# Patient Record
Sex: Male | Born: 1935 | Race: White | Hispanic: No | Marital: Married | Smoking: Never smoker
Health system: Southern US, Community
[De-identification: ages and names within clinical notes are randomized; demographics above are authoritative.]

## PROBLEM LIST (undated history)

## (undated) DIAGNOSIS — N529 Male erectile dysfunction, unspecified: Secondary | ICD-10-CM

## (undated) DIAGNOSIS — M519 Unspecified thoracic, thoracolumbar and lumbosacral intervertebral disc disorder: Secondary | ICD-10-CM

## (undated) DIAGNOSIS — I10 Essential (primary) hypertension: Secondary | ICD-10-CM

## (undated) DIAGNOSIS — I779 Disorder of arteries and arterioles, unspecified: Secondary | ICD-10-CM

## (undated) DIAGNOSIS — E119 Type 2 diabetes mellitus without complications: Secondary | ICD-10-CM

## (undated) DIAGNOSIS — I341 Nonrheumatic mitral (valve) prolapse: Secondary | ICD-10-CM

## (undated) DIAGNOSIS — I739 Peripheral vascular disease, unspecified: Secondary | ICD-10-CM

## (undated) DIAGNOSIS — E785 Hyperlipidemia, unspecified: Secondary | ICD-10-CM

## (undated) DIAGNOSIS — I340 Nonrheumatic mitral (valve) insufficiency: Principal | ICD-10-CM

## (undated) HISTORY — PX: CARDIAC CATHETERIZATION: SHX172

## (undated) HISTORY — DX: Unspecified thoracic, thoracolumbar and lumbosacral intervertebral disc disorder: M51.9

## (undated) HISTORY — DX: Hyperlipidemia, unspecified: E78.5

## (undated) HISTORY — DX: Nonrheumatic mitral (valve) insufficiency: I34.0

## (undated) HISTORY — DX: Peripheral vascular disease, unspecified: I73.9

## (undated) HISTORY — DX: Disorder of arteries and arterioles, unspecified: I77.9

## (undated) HISTORY — DX: Male erectile dysfunction, unspecified: N52.9

## (undated) HISTORY — DX: Essential (primary) hypertension: I10

## (undated) HISTORY — DX: Nonrheumatic mitral (valve) prolapse: I34.1

---

## 2004-01-20 ENCOUNTER — Ambulatory Visit (HOSPITAL_COMMUNITY): Admission: RE | Admit: 2004-01-20 | Discharge: 2004-01-20 | Payer: Self-pay | Admitting: General Surgery

## 2005-05-17 ENCOUNTER — Ambulatory Visit (HOSPITAL_COMMUNITY): Admission: RE | Admit: 2005-05-17 | Discharge: 2005-05-17 | Payer: Self-pay | Admitting: *Deleted

## 2008-10-08 ENCOUNTER — Encounter: Admission: RE | Admit: 2008-10-08 | Discharge: 2008-10-08 | Payer: Self-pay | Admitting: Internal Medicine

## 2010-09-30 NOTE — Op Note (Signed)
NAME:  Adrian Olson, AUTHEMENT NO.:  [id number]   MEDICAL RECORD NO.:  [id number]                   PATIENT TYPE:  OIB   LOCATION:  2889                                 FACILITY:  [facility]   PHYSICIAN:  Leonie Man, M.D.                DATE OF BIRTH:  18-May-1930   DATE OF PROCEDURE:  01/20/2004  DATE OF DISCHARGE:                                 OPERATIVE REPORT   PREOPERATIVE DIAGNOSIS:  Left inguinal hernia.   POSTOPERATIVE DIAGNOSIS:  Left inguinal hernia.   PROCEDURE:  Left inguinal herniorrhaphy with mesh.   SURGEON:  Leonie Man, M.D.   ASSISTANT:  Nurse.   ANESTHESIA:  General.   NOTE:  Mr. Eisenbeis is a 77 year old man presenting with a left-sided groin  bulge which easily reduces on standing.  He desires to have his hernia  fixed.  The risks and potential benefits of surgery have been discussed, all  questions answered, and consent obtained.   DESCRIPTION OF PROCEDURE:  Following induction of satisfactory general  anesthesia with the patient position supinely, the left groin is prepped and  draped including to be included in sterile operative field.  I infiltrated  the region of the lower abdominal crease with 0.5% Marcaine with  epinephrine.  A transverse incision is made in the lower abdominal crease,  leading through the skin and subcutaneous tissue, carrying dissection down  through the external oblique aponeurosis.  External oblique aponeurosis  opened up through the external inguinal ring.  The spermatic cord was then  elevated and held with a Penrose drain.  Dissection along the anteromedial  aspect of the spermatic cord did not show any evidence of an indirect  hernia.  A large direct hernia was noted and was dissected free from the  retroperitoneal structures.  There was no indirect sac noted on the  anteromedial aspect of the cord.  There was a large direct hernia noted.  This was repaired with patch of polypropylene mesh sewn in  the pubic  tubercle and carried up along the conjoin tendon with a 2-0 novofil suture  and again from the pubic tubercle up along the shelving edge of Poupart's  ligament with a 2-0 Novofil suture. The tails of the mesh were split so as  to allow protrusion of the cord.  The tails of the mesh then sutured into  the posterior behind the cord in the internal oblique muscles.  The hernia  repair was checked and noted to be strong and firm. Sponge, instruments, and  sharp counts were verified.  The external oblique aponeurosis closed over  the cord with a running 2-0 Vicryl suture.  Subcutaneous tissue closed with running 3-0 Vicryl suture, and skin closed  with running 4-0 Monocryl suture and then reinforced with Steri-Strips.  Sterile dressings applied.  Anesthetic reversed, patient removed from the  operating room to the recovery room in stable condition.  He  tolerated the  procedure well.                                               Leonie Man, M.D.    PB/MEDQ  D:  01/20/2004  T:  01/20/2004  Job:  [id number]   cc:   Janae Bridgeman. Eloise Harman., M.D.  [address]  [place]  [place] [address]  Fax: [phone]

## 2010-09-30 NOTE — Op Note (Signed)
NAMEMARCIAL, PLESS NO.:  [id number]   MEDICAL RECORD NO.:  [id number]          PATIENT TYPE:  AMB   LOCATION:  ENDO                         FACILITY:  [facility]   PHYSICIAN:  Georgiana Spinner, M.D.    DATE OF BIRTH:  06-May-1931   DATE OF PROCEDURE:  05/17/2005  DATE OF DISCHARGE:                                 OPERATIVE REPORT   PROCEDURE:  Colonoscopy.   INDICATIONS FOR PROCEDURE:  Colon cancer screening.   ANESTHESIA:  Demerol 70, Versed 7 mg.   DESCRIPTION OF PROCEDURE:  With the patient mildly sedated in the left  lateral decubitus position, a rectal exam was performed which revealed an  enlarged prostate without nodules. Subsequently, the Olympus videoscopic  colonoscope was inserted into the rectum and passed under direct vision to  the cecum identified by the ileocecal valve and base of the cecum both of  which were photographed. From this point, the colonoscope was slowly  withdrawn taking circumferential views of the colonic mucosa stopping only  in the rectum which appeared normal on direct and showed hemorrhoids on  retroflexed view. The endoscope was straightened and withdrawn. The  patient's vital signs and pulse oximeter remained stable. The patient  tolerated the procedure well without apparent complications.   FINDINGS:  Internal hemorrhoids, diverticulosis of sigmoid colon otherwise  an unremarkable colonoscopic examination to the cecum.   PLAN:  Consider repeat examination possibly in 5 years           ______________________________  Georgiana Spinner, M.D.     GMO/MEDQ  D:  05/17/2005  T:  05/17/2005  Job:  [id number]

## 2013-03-12 ENCOUNTER — Encounter (HOSPITAL_COMMUNITY): Payer: Self-pay | Admitting: Interventional Cardiology

## 2013-03-12 ENCOUNTER — Telehealth: Payer: Self-pay

## 2013-03-12 DIAGNOSIS — I340 Nonrheumatic mitral (valve) insufficiency: Secondary | ICD-10-CM

## 2013-03-12 NOTE — Telephone Encounter (Signed)
Message copied by Jarvis Newcomer on Wed Mar 12, 2013  4:19 PM ------      Message from: Verdis Prime      Created: Wed Mar 12, 2013  2:45 PM       No evidence of cardiac enlargement. Leaky valves is unchanged from prior study. He needs clinical followup in one year. He needs to have an echocardiogram done prior to the visit for mitral regurgitation ------

## 2013-03-12 NOTE — Telephone Encounter (Signed)
pt aware of echo No evidence of cardiac enlargement. Leaky valves is unchanged from prior study. repeat echo in 1 yr prior to 48yr f/u with Dr.Smith.pt verbalized understanding.

## 2013-03-12 NOTE — Telephone Encounter (Signed)
Message copied by Jarvis Newcomer on Wed Mar 12, 2013  4:15 PM ------      Message from: Verdis Prime      Created: Wed Mar 12, 2013  2:45 PM       No evidence of cardiac enlargement. Leaky valves is unchanged from prior study. He needs clinical followup in one year. He needs to have an echocardiogram done prior to the visit for mitral regurgitation ------

## 2013-05-28 ENCOUNTER — Other Ambulatory Visit: Payer: Self-pay | Admitting: Interventional Cardiology

## 2013-05-28 DIAGNOSIS — I6529 Occlusion and stenosis of unspecified carotid artery: Secondary | ICD-10-CM

## 2013-05-29 ENCOUNTER — Encounter: Payer: Self-pay | Admitting: Cardiology

## 2013-05-29 ENCOUNTER — Ambulatory Visit (HOSPITAL_COMMUNITY): Payer: Medicare Other | Attending: Cardiology

## 2013-05-29 DIAGNOSIS — I6529 Occlusion and stenosis of unspecified carotid artery: Secondary | ICD-10-CM | POA: Insufficient documentation

## 2013-05-29 DIAGNOSIS — I658 Occlusion and stenosis of other precerebral arteries: Secondary | ICD-10-CM | POA: Insufficient documentation

## 2013-05-29 DIAGNOSIS — E785 Hyperlipidemia, unspecified: Secondary | ICD-10-CM | POA: Insufficient documentation

## 2013-05-29 DIAGNOSIS — I10 Essential (primary) hypertension: Secondary | ICD-10-CM | POA: Insufficient documentation

## 2013-06-03 ENCOUNTER — Telehealth: Payer: Self-pay

## 2013-06-03 DIAGNOSIS — I6529 Occlusion and stenosis of unspecified carotid artery: Secondary | ICD-10-CM

## 2013-06-03 NOTE — Telephone Encounter (Signed)
pt given results of carotid dopplers.Moderate 60-75% stenoses bilateral. Slight increase since prior. Repeat 6 months.pt verbalzied understanding.

## 2013-06-03 NOTE — Telephone Encounter (Signed)
Message copied by Lamar Laundry on Tue Jun 03, 2013  2:51 PM ------      Message from: Daneen Schick      Created: Tue Jun 03, 2013  7:41 AM       Moderate 60-75% stenoses bilateral. Slight increase since prior. Repeat 6 months. ------

## 2013-06-11 ENCOUNTER — Encounter: Payer: Self-pay | Admitting: Interventional Cardiology

## 2013-12-04 ENCOUNTER — Ambulatory Visit (HOSPITAL_COMMUNITY): Payer: Medicare Other | Attending: Interventional Cardiology | Admitting: Cardiology

## 2013-12-04 DIAGNOSIS — E785 Hyperlipidemia, unspecified: Secondary | ICD-10-CM | POA: Insufficient documentation

## 2013-12-04 DIAGNOSIS — R7309 Other abnormal glucose: Secondary | ICD-10-CM | POA: Diagnosis not present

## 2013-12-04 DIAGNOSIS — I6529 Occlusion and stenosis of unspecified carotid artery: Secondary | ICD-10-CM | POA: Diagnosis not present

## 2013-12-04 DIAGNOSIS — I10 Essential (primary) hypertension: Secondary | ICD-10-CM | POA: Diagnosis not present

## 2013-12-04 NOTE — Progress Notes (Signed)
Carotid duplex performed 

## 2013-12-05 ENCOUNTER — Telehealth: Payer: Self-pay

## 2013-12-05 DIAGNOSIS — I6523 Occlusion and stenosis of bilateral carotid arteries: Secondary | ICD-10-CM

## 2013-12-05 NOTE — Telephone Encounter (Signed)
pt aware of carotid results.Stable bilateral carotid obstruction between 60 and 79%. It is recommended that carotid Doppler be repeated in 6 months. pt verbalized understanding.

## 2013-12-05 NOTE — Telephone Encounter (Signed)
Message copied by Lamar Laundry on Fri Dec 05, 2013  1:55 PM ------      Message from: Daneen Schick      Created: Fri Dec 05, 2013  1:12 PM       Stable bilateral carotid obstruction between 60 and 79%. It is recommended that carotid Doppler be repeated in 6 months ------

## 2013-12-09 ENCOUNTER — Encounter: Payer: Self-pay | Admitting: Interventional Cardiology

## 2013-12-27 ENCOUNTER — Encounter: Payer: Self-pay | Admitting: *Deleted

## 2014-03-10 ENCOUNTER — Ambulatory Visit (HOSPITAL_COMMUNITY): Payer: Medicare Other | Attending: Internal Medicine | Admitting: Radiology

## 2014-03-10 DIAGNOSIS — I340 Nonrheumatic mitral (valve) insufficiency: Secondary | ICD-10-CM | POA: Diagnosis not present

## 2014-03-10 NOTE — Progress Notes (Signed)
Echocardiogram performed.  

## 2014-03-16 ENCOUNTER — Encounter: Payer: Self-pay | Admitting: Interventional Cardiology

## 2014-03-16 ENCOUNTER — Ambulatory Visit (INDEPENDENT_AMBULATORY_CARE_PROVIDER_SITE_OTHER): Payer: Medicare Other | Admitting: Interventional Cardiology

## 2014-03-16 VITALS — BP 150/66 | HR 56 | Ht 68.0 in | Wt 149.0 lb

## 2014-03-16 DIAGNOSIS — I739 Peripheral vascular disease, unspecified: Secondary | ICD-10-CM

## 2014-03-16 DIAGNOSIS — I340 Nonrheumatic mitral (valve) insufficiency: Secondary | ICD-10-CM

## 2014-03-16 DIAGNOSIS — E785 Hyperlipidemia, unspecified: Secondary | ICD-10-CM

## 2014-03-16 DIAGNOSIS — N529 Male erectile dysfunction, unspecified: Secondary | ICD-10-CM | POA: Insufficient documentation

## 2014-03-16 DIAGNOSIS — I341 Nonrheumatic mitral (valve) prolapse: Secondary | ICD-10-CM

## 2014-03-16 DIAGNOSIS — I10 Essential (primary) hypertension: Secondary | ICD-10-CM

## 2014-03-16 DIAGNOSIS — I779 Disorder of arteries and arterioles, unspecified: Secondary | ICD-10-CM

## 2014-03-16 DIAGNOSIS — N5201 Erectile dysfunction due to arterial insufficiency: Secondary | ICD-10-CM

## 2014-03-16 HISTORY — DX: Nonrheumatic mitral (valve) insufficiency: I34.0

## 2014-03-16 NOTE — Progress Notes (Signed)
Patient ID: Adrian Olson, male   DOB: [birth year], 80 y.o.   MRN: [id number]    [address], [place]  [address] Phone: [phone] Fax:  [phone]  Date:  03/16/2014   ID:  Adrian Olson, DOB Jan 03, 1931, MRN [id number]  PCP:  [name], MD   ASSESSMENT:  1. Mitral valve prolapse with severe mitral regurgitation 2. Hypertension, essential, with borderline control 3. Asymptomatic bilateral carotid disease  PLAN:  1. Cautioned to call if fatigue, dyspnea on exertion, orthopnea, palpitations 2. 2-D Doppler echocardiogram 1 year   SUBJECTIVE: Adrian Olson is a 80 y.o. male was doing well. His only complaint is erectile dysfunction. He denies orthopnea, PND, exertional dyspnea, exertional fatigue, palpitations, syncope, chest pain, and peripheral edema. He has not had chills or fever.   Wt Readings from Last 3 Encounters:  03/16/14 149 lb (67.586 kg)     Past Medical History  Diagnosis Date  . Mitral valve prolapse     moderate to severe MR, LVEF 55% Echo 2010  . Bilateral carotid artery disease     Severe Left and moderate Right  . Hypertension   . Lumbar disc disease   . Hyperlipidemia   . ED (erectile dysfunction)     Current Outpatient Prescriptions  Medication Sig Dispense Refill  . aspirin 81 MG tablet Take 81 mg by mouth daily.    Jolyne Loa Grape-Goldenseal (BERBERINE COMPLEX PO) Take 400 mg by mouth daily.    . Cholecalciferol (VITAMIN D) 2000 UNITS tablet Take 2,000 Units by mouth daily.    . finasteride (PROSCAR) 5 MG tablet Take 5 mg by mouth daily.    . hydrochlorothiazide (MICROZIDE) 12.5 MG capsule Take 12.5 mg by mouth daily.    Marland Kitchen levothyroxine (SYNTHROID, LEVOTHROID) 75 MCG tablet Take 75 mcg by mouth daily before breakfast.    . pravastatin (PRAVACHOL) 20 MG tablet Take 20 mg by mouth daily.    . ramipril (ALTACE) 5 MG capsule Take 5 mg by mouth daily.    . tamsulosin (FLOMAX) 0.4 MG CAPS capsule Take 0.4 mg by  mouth.     No current facility-administered medications for this visit.    Allergies:   No Known Allergies  Social History:  The patient  reports that he has never smoked. He does not have any smokeless tobacco history on file. He reports that he does not drink alcohol or use illicit drugs.   ROS:  Please see the history of present illness.   Appetite is been stable. Medication compliance is advocated.   All other systems reviewed and negative.   OBJECTIVE: VS:  BP 150/66 mmHg  Pulse 56  Ht 5\' 8"  (1.727 m)  Wt 149 lb (67.586 kg)  BMI 22.66 kg/m2 Well nourished, well developed, in no acute distress, slender and young and his stated age in appearance HEENT: normal Neck: JVD flat with the patient lying at 30. Carotid bruit absent although there is a transmitted sound related to superiorly directed mitral regurgitation  Cardiac:  normal S1, S2; RRR; 3 to 4/6 holosystolic murmur of mitral regurgitation Lungs:  clear to auscultation bilaterally, no wheezing, rhonchi or rales Abd: soft, nontender, no hepatomegaly Ext: Edema absent. Pulses 2+ Skin: warm and dry Neuro:  CNs 2-12 intact, no focal abnormalities noted  EKG:  Normal sinus rhythm with sinus bradycardia and premature atrial contraction     ECHOCARDIOGRAM, October, 2015: the echocardiogram when compared with the last report from [facility] cardiology  reveals a mild increase in LV end-diastolic dimension going from 4.8 cm to 5.3 cm. Left atrial size is unchanged. Systolic function is unchanged with an estimated ejection fraction of 60%.  Study Conclusions  - Left ventricle: The cavity size was normal. Wall thickness was normal. Systolic function was normal. The estimated ejection fraction was in the range of 55% to 60%. Wall motion was normal; there were no regional wall motion abnormalities. Features are consistent with a pseudonormal left ventricular filling pattern, with concomitant abnormal relaxation and increased  filling pressure (grade 2 diastolic dysfunction). - Aortic valve: There was mild regurgitation. - Mitral valve: Severe prolapse, involving the posterior leaflet. There was severe regurgitation directed eccentrically and anteriorly. Severe regurgitation is suggested by a peak E velocity >= 150 cm/s. - Left atrium: The atrium was moderately to severely dilated. - Pulmonary arteries: Systolic pressure was mildly increased. Signed, Illene Labrador III, MD 03/16/2014 3:54 PM

## 2014-03-16 NOTE — Patient Instructions (Signed)
Your physician recommends that you continue on your current medications as directed. Please refer to the Current Medication list given to you today.  Your physician has requested that you have an echocardiogram. Echocardiography is a painless test that uses sound waves to create images of your heart. It provides your doctor with information about the size and shape of your heart and how well your heart's chambers and valves are working. This procedure takes approximately one hour. There are no restrictions for this procedure.( To be performed before follow up appointment in Nov 2016)   Your physician wants you to follow-up in: 1 year with Dr.Smith You will receive a reminder letter in the mail two months in advance. If you don't receive a letter, please call our office to schedule the follow-up appointment.

## 2014-06-12 ENCOUNTER — Encounter: Payer: Self-pay | Admitting: Interventional Cardiology

## 2014-07-03 ENCOUNTER — Ambulatory Visit (HOSPITAL_COMMUNITY): Payer: PPO | Attending: Cardiovascular Disease | Admitting: Cardiology

## 2014-07-03 DIAGNOSIS — R7309 Other abnormal glucose: Secondary | ICD-10-CM | POA: Diagnosis not present

## 2014-07-03 DIAGNOSIS — I6523 Occlusion and stenosis of bilateral carotid arteries: Secondary | ICD-10-CM

## 2014-07-03 DIAGNOSIS — E785 Hyperlipidemia, unspecified: Secondary | ICD-10-CM | POA: Diagnosis not present

## 2014-07-03 DIAGNOSIS — I10 Essential (primary) hypertension: Secondary | ICD-10-CM | POA: Diagnosis not present

## 2014-07-03 NOTE — Progress Notes (Signed)
Carotid duplex performed 

## 2014-07-07 ENCOUNTER — Telehealth: Payer: Self-pay

## 2014-07-07 NOTE — Telephone Encounter (Signed)
-----   Message from [name], MD sent at 07/06/2014  7:19 PM EST ----- Stable <79% bilateral obstruction. Repeat 1 year.

## 2014-07-07 NOTE — Telephone Encounter (Signed)
Pt aware of carotid results.Stable <79% bilateral obstruction. Repeat 1 year.pt verbalized understanding.

## 2015-04-27 ENCOUNTER — Ambulatory Visit (HOSPITAL_COMMUNITY): Payer: PPO | Attending: Cardiology

## 2015-04-27 ENCOUNTER — Other Ambulatory Visit: Payer: Self-pay

## 2015-04-27 ENCOUNTER — Other Ambulatory Visit: Payer: Self-pay | Admitting: Interventional Cardiology

## 2015-04-27 DIAGNOSIS — I351 Nonrheumatic aortic (valve) insufficiency: Secondary | ICD-10-CM | POA: Diagnosis not present

## 2015-04-27 DIAGNOSIS — I341 Nonrheumatic mitral (valve) prolapse: Secondary | ICD-10-CM

## 2015-04-27 DIAGNOSIS — I5189 Other ill-defined heart diseases: Secondary | ICD-10-CM | POA: Insufficient documentation

## 2015-04-27 DIAGNOSIS — I517 Cardiomegaly: Secondary | ICD-10-CM | POA: Diagnosis not present

## 2015-04-27 DIAGNOSIS — I340 Nonrheumatic mitral (valve) insufficiency: Secondary | ICD-10-CM | POA: Insufficient documentation

## 2015-04-27 DIAGNOSIS — I509 Heart failure, unspecified: Secondary | ICD-10-CM | POA: Diagnosis present

## 2015-05-03 NOTE — Progress Notes (Signed)
Cardiology Office Note   Date:  05/04/2015   ID:  Adrian Olson, DOB 01-Jun-1930, MRN [id number]  PCP:  [name], MD  Cardiologist:  [name], MD   Chief Complaint  Patient presents with  . Cardiac Valve Problem      History of Present Illness: Adrian Olson is a 81 y.o. male who presents for  Follow-up of mitral valve prolapse and regurgitation.   Mr. [name] is doing well. He walks 3 miles daily. There is perhaps slightly more fatigued towards the end of the walk now than a year ago. He denies orthopnea, PND, palpitations, and syncope. There is no peripheral edema. He denies orthopnea and palpitations. He has not been bothered by chest pain. His echo is unchanged compared to a year ago with the suggestion of moderate to severe mitral regurgitation, normal left ventricular size and function, mild left atrial enlargement, and no evidence of pulmonary hypertension.    Past Medical History  Diagnosis Date  . Mitral valve prolapse     moderate to severe MR, LVEF 55% Echo 2010  . Bilateral carotid artery disease (HCC)     Severe Left and moderate Right  . Hypertension   . Lumbar disc disease   . Hyperlipidemia   . ED (erectile dysfunction)     No past surgical history on file.   Current Outpatient Prescriptions  Medication Sig Dispense Refill  . aspirin 81 MG tablet Take 81 mg by mouth daily.    . Cholecalciferol (VITAMIN D) 2000 UNITS tablet Take 2,000 Units by mouth daily.    . finasteride (PROSCAR) 5 MG tablet Take 5 mg by mouth daily.    . hydrochlorothiazide (MICROZIDE) 12.5 MG capsule Take 12.5 mg by mouth daily.    Marland Kitchen levothyroxine (SYNTHROID, LEVOTHROID) 75 MCG tablet Take 75 mcg by mouth daily before breakfast.    . metFORMIN (GLUCOPHAGE) 500 MG tablet Take 250 mg by mouth daily with breakfast.    . pravastatin (PRAVACHOL) 20 MG tablet Take 20 mg by mouth daily.    . ramipril (ALTACE) 5 MG capsule Take 5 mg by mouth daily.    . tamsulosin (FLOMAX)  0.4 MG CAPS capsule Take 0.4 mg by mouth.     No current facility-administered medications for this visit.    Allergies:   Review of patient's allergies indicates no known allergies.    Social History:  The patient  reports that he has never smoked. He has never used smokeless tobacco. He reports that he does not drink alcohol or use illicit drugs.   Family History:  The patient's family history includes Cancer in his mother; Dementia in his sister; Diabetes in his father and sister; Heart disease in his father and sister; Obesity in his sister; Other in his father and sister; Stroke in his father.    ROS:  Please see the history of present illness.   Otherwise, review of systems are positive for  Anxiety , easy bruising, hearing loss , and joint discomfort..   All other systems are reviewed and negative.    PHYSICAL EXAM: VS:  BP 140/60 mmHg  Pulse 57  Ht 5\' 9"  (1.753 m)  Wt 144 lb 1.9 oz (65.372 kg)  BMI 21.27 kg/m2 , BMI Body mass index is 21.27 kg/(m^2). GEN: Well nourished, well developed, in no acute distress HEENT: normal Neck: no JVD,   There Is a faint left carotid bruit.  No masses Cardiac: RRR.  There is  Mid to  late late peaking systolic murmur of mitral regurgitation.  No gallop or rub is heard. There is no edema. Respiratory:  clear to auscultation bilaterally, normal work of breathing. GI: soft, nontender, nondistended, + BS MS: no deformity or atrophy Skin: warm and dry, no rash Neuro:  Strength and sensation are intact Psych: euthymic mood, full affect   EKG:  EKG is ordered today. The ekg reveals  Normal sinus rhythm with interventricular conduction delay.   Recent Labs: No results found for requested labs within last 365 days.    Lipid Panel No results found for: CHOL, TRIG, HDL, CHOLHDL, VLDL, LDLCALC, LDLDIRECT    Wt Readings from Last 3 Encounters:  05/04/15 144 lb 1.9 oz (65.372 kg)  03/16/14 149 lb (67.586 kg)      Other studies  Reviewed: Additional studies/ records that were reviewed today include:  Review of due to prior echo studies.. The findings include  There is no evidence of LV enlargement or change in severity of MR when the studies are compared..    ASSESSMENT AND PLAN:  1.  Severe mitral regurgitation  There is no change in the echocardiographic appearance. There is no change in exertional tolerance.  2. Essential hypertension  controlled.  3. Hyperlipidemia  currently on therapy including pravastatin.  4. Carotid stenosis, bilateral  left carotid bruit is heard    Current medicines are reviewed at length with the patient today.  The patient has the following concerns regarding medicines:  none.  The following changes/actions have been instituted:     we discussed mitral valve repair. We have made the decision that in the absence of progression based upon heart size /chamber size and absence of any significant symptoms , we will continue to follow clinically.   He is notified that if he develops tachycardia , dyspnea, orthopnea, chest pain, lower extremity edema, or any other evidence of cardiac decompensation , he should call immediately.   Will repeat an echo in 1-1.5 years. We'll see him in the office for neck she has visited and determine when to perform the echo.  Labs/ tests ordered today include:  No orders of the defined types were placed in this encounter.     Disposition:   FU with HS in 1 year  Signed, [name], MD  05/04/2015 11:12 AM    [address], [place], [place]  [address] Phone: [phone]; Fax: [phone]

## 2015-05-04 ENCOUNTER — Ambulatory Visit (INDEPENDENT_AMBULATORY_CARE_PROVIDER_SITE_OTHER): Payer: PPO | Admitting: Interventional Cardiology

## 2015-05-04 ENCOUNTER — Encounter: Payer: Self-pay | Admitting: Interventional Cardiology

## 2015-05-04 VITALS — BP 140/60 | HR 57 | Ht 69.0 in | Wt 144.1 lb

## 2015-05-04 DIAGNOSIS — I340 Nonrheumatic mitral (valve) insufficiency: Secondary | ICD-10-CM | POA: Diagnosis not present

## 2015-05-04 DIAGNOSIS — E785 Hyperlipidemia, unspecified: Secondary | ICD-10-CM | POA: Diagnosis not present

## 2015-05-04 DIAGNOSIS — I6523 Occlusion and stenosis of bilateral carotid arteries: Secondary | ICD-10-CM

## 2015-05-04 DIAGNOSIS — I10 Essential (primary) hypertension: Secondary | ICD-10-CM

## 2015-05-04 NOTE — Patient Instructions (Signed)

## 2015-05-28 DIAGNOSIS — R972 Elevated prostate specific antigen [PSA]: Secondary | ICD-10-CM | POA: Diagnosis not present

## 2015-05-28 DIAGNOSIS — N401 Enlarged prostate with lower urinary tract symptoms: Secondary | ICD-10-CM | POA: Diagnosis not present

## 2015-05-28 DIAGNOSIS — Z0000 Encounter for general adult medical examination without abnormal findings: Secondary | ICD-10-CM | POA: Diagnosis not present

## 2015-05-28 DIAGNOSIS — N5201 Erectile dysfunction due to arterial insufficiency: Secondary | ICD-10-CM | POA: Diagnosis not present

## 2015-05-28 DIAGNOSIS — R351 Nocturia: Secondary | ICD-10-CM | POA: Diagnosis not present

## 2015-06-01 ENCOUNTER — Telehealth: Payer: Self-pay | Admitting: Interventional Cardiology

## 2015-06-01 DIAGNOSIS — I6523 Occlusion and stenosis of bilateral carotid arteries: Secondary | ICD-10-CM

## 2015-06-01 NOTE — Telephone Encounter (Signed)
New Message  Pt calling to speak w/ RN- wanted to know if he needs yearly carotid- no order in syst or in previous OV note. Please call back and discuss.

## 2015-06-01 NOTE — Telephone Encounter (Signed)
Returned pt call. Adv pt that he id sue to have his carotid duplex repeated in Feb 2017. Adv pt tha I will fwd a message to pcc to call pt to schedule. Pt voiced appreciation and verbalized understanding.

## 2015-07-05 ENCOUNTER — Ambulatory Visit (HOSPITAL_COMMUNITY)
Admission: RE | Admit: 2015-07-05 | Discharge: 2015-07-05 | Disposition: A | Discharge status: Home / Self Care | Payer: PPO | Source: Ambulatory Visit | Attending: Cardiology | Admitting: Cardiology

## 2015-07-05 ENCOUNTER — Encounter (HOSPITAL_COMMUNITY): Payer: PPO

## 2015-07-05 DIAGNOSIS — I10 Essential (primary) hypertension: Secondary | ICD-10-CM | POA: Diagnosis not present

## 2015-07-05 DIAGNOSIS — E785 Hyperlipidemia, unspecified: Secondary | ICD-10-CM | POA: Insufficient documentation

## 2015-07-05 DIAGNOSIS — I6523 Occlusion and stenosis of bilateral carotid arteries: Secondary | ICD-10-CM | POA: Diagnosis not present

## 2015-07-08 ENCOUNTER — Other Ambulatory Visit: Payer: Self-pay

## 2015-07-08 DIAGNOSIS — I6523 Occlusion and stenosis of bilateral carotid arteries: Secondary | ICD-10-CM

## 2015-07-20 DIAGNOSIS — L821 Other seborrheic keratosis: Secondary | ICD-10-CM | POA: Diagnosis not present

## 2015-07-20 DIAGNOSIS — L84 Corns and callosities: Secondary | ICD-10-CM | POA: Diagnosis not present

## 2015-08-09 DIAGNOSIS — Z0100 Encounter for examination of eyes and vision without abnormal findings: Secondary | ICD-10-CM | POA: Diagnosis not present

## 2015-08-09 DIAGNOSIS — Z961 Presence of intraocular lens: Secondary | ICD-10-CM | POA: Diagnosis not present

## 2015-08-09 DIAGNOSIS — H2512 Age-related nuclear cataract, left eye: Secondary | ICD-10-CM | POA: Diagnosis not present

## 2015-09-20 DIAGNOSIS — R739 Hyperglycemia, unspecified: Secondary | ICD-10-CM | POA: Diagnosis not present

## 2015-09-20 DIAGNOSIS — E7800 Pure hypercholesterolemia, unspecified: Secondary | ICD-10-CM | POA: Diagnosis not present

## 2015-09-20 DIAGNOSIS — Z0000 Encounter for general adult medical examination without abnormal findings: Secondary | ICD-10-CM | POA: Diagnosis not present

## 2015-09-20 DIAGNOSIS — E039 Hypothyroidism, unspecified: Secondary | ICD-10-CM | POA: Diagnosis not present

## 2015-09-28 DIAGNOSIS — D649 Anemia, unspecified: Secondary | ICD-10-CM | POA: Diagnosis not present

## 2015-09-28 DIAGNOSIS — E039 Hypothyroidism, unspecified: Secondary | ICD-10-CM | POA: Diagnosis not present

## 2015-09-28 DIAGNOSIS — E7800 Pure hypercholesterolemia, unspecified: Secondary | ICD-10-CM | POA: Diagnosis not present

## 2015-09-28 DIAGNOSIS — R739 Hyperglycemia, unspecified: Secondary | ICD-10-CM | POA: Diagnosis not present

## 2015-09-29 ENCOUNTER — Encounter: Payer: Self-pay | Admitting: Interventional Cardiology

## 2015-11-09 DIAGNOSIS — D225 Melanocytic nevi of trunk: Secondary | ICD-10-CM | POA: Diagnosis not present

## 2015-11-09 DIAGNOSIS — L814 Other melanin hyperpigmentation: Secondary | ICD-10-CM | POA: Diagnosis not present

## 2015-11-09 DIAGNOSIS — L821 Other seborrheic keratosis: Secondary | ICD-10-CM | POA: Diagnosis not present

## 2016-02-25 DIAGNOSIS — Z23 Encounter for immunization: Secondary | ICD-10-CM | POA: Diagnosis not present

## 2016-03-27 DIAGNOSIS — R739 Hyperglycemia, unspecified: Secondary | ICD-10-CM | POA: Diagnosis not present

## 2016-03-27 DIAGNOSIS — E7800 Pure hypercholesterolemia, unspecified: Secondary | ICD-10-CM | POA: Diagnosis not present

## 2016-03-27 DIAGNOSIS — D649 Anemia, unspecified: Secondary | ICD-10-CM | POA: Diagnosis not present

## 2016-03-27 DIAGNOSIS — E039 Hypothyroidism, unspecified: Secondary | ICD-10-CM | POA: Diagnosis not present

## 2016-04-12 DIAGNOSIS — E7800 Pure hypercholesterolemia, unspecified: Secondary | ICD-10-CM | POA: Diagnosis not present

## 2016-04-12 DIAGNOSIS — R739 Hyperglycemia, unspecified: Secondary | ICD-10-CM | POA: Diagnosis not present

## 2016-04-12 DIAGNOSIS — D649 Anemia, unspecified: Secondary | ICD-10-CM | POA: Diagnosis not present

## 2016-04-12 DIAGNOSIS — E039 Hypothyroidism, unspecified: Secondary | ICD-10-CM | POA: Diagnosis not present

## 2016-05-22 ENCOUNTER — Encounter: Payer: Self-pay | Admitting: Interventional Cardiology

## 2016-05-22 ENCOUNTER — Ambulatory Visit (INDEPENDENT_AMBULATORY_CARE_PROVIDER_SITE_OTHER): Payer: PPO | Admitting: Interventional Cardiology

## 2016-05-22 VITALS — BP 118/70 | HR 61 | Ht 69.0 in | Wt 144.4 lb

## 2016-05-22 DIAGNOSIS — I739 Peripheral vascular disease, unspecified: Secondary | ICD-10-CM

## 2016-05-22 DIAGNOSIS — I341 Nonrheumatic mitral (valve) prolapse: Secondary | ICD-10-CM

## 2016-05-22 DIAGNOSIS — I779 Disorder of arteries and arterioles, unspecified: Secondary | ICD-10-CM

## 2016-05-22 DIAGNOSIS — I340 Nonrheumatic mitral (valve) insufficiency: Secondary | ICD-10-CM | POA: Diagnosis not present

## 2016-05-22 DIAGNOSIS — E784 Other hyperlipidemia: Secondary | ICD-10-CM

## 2016-05-22 DIAGNOSIS — I10 Essential (primary) hypertension: Secondary | ICD-10-CM

## 2016-05-22 DIAGNOSIS — E7849 Other hyperlipidemia: Secondary | ICD-10-CM

## 2016-05-22 NOTE — Patient Instructions (Addendum)
Medication Instructions:  None  Labwork: None  Testing/Procedures: Your physician has requested that you have a carotid duplex after 06/20/16. This test is an ultrasound of the carotid arteries in your neck. It looks at blood flow through these arteries that supply the brain with blood. Allow one hour for this exam. There are no restrictions or special instructions.  Your physician has requested that you have an echocardiogram in Dec. '18 or Jan. '19 prior to seeing Dr. Tamala Julian.  Echocardiography is a painless test that uses sound waves to create images of your heart. It provides your doctor with information about the size and shape of your heart and how well your heart's chambers and valves are working. This procedure takes approximately one hour. There are no restrictions for this procedure.    Follow-Up: Your physician wants you to follow-up in: 1 year with Dr. Tamala Julian with echo just prior to visit. You will receive a reminder letter in the mail two months in advance. If you don't receive a letter, please call our office to schedule the follow-up appointment.   Any Other Special Instructions Will Be Listed Below (If Applicable).     If you need a refill on your cardiac medications before your next appointment, please call your pharmacy.

## 2016-05-22 NOTE — Progress Notes (Signed)
Cardiology Office Note    Date:  05/22/2016   ID:  Adrian Olson, DOB [birth year], MRN [id number]  PCP:  [name], MD  Cardiologist: [name], MD   Chief Complaint  Patient presents with  . Cardiac Valve Problem    MVP    History of Present Illness:  Adrian Olson is a 83 y.o. male degenerative mitral valve disease, mitral regurgitation, hypertension, and bilateral carotid artery disease.  Asymptomatic. Walking 3 miles 5-6 times per week. He is able to complete the 3 miles and 45 minutes. No dyspnea, orthopnea, PND, lower extremity swelling, or chest discomfort. No episodes of prolonged palpitation. No history of atrial fibrillation.  Past Medical History:  Diagnosis Date  . Bilateral carotid artery disease (HCC)    Severe Left and moderate Right  . ED (erectile dysfunction)   . Hyperlipidemia   . Hypertension   . Lumbar disc disease   . Mitral valve prolapse    moderate to severe MR, LVEF 55% Echo 2010    No past surgical history on file.  Current Medications: Outpatient Medications Prior to Visit  Medication Sig Dispense Refill  . aspirin 81 MG tablet Take 81 mg by mouth daily.    . Cholecalciferol (VITAMIN D) 2000 UNITS tablet Take 2,000 Units by mouth daily.    . finasteride (PROSCAR) 5 MG tablet Take 5 mg by mouth daily.    . hydrochlorothiazide (MICROZIDE) 12.5 MG capsule Take 12.5 mg by mouth daily.    Marland Kitchen levothyroxine (SYNTHROID, LEVOTHROID) 75 MCG tablet Take 75 mcg by mouth daily before breakfast.    . pravastatin (PRAVACHOL) 20 MG tablet Take 20 mg by mouth daily.    . ramipril (ALTACE) 5 MG capsule Take 5 mg by mouth daily.    . metFORMIN (GLUCOPHAGE) 500 MG tablet Take 250 mg by mouth daily with breakfast.    . tamsulosin (FLOMAX) 0.4 MG CAPS capsule Take 0.4 mg by mouth.     No facility-administered medications prior to visit.      Allergies:   Patient has no known allergies.   Social History   Social History  . Marital status: Single      Spouse name: N/A  . Number of children: N/A  . Years of education: N/A   Social History Main Topics  . Smoking status: Never Smoker  . Smokeless tobacco: Never Used  . Alcohol use No  . Drug use: No  . Sexual activity: Not Asked   Other Topics Concern  . None   Social History Narrative  . None     Family History:  The patient's family history includes Cancer in his mother; Dementia in his sister; Diabetes in his father and sister; Heart disease in his father and sister; Obesity in his sister; Other in his father and sister; Stroke in his father.   ROS:   Please see the history of present illness.    Decreased hearing, easy bruising, otherwise no complaints.  All other systems reviewed and are negative.   PHYSICAL EXAM:   VS:  BP 118/70 (BP Location: Left Arm)   Pulse 61   Ht 5\' 9"  (1.753 m)   Wt 144 lb 6.4 oz (65.5 kg)   BMI 21.32 kg/m    GEN: Well nourished, well developed, in no acute distress  HEENT: normal  Neck: no JVD, carotid bruits, or masses Cardiac: RRR, rubs, or gallops,no edema . There is a late peaking systolic murmur heard at the apex  radiating to the left axilla. The murmur also radiates to the base and to the back out laterally. Respiratory:  clear to auscultation bilaterally, normal work of breathing GI: soft, nontender, nondistended, + BS MS: no deformity or atrophy  Skin: warm and dry, no rash Neuro:  Alert and Oriented x 3, Strength and sensation are intact Psych: euthymic mood, full affect  Wt Readings from Last 3 Encounters:  05/22/16 144 lb 6.4 oz (65.5 kg)  05/04/15 144 lb 1.9 oz (65.4 kg)  03/16/14 149 lb (67.6 kg)      Studies/Labs Reviewed:   EKG:  EKG  Normal sinus rhythm with prominent voltage and an occasional PAC. No change when compared to the prior tracing.  Recent Labs: No results found for requested labs within last 8760 hours.   Lipid Panel No results found for: CHOL, TRIG, HDL, CHOLHDL, VLDL, LDLCALC,  LDLDIRECT  Additional studies/ records that were reviewed today include:  Reviewed the bilateral carotid Doppler study performed in February 2017 which revealed bilateral less than 70% obstruction.  Last echocardiogram: December 2016: Study Conclusions  - Left ventricle: The cavity size was normal. Wall thickness was   increased in a pattern of mild LVH. Systolic function was normal.   The estimated ejection fraction was in the range of 55% to 60%.   Wall motion was normal; there were no regional wall motion   abnormalities. Features are consistent with a pseudonormal left   ventricular filling pattern, with concomitant abnormal relaxation   and increased filling pressure (grade 2 diastolic dysfunction). - Aortic valve: There was mild regurgitation. - Mitral valve: Posterior leaflet prolapse. There is very eccentric   mitral regurgitation, anteriorly directed. The MR is not fully   visualized but appears to wrap around the left atrium and may be   severe. - Left atrium: The atrium was mildly dilated. - Right ventricle: The cavity size was normal. Systolic function   was normal. - Tricuspid valve: Peak RV-RA gradient (S): 23 mm Hg. - Pulmonary arteries: PA peak pressure: 26 mm Hg (S). - Inferior vena cava: The vessel was normal in size. The   respirophasic diameter changes were in the normal range (= 50%),   consistent with normal central venous pressure.  Impressions:  - Normal LV size with mild LV hypertrophy. EF 55-60%. Moderate   diastolic dysfunction. Normal RV size and systolic function. Mild   aortic insufficiency. There was posterior leaflet mitral valve   prolapse with very eccentric anterior mitral regurgitation. The   MR wraps around the left atrium to the posterior wall and may be   severe, though it is not fully visualized on this study. Would   consider TEE.    ASSESSMENT:    1. Moderate mitral regurgitation   2. Essential hypertension   3. Bilateral  carotid artery disease ([place])   4. Other hyperlipidemia   5. Mitral prolapse      PLAN:  In order of problems listed above:  1. No clinical evidence of congestive heart failure or clinical worsening in the mitral regurgitation murmur. There is no evidence of right heart failure. Neck veins are flat. With decided to skip echocardiography this year. He will be performed approximately one month prior to our return visit in one year. 2. Her well controlled. Target 130/90 mmHg. 3. Less than 70% bilaterally. Repeat bilateral carotid Doppler in February 2018. 4. LDL target less than 70.    Medication Adjustments/Labs and Tests Ordered: Current medicines are reviewed at length with  the patient today.  Concerns regarding medicines are outlined above.  Medication changes, Labs and Tests ordered today are listed in the Patient Instructions below. There are no Patient Instructions on file for this visit.   Signed, [name], MD  05/22/2016 10:46 AM    [facility] [address], [place], [place]  [address] Phone: [phone]; Fax: [phone]

## 2016-06-03 ENCOUNTER — Encounter (HOSPITAL_COMMUNITY): Payer: Self-pay | Admitting: Emergency Medicine

## 2016-06-03 ENCOUNTER — Emergency Department (HOSPITAL_COMMUNITY)
Admission: EM | Admit: 2016-06-03 | Discharge: 2016-06-03 | Disposition: A | Discharge status: Home / Self Care | Payer: PPO | Attending: Emergency Medicine | Admitting: Emergency Medicine

## 2016-06-03 ENCOUNTER — Emergency Department (HOSPITAL_COMMUNITY): Payer: PPO

## 2016-06-03 DIAGNOSIS — W000XXA Fall on same level due to ice and snow, initial encounter: Secondary | ICD-10-CM | POA: Insufficient documentation

## 2016-06-03 DIAGNOSIS — Y929 Unspecified place or not applicable: Secondary | ICD-10-CM | POA: Diagnosis not present

## 2016-06-03 DIAGNOSIS — S42034A Nondisplaced fracture of lateral end of right clavicle, initial encounter for closed fracture: Secondary | ICD-10-CM | POA: Diagnosis not present

## 2016-06-03 DIAGNOSIS — T148XXA Other injury of unspecified body region, initial encounter: Secondary | ICD-10-CM | POA: Diagnosis not present

## 2016-06-03 DIAGNOSIS — Z7982 Long term (current) use of aspirin: Secondary | ICD-10-CM | POA: Insufficient documentation

## 2016-06-03 DIAGNOSIS — Y939 Activity, unspecified: Secondary | ICD-10-CM | POA: Insufficient documentation

## 2016-06-03 DIAGNOSIS — I10 Essential (primary) hypertension: Secondary | ICD-10-CM | POA: Insufficient documentation

## 2016-06-03 DIAGNOSIS — Y999 Unspecified external cause status: Secondary | ICD-10-CM | POA: Diagnosis not present

## 2016-06-03 DIAGNOSIS — Z7984 Long term (current) use of oral hypoglycemic drugs: Secondary | ICD-10-CM | POA: Insufficient documentation

## 2016-06-03 DIAGNOSIS — S4991XA Unspecified injury of right shoulder and upper arm, initial encounter: Secondary | ICD-10-CM | POA: Diagnosis not present

## 2016-06-03 DIAGNOSIS — S42001A Fracture of unspecified part of right clavicle, initial encounter for closed fracture: Secondary | ICD-10-CM

## 2016-06-03 DIAGNOSIS — Z79899 Other long term (current) drug therapy: Secondary | ICD-10-CM | POA: Insufficient documentation

## 2016-06-03 DIAGNOSIS — S42031A Displaced fracture of lateral end of right clavicle, initial encounter for closed fracture: Secondary | ICD-10-CM | POA: Diagnosis not present

## 2016-06-03 DIAGNOSIS — M25511 Pain in right shoulder: Secondary | ICD-10-CM | POA: Diagnosis not present

## 2016-06-03 MED ORDER — HYDROCODONE-ACETAMINOPHEN 5-325 MG PO TABS
2.0000 | ORAL_TABLET | Freq: Once | ORAL | Status: AC
  Administered 2016-06-03: 2 via ORAL
  Filled 2016-06-03: qty 2

## 2016-06-03 MED ORDER — HYDROCODONE-ACETAMINOPHEN 5-325 MG PO TABS: 2.0000 | ORAL_TABLET | ORAL | 0 refills | Status: DC | PRN

## 2016-06-03 NOTE — ED Provider Notes (Signed)
[facility] DEPT Provider Note   CSN: [id number] Arrival date & time: 06/03/16  1136     History   Chief Complaint Chief Complaint  Patient presents with  . Fall  . Shoulder Pain    HPI Adrian Olson is a 83 y.o. male.  The history is provided by the patient. No language interpreter was used.  Fall  This is a new problem. The problem occurs constantly. The problem has been gradually worsening. Nothing aggravates the symptoms. He has tried nothing for the symptoms. The treatment provided moderate relief.  Shoulder Pain     Pt reports he slipped and fell on the ice and hit his right shoulder.  Pt complains of deformity and pain.  No loss of consciousness.  Pt does not have any pain in his head.   Past Medical History:  Diagnosis Date  . Bilateral carotid artery disease (HCC)    Severe Left and moderate Right  . ED (erectile dysfunction)   . Hyperlipidemia   . Hypertension   . Lumbar disc disease   . Mitral valve prolapse    moderate to severe MR, LVEF 55% Echo 2010    Patient Active Problem List   Diagnosis Date Noted  . Mitral prolapse 03/16/2014  . Moderate mitral regurgitation 03/16/2014  . Bilateral carotid artery disease ([place]) 03/16/2014  . Essential hypertension 03/16/2014  . Hyperlipidemia 03/16/2014  . Erectile dysfunction 03/16/2014    History reviewed. No pertinent surgical history.     Home Medications    Prior to Admission medications   Medication Sig Start Date End Date Taking? Authorizing Provider  alfuzosin (UROXATRAL) 10 MG 24 hr tablet Take 10 mg by mouth daily. 03/20/16   Historical Provider, MD  aspirin 81 MG tablet Take 81 mg by mouth daily.    Historical Provider, MD  Cholecalciferol (VITAMIN D) 2000 UNITS tablet Take 2,000 Units by mouth daily.    Historical Provider, MD  finasteride (PROSCAR) 5 MG tablet Take 5 mg by mouth daily.    Historical Provider, MD  hydrochlorothiazide (MICROZIDE) 12.5 MG capsule Take 12.5 mg by mouth daily.     Historical Provider, MD  levothyroxine (SYNTHROID, LEVOTHROID) 75 MCG tablet Take 75 mcg by mouth daily before breakfast.    Historical Provider, MD  metFORMIN (GLUCOPHAGE) 500 MG tablet Take 500 mg by mouth daily. 04/23/16   Historical Provider, MD  pravastatin (PRAVACHOL) 20 MG tablet Take 20 mg by mouth daily.    Historical Provider, MD  ramipril (ALTACE) 5 MG capsule Take 5 mg by mouth daily.    Historical Provider, MD    Family History Family History  Problem Relation Age of Onset  . Cancer Mother     ovarian  . Diabetes Father   . Other Father     enlarged prostate  . Stroke Father   . Heart disease Father   . Heart disease Sister   . Obesity Sister   . Dementia Sister   . Diabetes Sister   . Other Sister     nornmal old age problems    Social History Social History  Substance Use Topics  . Smoking status: Never Smoker  . Smokeless tobacco: Never Used  . Alcohol use No     Allergies   Patient has no known allergies.   Review of Systems Review of Systems  All other systems reviewed and are negative.    Physical Exam Updated Vital Signs BP 158/61 (BP Location: Left Arm)   Pulse 62  Temp 97.6 F (36.4 C) (Oral)   Resp 16   SpO2 100%   Physical Exam  Constitutional: He appears well-developed and well-nourished.  HENT:  Head: Normocephalic.  Eyes: Pupils are equal, round, and reactive to light.  Cardiovascular: Normal rate.   Pulmonary/Chest: Effort normal.  Abdominal: Soft.  Musculoskeletal: He exhibits tenderness and deformity.  Neurological: He is alert.  Skin: Skin is warm.  Psychiatric: He has a normal mood and affect.  Nursing note and vitals reviewed.    ED Treatments / Results  Labs (all labs ordered are listed, but only abnormal results are displayed) Labs Reviewed - No data to display  EKG  EKG Interpretation None      I spoke to Dr. Erlinda Hong who will see in the office next week for further evaluation Radiology Dg Shoulder  Right  Result Date: 06/03/2016 CLINICAL DATA:  Pain after fall. EXAM: RIGHT SHOULDER - 2+ VIEW COMPARISON:  None. FINDINGS: There is a distal clavicular fracture with cranial apex angulation. The [facility] joint is not widened. Scapula and proximal humerus are normal in appearance. The transscapular Y view is limited and evaluation for dislocation is limited. However, there is no convincing evidence of dislocation. IMPRESSION: 1. Distal clavicular fracture. 2. No fracture seen in the humerus or scapula. 3. Evaluation for dislocation is limited but there is no convincing evidence of dislocation. If there is concern, an axillary view could be obtained. Electronically Signed   By: Dorise Bullion III M.D   On: 06/03/2016 12:19    Procedures Procedures (including critical care time)  Medications Ordered in ED Medications  HYDROcodone-acetaminophen (NORCO/VICODIN) 5-325 MG per tablet 2 tablet (not administered)     Initial Impression / Assessment and Plan / ED Course  I have reviewed the triage vital signs and the nursing notes.  Pertinent labs & imaging results that were available during my care of the patient were reviewed by me and considered in my medical decision making (see chart for details).      Final Clinical Impressions(s) / ED Diagnoses   Final diagnoses:  Closed nondisplaced fracture of right clavicle, unspecified part of clavicle, initial encounter    New Prescriptions New Prescriptions   No medications on file  An After Visit Summary was printed and given to the patient. Meds ordered this encounter  Medications  . HYDROcodone-acetaminophen (NORCO/VICODIN) 5-325 MG per tablet 2 tablet  . HYDROcodone-acetaminophen (NORCO/VICODIN) 5-325 MG tablet    Sig: Take 2 tablets by mouth every 4 (four) hours as needed.    Dispense:  20 tablet    Refill:  0    Order Specific Question:   Supervising Provider    Answer:   Noemi Chapel [[id number]]     Hollace Kinnier [place], PA-C 06/03/16 [place], MD 06/12/16 [phone]

## 2016-06-03 NOTE — ED Triage Notes (Signed)
Per EMS pt fall on ice resulting in right shoulder pain.

## 2016-06-05 DIAGNOSIS — S42024A Nondisplaced fracture of shaft of right clavicle, initial encounter for closed fracture: Secondary | ICD-10-CM | POA: Diagnosis not present

## 2016-07-04 ENCOUNTER — Encounter (HOSPITAL_COMMUNITY): Payer: PPO

## 2016-07-04 DIAGNOSIS — S42024D Nondisplaced fracture of shaft of right clavicle, subsequent encounter for fracture with routine healing: Secondary | ICD-10-CM | POA: Diagnosis not present

## 2016-07-05 ENCOUNTER — Encounter (HOSPITAL_COMMUNITY): Payer: PPO

## 2016-07-28 ENCOUNTER — Ambulatory Visit (HOSPITAL_COMMUNITY)
Admission: RE | Admit: 2016-07-28 | Discharge: 2016-07-28 | Disposition: A | Discharge status: Home / Self Care | Payer: PPO | Source: Ambulatory Visit | Attending: Cardiovascular Disease | Admitting: Cardiovascular Disease

## 2016-07-28 DIAGNOSIS — I739 Peripheral vascular disease, unspecified: Secondary | ICD-10-CM

## 2016-07-28 DIAGNOSIS — I6523 Occlusion and stenosis of bilateral carotid arteries: Secondary | ICD-10-CM | POA: Insufficient documentation

## 2016-07-28 DIAGNOSIS — E785 Hyperlipidemia, unspecified: Secondary | ICD-10-CM | POA: Insufficient documentation

## 2016-07-28 DIAGNOSIS — I10 Essential (primary) hypertension: Secondary | ICD-10-CM | POA: Diagnosis not present

## 2016-07-28 DIAGNOSIS — H538 Other visual disturbances: Secondary | ICD-10-CM | POA: Insufficient documentation

## 2016-07-28 DIAGNOSIS — E119 Type 2 diabetes mellitus without complications: Secondary | ICD-10-CM | POA: Diagnosis not present

## 2016-07-28 DIAGNOSIS — I779 Disorder of arteries and arterioles, unspecified: Secondary | ICD-10-CM | POA: Diagnosis not present

## 2016-07-31 ENCOUNTER — Telehealth: Payer: Self-pay | Admitting: Interventional Cardiology

## 2016-07-31 DIAGNOSIS — I779 Disorder of arteries and arterioles, unspecified: Secondary | ICD-10-CM

## 2016-07-31 DIAGNOSIS — I739 Peripheral vascular disease, unspecified: Principal | ICD-10-CM

## 2016-07-31 NOTE — Telephone Encounter (Signed)
New Message     Pt is returning you call about carotid test

## 2016-07-31 NOTE — Telephone Encounter (Signed)
Spoke with pt and informed him of carotid results. Pt verbalized understanding and was in agreement with this plan.

## 2016-08-08 DIAGNOSIS — S42024D Nondisplaced fracture of shaft of right clavicle, subsequent encounter for fracture with routine healing: Secondary | ICD-10-CM | POA: Diagnosis not present

## 2016-08-22 DIAGNOSIS — H2512 Age-related nuclear cataract, left eye: Secondary | ICD-10-CM | POA: Diagnosis not present

## 2016-08-22 DIAGNOSIS — H524 Presbyopia: Secondary | ICD-10-CM | POA: Diagnosis not present

## 2016-09-08 DIAGNOSIS — N401 Enlarged prostate with lower urinary tract symptoms: Secondary | ICD-10-CM | POA: Diagnosis not present

## 2016-09-08 DIAGNOSIS — R972 Elevated prostate specific antigen [PSA]: Secondary | ICD-10-CM | POA: Diagnosis not present

## 2016-09-08 DIAGNOSIS — R351 Nocturia: Secondary | ICD-10-CM | POA: Diagnosis not present

## 2016-09-08 DIAGNOSIS — N5201 Erectile dysfunction due to arterial insufficiency: Secondary | ICD-10-CM | POA: Diagnosis not present

## 2016-10-04 DIAGNOSIS — D649 Anemia, unspecified: Secondary | ICD-10-CM | POA: Diagnosis not present

## 2016-10-04 DIAGNOSIS — R739 Hyperglycemia, unspecified: Secondary | ICD-10-CM | POA: Diagnosis not present

## 2016-10-04 DIAGNOSIS — E7800 Pure hypercholesterolemia, unspecified: Secondary | ICD-10-CM | POA: Diagnosis not present

## 2016-10-04 DIAGNOSIS — Z125 Encounter for screening for malignant neoplasm of prostate: Secondary | ICD-10-CM | POA: Diagnosis not present

## 2016-10-04 DIAGNOSIS — E039 Hypothyroidism, unspecified: Secondary | ICD-10-CM | POA: Diagnosis not present

## 2016-10-11 DIAGNOSIS — Z0000 Encounter for general adult medical examination without abnormal findings: Secondary | ICD-10-CM | POA: Diagnosis not present

## 2016-10-11 DIAGNOSIS — R739 Hyperglycemia, unspecified: Secondary | ICD-10-CM | POA: Diagnosis not present

## 2016-10-11 DIAGNOSIS — L570 Actinic keratosis: Secondary | ICD-10-CM | POA: Diagnosis not present

## 2016-10-11 DIAGNOSIS — Z23 Encounter for immunization: Secondary | ICD-10-CM | POA: Diagnosis not present

## 2016-10-11 DIAGNOSIS — E039 Hypothyroidism, unspecified: Secondary | ICD-10-CM | POA: Diagnosis not present

## 2016-10-11 DIAGNOSIS — E7800 Pure hypercholesterolemia, unspecified: Secondary | ICD-10-CM | POA: Diagnosis not present

## 2017-02-23 DIAGNOSIS — Z23 Encounter for immunization: Secondary | ICD-10-CM | POA: Diagnosis not present

## 2017-04-04 DIAGNOSIS — R739 Hyperglycemia, unspecified: Secondary | ICD-10-CM | POA: Diagnosis not present

## 2017-04-04 DIAGNOSIS — E039 Hypothyroidism, unspecified: Secondary | ICD-10-CM | POA: Diagnosis not present

## 2017-04-04 DIAGNOSIS — E7800 Pure hypercholesterolemia, unspecified: Secondary | ICD-10-CM | POA: Diagnosis not present

## 2017-04-04 DIAGNOSIS — D649 Anemia, unspecified: Secondary | ICD-10-CM | POA: Diagnosis not present

## 2017-04-12 DIAGNOSIS — L570 Actinic keratosis: Secondary | ICD-10-CM | POA: Diagnosis not present

## 2017-04-12 DIAGNOSIS — R739 Hyperglycemia, unspecified: Secondary | ICD-10-CM | POA: Diagnosis not present

## 2017-04-12 DIAGNOSIS — D649 Anemia, unspecified: Secondary | ICD-10-CM | POA: Diagnosis not present

## 2017-04-12 DIAGNOSIS — Z125 Encounter for screening for malignant neoplasm of prostate: Secondary | ICD-10-CM | POA: Diagnosis not present

## 2017-04-12 DIAGNOSIS — E039 Hypothyroidism, unspecified: Secondary | ICD-10-CM | POA: Diagnosis not present

## 2017-04-12 DIAGNOSIS — E7800 Pure hypercholesterolemia, unspecified: Secondary | ICD-10-CM | POA: Diagnosis not present

## 2017-04-23 ENCOUNTER — Other Ambulatory Visit (HOSPITAL_COMMUNITY): Payer: PPO

## 2017-05-01 ENCOUNTER — Other Ambulatory Visit: Payer: Self-pay

## 2017-05-01 ENCOUNTER — Ambulatory Visit (HOSPITAL_COMMUNITY): Payer: PPO | Attending: Cardiology

## 2017-05-01 DIAGNOSIS — I080 Rheumatic disorders of both mitral and aortic valves: Secondary | ICD-10-CM | POA: Insufficient documentation

## 2017-05-01 DIAGNOSIS — I371 Nonrheumatic pulmonary valve insufficiency: Secondary | ICD-10-CM | POA: Diagnosis not present

## 2017-05-01 DIAGNOSIS — I340 Nonrheumatic mitral (valve) insufficiency: Secondary | ICD-10-CM | POA: Diagnosis not present

## 2017-05-01 DIAGNOSIS — I10 Essential (primary) hypertension: Secondary | ICD-10-CM | POA: Diagnosis not present

## 2017-05-01 DIAGNOSIS — E785 Hyperlipidemia, unspecified: Secondary | ICD-10-CM | POA: Insufficient documentation

## 2017-05-01 DIAGNOSIS — I341 Nonrheumatic mitral (valve) prolapse: Secondary | ICD-10-CM | POA: Insufficient documentation

## 2017-05-02 ENCOUNTER — Inpatient Hospital Stay (HOSPITAL_COMMUNITY): Admission: RE | Admit: 2017-05-02 | Payer: PPO | Source: Ambulatory Visit

## 2017-05-25 DIAGNOSIS — H2512 Age-related nuclear cataract, left eye: Secondary | ICD-10-CM | POA: Diagnosis not present

## 2017-06-13 NOTE — Progress Notes (Signed)
Cardiology Office Note    Date:  06/14/2017   ID:  Adrian Olson, DOB 1931-02-25, MRN [id number]  PCP:  Adrian Gravel, MD  Cardiologist: Adrian Grooms, MD   Chief Complaint  Patient presents with  . Cardiac Valve Problem    MVP/MR    History of Present Illness:  Adrian Olson is a 84 y.o. male  degenerative mitral valve disease, mitral regurgitation, hypertension, and bilateral carotid artery disease.  Also has hyperlipidemia, hypertension, and ED.  He feels well.  No dyspnea.  Activity levels have been stable.  He denies chest pain.  No episodes of syncope.  No peripheral edema.   Past Medical History:  Diagnosis Date  . Bilateral carotid artery disease (HCC)    Severe Left and moderate Right  . ED (erectile dysfunction)   . Hyperlipidemia   . Hypertension   . Lumbar disc disease   . Mitral valve prolapse    moderate to severe MR, LVEF 55% Echo 2010    History reviewed. No pertinent surgical history.  Current Medications: Outpatient Medications Prior to Visit  Medication Sig Dispense Refill  . alfuzosin (UROXATRAL) 10 MG 24 hr tablet Take 10 mg by mouth daily.    Marland Kitchen aspirin 81 MG tablet Take 81 mg by mouth daily.    . Cholecalciferol (VITAMIN D) 2000 UNITS tablet Take 2,000 Units by mouth daily.    . finasteride (PROSCAR) 5 MG tablet Take 5 mg by mouth daily.    . hydrochlorothiazide (MICROZIDE) 12.5 MG capsule Take 12.5 mg by mouth daily.    Marland Kitchen levothyroxine (SYNTHROID, LEVOTHROID) 75 MCG tablet Take 75 mcg by mouth daily before breakfast.    . metFORMIN (GLUCOPHAGE) 500 MG tablet Take 500 mg by mouth daily.    . pravastatin (PRAVACHOL) 20 MG tablet Take 20 mg by mouth daily.    . ramipril (ALTACE) 5 MG capsule Take 5 mg by mouth daily.    Marland Kitchen HYDROcodone-acetaminophen (NORCO/VICODIN) 5-325 MG tablet Take 2 tablets by mouth every 4 (four) hours as needed. 20 tablet 0   No facility-administered medications prior to visit.      Allergies:   Patient has no known  allergies.   Social History   Socioeconomic History  . Marital status: Single    Spouse name: None  . Number of children: None  . Years of education: None  . Highest education level: None  Social Needs  . Financial resource strain: None  . Food insecurity - worry: None  . Food insecurity - inability: None  . Transportation needs - medical: None  . Transportation needs - non-medical: None  Occupational History  . None  Tobacco Use  . Smoking status: Never Smoker  . Smokeless tobacco: Never Used  Substance and Sexual Activity  . Alcohol use: No    Alcohol/week: 0.0 oz  . Drug use: No  . Sexual activity: None  Other Topics Concern  . None  Social History Narrative  . None     Family History:  The patient's family history includes Cancer in his mother; Dementia in his sister; Diabetes in his father and sister; Heart disease in his father and sister; Obesity in his sister; Other in his father and sister; Stroke in his father.   ROS:   Please see the history of present illness.    None  All other systems reviewed and are negative.   PHYSICAL EXAM:   VS:  BP 136/64   Pulse (!) 56  Ht 5\' 9"  (1.753 m)   Wt 145 lb 3.2 oz (65.9 kg)   BMI 21.44 kg/m    GEN: Well nourished, well developed, in no acute distress  HEENT: normal  Neck: no JVD, carotid bruits, or masses Cardiac: RRR; 3/6 late peaking hello systolic murmur of mitral regurgitation.  No rubs, or gallops,no edema  Respiratory:  clear to auscultation bilaterally, normal work of breathing GI: soft, nontender, nondistended, + BS MS: no deformity or atrophy  Skin: warm and dry, no rash Neuro:  Alert and Oriented x 3, Strength and sensation are intact Psych: euthymic mood, full affect  Wt Readings from Last 3 Encounters:  06/14/17 145 lb 3.2 oz (65.9 kg)  05/22/16 144 lb 6.4 oz (65.5 kg)  05/04/15 144 lb 1.9 oz (65.4 kg)      Studies/Labs Reviewed:   EKG:  EKG sinus bradycardia, 50 bpm, left axis deviation,  QS pattern V1 and V2.  When compared to prior tracing from January 2018, PACs are no longer present.  Recent Labs: No results found for requested labs within last 8760 hours.   Lipid Panel No results found for: CHOL, TRIG, HDL, CHOLHDL, VLDL, LDLCALC, LDLDIRECT  Additional studies/ records that were reviewed today include:   2D Doppler echocardiogram 05/01/17: Study Conclusions   - Left ventricle: The cavity size was mildly dilated. Systolic   function was normal. The estimated ejection fraction was in the   range of 60% to 65%. Wall motion was normal; there were no   regional wall motion abnormalities. There was no evidence of   elevated ventricular filling pressure by Doppler parameters. - Aortic valve: Trileaflet; mildly thickened, mildly calcified   leaflets. There was mild regurgitation. - Mitral valve: Moderate prolapse, involving the middle scallop of   the posterior leaflet. There was severe regurgitation directed   anteriorly. - Left atrium: The atrium was moderately dilated. Volume/bsa, ES,   (1-plane Simpson&'s, A2C): 51.4 ml/m^2. - Pulmonary arteries: Systolic pressure was mildly increased. PA   peak pressure: 34 mm Hg (S).  Left ventricular dimensions over the past 4 years: 2014 LVIDD 4.9/ LV DES 3.2; 2015 5.2/3.3; 2016 5.2/3.5; 2018 5.6/3.5.  ASSESSMENT:    1. Mitral prolapse   2. Moderate mitral regurgitation   3. Mixed hyperlipidemia   4. Bilateral carotid artery stenosis   5. Essential hypertension      PLAN:  In order of problems listed above:  1. Significant mitral regurgitation.  Now starting to see some mild increase in LV volumes.  Totally asymptomatic.  Plan to return for follow-up in 1 year or if symptoms of exertional fatigue/dyspnea/orthopnea.  Discussed mMitra- clip.  Echocardiogram in 1 year. 2. See above 3. Not addressed but target LDL should be less than 70 given carotid disease. 4. Asymptomatic.  Carotid Doppler in April. 5. Excellent  blood pressure control.  Clinical follow-up in 1 year.    Medication Adjustments/Labs and Tests Ordered: Current medicines are reviewed at length with the patient today.  Concerns regarding medicines are outlined above.  Medication changes, Labs and Tests ordered today are listed in the Patient Instructions below. There are no Patient Instructions on file for this visit.   Signed, Adrian Grooms, MD  06/14/2017 11:26 AM    [facility] [address], [place], [place]  [address] Phone: [phone]; Fax: [phone]

## 2017-06-14 ENCOUNTER — Encounter: Payer: Self-pay | Admitting: Interventional Cardiology

## 2017-06-14 ENCOUNTER — Encounter (INDEPENDENT_AMBULATORY_CARE_PROVIDER_SITE_OTHER): Payer: Self-pay

## 2017-06-14 ENCOUNTER — Ambulatory Visit: Payer: Medicare HMO | Admitting: Interventional Cardiology

## 2017-06-14 VITALS — BP 136/64 | HR 56 | Ht 69.0 in | Wt 145.2 lb

## 2017-06-14 DIAGNOSIS — I6523 Occlusion and stenosis of bilateral carotid arteries: Secondary | ICD-10-CM | POA: Diagnosis not present

## 2017-06-14 DIAGNOSIS — I340 Nonrheumatic mitral (valve) insufficiency: Secondary | ICD-10-CM

## 2017-06-14 DIAGNOSIS — I341 Nonrheumatic mitral (valve) prolapse: Secondary | ICD-10-CM

## 2017-06-14 DIAGNOSIS — E782 Mixed hyperlipidemia: Secondary | ICD-10-CM | POA: Diagnosis not present

## 2017-06-14 DIAGNOSIS — I10 Essential (primary) hypertension: Secondary | ICD-10-CM

## 2017-06-14 NOTE — Patient Instructions (Addendum)
Medication Instructions:  Your physician recommends that you continue on your current medications as directed. Please refer to the Current Medication list given to you today.  Labwork: None  Testing/Procedures: Your physician has requested that you have an echocardiogram in 1 year. Echocardiography is a painless test that uses sound waves to create images of your heart. It provides your doctor with information about the size and shape of your heart and how well your heart's chambers and valves are working. This procedure takes approximately one hour. There are no restrictions for this procedure.   Follow-Up: Your physician wants you to follow-up in: 1 year with Dr. Tamala Julian.  You will receive a reminder letter in the mail two months in advance. If you don't receive a letter, please call our office to schedule the follow-up appointment.   Any Other Special Instructions Will Be Listed Below (If Applicable).     If you need a refill on your cardiac medications before your next appointment, please call your pharmacy.

## 2017-06-18 DIAGNOSIS — R69 Illness, unspecified: Secondary | ICD-10-CM | POA: Diagnosis not present

## 2017-06-20 DIAGNOSIS — H2181 Floppy iris syndrome: Secondary | ICD-10-CM | POA: Diagnosis not present

## 2017-06-20 DIAGNOSIS — H2512 Age-related nuclear cataract, left eye: Secondary | ICD-10-CM | POA: Diagnosis not present

## 2017-06-20 DIAGNOSIS — H25812 Combined forms of age-related cataract, left eye: Secondary | ICD-10-CM | POA: Diagnosis not present

## 2017-07-05 DIAGNOSIS — R69 Illness, unspecified: Secondary | ICD-10-CM | POA: Diagnosis not present

## 2017-07-18 DIAGNOSIS — I10 Essential (primary) hypertension: Secondary | ICD-10-CM | POA: Diagnosis not present

## 2017-07-18 DIAGNOSIS — E039 Hypothyroidism, unspecified: Secondary | ICD-10-CM | POA: Diagnosis not present

## 2017-07-18 DIAGNOSIS — Z7984 Long term (current) use of oral hypoglycemic drugs: Secondary | ICD-10-CM | POA: Diagnosis not present

## 2017-07-18 DIAGNOSIS — N529 Male erectile dysfunction, unspecified: Secondary | ICD-10-CM | POA: Diagnosis not present

## 2017-07-18 DIAGNOSIS — R32 Unspecified urinary incontinence: Secondary | ICD-10-CM | POA: Diagnosis not present

## 2017-07-18 DIAGNOSIS — E785 Hyperlipidemia, unspecified: Secondary | ICD-10-CM | POA: Diagnosis not present

## 2017-07-18 DIAGNOSIS — K08409 Partial loss of teeth, unspecified cause, unspecified class: Secondary | ICD-10-CM | POA: Diagnosis not present

## 2017-07-18 DIAGNOSIS — Z7982 Long term (current) use of aspirin: Secondary | ICD-10-CM | POA: Diagnosis not present

## 2017-07-18 DIAGNOSIS — N400 Enlarged prostate without lower urinary tract symptoms: Secondary | ICD-10-CM | POA: Diagnosis not present

## 2017-07-18 DIAGNOSIS — E119 Type 2 diabetes mellitus without complications: Secondary | ICD-10-CM | POA: Diagnosis not present

## 2017-07-27 DIAGNOSIS — Z961 Presence of intraocular lens: Secondary | ICD-10-CM | POA: Diagnosis not present

## 2017-07-31 ENCOUNTER — Ambulatory Visit (HOSPITAL_COMMUNITY)
Admission: RE | Admit: 2017-07-31 | Discharge: 2017-07-31 | Disposition: A | Discharge status: Home / Self Care | Payer: Medicare HMO | Source: Ambulatory Visit | Attending: Cardiovascular Disease | Admitting: Cardiovascular Disease

## 2017-07-31 DIAGNOSIS — I6523 Occlusion and stenosis of bilateral carotid arteries: Secondary | ICD-10-CM | POA: Insufficient documentation

## 2017-07-31 DIAGNOSIS — I739 Peripheral vascular disease, unspecified: Secondary | ICD-10-CM

## 2017-07-31 DIAGNOSIS — I779 Disorder of arteries and arterioles, unspecified: Secondary | ICD-10-CM | POA: Diagnosis not present

## 2017-08-15 DIAGNOSIS — L819 Disorder of pigmentation, unspecified: Secondary | ICD-10-CM | POA: Diagnosis not present

## 2017-08-15 DIAGNOSIS — L570 Actinic keratosis: Secondary | ICD-10-CM | POA: Diagnosis not present

## 2017-08-15 DIAGNOSIS — D0439 Carcinoma in situ of skin of other parts of face: Secondary | ICD-10-CM | POA: Diagnosis not present

## 2017-08-15 DIAGNOSIS — D485 Neoplasm of uncertain behavior of skin: Secondary | ICD-10-CM | POA: Diagnosis not present

## 2017-08-22 DIAGNOSIS — L570 Actinic keratosis: Secondary | ICD-10-CM | POA: Diagnosis not present

## 2017-09-10 DIAGNOSIS — R351 Nocturia: Secondary | ICD-10-CM | POA: Diagnosis not present

## 2017-09-10 DIAGNOSIS — R972 Elevated prostate specific antigen [PSA]: Secondary | ICD-10-CM | POA: Diagnosis not present

## 2017-09-10 DIAGNOSIS — N401 Enlarged prostate with lower urinary tract symptoms: Secondary | ICD-10-CM | POA: Diagnosis not present

## 2017-09-10 DIAGNOSIS — N5201 Erectile dysfunction due to arterial insufficiency: Secondary | ICD-10-CM | POA: Diagnosis not present

## 2017-10-10 DIAGNOSIS — Z125 Encounter for screening for malignant neoplasm of prostate: Secondary | ICD-10-CM | POA: Diagnosis not present

## 2017-10-10 DIAGNOSIS — E039 Hypothyroidism, unspecified: Secondary | ICD-10-CM | POA: Diagnosis not present

## 2017-10-10 DIAGNOSIS — R739 Hyperglycemia, unspecified: Secondary | ICD-10-CM | POA: Diagnosis not present

## 2017-10-10 DIAGNOSIS — D649 Anemia, unspecified: Secondary | ICD-10-CM | POA: Diagnosis not present

## 2017-10-10 DIAGNOSIS — E7800 Pure hypercholesterolemia, unspecified: Secondary | ICD-10-CM | POA: Diagnosis not present

## 2017-10-17 DIAGNOSIS — E119 Type 2 diabetes mellitus without complications: Secondary | ICD-10-CM | POA: Diagnosis not present

## 2017-10-17 DIAGNOSIS — I10 Essential (primary) hypertension: Secondary | ICD-10-CM | POA: Diagnosis not present

## 2017-10-17 DIAGNOSIS — N400 Enlarged prostate without lower urinary tract symptoms: Secondary | ICD-10-CM | POA: Diagnosis not present

## 2017-10-17 DIAGNOSIS — K4090 Unilateral inguinal hernia, without obstruction or gangrene, not specified as recurrent: Secondary | ICD-10-CM | POA: Diagnosis not present

## 2017-10-24 DIAGNOSIS — R69 Illness, unspecified: Secondary | ICD-10-CM | POA: Diagnosis not present

## 2018-01-25 DIAGNOSIS — Z961 Presence of intraocular lens: Secondary | ICD-10-CM | POA: Diagnosis not present

## 2018-01-30 DIAGNOSIS — R69 Illness, unspecified: Secondary | ICD-10-CM | POA: Diagnosis not present

## 2018-02-05 ENCOUNTER — Other Ambulatory Visit: Payer: Self-pay | Admitting: *Deleted

## 2018-02-05 DIAGNOSIS — I6523 Occlusion and stenosis of bilateral carotid arteries: Secondary | ICD-10-CM

## 2018-02-15 DIAGNOSIS — Z23 Encounter for immunization: Secondary | ICD-10-CM | POA: Diagnosis not present

## 2018-02-27 ENCOUNTER — Encounter: Payer: Self-pay | Admitting: Interventional Cardiology

## 2018-02-28 ENCOUNTER — Encounter: Payer: Self-pay | Admitting: Interventional Cardiology

## 2018-04-04 ENCOUNTER — Other Ambulatory Visit (HOSPITAL_COMMUNITY): Payer: Medicare HMO

## 2018-04-09 DIAGNOSIS — E119 Type 2 diabetes mellitus without complications: Secondary | ICD-10-CM | POA: Diagnosis not present

## 2018-04-09 DIAGNOSIS — I10 Essential (primary) hypertension: Secondary | ICD-10-CM | POA: Diagnosis not present

## 2018-04-18 DIAGNOSIS — D649 Anemia, unspecified: Secondary | ICD-10-CM | POA: Diagnosis not present

## 2018-04-18 DIAGNOSIS — E039 Hypothyroidism, unspecified: Secondary | ICD-10-CM | POA: Diagnosis not present

## 2018-04-18 DIAGNOSIS — E119 Type 2 diabetes mellitus without complications: Secondary | ICD-10-CM | POA: Diagnosis not present

## 2018-04-18 DIAGNOSIS — E7800 Pure hypercholesterolemia, unspecified: Secondary | ICD-10-CM | POA: Diagnosis not present

## 2018-04-18 DIAGNOSIS — I10 Essential (primary) hypertension: Secondary | ICD-10-CM | POA: Diagnosis not present

## 2018-04-26 ENCOUNTER — Other Ambulatory Visit (HOSPITAL_COMMUNITY): Payer: Medicare HMO

## 2018-05-18 DIAGNOSIS — R69 Illness, unspecified: Secondary | ICD-10-CM | POA: Diagnosis not present

## 2018-05-28 ENCOUNTER — Other Ambulatory Visit: Payer: Self-pay

## 2018-05-28 ENCOUNTER — Encounter (INDEPENDENT_AMBULATORY_CARE_PROVIDER_SITE_OTHER): Payer: Self-pay

## 2018-05-28 ENCOUNTER — Ambulatory Visit (HOSPITAL_COMMUNITY): Payer: Medicare HMO | Attending: Cardiology

## 2018-05-28 DIAGNOSIS — I340 Nonrheumatic mitral (valve) insufficiency: Secondary | ICD-10-CM

## 2018-05-28 DIAGNOSIS — I341 Nonrheumatic mitral (valve) prolapse: Secondary | ICD-10-CM | POA: Diagnosis not present

## 2018-05-30 ENCOUNTER — Encounter: Payer: Self-pay | Admitting: Interventional Cardiology

## 2018-06-16 NOTE — Progress Notes (Signed)
Cardiology Office Note:    Date:  06/17/2018   ID:  Adrian Olson, DOB 01-31-1931, MRN [id number]  PCP:  Adrian Gravel, MD  Cardiologist:  Adrian Grooms, MD   Referring MD: Adrian Gravel, MD   Chief Complaint  Patient presents with  . Coronary Artery Disease  . Cardiac Valve Problem    History of Present Illness:    Adrian Olson is a 85 y.o. male with a hx of degenerative mitral valve disease, severe mitral regurgitation, hypertension, and bilateral carotid artery disease.  Also has hyperlipidemia, hypertension, and ED.  Mr. Strojny is doing great.  He continues to exercise more than 30 minutes 5 days/week.  He denies orthopnea, PND, ankle swelling, and change in activity due to dyspnea.  He is not having angina.  He does have bilateral moderate carotid obstructive disease stable over time.  Most recent study done in September demonstrated less than 60% bilateral carotid obstruction.  No diagnosis of coronary artery disease but aggressive secondary risk prevention is being utilized.  There have been no episodes of syncope.  No transient neurological complaints.  And he denies prolonged palpitations.  No history of atrial fibrillation.    Past Medical History:  Diagnosis Date  . Bilateral carotid artery disease (HCC)    Severe Left and moderate Right  . ED (erectile dysfunction)   . Hyperlipidemia   . Hypertension   . Lumbar disc disease   . Mitral valve prolapse    moderate to severe MR, LVEF 55% Echo 2010  . Moderate mitral regurgitation 03/16/2014    History reviewed. No pertinent surgical history.  Current Medications: Current Meds  Medication Sig  . aspirin 81 MG tablet Take 81 mg by mouth daily.  . Cholecalciferol (VITAMIN D) 2000 UNITS tablet Take 2,000 Units by mouth daily.  . finasteride (PROSCAR) 5 MG tablet Take 5 mg by mouth daily.  . hydrochlorothiazide (MICROZIDE) 12.5 MG capsule Take 12.5 mg by mouth daily.  Marland Kitchen levothyroxine (SYNTHROID, LEVOTHROID) 75  MCG tablet Take 75 mcg by mouth daily before breakfast.  . metFORMIN (GLUCOPHAGE) 500 MG tablet Take 500 mg by mouth daily.  . pravastatin (PRAVACHOL) 20 MG tablet Take 20 mg by mouth daily.  . ramipril (ALTACE) 5 MG capsule Take 5 mg by mouth daily.  . tamsulosin (FLOMAX) 0.4 MG CAPS capsule Take 0.4 mg by mouth daily.     Allergies:   Patient has no known allergies.   Social History   Socioeconomic History  . Marital status: Single    Spouse name: Not on file  . Number of children: Not on file  . Years of education: Not on file  . Highest education level: Not on file  Occupational History  . Not on file  Social Needs  . Financial resource strain: Not on file  . Food insecurity:    Worry: Not on file    Inability: Not on file  . Transportation needs:    Medical: Not on file    Non-medical: Not on file  Tobacco Use  . Smoking status: Never Smoker  . Smokeless tobacco: Never Used  Substance and Sexual Activity  . Alcohol use: No    Alcohol/week: 0.0 standard drinks  . Drug use: No  . Sexual activity: Not on file  Lifestyle  . Physical activity:    Days per week: Not on file    Minutes per session: Not on file  . Stress: Not on file  Relationships  . Social  connections:    Talks on phone: Not on file    Gets together: Not on file    Attends religious service: Not on file    Active member of club or organization: Not on file    Attends meetings of clubs or organizations: Not on file    Relationship status: Not on file  Other Topics Concern  . Not on file  Social History Narrative  . Not on file     Family History: The patient's family history includes Cancer in his mother; Dementia in his sister; Diabetes in his father and sister; Heart disease in his father and sister; Obesity in his sister; Other in his father and sister; Stroke in his father.  ROS:   Please see the history of present illness.    Easy bruising all other systems reviewed and are  negative.  EKGs/Labs/Other Studies Reviewed:    The following studies were reviewed today:  2D Doppler echocardiogram performed May 28, 2018: Conclusions  - Left ventricle: The cavity size was mildly dilated. Wall   thickness was increased in a pattern of mild LVH. Systolic   function was normal. The estimated ejection fraction was in the   range of 55% to 60%. Wall motion was normal; there were no   regional wall motion abnormalities. The study is not technically   sufficient to allow evaluation of LV diastolic function. - Aortic valve: There was mild regurgitation. - Mitral valve: Severe prolapse, involving the posterior leaflet.   There was severe regurgitation directed eccentrically and   anteriorly. - Left atrium: The atrium was mildly dilated. - Right atrium: The atrium was moderately dilated. - Pulmonary arteries: Systolic pressure was moderately increased.   PA peak pressure: 53 mm Hg (S).  Impressions:  - Normal LV systolic function; mild LVE; LVESD 34 mm/LVEDD 55 mm;   sclerotic aortic valve with mild AI; severe prolpase of posterior   MV leaflet with severe MR; biatrial enlargement; mild TR with   moderate pulmonary hypertension.  Bilateral carotid Doppler September 2019: Final Interpretation: Right Carotid: Velocities in the right ICA are consistent with a 40-59%                stenosis.  Left Carotid: Velocities in the left ICA are consistent with a 40-59% stenosis.  Vertebrals:  Bilateral vertebral arteries demonstrate antegrade flow. Subclavians: Normal flow hemodynamics were seen in bilateral subclavian              arteries.  *See table(s) above for measurements and observations.   EKG:  EKG performed on 06/17/2018 demonstrates normal sinus rhythm with horizontal axis.  Mild increase in PR interval.  Recent Labs: No results found for requested labs within last 8760 hours.  Recent Lipid Panel No results found for: CHOL, TRIG, HDL, CHOLHDL,  VLDL, LDLCALC, LDLDIRECT  Physical Exam:    VS:  BP 126/62   Pulse (!) 59   Ht 5\' 9"  (1.753 m)   Wt 143 lb (64.9 kg)   SpO2 98%   BMI 21.12 kg/m     Wt Readings from Last 3 Encounters:  06/17/18 143 lb (64.9 kg)  06/14/17 145 lb 3.2 oz (65.9 kg)  05/22/16 144 lb 6.4 oz (65.5 kg)     GEN: Slender, elderly, healthy-appearing. No acute distress HEENT: Normal NECK: No JVD. LYMPHATICS: No lymphadenopathy CARDIAC: RRR.  4/6 crescendo decrescendo holosystolic murmur heard at the apex into the left axilla and into the back.  Murmur also radiates to the base  in the subclavicular region bilaterally.  No gallop, no edema VASCULAR: 2+ bilateral radial and carotid pulses, no bruits RESPIRATORY:  Clear to auscultation without rales, wheezing or rhonchi  ABDOMEN: Soft, non-tender, non-distended, No pulsatile mass, MUSCULOSKELETAL: No deformity  SKIN: Warm and dry NEUROLOGIC:  Alert and oriented x 3 PSYCHIATRIC:  Normal affect   ASSESSMENT:    1. Moderate mitral regurgitation   2. Bilateral carotid artery occlusion   3. Mixed hyperlipidemia   4. Essential hypertension    PLAN:    In order of problems listed above:  1. Mild regurgitation is severe.  No evidence of volume overload, LV structural changes, or symptoms of heart failure.  We discussed the natural history as we have in the past.  We place more emphasis on potential treatment that could include MitraClip.  He is cautioned to inform us of prolonged palpitations, orthopnea, PND, and progressive dyspnea on exertion.  He is currently walking 45 minutes every day and has not noticed any change in his exertional tolerance. 2. Aggressive secondary risk prevention including lipids/blood pressure/and surveillance. 3. LDL target less than 70. 4. Blood pressure less than 130/80 mmHg.  Overall education and awareness concerning primary/secondary risk prevention was discussed in detail: LDL less than 70, hemoglobin A1c less than 7, blood  pressure target less than 130/80 mmHg, >150 minutes of moderate aerobic activity per week, avoidance of smoking, weight control (via diet and exercise), and continued surveillance/management of/for obstructive sleep apnea.  Plan clinical follow-up in 1 year.  Will need to have a bilateral carotid Doppler and 2D Doppler echocardiogram done prior to the next visit.   Medication Adjustments/Labs and Tests Ordered: Current medicines are reviewed at length with the patient today.  Concerns regarding medicines are outlined above.  Orders Placed This Encounter  Procedures  . EKG 12-Lead  . ECHOCARDIOGRAM COMPLETE   No orders of the defined types were placed in this encounter.   Patient Instructions  Medication Instructions:  Your physician recommends that you continue on your current medications as directed. Please refer to the Current Medication list given to you today.  If you need a refill on your cardiac medications before your next appointment, please call your pharmacy.   Lab work: None Ordered  If you have labs (blood work) drawn today and your tests are completely normal, you will receive your results only by: Marland Kitchen MyChart Message (if you have MyChart) OR . A paper copy in the mail If you have any lab test that is abnormal or we need to change your treatment, we will call you to review the results.  Testing/Procedures: Your physician has requested that you have a carotid duplex in approximately 10-12 months. This test is an ultrasound of the carotid arteries in your neck. It looks at blood flow through these arteries that supply the brain with blood. Allow one hour for this exam. There are no restrictions or special instructions.  Your physician has requested that you have an echocardiogram in approximately 10-12 months. Echocardiography is a painless test that uses sound waves to create images of your heart. It provides your doctor with information about the size and shape of your  heart and how well your heart's chambers and valves are working. This procedure takes approximately one hour. There are no restrictions for this procedure.    Follow-Up: At [facility], you and your health needs are our priority.  As part of our continuing mission to provide you with exceptional heart care, we have created  designated Provider Care Teams.  These Care Teams include your primary Cardiologist (physician) and Advanced Practice Providers (APPs -  Physician Assistants and Nurse Practitioners) who all work together to provide you with the care you need, when you need it. You will need a follow up appointment in 1 years.  Please call our office 2 months in advance to schedule this appointment.  You may see Adrian Grooms, MD or one of the following Advanced Practice Providers on your designated Care Team:   Truitt Merle, NP Cecilie Kicks, NP . Kathyrn Drown, NP       Signed, Adrian Grooms, MD  06/17/2018 1:58 PM    [place]

## 2018-06-17 ENCOUNTER — Ambulatory Visit: Payer: Medicare HMO | Admitting: Interventional Cardiology

## 2018-06-17 ENCOUNTER — Encounter: Payer: Self-pay | Admitting: Interventional Cardiology

## 2018-06-17 VITALS — BP 126/62 | HR 59 | Ht 69.0 in | Wt 143.0 lb

## 2018-06-17 DIAGNOSIS — I340 Nonrheumatic mitral (valve) insufficiency: Secondary | ICD-10-CM

## 2018-06-17 DIAGNOSIS — I6523 Occlusion and stenosis of bilateral carotid arteries: Secondary | ICD-10-CM | POA: Diagnosis not present

## 2018-06-17 DIAGNOSIS — E782 Mixed hyperlipidemia: Secondary | ICD-10-CM

## 2018-06-17 DIAGNOSIS — I10 Essential (primary) hypertension: Secondary | ICD-10-CM

## 2018-06-17 NOTE — Patient Instructions (Signed)
Medication Instructions:  Your physician recommends that you continue on your current medications as directed. Please refer to the Current Medication list given to you today.  If you need a refill on your cardiac medications before your next appointment, please call your pharmacy.   Lab work: None Ordered  If you have labs (blood work) drawn today and your tests are completely normal, you will receive your results only by: Marland Kitchen MyChart Message (if you have MyChart) OR . A paper copy in the mail If you have any lab test that is abnormal or we need to change your treatment, we will call you to review the results.  Testing/Procedures: Your physician has requested that you have a carotid duplex in approximately 10-12 months. This test is an ultrasound of the carotid arteries in your neck. It looks at blood flow through these arteries that supply the brain with blood. Allow one hour for this exam. There are no restrictions or special instructions.  Your physician has requested that you have an echocardiogram in approximately 10-12 months. Echocardiography is a painless test that uses sound waves to create images of your heart. It provides your doctor with information about the size and shape of your heart and how well your heart's chambers and valves are working. This procedure takes approximately one hour. There are no restrictions for this procedure.    Follow-Up: At [facility], you and your health needs are our priority.  As part of our continuing mission to provide you with exceptional heart care, we have created designated Provider Care Teams.  These Care Teams include your primary Cardiologist (physician) and Advanced Practice Providers (APPs -  Physician Assistants and Nurse Practitioners) who all work together to provide you with the care you need, when you need it. You will need a follow up appointment in 1 years.  Please call our office 2 months in advance to schedule this appointment.   You may see [name], MD or one of the following Advanced Practice Providers on your designated Care Team:   Truitt Merle, NP Cecilie Kicks, NP . Kathyrn Drown, NP

## 2018-06-18 ENCOUNTER — Telehealth: Payer: Self-pay | Admitting: Interventional Cardiology

## 2018-06-18 NOTE — Telephone Encounter (Signed)
Spoke with pt and he states he had a carotid done 07/2017 and was told to repeat in 1 year so he called back and moved carotid appt up.  Pt would like to keep appt and get it checked sooner than planned at appt yesterday.  Advised pt this would be ok.  Pt appreciative for call.

## 2018-06-18 NOTE — Telephone Encounter (Signed)
New Message        Patient was seen on yesterday and he found out that his "Carotid was done in March of last year, (he checked his records) but he was told it was done in February so he changed his appt to reflect the correct date, Now, patient would like to make sure all of his 2019 records are up to date. Patient would like a call back when this has been checked. Thank you.

## 2018-07-22 DIAGNOSIS — R69 Illness, unspecified: Secondary | ICD-10-CM | POA: Diagnosis not present

## 2018-08-02 ENCOUNTER — Encounter (HOSPITAL_COMMUNITY): Payer: Medicare HMO

## 2018-08-30 ENCOUNTER — Encounter (HOSPITAL_COMMUNITY): Payer: Medicare HMO

## 2018-09-04 ENCOUNTER — Telehealth: Payer: Self-pay | Admitting: Interventional Cardiology

## 2018-09-04 DIAGNOSIS — R0789 Other chest pain: Secondary | ICD-10-CM | POA: Diagnosis not present

## 2018-09-04 DIAGNOSIS — R69 Illness, unspecified: Secondary | ICD-10-CM | POA: Diagnosis not present

## 2018-09-04 NOTE — Telephone Encounter (Signed)
Pt states that for the last 7-10 days he has been having intermittent chest discomfort on his right side around the breast and axillary area.  States he sits down and takes some deep breaths and they resolve.  More of a steady ache or burn, not a pain.  Denies SOB, lightheadedness, dizziness, swelling or other issues.  Denies temp or cough.  BP at PCP today was 140/62.  Pt walks 2 miles a day with no issues.  Had discomfort this morning and decided it was time to call his PCP.  He seen Vassie Moment, NP at PCP today. Advised I will send to Dr. Tamala Julian for review.

## 2018-09-04 NOTE — Telephone Encounter (Signed)
Spoke with patient by phone.  We will continue clinical observation.

## 2018-09-04 NOTE — Telephone Encounter (Signed)
Patient is having chest pain.  He PCP, [facility] called they wanted him seen in the office as soon as possible.   Will fax over office visit note.

## 2018-09-04 NOTE — Telephone Encounter (Signed)
Spoke with Maude Leriche LPN/ Dr Tamala Julian, route to her and she will call pt and schedule.

## 2018-09-04 NOTE — Telephone Encounter (Signed)
Right arm, shoulder blade, and right pectoral pain for 2 weeks. Feeling depressed during the pandemic. [place] has been continuous.  Tylenol and Naprosyn have helped to improve the discomfort.  He is not sleeping well.  He has felt depressed.  I encouraged continued clinical observation.  He was seen at Dr. Julianne Rice office at [facility] earlier today where an EKG was performed.  The EKG is normal in appearance and unchanged from prior tracings.  He has always had QS pattern V1 through V3.

## 2018-09-13 ENCOUNTER — Encounter (HOSPITAL_COMMUNITY): Payer: Medicare HMO

## 2018-09-16 DIAGNOSIS — R69 Illness, unspecified: Secondary | ICD-10-CM | POA: Diagnosis not present

## 2018-09-25 DIAGNOSIS — R69 Illness, unspecified: Secondary | ICD-10-CM | POA: Diagnosis not present

## 2018-10-03 DIAGNOSIS — R69 Illness, unspecified: Secondary | ICD-10-CM | POA: Diagnosis not present

## 2018-10-03 DIAGNOSIS — R0789 Other chest pain: Secondary | ICD-10-CM | POA: Diagnosis not present

## 2018-10-11 ENCOUNTER — Ambulatory Visit (HOSPITAL_COMMUNITY)
Admission: RE | Admit: 2018-10-11 | Discharge: 2018-10-11 | Disposition: A | Discharge status: Home / Self Care | Payer: Medicare HMO | Source: Ambulatory Visit | Attending: Internal Medicine | Admitting: Internal Medicine

## 2018-10-11 ENCOUNTER — Other Ambulatory Visit: Payer: Self-pay

## 2018-10-11 ENCOUNTER — Other Ambulatory Visit (HOSPITAL_COMMUNITY): Payer: Self-pay | Admitting: Interventional Cardiology

## 2018-10-11 DIAGNOSIS — I6523 Occlusion and stenosis of bilateral carotid arteries: Secondary | ICD-10-CM

## 2018-10-15 DIAGNOSIS — E039 Hypothyroidism, unspecified: Secondary | ICD-10-CM | POA: Diagnosis not present

## 2018-10-15 DIAGNOSIS — Z0000 Encounter for general adult medical examination without abnormal findings: Secondary | ICD-10-CM | POA: Diagnosis not present

## 2018-10-15 DIAGNOSIS — E7800 Pure hypercholesterolemia, unspecified: Secondary | ICD-10-CM | POA: Diagnosis not present

## 2018-10-15 DIAGNOSIS — R739 Hyperglycemia, unspecified: Secondary | ICD-10-CM | POA: Diagnosis not present

## 2018-10-29 DIAGNOSIS — R69 Illness, unspecified: Secondary | ICD-10-CM | POA: Diagnosis not present

## 2018-10-29 DIAGNOSIS — M4003 Postural kyphosis, cervicothoracic region: Secondary | ICD-10-CM | POA: Diagnosis not present

## 2018-10-29 DIAGNOSIS — I10 Essential (primary) hypertension: Secondary | ICD-10-CM | POA: Diagnosis not present

## 2018-10-29 DIAGNOSIS — R739 Hyperglycemia, unspecified: Secondary | ICD-10-CM | POA: Diagnosis not present

## 2018-10-29 DIAGNOSIS — E7800 Pure hypercholesterolemia, unspecified: Secondary | ICD-10-CM | POA: Diagnosis not present

## 2018-10-29 DIAGNOSIS — Z0000 Encounter for general adult medical examination without abnormal findings: Secondary | ICD-10-CM | POA: Diagnosis not present

## 2018-10-29 DIAGNOSIS — E039 Hypothyroidism, unspecified: Secondary | ICD-10-CM | POA: Diagnosis not present

## 2018-12-05 DIAGNOSIS — R351 Nocturia: Secondary | ICD-10-CM | POA: Diagnosis not present

## 2018-12-05 DIAGNOSIS — R972 Elevated prostate specific antigen [PSA]: Secondary | ICD-10-CM | POA: Diagnosis not present

## 2018-12-05 DIAGNOSIS — N5201 Erectile dysfunction due to arterial insufficiency: Secondary | ICD-10-CM | POA: Diagnosis not present

## 2018-12-05 DIAGNOSIS — N401 Enlarged prostate with lower urinary tract symptoms: Secondary | ICD-10-CM | POA: Diagnosis not present

## 2019-01-28 DIAGNOSIS — H524 Presbyopia: Secondary | ICD-10-CM | POA: Diagnosis not present

## 2019-01-28 DIAGNOSIS — E119 Type 2 diabetes mellitus without complications: Secondary | ICD-10-CM | POA: Diagnosis not present

## 2019-02-12 DIAGNOSIS — R69 Illness, unspecified: Secondary | ICD-10-CM | POA: Diagnosis not present

## 2019-02-14 DIAGNOSIS — Z23 Encounter for immunization: Secondary | ICD-10-CM | POA: Diagnosis not present

## 2019-02-18 ENCOUNTER — Encounter (HOSPITAL_COMMUNITY): Payer: Medicare HMO

## 2019-04-16 DIAGNOSIS — I10 Essential (primary) hypertension: Secondary | ICD-10-CM | POA: Diagnosis not present

## 2019-04-16 DIAGNOSIS — E7800 Pure hypercholesterolemia, unspecified: Secondary | ICD-10-CM | POA: Diagnosis not present

## 2019-04-16 DIAGNOSIS — R739 Hyperglycemia, unspecified: Secondary | ICD-10-CM | POA: Diagnosis not present

## 2019-04-28 DIAGNOSIS — N644 Mastodynia: Secondary | ICD-10-CM | POA: Diagnosis not present

## 2019-04-28 DIAGNOSIS — I10 Essential (primary) hypertension: Secondary | ICD-10-CM | POA: Diagnosis not present

## 2019-04-28 DIAGNOSIS — E7800 Pure hypercholesterolemia, unspecified: Secondary | ICD-10-CM | POA: Diagnosis not present

## 2019-04-28 DIAGNOSIS — R69 Illness, unspecified: Secondary | ICD-10-CM | POA: Diagnosis not present

## 2019-05-26 DIAGNOSIS — R69 Illness, unspecified: Secondary | ICD-10-CM | POA: Diagnosis not present

## 2019-05-26 DIAGNOSIS — N644 Mastodynia: Secondary | ICD-10-CM | POA: Diagnosis not present

## 2019-06-14 ENCOUNTER — Ambulatory Visit: Payer: Medicare HMO

## 2019-06-17 ENCOUNTER — Other Ambulatory Visit: Payer: Self-pay

## 2019-06-17 ENCOUNTER — Ambulatory Visit (HOSPITAL_COMMUNITY): Payer: Medicare HMO | Attending: Internal Medicine

## 2019-06-17 DIAGNOSIS — I340 Nonrheumatic mitral (valve) insufficiency: Secondary | ICD-10-CM | POA: Diagnosis not present

## 2019-06-19 ENCOUNTER — Ambulatory Visit: Payer: Medicare HMO | Attending: Internal Medicine

## 2019-06-19 DIAGNOSIS — Z23 Encounter for immunization: Secondary | ICD-10-CM | POA: Insufficient documentation

## 2019-06-19 NOTE — Progress Notes (Signed)
   Covid-19 Vaccination Clinic  Name:  Adrian Olson    MRN: [id number] DOB: 24-Jan-1931  06/19/2019  Adrian Olson was observed post Covid-19 immunization for 15 minutes without incidence. He was provided with Vaccine Information Sheet and instruction to access the V-Safe system.   Adrian Olson was instructed to call 911 with any severe reactions post vaccine: Marland Kitchen Difficulty breathing  . Swelling of your face and throat  . A fast heartbeat  . A bad rash all over your body  . Dizziness and weakness    Immunizations Administered    Name Date Dose VIS Date Route   Pfizer COVID-19 Vaccine 06/19/2019  3:27 PM 0.3 mL 04/25/2019 Intramuscular   Manufacturer: [place]   Lot: [id number]   Trainer: [id number]

## 2019-06-23 DIAGNOSIS — R69 Illness, unspecified: Secondary | ICD-10-CM | POA: Diagnosis not present

## 2019-06-23 DIAGNOSIS — N400 Enlarged prostate without lower urinary tract symptoms: Secondary | ICD-10-CM | POA: Diagnosis not present

## 2019-06-23 DIAGNOSIS — N644 Mastodynia: Secondary | ICD-10-CM | POA: Diagnosis not present

## 2019-06-23 DIAGNOSIS — E039 Hypothyroidism, unspecified: Secondary | ICD-10-CM | POA: Diagnosis not present

## 2019-06-23 DIAGNOSIS — R739 Hyperglycemia, unspecified: Secondary | ICD-10-CM | POA: Diagnosis not present

## 2019-06-23 DIAGNOSIS — I10 Essential (primary) hypertension: Secondary | ICD-10-CM | POA: Diagnosis not present

## 2019-06-23 DIAGNOSIS — Z0000 Encounter for general adult medical examination without abnormal findings: Secondary | ICD-10-CM | POA: Diagnosis not present

## 2019-07-15 ENCOUNTER — Ambulatory Visit: Payer: Medicare HMO | Attending: Internal Medicine

## 2019-07-15 DIAGNOSIS — Z23 Encounter for immunization: Secondary | ICD-10-CM | POA: Insufficient documentation

## 2019-07-15 NOTE — Progress Notes (Signed)
   Covid-19 Vaccination Clinic  Name:  Adrian Olson    MRN: [id number] DOB: 1930-10-17  07/15/2019  [name] was observed post Covid-19 immunization for 15 minutes without incident. He was provided with Vaccine Information Sheet and instruction to access the V-Safe system.   [name] was instructed to call 911 with any severe reactions post vaccine: Marland Kitchen Difficulty breathing  . Swelling of face and throat  . A fast heartbeat  . A bad rash all over body  . Dizziness and weakness   Immunizations Administered    Name Date Dose VIS Date Route   Pfizer COVID-19 Vaccine 07/15/2019 12:41 PM 0.3 mL 04/25/2019 Intramuscular   Manufacturer: [place]   Lot: [id number]   [place]: [id number]

## 2019-07-31 NOTE — Progress Notes (Signed)
Cardiology Office Note:    Date:  08/01/2019   ID:  Adrian Olson, DOB 1931-01-10, MRN [id number]  PCP:  [name], MD  Cardiologist:  [name], MD   Referring MD: [name], MD   Chief Complaint  Patient presents with  . Cardiac Valve Problem    History of Present Illness:    Adrian Olson is a 86 y.o. male with a hx of degenerative mitral valve disease, severe mitral regurgitation, hypertension, and bilateral carotid artery disease.Also has hyperlipidemia, hypertension, and ED.  Adrian Olson is doing well from the standpoint of cardiac symptoms.  He has significant mitral valve regurgitation with flail leaflet.  His echo earlier this month demonstrated slight enlargement in heart size but maintained systolic function.  He has not had atrial fibrillation or any arrhythmias.  He is walking greater than a mile each day of the week and has not noticed any change in exertional tolerance.  Based upon the most recently completed echocardiogram, we need to start considering mitral valve therapy which most likely would need to be a repair with a minimally invasive intercostal approach by Dr. Roxy Manns.  Since he has primary valve disease, not sure that MitraClip is really a clinical indication.  He is not in heart failure.  He is in excellent shape and otherwise has an excellent prognosis.  Past Medical History:  Diagnosis Date  . Bilateral carotid artery disease (HCC)    Severe Left and moderate Right  . ED (erectile dysfunction)   . Hyperlipidemia   . Hypertension   . Lumbar disc disease   . Mitral valve prolapse    moderate to severe MR, LVEF 55% Echo 2010  . Moderate mitral regurgitation 03/16/2014    History reviewed. No pertinent surgical history.  Current Medications: Current Meds  Medication Sig  . aspirin 81 MG tablet Take 81 mg by mouth daily.  . Cholecalciferol (VITAMIN D) 2000 UNITS tablet Take 2,000 Units by mouth daily.  . clonazePAM (KLONOPIN) 0.5 MG tablet Take 0.5  mg by mouth at bedtime.  . finasteride (PROSCAR) 5 MG tablet Take 5 mg by mouth daily.  . hydrochlorothiazide (MICROZIDE) 12.5 MG capsule Take 12.5 mg by mouth daily.  Marland Kitchen levothyroxine (SYNTHROID, LEVOTHROID) 75 MCG tablet Take 75 mcg by mouth daily before breakfast.  . metFORMIN (GLUCOPHAGE) 500 MG tablet Take 500 mg by mouth daily.  . pravastatin (PRAVACHOL) 20 MG tablet Take 20 mg by mouth daily.  . ramipril (ALTACE) 5 MG capsule Take 5 mg by mouth daily.  . tamsulosin (FLOMAX) 0.4 MG CAPS capsule Take 0.4 mg by mouth daily.     Allergies:   Patient has no known allergies.   Social History   Socioeconomic History  . Marital status: Single    Spouse name: Not on file  . Number of children: Not on file  . Years of education: Not on file  . Highest education level: Not on file  Occupational History  . Not on file  Tobacco Use  . Smoking status: Never Smoker  . Smokeless tobacco: Never Used  Substance and Sexual Activity  . Alcohol use: No    Alcohol/week: 0.0 standard drinks  . Drug use: No  . Sexual activity: Not on file  Other Topics Concern  . Not on file  Social History Narrative  . Not on file   Social Determinants of Health   Financial Resource Strain:   . Difficulty of Paying Living Expenses:   Food Insecurity:   .  Worried About Charity fundraiser in the Last Year:   . Arboriculturist in the Last Year:   Transportation Needs:   . Film/video editor (Medical):   Marland Kitchen Lack of Transportation (Non-Medical):   Physical Activity:   . Days of Exercise per Week:   . Minutes of Exercise per Session:   Stress:   . Feeling of Stress :   Social Connections:   . Frequency of Communication with Friends and Family:   . Frequency of Social Gatherings with Friends and Family:   . Attends Religious Services:   . Active Member of Clubs or Organizations:   . Attends Archivist Meetings:   Marland Kitchen Marital Status:      Family History: The patient's family history  includes Cancer in his mother; Dementia in his sister; Diabetes in his father and sister; Heart disease in his father and sister; Obesity in his sister; Other in his father and sister; Stroke in his father.  ROS:   Please see the history of present illness.    Has been very depressed during the COVID-19 pandemic.  He is exercise every day.  His wife is having increasing healthcare issues requiring his attention.  He has not been able to sleep well.  Dr. Maudie Mercury his primary physician has tried different anxiolytic and depression medications without benefit.  All other systems reviewed and are negative.  EKGs/Labs/Other Studies Reviewed:    The following studies were reviewed today:  2D Doppler echocardiogram 06/17/2019: IMPRESSIONS    1. Left ventricular ejection fraction, by visual estimation, is 60 to  65%. The left ventricle has normal function. There is mildly increased  left ventricular hypertrophy.  2. Elevated left atrial and left ventricular end-diastolic pressures.  3. Left ventricular diastolic parameters are consistent with Grade II  diastolic dysfunction (pseudonormalization).  4. The left ventricle has no regional wall motion abnormalities.  5. Global right ventricle has normal systolic function.The right  ventricular size is normal. No increase in right ventricular wall  thickness.  6. Left atrial size was severely dilated.  7. Right atrial size was normal.  8. Moderate flail of posterior leaflet.  9. Severe mitral valve prolapse.  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 Severe mitral valve regurgitation.  11. The tricuspid valve is grossly normal.  12. The tricuspid valve is grossly normal. Tricuspid valve regurgitation  is not demonstrated.  13. The aortic valve is tricuspid. Aortic valve regurgitation is mild.  Mild aortic valve sclerosis without stenosis.  14. The pulmonic valve was grossly normal. Pulmonic valve regurgitation is  not visualized.  15. Moderately  elevated pulmonary artery systolic pressure.  16. The inferior vena cava is normal in size with <50% respiratory  variability, suggesting right atrial pressure of 8 mmHg.  17. There appears to be a flail, prolapsing posterior leaflet with  possible cord rupture - there is severe, eccentric anteriorly directed  mitral regurgitation. Given the degree of left atrial enlargement, this  may be longstanding. Recommend further  evalution with TEE and multidisciplinary valve clinic evaluation.T  18. Changes from prior study are noted.  19. A prior study was performed on 05/28/2018.  20. MR was severe at that time.   EKG:  EKG normal sinus bradycardia, leftward axis, prominent voltage, first-degree AV block.  Recent Labs: No results found for requested labs within last 8760 hours.  Recent Lipid Panel No results found for: CHOL, TRIG, HDL, CHOLHDL, VLDL, LDLCALC, LDLDIRECT  Physical Exam:    VS:  BP 136/72   Pulse (!) 57   Ht 5\' 9"  (1.753 m)   Wt 146 lb 12.8 oz (66.6 kg)   SpO2 99%   BMI 21.68 kg/m     Wt Readings from Last 3 Encounters:  08/01/19 146 lb 12.8 oz (66.6 kg)  06/17/18 143 lb (64.9 kg)  06/14/17 145 lb 3.2 oz (65.9 kg)     GEN: Slender, vigorous, appearing younger than stated age.. No acute distress HEENT: Normal NECK: No JVD. LYMPHATICS: No lymphadenopathy CARDIAC: 3/6 anteriorly directed and left axillary directed holosystolic murmur.  RRR without diastolic murmur, gallop, or edema. VASCULAR:  Normal Pulses. No bruits. RESPIRATORY:  Clear to auscultation without rales, wheezing or rhonchi  ABDOMEN: Soft, non-tender, non-distended, No pulsatile mass, MUSCULOSKELETAL: No deformity  SKIN: Warm and dry NEUROLOGIC:  Alert and oriented x 3 PSYCHIATRIC:  Normal affect   ASSESSMENT:    1. Moderate mitral regurgitation   2. Mixed hyperlipidemia   3. Essential hypertension   4. Bilateral carotid artery stenosis   5. Educated about COVID-19 virus infection   6. Other  depression    PLAN:    In order of problems listed above:  1. Severe mitral regurgitation with some increase in LV size.  Although the patient according to his barometer of physical condition is doing well, his heart is starting to show evidence of volume overload and it is now time to consider mitral valve repair.  He would likely need surgical repair rather than MitraClip.  We discussed having left and right heart cath and then referring to Dr. Roxy Manns.  The patient is unsure that at his age he will want to have anything done.  I think his overall prognosis is quite good and surgery is not out of the question.  He may allow referral to the surgeon to give further information.  He will need to have a TEE prior to that.  He will eventually need left and right heart cath.  He will call back in the next 2 to 3 months to help [place] decide upon a firm course of action. 2. Continue Pravachol 20 mg/day. 3. Excellent blood pressure control, continue Altace 5 mg/day and Microzide. 4. Continue primary prevention with lipid and blood pressure control 5. Has received the vaccine. 6. Depression is currently a major issue and is likely impacting his decision concerning heart care.  He admits this.  Right now he is anxious and depressed and does not want to have anything else done.  Follow-up in 6 months.  Hopefully he will call sooner to schedule evaluation by surgeon for additional information concerning mitral valve repair.   Medication Adjustments/Labs and Tests Ordered: Current medicines are reviewed at length with the patient today.  Concerns regarding medicines are outlined above.  Orders Placed This Encounter  Procedures  . EKG 12-Lead   No orders of the defined types were placed in this encounter.   Patient Instructions  Medication Instructions:  Your physician recommends that you continue on your current medications as directed. Please refer to the Current Medication list given to you today.  *If  you need a refill on your cardiac medications before your next appointment, please call your pharmacy*   Lab Work: None If you have labs (blood work) drawn today and your tests are completely normal, you will receive your results only by: Marland Kitchen MyChart Message (if you have MyChart) OR . A paper copy in the mail If you have any lab test that is abnormal or  we need to change your treatment, we will call you to review the results.   Testing/Procedures: None   Follow-Up: At [facility], you and your health needs are our priority.  As part of our continuing mission to provide you with exceptional heart care, we have created designated Provider Care Teams.  These Care Teams include your primary Cardiologist (physician) and Advanced Practice Providers (APPs -  Physician Assistants and Nurse Practitioners) who all work together to provide you with the care you need, when you need it.  We recommend signing up for the patient portal called "MyChart".  Sign up information is provided on this After Visit Summary.  MyChart is used to connect with patients for Virtual Visits (Telemedicine).  Patients are able to view lab/test results, encounter notes, upcoming appointments, etc.  Non-urgent messages can be sent to your provider as well.   To learn more about what you can do with MyChart, go to [email or web address].    Your next appointment:   6 month(s)  The format for your next appointment:   In Person  Provider:   You may see [name], MD or one of the following Advanced Practice Providers on your designated Care Team:    Truitt Merle, NP  Cecilie Kicks, NP  Kathyrn Drown, NP    Other Instructions      Signed, [name], MD  08/01/2019 11:21 AM    Adrian Olson

## 2019-08-01 ENCOUNTER — Ambulatory Visit: Payer: Medicare HMO | Admitting: Interventional Cardiology

## 2019-08-01 ENCOUNTER — Other Ambulatory Visit: Payer: Self-pay

## 2019-08-01 ENCOUNTER — Encounter: Payer: Self-pay | Admitting: Interventional Cardiology

## 2019-08-01 VITALS — BP 136/72 | HR 57 | Ht 69.0 in | Wt 146.8 lb

## 2019-08-01 DIAGNOSIS — E782 Mixed hyperlipidemia: Secondary | ICD-10-CM

## 2019-08-01 DIAGNOSIS — Z7189 Other specified counseling: Secondary | ICD-10-CM | POA: Diagnosis not present

## 2019-08-01 DIAGNOSIS — I10 Essential (primary) hypertension: Secondary | ICD-10-CM | POA: Diagnosis not present

## 2019-08-01 DIAGNOSIS — I6523 Occlusion and stenosis of bilateral carotid arteries: Secondary | ICD-10-CM

## 2019-08-01 DIAGNOSIS — I340 Nonrheumatic mitral (valve) insufficiency: Secondary | ICD-10-CM | POA: Diagnosis not present

## 2019-08-01 DIAGNOSIS — R69 Illness, unspecified: Secondary | ICD-10-CM | POA: Diagnosis not present

## 2019-08-01 DIAGNOSIS — F3289 Other specified depressive episodes: Secondary | ICD-10-CM

## 2019-08-01 NOTE — Patient Instructions (Signed)

## 2019-08-04 DIAGNOSIS — R69 Illness, unspecified: Secondary | ICD-10-CM | POA: Diagnosis not present

## 2019-08-26 ENCOUNTER — Telehealth: Payer: Self-pay | Admitting: Interventional Cardiology

## 2019-08-26 NOTE — Telephone Encounter (Signed)
Patient would like to speak with Anderson Malta, Dr. Thompson Caul nurse, in regards to his last OV with Dr. Tamala Julian.

## 2019-08-26 NOTE — Telephone Encounter (Signed)
Spoke with pt and he was calling about testing for Mitral Valve procedure.  Advised pt I would have to get him back into the office because H&P has to be within 30 days.  Scheduled pt for 4/26 with Dr. Tamala Julian to discuss TEE and cath.  Pt verbalized understanding and was appreciative for call.

## 2019-09-02 DIAGNOSIS — R69 Illness, unspecified: Secondary | ICD-10-CM | POA: Diagnosis not present

## 2019-09-07 NOTE — Progress Notes (Signed)
Cardiology Office Note:    Date:  09/08/2019   ID:  NYEEM CORNETT, DOB 09-20-1930, MRN [id number]  PCP:  [name], MD  Cardiologist:  [name], MD   Referring MD: [name], MD   Chief Complaint  Patient presents with  . 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 male with a hx of with a hx of degenerative mitral valve disease,severemitral regurgitation, hypertension, and bilateral carotid artery disease.Also has hyperlipidemia, hypertension, and ED.  Mr. Cacchione is back in today to have further conversation concerning mitral valve disease and management.  He does now admit that he notices decreased exertional capacity and slightly more shortness of breath with physical activity.  He denies prolonged palpitations, orthopnea, lower extremity swelling, and PND.  He has not had anginal quality chest pain.  He expresses a strong preference to MitraClip if at all possible.  He would prefer to avoid invasive surgery but does understand that no decision can be made about the best approach in his case without further data.  He understands that a transesophageal echo and left and right heart catheterization with coronary angiography will be required before being referred to the mitral valve clinic.  He is physically quite active.  He walks every day.  Over the past 6-12 months, he recognizes there is more of a struggle to get in his typical walking duration and pace..  However, he notices no change in his overall ability to perform typical activities of daily living.  Past Medical History:  Diagnosis Date  . Bilateral carotid artery disease (HCC)    Severe Left and moderate Right  . ED (erectile dysfunction)   . Hyperlipidemia   . Hypertension   . Lumbar disc disease   . Mitral valve prolapse    moderate to severe MR, LVEF 55% Echo 2010  . Moderate mitral regurgitation 03/16/2014     History reviewed. No pertinent surgical history.  Current Medications: Current Meds  Medication Sig  . aspirin 81 MG tablet Take 81 mg by mouth daily.  . Cholecalciferol (VITAMIN D) 2000 UNITS tablet Take 2,000 Units by mouth daily.  . clonazePAM (KLONOPIN) 0.5 MG tablet Take 0.5 mg by mouth at bedtime.  . finasteride (PROSCAR) 5 MG tablet Take 5 mg by mouth daily.  . hydrochlorothiazide (MICROZIDE) 12.5 MG capsule Take 12.5 mg by mouth daily.  Marland Kitchen levothyroxine (SYNTHROID, LEVOTHROID) 75 MCG tablet Take 75 mcg by mouth daily before breakfast.  . metFORMIN (GLUCOPHAGE) 500 MG tablet Take 500 mg by mouth daily.  . pravastatin (PRAVACHOL) 20 MG tablet Take 20 mg by mouth daily.  . ramipril (ALTACE) 5 MG capsule Take 5 mg by mouth daily.  . tamsulosin (FLOMAX) 0.4 MG CAPS capsule Take 0.4 mg by mouth daily.     Allergies:   Patient has no known allergies.   Social History   Socioeconomic History  . Marital status: Single    Spouse name: Not on file  . Number of children: Not on file  . Years of education: Not on file  . Highest education level: Not on file  Occupational History  . Not on file  Tobacco Use  . Smoking status: Never Smoker  . Smokeless tobacco: Never Used  Substance and Sexual Activity  . Alcohol use: No    Alcohol/week: 0.0 standard drinks  . Drug use: No  .  Sexual activity: Not on file  Other Topics Concern  . Not on file  Social History Narrative  . Not on file   Social Determinants of Health   Financial Resource Strain:   . Difficulty of Paying Living Expenses:   Food Insecurity:   . Worried About Charity fundraiser in the Last Year:   . Arboriculturist in the Last Year:   Transportation Needs:   . Film/video editor (Medical):   Marland Kitchen Lack of Transportation (Non-Medical):   Physical Activity:   . Days of Exercise per Week:   . Minutes of Exercise per Session:   Stress:   . Feeling of Stress :   Social Connections:   . Frequency of  Communication with Friends and Family:   . Frequency of Social Gatherings with Friends and Family:   . Attends Religious Services:   . Active Member of Clubs or Organizations:   . Attends Archivist Meetings:   Marland Kitchen Marital Status:      Family History: The patient's family history includes Cancer in his mother; Dementia in his sister; Diabetes in his father and sister; Heart disease in his father and sister; Obesity in his sister; Other in his father and sister; Stroke in his father.  ROS:   Please see the history of present illness.    He is obviously researched the possibility of MitraClip.  All other systems reviewed and are negative.  EKGs/Labs/Other Studies Reviewed:    The following studies were reviewed today:  2D Doppler echocardiogram 06/17/2019: IMPRESSIONS    1. Left ventricular ejection fraction, by visual estimation, is 60 to  65%. The left ventricle has normal function. There is mildly increased  left ventricular hypertrophy.  2. Elevated left atrial and left ventricular end-diastolic pressures.  3. Left ventricular diastolic parameters are consistent with Grade II  diastolic dysfunction (pseudonormalization).  4. The left ventricle has no regional wall motion abnormalities.  5. Global right ventricle has normal systolic function.The right  ventricular size is normal. No increase in right ventricular wall  thickness.  6. Left atrial size was severely dilated.  7. Right atrial size was normal.  8. Moderate flail of posterior leaflet.  9. Severe mitral valve prolapse.  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 Severe mitral valve regurgitation.  11. The tricuspid valve is grossly normal.  12. The tricuspid valve is grossly normal. Tricuspid valve regurgitation  is not demonstrated.  13. The aortic valve is tricuspid. Aortic valve regurgitation is mild.  Mild aortic valve sclerosis without stenosis.  14. The pulmonic valve was grossly normal. Pulmonic  valve regurgitation is  not visualized.  15. Moderately elevated pulmonary artery systolic pressure.  16. The inferior vena cava is normal in size with <50% respiratory  variability, suggesting right atrial pressure of 8 mmHg.  17. There appears to be a flail, prolapsing posterior leaflet with  possible cord rupture - there is severe, eccentric anteriorly directed  mitral regurgitation. Given the degree of left atrial enlargement, this  may be longstanding. Recommend further  evalution with TEE and multidisciplinary valve clinic evaluation.T  18. Changes from prior study are noted.  19. A prior study was performed on 05/28/2018.  20. MR was severe at that time.   EKG:  EKG sinus rhythm, left axis deviation/left anterior hemiblock, and otherwise unremarkable.  Recent Labs: No results found for requested labs within last 8760 hours.  Recent Lipid Panel No results found for: CHOL, TRIG, HDL, CHOLHDL, VLDL, LDLCALC,  LDLDIRECT  Physical Exam:    VS:  BP (!) 146/82   Pulse 70   Ht 5\' 9"  (1.753 m)   Wt 145 lb 1.9 oz (65.8 kg)   SpO2 99%   BMI 21.43 kg/m     Wt Readings from Last 3 Encounters:  09/08/19 145 lb 1.9 oz (65.8 kg)  08/01/19 146 lb 12.8 oz (66.6 kg)  06/17/18 143 lb (64.9 kg)     GEN: Slender, and appears younger than stated age.. No acute distress HEENT: Normal NECK: No JVD. LYMPHATICS: No lymphadenopathy CARDIAC:  RRR with holosystolic/late peaking 4/6 systolic mitral regurgitation murmur.  The murmur is heard best on the left lower sternal border, apex, and also radiates into the axilla.  No, gallop, or edema. VASCULAR:  Normal Pulses. No bruits. RESPIRATORY:  Clear to auscultation without rales, wheezing or rhonchi  ABDOMEN: Soft, non-tender, non-distended, No pulsatile mass, MUSCULOSKELETAL: No deformity  SKIN: Warm and dry NEUROLOGIC:  Alert and oriented x 3 PSYCHIATRIC:  Normal affect   ASSESSMENT:    1. Severe mitral regurgitation   2. Mixed  hyperlipidemia   3. Essential hypertension   4. Bilateral carotid artery stenosis   5. Educated about COVID-19 virus infection    PLAN:    In order of problems listed above:  1. The patient has symptoms with moderate to heavy physical activity noted by exertional fatigue and dyspnea.  He is functional class II, since he has been consistently more active than his typical age group.  We again discussed the treatment options for severe mitral regurgitation to include: MitraClip versus surgical mitral valve repair.  He has a clear preference for MitraClip.  He is not excluded the possibility of open surgical repair with mini incision.  Quoted overall outcome without significant complication to be in the 90 to 93% range.  Urged him however to overweight the more experienced and precise information that will be received in the valve clinic concerning outcome.  After this conversation the patient has decided to move forward with transesophageal echocardiography and left and right heart cath with left ventriculography, preferably all on the same day as an outpatient at [facility]. 2. Not discussed 3. Not discussed 4. Not discussed 5. COVID-19 vaccination has been received.  He is practicing social distancing.  The patient was counseled to undergo left heart catheterization, coronary angiography, and possible percutaneous coronary intervention with stent implantation. The procedural risks and benefits were discussed in detail. The risks discussed included death, stroke, myocardial infarction, life-threatening bleeding, limb ischemia, kidney injury, allergy, and possible emergency cardiac surgery. The risk of these significant complications were estimated to occur less than 1% of the time. After discussion, the patient has agreed to proceed.    Medication Adjustments/Labs and Tests Ordered: Current medicines are reviewed at length with the patient today.  Concerns regarding medicines are outlined  above.  No orders of the defined types were placed in this encounter.  No orders of the defined types were placed in this encounter.   There are no Patient Instructions on file for this visit.   Signed, [name], MD  09/08/2019 10:46 AM    [place]

## 2019-09-08 ENCOUNTER — Encounter (INDEPENDENT_AMBULATORY_CARE_PROVIDER_SITE_OTHER): Payer: Self-pay

## 2019-09-08 ENCOUNTER — Ambulatory Visit: Payer: Medicare HMO | Admitting: Interventional Cardiology

## 2019-09-08 ENCOUNTER — Encounter: Payer: Self-pay | Admitting: *Deleted

## 2019-09-08 ENCOUNTER — Encounter: Payer: Self-pay | Admitting: Interventional Cardiology

## 2019-09-08 ENCOUNTER — Other Ambulatory Visit: Payer: Self-pay

## 2019-09-08 VITALS — BP 146/82 | HR 70 | Ht 69.0 in | Wt 145.1 lb

## 2019-09-08 DIAGNOSIS — I10 Essential (primary) hypertension: Secondary | ICD-10-CM | POA: Diagnosis not present

## 2019-09-08 DIAGNOSIS — I6523 Occlusion and stenosis of bilateral carotid arteries: Secondary | ICD-10-CM | POA: Diagnosis not present

## 2019-09-08 DIAGNOSIS — Z7189 Other specified counseling: Secondary | ICD-10-CM

## 2019-09-08 DIAGNOSIS — I340 Nonrheumatic mitral (valve) insufficiency: Secondary | ICD-10-CM | POA: Diagnosis not present

## 2019-09-08 DIAGNOSIS — E782 Mixed hyperlipidemia: Secondary | ICD-10-CM | POA: Diagnosis not present

## 2019-09-08 LAB — CBC
Hematocrit: 36.8 % — ABNORMAL LOW (ref 37.5–51.0)
Hemoglobin: 12.7 g/dL — ABNORMAL LOW (ref 13.0–17.7)
MCH: 31.8 pg (ref 26.6–33.0)
MCHC: 34.5 g/dL (ref 31.5–35.7)
MCV: 92 fL (ref 79–97)
Platelets: 191 10*3/uL (ref 150–450)
RBC: 4 x10E6/uL — ABNORMAL LOW (ref 4.14–5.80)
RDW: 11.8 % (ref 11.6–15.4)
WBC: 6.8 10*3/uL (ref 3.4–10.8)

## 2019-09-08 LAB — BASIC METABOLIC PANEL
BUN/Creatinine Ratio: 13 (ref 10–24)
BUN: 15 mg/dL (ref 8–27)
CO2: 26 mmol/L (ref 20–29)
Calcium: 9.9 mg/dL (ref 8.6–10.2)
Chloride: 94 mmol/L — ABNORMAL LOW (ref 96–106)
Creatinine, Ser: 1.12 mg/dL (ref 0.76–1.27)
GFR calc Af Amer: 68 mL/min/{1.73_m2} (ref >59)
GFR calc non Af Amer: 59 mL/min/{1.73_m2} — ABNORMAL LOW (ref >59)
Glucose: 141 mg/dL — ABNORMAL HIGH (ref 65–99)
Potassium: 4.7 mmol/L (ref 3.5–5.2)
Sodium: 133 mmol/L — ABNORMAL LOW (ref 134–144)

## 2019-09-08 NOTE — Patient Instructions (Addendum)
Medication Instructions:  Your physician recommends that you continue on your current medications as directed. Please refer to the Current Medication list given to you today.  *If you need a refill on your cardiac medications before your next appointment, please call your pharmacy*   Lab Work: BMET and CBC today  If you have labs (blood work) drawn today and your tests are completely normal, you will receive your results only by: Marland Kitchen MyChart Message (if you have MyChart) OR . A paper copy in the mail If you have any lab test that is abnormal or we need to change your treatment, we will call you to review the results.   Testing/Procedures: Your physician has requested that you have a cardiac catheterization. Cardiac catheterization is used to diagnose and/or treat various heart conditions. Doctors may recommend this procedure for a number of different reasons. The most common reason is to evaluate chest pain. Chest pain can be a symptom of coronary artery disease (CAD), and cardiac catheterization can show whether plaque is narrowing or blocking your heart's arteries. This procedure is also used to evaluate the valves, as well as measure the blood flow and oxygen levels in different parts of your heart. For further information please visit [email or web address][employer]tn. Please follow instruction sheet, as given.  Your physician has requested that you have a TEE. During a TEE, sound waves are used to create images of your heart. It provides your doctor with information about the size and shape of your heart and how well your heart's chambers and valves are working. In this test, a transducer is attached to the end of a flexible tube that's guided down your throat and into your esophagus (the tube leading from you mouth to your stomach) to get a more detailed image of your heart. You are not awake for the procedure. Please see the instruction sheet given to you today. For further information please visit  [email or web address].    Follow-Up: You will follow up with the Valve team after your procedures.    Other Instructions      [place] OFFICE [place], [place] [address] Dept: [phone] Loc: [place]  09/08/2019  You are scheduled for a Cardiac Catheterization on Tuesday, May 4 with Dr. Daneen Schick.  1. Please arrive at the [facility] (Main Entrance A) at [facility]: [address] [place], [place] [address] at 8:00 AM (This time is two hours before your procedure to ensure your preparation). Free valet parking service is available.   Special note: Every effort is made to have your procedure done on time. Please understand that emergencies sometimes delay scheduled procedures.  2. Diet: Do not eat solid foods after midnight.  The patient may have clear liquids until 5am upon the day of the procedure.  3. Labs: You will have labs drawn today.  4. Medication instructions in preparation for your procedure:   Contrast Allergy: No   You will need to hold your Hydrochlorothiazide the morning of your procedure.  You will need to hold your Ramipril the day before and the morning of your procedure.   Do not take Diabetes Med Glucophage (Metformin) on the day of the procedure and HOLD 48 HOURS AFTER THE PROCEDURE.  On the morning of your procedure, take your Aspirin and any morning medicines NOT listed above.  You may use sips of water.  5. Plan for one night stay--bring  personal belongings. 6. Bring a current list of your medications and current insurance cards. 7. You MUST have a responsible person to drive you home. 8. Someone MUST be with you the first 24 hours after you arrive home or your discharge will be delayed. 9. Please wear clothes that are easy to get on and off and wear slip-on shoes.  Thank you for allowing [place] to care for you!   --  [facility]

## 2019-09-08 NOTE — Addendum Note (Signed)
Addended by: Loren Racer on: 09/08/2019 11:25 AM   Modules accepted: Orders

## 2019-09-13 ENCOUNTER — Other Ambulatory Visit (HOSPITAL_COMMUNITY)
Admission: RE | Admit: 2019-09-13 | Discharge: 2019-09-13 | Disposition: A | Discharge status: Home / Self Care | Payer: Medicare HMO | Source: Ambulatory Visit | Attending: Interventional Cardiology | Admitting: Interventional Cardiology

## 2019-09-13 DIAGNOSIS — Z01812 Encounter for preprocedural laboratory examination: Secondary | ICD-10-CM | POA: Insufficient documentation

## 2019-09-13 DIAGNOSIS — Z20822 Contact with and (suspected) exposure to covid-19: Secondary | ICD-10-CM | POA: Diagnosis not present

## 2019-09-13 LAB — SARS CORONAVIRUS 2 (TAT 6-24 HRS): SARS Coronavirus 2: NEGATIVE

## 2019-09-15 ENCOUNTER — Telehealth: Payer: Self-pay | Admitting: *Deleted

## 2019-09-15 NOTE — Telephone Encounter (Addendum)
Pt contacted pre-catheterization scheduled at [facility] for: Tuesday May 4,2021 10:30 AM Verified arrival time and place: [place] [facility]) at: 8 AM/TEE 9 AM   Nothing to eat or drink after midnight prior to procedures, may have sips of water to take medications Contrast allergy: no  Hold: Rampril-day before and day of procedure -GFR 59 Metformin-day of procedure and 48 hours post procedure. HCTZ-AM of procedure.  Except hold medications AM meds can be  taken pre-cath with sip of water including: ASA 81 mg   Confirmed patient has responsible adult to drive home post procedure and observe 24 hours after arriving home: yes  You are allowed ONE visitor in the waiting room during your procedures. Both you and your visitor must wear masks.      COVID-19 Pre-Screening Questions:  . In the past 7 to 10 days have you had a cough,  shortness of breath, headache, congestion, fever (100 or greater) body aches, chills, sore throat, or sudden loss of taste or sense of smell? no . Have you been around anyone with known Covid 19 in the past 7 to 10 days? no . Have you been around anyone who is awaiting Covid 19 test results in the past 7 to 10 days? no . Have you been around anyone that  has mentioned symptoms of Covid 19 within the past 7 to 10 days? no   Reviewed procedure/mask/visitor instructions, COVID-19 screening questions with patient.

## 2019-09-16 ENCOUNTER — Encounter (HOSPITAL_COMMUNITY)
Admission: RE | Disposition: A | Discharge status: Home / Self Care | Payer: Self-pay | Source: Home / Self Care | Attending: Cardiology

## 2019-09-16 ENCOUNTER — Ambulatory Visit (HOSPITAL_BASED_OUTPATIENT_CLINIC_OR_DEPARTMENT_OTHER)
Admission: RE | Admit: 2019-09-16 | Discharge: 2019-09-16 | Disposition: A | Discharge status: Home / Self Care | Payer: Medicare HMO | Source: Ambulatory Visit | Attending: Interventional Cardiology | Admitting: Interventional Cardiology

## 2019-09-16 ENCOUNTER — Other Ambulatory Visit: Payer: Self-pay

## 2019-09-16 ENCOUNTER — Ambulatory Visit (HOSPITAL_COMMUNITY)
Admission: RE | Admit: 2019-09-16 | Discharge: 2019-09-16 | Disposition: A | Discharge status: Home / Self Care | Payer: Medicare HMO | Attending: Cardiology | Admitting: Cardiology

## 2019-09-16 ENCOUNTER — Ambulatory Visit (HOSPITAL_COMMUNITY): Payer: Medicare HMO | Admitting: Certified Registered"

## 2019-09-16 ENCOUNTER — Encounter (HOSPITAL_COMMUNITY): Payer: Self-pay | Admitting: Cardiology

## 2019-09-16 DIAGNOSIS — Z8249 Family history of ischemic heart disease and other diseases of the circulatory system: Secondary | ICD-10-CM | POA: Insufficient documentation

## 2019-09-16 DIAGNOSIS — E782 Mixed hyperlipidemia: Secondary | ICD-10-CM | POA: Insufficient documentation

## 2019-09-16 DIAGNOSIS — I2510 Atherosclerotic heart disease of native coronary artery without angina pectoris: Secondary | ICD-10-CM

## 2019-09-16 DIAGNOSIS — Z79890 Hormone replacement therapy (postmenopausal): Secondary | ICD-10-CM | POA: Insufficient documentation

## 2019-09-16 DIAGNOSIS — I10 Essential (primary) hypertension: Secondary | ICD-10-CM | POA: Diagnosis present

## 2019-09-16 DIAGNOSIS — Z79899 Other long term (current) drug therapy: Secondary | ICD-10-CM | POA: Insufficient documentation

## 2019-09-16 DIAGNOSIS — I6523 Occlusion and stenosis of bilateral carotid arteries: Secondary | ICD-10-CM | POA: Diagnosis not present

## 2019-09-16 DIAGNOSIS — E039 Hypothyroidism, unspecified: Secondary | ICD-10-CM | POA: Diagnosis not present

## 2019-09-16 DIAGNOSIS — E785 Hyperlipidemia, unspecified: Secondary | ICD-10-CM | POA: Diagnosis not present

## 2019-09-16 DIAGNOSIS — I340 Nonrheumatic mitral (valve) insufficiency: Secondary | ICD-10-CM

## 2019-09-16 DIAGNOSIS — Z7982 Long term (current) use of aspirin: Secondary | ICD-10-CM | POA: Diagnosis not present

## 2019-09-16 DIAGNOSIS — Z7984 Long term (current) use of oral hypoglycemic drugs: Secondary | ICD-10-CM | POA: Insufficient documentation

## 2019-09-16 DIAGNOSIS — I700 Atherosclerosis of aorta: Secondary | ICD-10-CM | POA: Insufficient documentation

## 2019-09-16 DIAGNOSIS — I083 Combined rheumatic disorders of mitral, aortic and tricuspid valves: Secondary | ICD-10-CM | POA: Diagnosis not present

## 2019-09-16 HISTORY — DX: Type 2 diabetes mellitus without complications: E11.9

## 2019-09-16 HISTORY — PX: TEE WITHOUT CARDIOVERSION: SHX5443

## 2019-09-16 HISTORY — PX: RIGHT/LEFT HEART CATH AND CORONARY ANGIOGRAPHY: CATH118266

## 2019-09-16 LAB — POCT I-STAT 7, (LYTES, BLD GAS, ICA,H+H)
Acid-Base Excess: 1 mmol/L (ref 0.0–2.0)
Bicarbonate: 26.4 mmol/L (ref 20.0–28.0)
Calcium, Ion: 1.19 mmol/L (ref 1.15–1.40)
HCT: 35 % — ABNORMAL LOW (ref 39.0–52.0)
Hemoglobin: 11.9 g/dL — ABNORMAL LOW (ref 13.0–17.0)
O2 Saturation: 99 %
Potassium: 3.9 mmol/L (ref 3.5–5.1)
Sodium: 137 mmol/L (ref 135–145)
TCO2: 28 mmol/L (ref 22–32)
pCO2 arterial: 43.8 mmHg (ref 32.0–48.0)
pH, Arterial: 7.388 (ref 7.350–7.450)
pO2, Arterial: 128 mmHg — ABNORMAL HIGH (ref 83.0–108.0)

## 2019-09-16 LAB — POCT I-STAT EG7
Acid-Base Excess: 1 mmol/L (ref 0.0–2.0)
Bicarbonate: 26.9 mmol/L (ref 20.0–28.0)
Calcium, Ion: 1.22 mmol/L (ref 1.15–1.40)
HCT: 36 % — ABNORMAL LOW (ref 39.0–52.0)
Hemoglobin: 12.2 g/dL — ABNORMAL LOW (ref 13.0–17.0)
O2 Saturation: 78 %
Potassium: 3.9 mmol/L (ref 3.5–5.1)
Sodium: 137 mmol/L (ref 135–145)
TCO2: 28 mmol/L (ref 22–32)
pCO2, Ven: 47.6 mmHg (ref 44.0–60.0)
pH, Ven: 7.361 (ref 7.250–7.430)
pO2, Ven: 45 mmHg (ref 32.0–45.0)

## 2019-09-16 LAB — GLUCOSE, CAPILLARY: Glucose-Capillary: 130 mg/dL — ABNORMAL HIGH (ref 70–99)

## 2019-09-16 SURGERY — RIGHT/LEFT HEART CATH AND CORONARY ANGIOGRAPHY
Anesthesia: LOCAL

## 2019-09-16 SURGERY — ECHOCARDIOGRAM, TRANSESOPHAGEAL
Anesthesia: Monitor Anesthesia Care

## 2019-09-16 MED ORDER — HEPARIN (PORCINE) IN NACL 1000-0.9 UT/500ML-% IV SOLN
INTRAVENOUS | Status: DC | PRN
  Administered 2019-09-16 (×2): 500 mL

## 2019-09-16 MED ORDER — HEPARIN SODIUM (PORCINE) 1000 UNIT/ML IJ SOLN
INTRAMUSCULAR | Status: DC | PRN
  Administered 2019-09-16: 3500 [IU] via INTRAVENOUS

## 2019-09-16 MED ORDER — ASPIRIN 81 MG PO CHEW: 81.0000 mg | CHEWABLE_TABLET | ORAL | Status: DC

## 2019-09-16 MED ORDER — SODIUM CHLORIDE 0.9 % WEIGHT BASED INFUSION: 1.0000 mL/kg/h | INTRAVENOUS | Status: DC

## 2019-09-16 MED ORDER — SODIUM CHLORIDE 0.9 % WEIGHT BASED INFUSION
3.0000 mL/kg/h | INTRAVENOUS | Status: AC
  Administered 2019-09-16: 3 mL/kg/h via INTRAVENOUS

## 2019-09-16 MED ORDER — PROPOFOL 500 MG/50ML IV EMUL
INTRAVENOUS | Status: DC | PRN
  Administered 2019-09-16: 100 ug/kg/min via INTRAVENOUS

## 2019-09-16 MED ORDER — IOHEXOL 350 MG/ML SOLN
INTRAVENOUS | Status: DC | PRN
  Administered 2019-09-16: 60 mL via INTRA_ARTERIAL

## 2019-09-16 MED ORDER — PROPOFOL 10 MG/ML IV BOLUS
INTRAVENOUS | Status: DC | PRN
  Administered 2019-09-16: 20 mg via INTRAVENOUS
  Administered 2019-09-16: 10 mg via INTRAVENOUS
  Administered 2019-09-16 (×2): 20 mg via INTRAVENOUS

## 2019-09-16 MED ORDER — HEPARIN (PORCINE) IN NACL 1000-0.9 UT/500ML-% IV SOLN
INTRAVENOUS | Status: AC
  Filled 2019-09-16: qty 1000

## 2019-09-16 MED ORDER — MIDAZOLAM HCL 2 MG/2ML IJ SOLN
INTRAMUSCULAR | Status: AC
  Filled 2019-09-16: qty 2

## 2019-09-16 MED ORDER — SODIUM CHLORIDE 0.9% FLUSH: 3.0000 mL | Freq: Two times a day (BID) | INTRAVENOUS | Status: DC

## 2019-09-16 MED ORDER — SODIUM CHLORIDE 0.9% FLUSH: 3.0000 mL | INTRAVENOUS | Status: DC | PRN

## 2019-09-16 MED ORDER — MIDAZOLAM HCL 2 MG/2ML IJ SOLN
INTRAMUSCULAR | Status: DC | PRN
  Administered 2019-09-16 (×3): 0.5 mg via INTRAVENOUS

## 2019-09-16 MED ORDER — LIDOCAINE HCL (PF) 1 % IJ SOLN
INTRAMUSCULAR | Status: AC
  Filled 2019-09-16: qty 30

## 2019-09-16 MED ORDER — FENTANYL CITRATE (PF) 100 MCG/2ML IJ SOLN
INTRAMUSCULAR | Status: DC | PRN
  Administered 2019-09-16 (×2): 25 ug via INTRAVENOUS

## 2019-09-16 MED ORDER — FENTANYL CITRATE (PF) 100 MCG/2ML IJ SOLN
INTRAMUSCULAR | Status: AC
  Filled 2019-09-16: qty 2

## 2019-09-16 MED ORDER — LIDOCAINE HCL (PF) 1 % IJ SOLN
INTRAMUSCULAR | Status: DC | PRN
  Administered 2019-09-16: 8 mL

## 2019-09-16 MED ORDER — SODIUM CHLORIDE 0.9 % IV SOLN: 250.0000 mL | INTRAVENOUS | Status: DC | PRN

## 2019-09-16 MED ORDER — VERAPAMIL HCL 2.5 MG/ML IV SOLN
INTRAVENOUS | Status: AC
  Filled 2019-09-16: qty 2

## 2019-09-16 MED ORDER — VERAPAMIL HCL 2.5 MG/ML IV SOLN
INTRAVENOUS | Status: DC | PRN
  Administered 2019-09-16: 10 mL via INTRA_ARTERIAL

## 2019-09-16 SURGICAL SUPPLY — 12 items
CATH 5FR JL3.5 JR4 ANG PIG MP (CATHETERS) ×1 IMPLANT
CATH BALLN WEDGE 5F 110CM (CATHETERS) ×1 IMPLANT
DEVICE RAD COMP TR BAND LRG (VASCULAR PRODUCTS) ×2 IMPLANT
GLIDESHEATH SLEND A-KIT 6F 22G (SHEATH) ×1 IMPLANT
GUIDEWIRE INQWIRE 1.5J.035X260 (WIRE) IMPLANT
INQWIRE 1.5J .035X260CM (WIRE) ×2
KIT HEART LEFT (KITS) ×2 IMPLANT
PACK CARDIAC CATHETERIZATION (CUSTOM PROCEDURE TRAY) ×2 IMPLANT
SHEATH GLIDE SLENDER 4/5FR (SHEATH) ×1 IMPLANT
SHEATH PROBE COVER 6X72 (BAG) ×1 IMPLANT
TRANSDUCER W/STOPCOCK (MISCELLANEOUS) ×2 IMPLANT
TUBING CIL FLEX 10 FLL-RA (TUBING) ×2 IMPLANT

## 2019-09-16 NOTE — Progress Notes (Addendum)
    Transesophageal Echocardiogram Note  Adrian Olson [id number] Jan 24, 1931  Procedure: Transesophageal Echocardiogram Indications: Mitral regurgitation  Procedure Details Consent: Obtained Time Out: Verified patient identification, verified procedure, site/side was marked, verified correct patient position, special equipment/implants available, Radiology Safety Procedures followed,  medications/allergies/relevent history reviewed, required imaging and test results available.  Performed  Medications:   Pt sedated by anesthesia with diprovan 238 mg IV total.  Normal LV function; mild AI; flail P2 scallop of posterior MV leaflet with severe eccentric MR; severe LAE; no LAA thrombus; mild TR.   Complications: No apparent complications Patient did tolerate procedure well.  Kirk Ruths, MD

## 2019-09-16 NOTE — H&P (Signed)
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; Referred by [name], MD   Reason for Visit        Additional Documentation   Vitals:       BP 146/82        Pulse 70       Ht 5\' 9"  (1.753 m)       Wt 65.8 kg       SpO2 99%       BMI 21.43 kg/m       BSA 1.79 m             More Vitals    Flowsheets:        NEWS,       MEWS Score,       Anthropometrics     Encounter Info:        Billing Info,       History,       Allergies,       Detailed Report            All Notes      Addendum Note by Loren Racer, RN at 09/08/2019 10:00 AM   Author: Loren Racer, RN Author Type: Registered Nurse Filed: 09/08/2019 11:25 AM  Note Status: Signed Cosign: Cosign Not Required Encounter Date: 09/08/2019  Editor: Loren Racer, RN (Registered Nurse)           Addended by: Loren Racer on: 09/08/2019 11:25 AM      Modules accepted: Orders          Progress Notes by Belva Crome, MD at 09/08/2019 10:00 AM   Author: Belva Crome, MD Author Type: Physician Filed: 09/08/2019 11:22 AM  Note Status: Signed Cosign: Cosign Not Required Encounter Date: 09/08/2019  Editor: Belva Crome, MD (Physician)              untitled image   Cardiology Office Note:        Date:  09/08/2019      ID:  Adrian Olson, DOB Dec 29, 1930, MRN [id number]     PCP:  [name], MD             Cardiologist:  [name], MD      Referring MD: [name], MD           Chief Complaint    Patient presents with    .   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 male with a hx of with a hx of degenerative mitral valve disease, severe mitral regurgitation, hypertension, and bilateral carotid artery disease.  Also has hyperlipidemia, hypertension, and ED.     Mr. Olson is back in today to have further conversation concerning mitral valve disease and management.  He does now admit that he notices decreased exertional capacity and slightly more shortness of breath with physical activity.  He denies prolonged palpitations, orthopnea, lower extremity swelling, and PND.  He has not had anginal quality chest pain.     He expresses a strong preference to MitraClip if at all possible.  He would prefer to avoid invasive surgery but does understand that no decision can be made about the best approach in his case without further data.  He understands that a transesophageal echo and left and right heart catheterization with coronary angiography will be required before being referred to the mitral valve clinic.     He is physically quite active.  He walks every day.  Over the past 6-12 months, he recognizes there is more of a struggle to get in his typical walking duration and pace..  However, he notices no change in his overall ability to perform typical activities of daily living.          Past Medical History:    Diagnosis   Date    .   Bilateral carotid artery disease (HCC)            Severe Left and moderate Right    .   ED (erectile dysfunction)        .   Hyperlipidemia        .   Hypertension        .   Lumbar disc disease        .   Mitral valve prolapse            moderate to severe MR, LVEF 55% Echo 2010    .   Moderate mitral regurgitation   03/16/2014          History reviewed. No pertinent surgical history.     Current Medications:       Current Meds    Medication   Sig    .   aspirin  81 MG tablet   Take 81 mg by mouth daily.    .   Cholecalciferol (VITAMIN D) 2000 UNITS tablet   Take 2,000 Units by mouth daily.    .   clonazePAM (KLONOPIN) 0.5 MG tablet   Take 0.5 mg by mouth at bedtime.    .   finasteride (PROSCAR) 5 MG tablet   Take 5 mg by mouth daily.    .   hydrochlorothiazide (MICROZIDE) 12.5 MG capsule   Take 12.5 mg by mouth daily.    Marland Kitchen   levothyroxine (SYNTHROID, LEVOTHROID) 75 MCG tablet   Take 75 mcg by mouth daily before breakfast.    .   metFORMIN (GLUCOPHAGE) 500 MG tablet   Take 500 mg by mouth daily.    .   pravastatin (PRAVACHOL) 20 MG tablet   Take 20 mg by mouth daily.    .   ramipril (ALTACE) 5 MG capsule   Take 5 mg by mouth daily.    .   tamsulosin (FLOMAX) 0.4 MG CAPS capsule   Take 0.4 mg by mouth daily.          Allergies:   Patient has no known allergies.       Social History             Socioeconomic History    .   Marital status:   Single            Spouse name:   Not on file    .   Number of children:   Not on file    .   Years of education:  Not on file    .   Highest education level:   Not on file    Occupational History    .   Not on file    Tobacco Use    .   Smoking status:   Never Smoker    .   Smokeless tobacco:   Never Used    Substance and Sexual Activity    .   Alcohol use:   No            Alcohol/week:   0.0 standard drinks    .   Drug use:   No    .   Sexual activity:   Not on file    Other Topics   Concern    .   Not on file    Social History Narrative    .   Not on file        Social Determinants of Health           Financial Resource Strain:     .   Difficulty of Paying Living Expenses:     Food Insecurity:     .   Worried About Charity fundraiser in the Last Year:     .   Arboriculturist in the Last Year:      Transportation Needs:     .   Film/video editor (Medical):     Marland Kitchen   Lack of Transportation (Non-Medical):     Physical Activity:     .   Days of Exercise per Week:     .   Minutes of Exercise per Session:     Stress:     .   Feeling of Stress :     Social Connections:     .   Frequency of Communication with Friends and Family:     .   Frequency of Social Gatherings with Friends and Family:     .   Attends Religious Services:     .   Active Member of Clubs or Organizations:     .   Attends Archivist Meetings:     Marland Kitchen   Marital Status:           Family History:  The patient's family history includes Cancer in his mother; Dementia in his sister; Diabetes in his father and sister; Heart disease in his father and sister; Obesity in his sister; Other in his father and sister; Stroke in his father.     ROS:    Please see the history of present illness.     He is obviously researched the possibility of MitraClip.  All other systems reviewed and are negative.      EKGs/Labs/Other Studies Reviewed:        The following studies were reviewed today:     2D Doppler echocardiogram 06/17/2019:  IMPRESSIONS     1. Left ventricular ejection fraction, by visual estimation, is 60 to  65%. The left ventricle has normal function. There is mildly increased  left ventricular hypertrophy.   2. Elevated left atrial and left ventricular end-diastolic pressures.   3. Left ventricular diastolic parameters are consistent with Grade II  diastolic dysfunction (pseudonormalization).   4. The left ventricle has no regional wall motion abnormalities.   5. Global right ventricle has normal systolic function.The right  ventricular size is normal. No increase in right ventricular wall  thickness.   6. Left atrial size was severely  dilated.   7. Right atrial size was normal.   8. Moderate flail of posterior leaflet.   9.  Severe mitral valve prolapse.  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 Severe mitral valve regurgitation.  11. The tricuspid valve is grossly normal.  12. The tricuspid valve is grossly normal. Tricuspid valve regurgitation  is not demonstrated.  13. The aortic valve is tricuspid. Aortic valve regurgitation is mild.  Mild aortic valve sclerosis without stenosis.  14. The pulmonic valve was grossly normal. Pulmonic valve regurgitation is  not visualized.  15. Moderately elevated pulmonary artery systolic pressure.  16. The inferior vena cava is normal in size with <50% respiratory  variability, suggesting right atrial pressure of 8 mmHg.  17. There appears to be a flail, prolapsing posterior leaflet with  possible cord rupture - there is severe, eccentric anteriorly directed  mitral regurgitation. Given the degree of left atrial enlargement, this  may be longstanding. Recommend further  evalution with TEE and multidisciplinary valve clinic evaluation.T  18. Changes from prior study are noted.  19. A prior study was performed on 05/28/2018.  20. MR was severe at that time.      EKG:  EKG sinus rhythm, left axis deviation/left anterior hemiblock, and otherwise unremarkable.     Recent Labs:  No results found for requested labs within last 8760 hours.   Recent Lipid Panel  No results found for: CHOL, TRIG, HDL, CHOLHDL, VLDL, LDLCALC, LDLDIRECT      Physical Exam:        VS:  BP (!) 146/82   Pulse 70   Ht 5\' 9"  (1.753 m)   Wt 145 lb 1.9 oz (65.8 kg)   SpO2 99%   BMI 21.43 kg/m            Wt Readings from Last 3 Encounters:    09/08/19   145 lb 1.9 oz (65.8 kg)    08/01/19   146 lb 12.8 oz (66.6 kg)    06/17/18   143 lb (64.9 kg)          GEN: Slender, and appears younger than stated age.. No acute distress  HEENT: Normal  NECK: No JVD.  LYMPHATICS: No lymphadenopathy  CARDIAC:  RRR with holosystolic/late peaking 4/6 systolic mitral  regurgitation murmur.  The murmur is heard best on the left lower sternal border, apex, and also radiates into the axilla.  No, gallop, or edema.  VASCULAR:  Normal Pulses. No bruits.  RESPIRATORY:  Clear to auscultation without rales, wheezing or rhonchi   ABDOMEN: Soft, non-tender, non-distended, No pulsatile mass,  MUSCULOSKELETAL: No deformity   SKIN: Warm and dry  NEUROLOGIC:  Alert and oriented x 3  PSYCHIATRIC:  Normal affect       ASSESSMENT:         1.   Severe mitral regurgitation     2.   Mixed hyperlipidemia     3.   Essential hypertension     4.   Bilateral carotid artery stenosis     5.   Educated about COVID-19 virus infection         PLAN:        In order of problems listed above:     1.The patient has symptoms with moderate to heavy physical activity noted by exertional fatigue and dyspnea.  He is functional class II, since he has been consistently more active than his typical age group.  We again discussed the treatment options for severe mitral regurgitation to  include: MitraClip versus surgical mitral valve repair.  He has a clear preference for MitraClip.  He is not excluded the possibility of open surgical repair with mini incision.  Quoted overall outcome without significant complication to be in the 90 to 93% range.  Urged him however to overweight the more experienced and precise information that will be received in the valve clinic concerning outcome.  After this conversation the patient has decided to move forward with transesophageal echocardiography and left and right heart cath with left ventriculography, preferably all on the same day as an outpatient at [facility].   2.Not discussed   3.Not discussed   4.Not discussed   5.COVID-19 vaccination has been received.  He is practicing social distancing.      The patient was counseled to undergo left heart catheterization, coronary angiography, and  possible percutaneous coronary intervention with stent implantation. The procedural risks and benefits were discussed in detail. The risks discussed included death, stroke, myocardial infarction, life-threatening bleeding, limb ischemia, kidney injury, allergy, and possible emergency cardiac surgery. The risk of these significant complications were estimated to occur less than 1% of the time. After discussion, the patient has agreed to proceed.           Medication Adjustments/Labs and Tests Ordered:  Current medicines are reviewed at length with the patient today.  Concerns regarding medicines are outlined above.   No orders of the defined types were placed in this encounter.     No orders of the defined types were placed in this encounter.        There are no Patient Instructions on file for this visit.      Signed,  [name], MD   09/08/2019 10:46 AM     [facility]    For TEE; no changes. Kirk Ruths

## 2019-09-16 NOTE — CV Procedure (Signed)
   Left and right heart catheterization via right radial and right antecubital vein.  Both access sites achieved using real-time vascular ultrasound.  Prominent V wave to 36 mmHg mean wedge 14 mmHg  Normal pulmonary artery pressures with mean PA 23 mmHg.  Widely patent coronary arteries with segmental 30 to 40% bulky ostial to proximal LAD.  Cardiac output 6.6 L/min based upon Fick outputs.  Plan is to refer to Dr. Kathryne Sharper valve sideration of minimally invasive valve repair and or MitraClip.

## 2019-09-16 NOTE — Anesthesia Postprocedure Evaluation (Addendum)
Anesthesia Post Note  Patient: Adrian Olson  Procedure(s) Performed: TRANSESOPHAGEAL ECHOCARDIOGRAM (TEE) (N/A )     Patient location during evaluation: Endoscopy Anesthesia Type: MAC Level of consciousness: awake and alert Pain management: pain level controlled Vital Signs Assessment: post-procedure vital signs reviewed and stable Respiratory status: spontaneous breathing and respiratory function stable Cardiovascular status: stable Postop Assessment: no apparent nausea or vomiting Anesthetic complications: no    Last Vitals:  Vitals:   09/16/19 0931 09/16/19 0952  BP: (!) 99/46   Pulse: 76   Resp: 18   Temp: 36.9 C   SpO2: 99% 100%    Last Pain:  Vitals:   09/16/19 1011  TempSrc:   PainSc: 0-No pain                 Anaijah Augsburger DANIEL

## 2019-09-16 NOTE — Discharge Instructions (Signed)
Drink plenty of fluid for 48 hours and keep wrist elevated at heart level for 24 hours  Radial Site Care   This sheet gives you information about how to care for yourself after your procedure. Your health care provider may also give you more specific instructions. If you have problems or questions, contact your health care provider. What can I expect after the procedure? After the procedure, it is common to have:  Bruising and tenderness at the catheter insertion area. Follow these instructions at home: Medicines  Take over-the-counter and prescription medicines only as told by your health care provider. Insertion site care 1. Follow instructions from your health care provider about how to take care of your insertion site. Make sure you: ? Wash your hands with soap and water before you change your bandage (dressing). If soap and water are not available, use hand sanitizer. ? remove your dressing as told by your health care provider. In 24 hours 2. Check your insertion site every day for signs of infection. Check for: ? Redness, swelling, or pain. ? Fluid or blood. ? Pus or a bad smell. ? Warmth. 3. Do not take baths, swim, or use a hot tub until your health care provider approves. 4. You may shower 24-48 hours after the procedure, or as directed by your health care provider. ? Remove the dressing and gently wash the site with plain soap and water. ? Pat the area dry with a clean towel. ? Do not rub the site. That could cause bleeding. 5. Do not apply powder or lotion to the site. Activity   1. For 24 hours after the procedure, or as directed by your health care provider: ? Do not flex or bend the affected arm. ? Do not push or pull heavy objects with the affected arm. ? Do not drive yourself home from the hospital or clinic. You may drive 24 hours after the procedure unless your health care provider tells you not to. ? Do not operate machinery or power tools. 2. Do not lift  anything that is heavier than 10 lb (4.5 kg), or the limit that you are told, until your health care provider says that it is safe. For 4 days 3. Ask your health care provider when it is okay to: ? Return to work or school. ? Resume usual physical activities or sports. ? Resume sexual activity. General instructions  If the catheter site starts to bleed, raise your arm and put firm pressure on the site. If the bleeding does not stop, get help right away. This is a medical emergency.  If you went home on the same day as your procedure, a responsible adult should be with you for the first 24 hours after you arrive home.  Keep all follow-up visits as told by your health care provider. This is important. Contact a health care provider if:  You have a fever.  You have redness, swelling, or yellow drainage around your insertion site. Get help right away if:  You have unusual pain at the radial site.  The catheter insertion area swells very fast.  The insertion area is bleeding, and the bleeding does not stop when you hold steady pressure on the area.  Your arm or hand becomes pale, cool, tingly, or numb. These symptoms may represent a serious problem that is an emergency. Do not wait to see if the symptoms will go away. Get medical help right away. Call your local emergency services (911 in the [place] Do not   drive yourself to the hospital. Summary  After the procedure, it is common to have bruising and tenderness at the site.  Follow instructions from your health care provider about how to take care of your radial site wound. Check the wound every day for signs of infection.  Do not lift anything that is heavier than 10 lb (4.5 kg), or the limit that you are told, until your health care provider says that it is safe. This information is not intended to replace advice given to you by your health care provider. Make sure you discuss any questions you have with your health care  provider. Document Revised: 06/06/2017 Document Reviewed: 06/06/2017 Elsevier Patient Education  2020 [employer]  

## 2019-09-16 NOTE — Progress Notes (Signed)
  Echocardiogram Echocardiogram Transesophageal has been performed.  Adrian Olson 09/16/2019, 9:46 AM

## 2019-09-16 NOTE — Interval H&P Note (Signed)
History and Physical Interval Note:  09/16/2019 8:05 AM  Adrian Olson  has presented today for surgery, with the diagnosis of MITROVALVE REGURG.  The various methods of treatment have been discussed with the patient and family. After consideration of risks, benefits and other options for treatment, the patient has consented to  Procedure(s): TRANSESOPHAGEAL ECHOCARDIOGRAM (TEE) (N/A) as a surgical intervention.  The patient's history has been reviewed, patient examined, no change in status, stable for surgery.  I have reviewed the patient's chart and labs.  Questions were answered to the patient's satisfaction.     Kirk Ruths

## 2019-09-16 NOTE — Progress Notes (Signed)
Patient and daughter was given discharge instructions. Both verbalized understanding. 

## 2019-09-16 NOTE — Transfer of Care (Signed)
Immediate Anesthesia Transfer of Care Note  Patient: Adrian Olson  Procedure(s) Performed: TRANSESOPHAGEAL ECHOCARDIOGRAM (TEE) (N/A )  Patient Location: Endoscopy Unit  Anesthesia Type:MAC  Level of Consciousness: drowsy and patient cooperative  Airway & Oxygen Therapy: Patient Spontanous Breathing and Patient connected to nasal cannula oxygen  Post-op Assessment: Report given to RN, Post -op Vital signs reviewed and stable and Patient moving all extremities  Post vital signs: Reviewed and stable  Last Vitals:  Vitals Value Taken Time  BP    Temp    Pulse 75 09/16/19 0928  Resp 17 09/16/19 0928  SpO2 100 % 09/16/19 0928  Vitals shown include unvalidated device data.  Last Pain:  Vitals:   09/16/19 0823  TempSrc: Oral  PainSc: 0-No pain         Complications: No apparent anesthesia complications

## 2019-09-16 NOTE — Anesthesia Preprocedure Evaluation (Addendum)
Anesthesia Evaluation  Patient identified by MRN, date of birth, ID band Patient awake    Reviewed: Allergy & Precautions, NPO status , Patient's Chart, lab work & pertinent test results  History of Anesthesia Complications Negative for: history of anesthetic complications  Airway Mallampati: II  TM Distance: >3 FB     Dental no notable dental hx. (+) Dental Advisory Given   Pulmonary neg pulmonary ROS,    Pulmonary exam normal        Cardiovascular hypertension, Pt. on medications negative cardio ROS   Rhythm:Irregular Rate:Normal     Neuro/Psych negative neurological ROS  negative psych ROS   GI/Hepatic negative GI ROS, Neg liver ROS,   Endo/Other  diabetesHypothyroidism   Renal/GU negative Renal ROS  negative genitourinary   Musculoskeletal negative musculoskeletal ROS (+)   Abdominal   Peds negative pediatric ROS (+)  Hematology negative hematology ROS (+)   Anesthesia Other Findings   Reproductive/Obstetrics negative OB ROS                            Anesthesia Physical Anesthesia Plan  ASA: III  Anesthesia Plan: MAC   Post-op Pain Management:    Induction:   PONV Risk Score and Plan: 1 and Propofol infusion and Treatment may vary due to age or medical condition  Airway Management Planned: Natural Airway  Additional Equipment:   Intra-op Plan:   Post-operative Plan:   Informed Consent: I have reviewed the patients History and Physical, chart, labs and discussed the procedure including the risks, benefits and alternatives for the proposed anesthesia with the patient or authorized representative who has indicated his/her understanding and acceptance.     Dental advisory given  Plan Discussed with: Anesthesiologist  Anesthesia Plan Comments:         Anesthesia Quick Evaluation

## 2019-09-16 NOTE — Addendum Note (Signed)
Addendum  created 09/16/19 1020 by Duane Boston, MD   Clinical Note Signed

## 2019-09-22 ENCOUNTER — Encounter: Payer: Self-pay | Admitting: Thoracic Surgery (Cardiothoracic Vascular Surgery)

## 2019-09-22 ENCOUNTER — Encounter: Payer: Medicare HMO | Admitting: Thoracic Surgery (Cardiothoracic Vascular Surgery)

## 2019-09-22 ENCOUNTER — Institutional Professional Consult (permissible substitution): Payer: Medicare HMO | Admitting: Thoracic Surgery (Cardiothoracic Vascular Surgery)

## 2019-09-22 ENCOUNTER — Other Ambulatory Visit: Payer: Self-pay

## 2019-09-22 VITALS — BP 160/70 | HR 64 | Temp 98.4°F | Resp 20 | Ht 69.0 in | Wt 143.0 lb

## 2019-09-22 DIAGNOSIS — I340 Nonrheumatic mitral (valve) insufficiency: Secondary | ICD-10-CM | POA: Diagnosis not present

## 2019-09-22 DIAGNOSIS — I341 Nonrheumatic mitral (valve) prolapse: Secondary | ICD-10-CM

## 2019-09-22 NOTE — Patient Instructions (Signed)
Continue all previous medications without any changes at this time  

## 2019-09-22 NOTE — Progress Notes (Signed)
HE[facility] AND [place] [facility]GERY CONSULTATION REPORT  Primary Cardiologist is [name], MD PCP is [name], MD  Chief Complaint  Patient presents with  . Mitral Regurgitation    Surgical eval, Cardiac Cath and TEE 09/16/19, ECHO 06/17/19    HPI:  Patient is an 86 year old male with history of mitral valve prolapse and mitral regurgitation, hypertension, bilateral carotid artery disease, hyperlipidemia, diabetes, anxiety and depression who has been referred for surgical consultation to discuss treatment options for management of mitral valve prolapse with severe symptomatic primary mitral regurgitation.  Patient has a longstanding history of systolic murmur and mitral valve prolapse for which she has been followed by Dr. Tamala Julian for many years.  He has remained physically active and functionally independent.  He has recently admitted decreased exercise capacity with increased exertional shortness of breath and generalized fatigue.  He has also been suffering from increasing amounts of anxiety and depression over the past year.  He was seen in follow-up by Dr. Tamala Julian and transthoracic echocardiogram performed mitral valve prolapse with severe mitral regurgitation and normal left ventricular systolic function but slightly enlarged left ventricular chamber size.  He subsequently underwent TEE and diagnostic cardiac catheterization on Sep 16, 2019.  TEE confirmed the presence of mitral valve prolapse with a large flail segment involving a portion of the posterior leaflet causing severe mitral regurgitation.  There was severe left atrial enlargement and no left atrial thrombus.  There is moderate tricuspid regurgitation.  Left ventricular systolic function was felt to be normal with ejection fraction reported 55 to 60%.  Diagnostic cardiac catheterization revealed mild nonobstructive coronary artery disease with exception of an eccentric 60%  narrowing of the mid left anterior descending coronary artery.  Right heart pressures were normal although there were large V waves on wedge tracing consistent with severe mitral regurgitation.  Cardiothoracic surgical consultation was requested.  Patient is married and lives locally in [place] with his wife.  He has been retired for many years but he has remained physically active and functionally independent throughout retirement.  He still walks on a daily basis as much is 1 to 2 miles every day.  However, he complains that he gets tired more easily than he used to and his exercise tolerance has dropped off.  He denies resting shortness of breath, PND, orthopnea, or lower extremity edema.  He has never had any chest pain or chest tightness either with activity or at rest.  He also complains that he has been having trouble with anxiety and depression over the past year.  He blames some of this on the COVID-19 pandemic, but he claims that he is really struggling and worried about his ability to recover from any kind of surgical intervention.  He also complains of the onset of mild short-term memory loss although this has not become problematic from a practical standpoint.   Past Medical History:  Diagnosis Date  . Bilateral carotid artery disease (HCC)    Severe Left and moderate Right  . Diabetes mellitus without complication ([place])   . ED (erectile dysfunction)   . Hyperlipidemia   . Hypertension   . Lumbar disc disease   . Mitral valve prolapse    moderate to severe MR, LVEF 55% Echo 2010  . Moderate mitral regurgitation 03/16/2014    Past Surgical History:  Procedure Laterality Date  . RIGHT/LEFT HEART CATH AND CORONARY ANGIOGRAPHY N/A 09/16/2019   Procedure: RIGHT/LEFT HEART CATH AND CORONARY  ANGIOGRAPHY;  Surgeon: Belva Crome, MD;  Location: [facility];  Service: Cardiovascular;  Laterality: N/A;  . TEE WITHOUT CARDIOVERSION N/A 09/16/2019   Procedure: TRANSESOPHAGEAL  ECHOCARDIOGRAM (TEE);  Surgeon: Lelon Perla, MD;  Location: [facility] ENDOSCOPY;  Service: Cardiovascular;  Laterality: N/A;    Family History  Problem Relation Age of Onset  . Cancer Mother        ovarian  . Diabetes Father   . Other Father        enlarged prostate  . Stroke Father   . Heart disease Father   . Heart disease Sister   . Obesity Sister   . Dementia Sister   . Diabetes Sister   . Other Sister        nornmal old age problems    Social History   Socioeconomic History  . Marital status: Married    Spouse name: Not on file  . Number of children: Not on file  . Years of education: Not on file  . Highest education level: Not on file  Occupational History  . Not on file  Tobacco Use  . Smoking status: Never Smoker  . Smokeless tobacco: Never Used  Substance and Sexual Activity  . Alcohol use: No    Alcohol/week: 0.0 standard drinks  . Drug use: No  . Sexual activity: Not on file  Other Topics Concern  . Not on file  Social History Narrative  . Not on file   Social Determinants of Health   Financial Resource Strain:   . Difficulty of Paying Living Expenses:   Food Insecurity:   . Worried About Charity fundraiser in the Last Year:   . Arboriculturist in the Last Year:   Transportation Needs:   . Film/video editor (Medical):   Marland Kitchen Lack of Transportation (Non-Medical):   Physical Activity:   . Days of Exercise per Week:   . Minutes of Exercise per Session:   Stress:   . Feeling of Stress :   Social Connections:   . Frequency of Communication with Friends and Family:   . Frequency of Social Gatherings with Friends and Family:   . Attends Religious Services:   . Active Member of Clubs or Organizations:   . Attends Archivist Meetings:   Marland Kitchen Marital Status:   Intimate Partner Violence:   . Fear of Current or Ex-Partner:   . Emotionally Abused:   Marland Kitchen Physically Abused:   . Sexually Abused:     Current Outpatient Medications  Medication  Sig Dispense Refill  . aspirin 81 MG tablet Take 81 mg by mouth at bedtime.     . Cholecalciferol (VITAMIN D) 2000 UNITS tablet Take 2,000 Units by mouth daily at 12 noon.     Marland Kitchen CINNAMON PO Take 15 mLs by mouth daily.    . finasteride (PROSCAR) 5 MG tablet Take 5 mg by mouth daily at 12 noon.     . hydrochlorothiazide (MICROZIDE) 12.5 MG capsule Take 12.5 mg by mouth daily.    Marland Kitchen levothyroxine (SYNTHROID, LEVOTHROID) 75 MCG tablet Take 75 mcg by mouth daily before breakfast.    . metFORMIN (GLUCOPHAGE) 500 MG tablet Take 500 mg by mouth every evening.     . pravastatin (PRAVACHOL) 20 MG tablet Take 20 mg by mouth at bedtime.     . psyllium (METAMUCIL) 58.6 % packet Take 1 packet by mouth daily.    . ramipril (ALTACE) 5 MG capsule Take 5  mg by mouth daily at 12 noon.     . sodium chloride (OCEAN) 0.65 % SOLN nasal spray Place 1 spray into both nostrils daily as needed for congestion.    . tamsulosin (FLOMAX) 0.4 MG CAPS capsule Take 0.4 mg by mouth daily at 12 noon.      No current facility-administered medications for this visit.    No Known Allergies    Review of Systems:   General:  normal appetite, decreased energy, no weight gain, no weight loss, no fever  Cardiac:  no chest pain with exertion, no chest pain at rest, + SOB with exertion, no resting SOB, no PND, no orthopnea, no palpitations, no arrhythmia, no atrial fibrillation, no LE edema, no dizzy spells, no syncope  Respiratory:  no shortness of breath, no home oxygen, no productive cough, no dry cough, no bronchitis, no wheezing, no hemoptysis, no asthma, no pain with inspiration or cough, no sleep apnea, no CPAP at night  GI:   no difficulty swallowing, no reflux, no frequent heartburn, no hiatal hernia, no abdominal pain, no constipation, no diarrhea, no hematochezia, no hematemesis, no melena  GU:   no dysuria,  + frequency, no urinary tract infection, no hematuria, + enlarged prostate, no kidney stones, no kidney  disease  Vascular:  no pain suggestive of claudication, no pain in feet, no leg cramps, no varicose veins, no DVT, no non-healing foot ulcer  Neuro:   no stroke, no TIA's, no seizures, no headaches, no temporary blindness one eye,  no slurred speech, no peripheral neuropathy, no chronic pain, no instability of gait, + memory/cognitive dysfunction  Musculoskeletal: no arthritis, no joint swelling, no myalgias, no difficulty walking, normal mobility   Skin:   no rash, no itching, no skin infections, no pressure sores or ulcerations  Psych:   + anxiety, + depression, + nervousness, + unusual recent stress  Eyes:   no blurry vision, no floaters, no recent vision changes, no wears glasses or contacts  ENT:   no hearing loss, no loose or painful teeth, no dentures, last saw dentist February 2021  Hematologic:  + easy bruising, no abnormal bleeding, no clotting disorder, no frequent epistaxis  Endocrine:  + diabetes, does check CBG's at home           Physical Exam:   BP (!) 160/70   Pulse 64   Temp 98.4 F (36.9 C) (Skin)   Resp 20   Ht 5\' 9"  (1.753 m)   Wt 143 lb (64.9 kg)   SpO2 98% Comment: RA  BMI 21.12 kg/m   General:  Thin,  well-appearing  HEENT:  Unremarkable   Neck:   no JVD, no bruits, no adenopathy   Chest:   clear to auscultation, symmetrical breath sounds, no wheezes, no rhonchi   CV:   RRR, grade IV/VI crescendo/decrescendo murmur heard best at apex,  no diastolic murmur  Abdomen:  soft, non-tender, no masses   Extremities:  warm, well-perfused, pulses palpable, no LE edema  Rectal/GU  Deferred  Neuro:   Grossly non-focal and symmetrical throughout  Skin:   Clean and dry, no rashes, no breakdown   Diagnostic Tests:  ECHOCARDIOGRAM REPORT       Patient Name:  JACQUE VIRGINIA [facility] Date of Exam: 06/17/2019  Medical Rec #: [id number]   Height:    69.0 in  Accession #:  [id number]   Weight:    143.0 lb  Date of Birth: May 24, 1930   BSA:     1.79  m   Patient Age:  39 years    BP:      160/83 mmHg  Patient Gender: M       HR:      61 bpm.  Exam Location: [facility]   Procedure: 2D Echo, Cardiac Doppler and Color Doppler   Indications:  I05.9 MVD    History:    Patient has prior history of Echocardiogram examinations,  most         recent 05/28/2018. Carotid Disease, Mitral Valve Prolapse;  Risk         Factors:Hypertension and HLD.    Sonographer:  Marygrace Drought RCS  Referring Phys: [place]    1. Left ventricular ejection fraction, by visual estimation, is 60 to  65%. The left ventricle has normal function. There is mildly increased  left ventricular hypertrophy.  2. Elevated left atrial and left ventricular end-diastolic pressures.  3. Left ventricular diastolic parameters are consistent with Grade II  diastolic dysfunction (pseudonormalization).  4. The left ventricle has no regional wall motion abnormalities.  5. Global right ventricle has normal systolic function.The right  ventricular size is normal. No increase in right ventricular wall  thickness.  6. Left atrial size was severely dilated.  7. Right atrial size was normal.  8. Moderate flail of posterior leaflet.  9. Severe mitral valve prolapse.  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 Severe mitral valve regurgitation.  11. The tricuspid valve is grossly normal.  12. The tricuspid valve is grossly normal. Tricuspid valve regurgitation  is not demonstrated.  13. The aortic valve is tricuspid. Aortic valve regurgitation is mild.  Mild aortic valve sclerosis without stenosis.  14. The pulmonic valve was grossly normal. Pulmonic valve regurgitation is  not visualized.  15. Moderately elevated pulmonary artery systolic pressure.  16. The inferior vena cava is normal in size with <50% respiratory  variability, suggesting right atrial pressure of 8 mmHg.  17. There appears to be a  flail, prolapsing posterior leaflet with  possible cord rupture - there is severe, eccentric anteriorly directed  mitral regurgitation. Given the degree of left atrial enlargement, this  may be longstanding. Recommend further  evalution with TEE and multidisciplinary valve clinic evaluation.T  18. Changes from prior study are noted.  19. A prior study was performed on 05/28/2018.  20. MR was severe at that time.   FINDINGS  Left Ventricle: Left ventricular ejection fraction, by visual estimation,  is 60 to 65%. The left ventricle has normal function. The left ventricle  has no regional wall motion abnormalities. There is mildly increased left  ventricular hypertrophy. Left  ventricular diastolic parameters are consistent with Grade II diastolic  dysfunction (pseudonormalization). Elevated left atrial and left  ventricular end-diastolic pressures.   Right Ventricle: The right ventricular size is normal. No increase in  right ventricular wall thickness. Global RV systolic function is has  normal systolic function. The tricuspid regurgitant velocity is 3.08 m/s,  and with an assumed right atrial pressure  of 8 mmHg, the estimated right ventricular systolic pressure is  moderately elevated at 46.0 mmHg.   Left Atrium: Left atrial size was severely dilated.   Right Atrium: Right atrial size was normal in size   Pericardium: There is no evidence of pericardial effusion.   Mitral Valve: The mitral valve is myxomatous. There is severe holosystolic  prolapse of of the mitral valve. Moderate flail of the middle scallop of  the posterior mitral valve leaflet. Severe  mitral valve regurgitation,  with anteriorly-directed jet.  Diastolic mitral regurgitation is absent.   Tricuspid Valve: The tricuspid valve is grossly normal. Tricuspid valve  regurgitation is not demonstrated.   Aortic Valve: The aortic valve is tricuspid. Aortic valve regurgitation is  mild. Aortic regurgitation PHT  measures 483 msec. Mild aortic valve  sclerosis is present, with no evidence of aortic valve stenosis.   Pulmonic Valve: The pulmonic valve was grossly normal. Pulmonic valve  regurgitation is not visualized. Pulmonic regurgitation is not visualized.   Aorta: The aortic root and ascending aorta are structurally normal, with  no evidence of dilitation.   Venous: The inferior vena cava is normal in size with less than 50%  respiratory variability, suggesting right atrial pressure of 8 mmHg.   IAS/Shunts: No atrial level shunt detected by color flow Doppler.   Additional Comments: A prior study was performed on 05/28/2018.    LEFT VENTRICLE  PLAX 2D  LVIDd:     5.94 cm Diastology  LVIDs:     3.32 cm LV e' lateral:  10.20 cm/s  LV PW:     1.15 cm LV E/e' lateral: 12.5  LV IVS:    0.85 cm LV e' medial:  8.05 cm/s  LVOT diam:   2.10 cm LV E/e' medial: 15.9  LV SV:     131 ml  LV SV Index:  73.92  LVOT Area:   3.46 cm     RIGHT VENTRICLE  RV Basal diam: 3.59 cm  RV S prime:   13.90 cm/s  TAPSE (M-mode): 2.5 cm  RVSP:      46.0 mmHg   LEFT ATRIUM       Index    RIGHT ATRIUM      Index  LA diam:    5.00 cm 2.79 cm/m RA Pressure: 8.00 mmHg  LA Vol (A2C):  93.1 ml 51.97 ml/m RA Area:   18.90 cm  LA Vol (A4C):  77.5 ml 43.26 ml/m RA Volume:  58.50 ml 32.65 ml/m  LA Biplane Vol: 95.7 ml 53.42 ml/m  AORTIC VALVE  LVOT Vmax:  87.20 cm/s  LVOT Vmean: 52.100 cm/s  LVOT VTI:  0.181 m  AI PHT:   483 msec    AORTA  Ao Root diam: 3.00 cm   MITRAL VALVE             TRICUSPID VALVE  MV Area (PHT):            TR Peak grad:  38.0 mmHg  MV PHT:               TR Vmax:    364.00 cm/s  MV Decel Time: 173 msec       Estimated RAP: 8.00 mmHg  MR Peak grad:  131.3 mmHg     RVSP:      46.0 mmHg  MR Mean grad:  78.0 mmHg  MR Vmax:     573.00 cm/s      SHUNTS  MR Vmean:    418.0 cm/s     Systemic VTI: 0.18 m  MR PISA:     3.08 cm      Systemic Diam: 2.10 cm  MR PISA Eff ROA: 17 mm  MR PISA Radius: 0.70 cm  MV E velocity: 128.00 cm/s 103 cm/s  MV A velocity: 79.10 cm/s 70.3 cm/s  MV E/A ratio: 1.62    1.5     Lyman Bishop MD  Electronically signed by Lyman Bishop MD  Signature Date/Time:  06/17/2019/3:47:54 PM      TRANSESOPHOGEAL ECHO REPORT       Patient Name:  NIKESH BUSBY [facility] Date of Exam: 09/16/2019  Medical Rec #: [id number]   Height:    69.0 in  Accession #:  [id number]   Weight:    145.1 lb  Date of Birth: March 10, 1931   BSA:     1.803 m  Patient Age:  15 years    BP:      178/62 mmHg  Patient Gender: M       HR:      77 bpm.  Exam Location: Inpatient   Procedure: 3D Echo, Transesophageal Echo, Cardiac Doppler and Color  Doppler   Indications:   Mitral regurgitation    History:     Patient has prior history of Echocardiogram examinations,  most          recent 06/17/2019. Mitral Valve Disease, MR and Mitral  Valve          Prolapse, Signs/Symptoms:Dyspnea; Risk  Factors:Hypertension and          Dyslipidemia.    Sonographer:   Dustin Flock  Referring Phys: [place] Phys: Kirk Ruths MD   PROCEDURE: The transesophogeal probe was passed without difficulty through  the esophogus of the patient. Sedation performed by performing physician.  The patient was monitored while under deep sedation. Anesthestetic  sedation was provided intravenously by  Anesthesiology: 238mg  of Propofol. The patient developed no complications  during the procedure.   IMPRESSIONS    1. Normal LV function; trace AI; flail middle scallop of posterior MV  leaflet with severe, eccentric MR; severe LAE; no LAA thrombus; moderate  TR.  2. Left ventricular ejection fraction, by estimation,  is 55 to 60%. The  left ventricle has normal function. The left ventricle has no regional  wall motion abnormalities.  3. Right ventricular systolic function is normal. The right ventricular  size is normal.  4. Left atrial size was severely dilated. No left atrial/left atrial  appendage thrombus was detected.  5. The mitral valve is abnormal. Severe mitral valve regurgitation.  6. Tricuspid valve regurgitation is moderate.  7. The aortic valve is tricuspid. Aortic valve regurgitation is trivial.  Mild aortic valve sclerosis is present, with no evidence of aortic valve  stenosis.  8. There is mild (Grade II) plaque involving the descending aorta.   FINDINGS  Left Ventricle: Left ventricular ejection fraction, by estimation, is 55  to 60%. The left ventricle has normal function. The left ventricle has no  regional wall motion abnormalities. The left ventricular internal cavity  size was normal in size. There is  no left ventricular hypertrophy.   Right Ventricle: The right ventricular size is normal. Right vetricular  wall thickness was not assessed. Right ventricular systolic function is  normal.   Left Atrium: Left atrial size was severely dilated. No left atrial/left  atrial appendage thrombus was detected.   Right Atrium: Right atrial size was normal in size.   Pericardium: There is no evidence of pericardial effusion.   Mitral Valve: The mitral valve is abnormal. There is severe prolapse of  the middle scallop of the posterior leaflet of the mitral valve. Severe  mitral valve regurgitation.   Tricuspid Valve: The tricuspid valve is normal in structure. Tricuspid  valve regurgitation is moderate.   Aortic Valve: The aortic valve is tricuspid. Aortic valve regurgitation is  trivial. Mild aortic valve sclerosis is present, with no evidence of  aortic valve stenosis.   Pulmonic Valve: The pulmonic valve was normal in structure. Pulmonic valve  regurgitation is  mild.   Aorta: The aortic root is normal in size and structure. There is mild  (Grade II) plaque involving the descending aorta.   IAS/Shunts: No atrial level shunt detected by color flow Doppler.   Additional Comments: Normal LV function; trace AI; flail middle scallop of  posterior MV leaflet with severe, eccentric MR; severe LAE; no LAA  thrombus; moderate TR.   Kirk Ruths MD  Electronically signed by Kirk Ruths MD  Signature Date/Time: 09/16/2019/11:45:06 AM       RIGHT/LEFT HEART CATH AND CORONARY ANGIOGRAPHY  Conclusion   Documented severe mitral regurgitation due to flail of the P2 segment.  Large left atrium.  Mean pulmonary wedge pressure 14 mmHg with V wave spiking to 36 mmHg.  Normal left ventricular systolic function.  MR not graded because LV gram was performed by simple hand injection.  Normal left main  Large left anterior descending that wraps around the left ventricular apex.  Proximal vessel contains eccentric 40% narrowing from ostium to proximal segment.  The mid segment after the dominant diagonal contains eccentric 60 to 70% narrowing.  LAD diagonal forms a [place] 010 stenosis configuration.  Normal circumflex.  Circumflex is relatively small.  Large ramus intermedius gives origin to the large first septal perforator.  Dominant, RCA with minimal proximal luminal irregularities.  RECOMMENDATIONS:   Moderate LAD coronary disease which can be treated medically until or if he develops anginal symptoms.  He is being referred to the valve clinic/Dr. Roxy Manns for consideration of mitral valve surgery/MitraClip.  Recommendations  Antiplatelet/Anticoag Recommend Aspirin 81mg  daily for moderate CAD.  Surgeon Notes    09/16/2019 10:54 AM CV Procedure signed by Belva Crome, MD  Indications  Nonrheumatic mitral valve regurgitation [I34.0 (ICD-10-CM)]  Procedural Details  Technical Details The right radial area was sterilely prepped and draped.  Intravenous sedation with Versed and fentanyl was administered. 1% Xylocaine was infiltrated to achieve local analgesia. Using real-time vascular ultrasound, a double wall stick with an angiocath was utilized to obtain intra-arterial access. A VUS image was saved for the permanent record.The modified Seldinger technique was used to place a 5F " Slender" sheath in the right radial artery. Weight based heparin was administered. Coronary angiography was done using 5 F catheters. Right coronary angiography was performed with a JR4. Left ventricular hemodymic recordings and angiography was done using the JR 4 catheter and hand injection. Left coronary angiography was performed with a JL 3.5 cm.  Right heart catheterization was performed by exchanging a previously placed antecubital IV angio-cath for a 5 French Slender sheath. 1% Xylocaine was used to locally nesthetize the area around the IV site. The IV catheter was wired using an .018 guidewire. The modified Seldinger technique was used to place the 5 [place] sheath. Double glove technique was used to enhance sterility. After sheath insertion, right heart cath was performed using a 5 French balloon tipped catheter and fluoroscopic guidance. Pressures were recorded in each chamber and in the pulmonary capillary wedge position.. The main pulmonary artery O2 saturation was sampled.   Hemostasis was achieved using a pneumatic band.  During this procedure the patient is administered a total of Versed 1.5 mcg and Fentanyl 50 mg to achieve and maintain moderate conscious sedation.  The patient's heart rate, blood pressure, and oxygen saturation are monitored continuously during the procedure. The period of conscious sedation is 35 minutes, of  which I was present face-to-face 100% of this time. Estimated blood loss <50 mL.   During this procedure medications were administered to achieve and maintain moderate conscious sedation while the patient's heart rate, blood  pressure, and oxygen saturation were continuously monitored and I was present face-to-face 100% of this time.  Medications (Filter: Administrations occurring from 09/16/19 0946 to 09/16/19 1054) fentaNYL (SUBLIMAZE) injection (mcg) Total dose:  50 mcg Date/Time  Rate/Dose/Volume Action  09/16/19 0955  25 mcg Given  1006  25 mcg Given    midazolam (VERSED) injection (mg) Total dose:  1.5 mg Date/Time  Rate/Dose/Volume Action  09/16/19 0955  0.5 mg Given  1006  0.5 mg Given  1014  0.5 mg Given    Heparin (Porcine) in NaCl 1000-0.9 UT/500ML-% SOLN (mL) Total volume:  1,000 mL Date/Time  Rate/Dose/Volume Action  09/16/19 0957  500 mL Given  0958  500 mL Given    lidocaine (PF) (XYLOCAINE) 1 % injection (mL) Total volume:  8 mL Date/Time  Rate/Dose/Volume Action  09/16/19 1011  8 mL Given    Radial Cocktail/Verapamil only (mL) Total volume:  10 mL Date/Time  Rate/Dose/Volume Action  09/16/19 1017  10 mL Given    heparin sodium (porcine) injection (Units) Total dose:  3,500 Units Date/Time  Rate/Dose/Volume Action  09/16/19 1026  3,500 Units Given    iohexol (OMNIPAQUE) 350 MG/ML injection (mL) Total volume:  60 mL Date/Time  Rate/Dose/Volume Action  09/16/19 1041  60 mL Given    Sedation Time  Sedation Time Physician-1: 40 minutes 55 seconds  Contrast  Medication Name Total Dose  iohexol (OMNIPAQUE) 350 MG/ML injection 60 mL    Radiation/Fluoro  Fluoro time: 4.5 (min) DAP: 8336 (mGycm2) Cumulative Air Kerma: 144 (mGy)  Coronary Findings  Diagnostic Dominance: Right Left Anterior Descending  Ost LAD to Mid LAD lesion 40% stenosed  Ost LAD to Mid LAD lesion is 40% stenosed.  Mid LAD lesion 65% stenosed  Mid LAD lesion is 65% stenosed.  First Diagonal Branch  Vessel is small in size.  Right Coronary Artery  Prox RCA lesion 20% stenosed  Prox RCA lesion is 20% stenosed.  Intervention  No interventions have been documented. Right Heart  Right Heart  Pressures Hemodynamic findings consistent with pulmonary hypertension. LV EDP is normal.  Wall Motion  Resting               Left Heart  Left Ventricle The left ventricular size is normal. The left ventricular systolic function is normal. LV end diastolic pressure is normal. The left ventricular ejection fraction is 55-65% by visual estimate. No regional wall motion abnormalities.  Coronary Diagrams  Diagnostic Dominance: Right  Intervention  Implants   No implant documentation for this case.  Syngo Images  Show images for CARDIAC CATHETERIZATION  Images on Long Term Storage  Show images for Nasiem, Sidener to Procedure Log  Procedure Log    Hemo Data   Most Recent Value  Fick Cardiac Output 6.62 L/min  Fick Cardiac Output Index 3.67 (L/min)/BSA  RA A Wave 10 mmHg  RA V Wave 0 mmHg  RA Mean 0 mmHg  RV Systolic Pressure 35 mmHg  RV Diastolic Pressure -1 mmHg  RV EDP 4 mmHg  PA Systolic Pressure 40 mmHg  PA Diastolic Pressure 6 mmHg  PA Mean 23 mmHg  PW A Wave 13 mmHg  PW V Wave 36 mmHg  PW Mean 14 mmHg  AO Systolic Pressure 0000000 mmHg  AO Diastolic Pressure 54 mmHg  AO Mean 87 mmHg  LV Systolic Pressure [id number] mmHg  LV Diastolic Pressure 0 mmHg  LV EDP 14 mmHg  AOp Systolic Pressure [id number] mmHg  AOp Diastolic Pressure 55 mmHg  AOp Mean Pressure 89 mmHg  LVp Systolic Pressure [id number] mmHg  LVp Diastolic Pressure 5 mmHg  LVp EDP Pressure 14 mmHg  QP/QS 1  TPVR Index 6.28 HRUI  TSVR Index 23.71 HRUI  TPVR/TSVR Ratio 0.26     Impression:  Patient has mitral valve prolapse with stage D severe symptomatic primary mitral regurgitation.  He has recently noticed a decline in exercise tolerance and a tendency to exertional shortness of breath consistent with chronic diastolic congestive heart failure, New York Heart Association functional class I-II.  I have personally reviewed the patient's recent echocardiograms and diagnostic cardiac catheterization.  Both  transthoracic and transesophageal echocardiogram revealed myxomatous degenerative disease of the mitral valve with an obvious flail segment involving a portion of the posterior leaflet with ruptured primary chordae tendinae.  There is severe mitral regurgitation and moderate to severe left atrial enlargement.  Left ventricular systolic function appears normal.  There is mild left ventricular chamber enlargement.  Diagnostic cardiac catheterization is notable for the absence of significant coronary artery disease.  Right heart pressures were normal although there were large V waves on wedge tracing consistent with severe mitral regurgitation.  I agree the patient would benefit from elective mitral valve repair.  Risks associated with conventional surgery might be somewhat elevated because of the patient's extremely advanced age.  I am also concerned by his recent struggles with anxiety, depression, and some memory loss.   Plan:  The patient and his wife were counseled at length regarding treatment alternatives for management of severe symptomatic mitral regurgitation.  Alternative approaches such as conventional surgical mitral valve repair or replacement with or without minimally-invasive techniques, percutaneous edge-to-edge Mitraclip repair, and continued medical therapy without intervention were compared and contrasted at length.  The risks associated with conventional surgery were discussed in detail, as were expectations for post-operative convalescence, and why I might be somewhat reluctant to consider this patient a candidate for conventional surgery.  Issues specific to Mitraclip repair were discussed including questions about long term freedom from persistent or recurrent mitral regurgitation, the potential for device migration or embolization, and other technical complications related to the procedure itself.  Current availability of other catheter-based treatments for mitral regurgitation was  discussed.  Long-term prognosis with medical therapy was discussed. This discussion was placed in the context of the patient's own specific clinical presentation and past medical history.  All of their questions have been addressed.  The patient wants to think options over before making any decisions.  We have offered to refer the patient to Dr. Burt Knack for consultation to consider transcatheter edge-to-edge repair if desired.    I spent in excess of 90 minutes during the conduct of this office consultation and >50% of this time involved direct face-to-face encounter with the patient for counseling and/or coordination of their care.      Valentina Gu. Roxy Manns, MD 09/22/2019 2:46 PM

## 2019-09-26 NOTE — Addendum Note (Signed)
Addended by: Karena Addison L on: 09/26/2019 08:00 AM   Modules accepted: Orders

## 2019-09-29 ENCOUNTER — Encounter: Payer: Self-pay | Admitting: Cardiovascular Disease

## 2019-09-29 ENCOUNTER — Ambulatory Visit: Payer: Medicare HMO | Admitting: Cardiovascular Disease

## 2019-09-29 ENCOUNTER — Other Ambulatory Visit: Payer: Self-pay

## 2019-09-29 VITALS — BP 152/58 | HR 66 | Ht 69.0 in | Wt 142.6 lb

## 2019-09-29 DIAGNOSIS — I340 Nonrheumatic mitral (valve) insufficiency: Secondary | ICD-10-CM | POA: Diagnosis not present

## 2019-09-29 NOTE — H&P (View-Only) (Signed)
Cardiology Office Note:    Date:  09/29/2019   ID:  Adrian Olson, DOB Jun 08, 1930, MRN [id number]  PCP:  [name], MD  Cardiologist:  [name], MD  Electrophysiologist:  None   Referring MD: [name], MD   Chief Complaint  Patient presents with  . Shortness of Breath    History of Present Illness:    Adrian Olson is a 86 y.o. male referred by Dr. Tamala Julian for evaluation of severe primary mitral regurgitation.  The patient is here with his wife today.  He has a history of longstanding mitral valve prolapse with mitral regurgitation.  The patient dates this back about 15 to 20 years.  He did not have problems when he was a young man and recalls going through physical exams when he was in the service without any problems identified.  He has been asymptomatic until the past year, but more recently has developed progressive exercise intolerance and shortness of breath with activity.  He is still able to walk about 2 miles for exercise but has to go at a slower pace than in the past.  He has shortness of breath and generalized fatigue with this.  He denies orthopnea, PND, leg swelling, or chest pain.  He has had no lightheadedness or syncope.  The patient also admits that he has been struggling more with anxiety and depression over the past year.  He attributes this to aging as well as issues around the COVID-19 pandemic.  The patient is undergone evaluation with a surface echo study followed by a transesophageal echo and a cardiac catheterization.  Cardiac catheterization revealed nonobstructive coronary artery disease with a moderate eccentric LAD lesion.  Hemodynamic findings were notable for a large V wave on the pulmonary capillary wedge pressure tracing confirming severe mitral regurgitation.  Transesophageal echo demonstrated mitral valve prolapse with a large flail segment of the posterior leaflet and severe anteriorly directed mitral regurgitation.  Left atrium is severely  enlarged with no evidence of thrombus.  LV systolic function is felt to be normal with an LVEF of 55 to 60%.  The patient is married and here with his wife today.  They have had 2 children, one still living and she is in [place].  The patient has been retired since the 1990s.    Past Medical History:  Diagnosis Date  . Bilateral carotid artery disease (HCC)    Severe Left and moderate Right  . Diabetes mellitus without complication ([place])   . ED (erectile dysfunction)   . Hyperlipidemia   . Hypertension   . Lumbar disc disease   . Mitral valve prolapse    moderate to severe MR, LVEF 55% Echo 2010  . Moderate mitral regurgitation 03/16/2014    Past Surgical History:  Procedure Laterality Date  . RIGHT/LEFT HEART CATH AND CORONARY ANGIOGRAPHY N/A 09/16/2019   Procedure: RIGHT/LEFT HEART CATH AND CORONARY ANGIOGRAPHY;  Surgeon: Belva Crome, MD;  Location: [facility];  Service: Cardiovascular;  Laterality: N/A;  . TEE WITHOUT CARDIOVERSION N/A 09/16/2019   Procedure: TRANSESOPHAGEAL ECHOCARDIOGRAM (TEE);  Surgeon: Lelon Perla, MD;  Location: [facility] ENDOSCOPY;  Service: Cardiovascular;  Laterality: N/A;    Current Medications: Current Meds  Medication Sig  . aspirin 81 MG tablet Take 81 mg by mouth at bedtime.   . Cholecalciferol (VITAMIN D) 2000 UNITS tablet Take 2,000 Units by mouth daily at 12 noon.   Marland Kitchen CINNAMON PO Take 15 mLs by mouth daily.  Marland Kitchen  finasteride (PROSCAR) 5 MG tablet Take 5 mg by mouth daily at 12 noon.   . hydrochlorothiazide (MICROZIDE) 12.5 MG capsule Take 12.5 mg by mouth daily.  Marland Kitchen levothyroxine (SYNTHROID, LEVOTHROID) 75 MCG tablet Take 75 mcg by mouth daily before breakfast.  . metFORMIN (GLUCOPHAGE) 500 MG tablet Take 500 mg by mouth every evening.   . pravastatin (PRAVACHOL) 20 MG tablet Take 20 mg by mouth at bedtime.   . psyllium (METAMUCIL) 58.6 % packet Take 1 packet by mouth daily.  . ramipril (ALTACE) 5 MG capsule Take 5 mg by mouth daily at  12 noon.   . sodium chloride (OCEAN) 0.65 % SOLN nasal spray Place 1 spray into both nostrils daily as needed for congestion.  . tamsulosin (FLOMAX) 0.4 MG CAPS capsule Take 0.4 mg by mouth daily at 12 noon.      Allergies:   Patient has no known allergies.   Social History   Socioeconomic History  . Marital status: Married    Spouse name: Not on file  . Number of children: Not on file  . Years of education: Not on file  . Highest education level: Not on file  Occupational History  . Not on file  Tobacco Use  . Smoking status: Never Smoker  . Smokeless tobacco: Never Used  Substance and Sexual Activity  . Alcohol use: No    Alcohol/week: 0.0 standard drinks  . Drug use: No  . Sexual activity: Not on file  Other Topics Concern  . Not on file  Social History Narrative  . Not on file   Social Determinants of Health   Financial Resource Strain:   . Difficulty of Paying Living Expenses:   Food Insecurity:   . Worried About Charity fundraiser in the Last Year:   . Arboriculturist in the Last Year:   Transportation Needs:   . Film/video editor (Medical):   Marland Kitchen Lack of Transportation (Non-Medical):   Physical Activity:   . Days of Exercise per Week:   . Minutes of Exercise per Session:   Stress:   . Feeling of Stress :   Social Connections:   . Frequency of Communication with Friends and Family:   . Frequency of Social Gatherings with Friends and Family:   . Attends Religious Services:   . Active Member of Clubs or Organizations:   . Attends Archivist Meetings:   Marland Kitchen Marital Status:      Family History: The patient's family history includes Cancer in his mother; Dementia in his sister; Diabetes in his father and sister; Heart disease in his father and sister; Obesity in his sister; Other in his father and sister; Stroke in his father.  ROS:   Please see the history of present illness.    All other systems reviewed and are negative.  EKGs/Labs/Other  Studies Reviewed:    The following studies were reviewed today: TEE: IMPRESSIONS    1. Normal LV function; trace AI; flail middle scallop of posterior MV  leaflet with severe, eccentric MR; severe LAE; no LAA thrombus; moderate  TR.  2. Left ventricular ejection fraction, by estimation, is 55 to 60%. The  left ventricle has normal function. The left ventricle has no regional  wall motion abnormalities.  3. Right ventricular systolic function is normal. The right ventricular  size is normal.  4. Left atrial size was severely dilated. No left atrial/left atrial  appendage thrombus was detected.  5. The mitral valve is abnormal.  Severe mitral valve regurgitation.  6. Tricuspid valve regurgitation is moderate.  7. The aortic valve is tricuspid. Aortic valve regurgitation is trivial.  Mild aortic valve sclerosis is present, with no evidence of aortic valve  stenosis.  8. There is mild (Grade II) plaque involving the descending aorta.  Echo 06/17/2019: IMPRESSIONS    1. Left ventricular ejection fraction, by visual estimation, is 60 to  65%. The left ventricle has normal function. There is mildly increased  left ventricular hypertrophy.  2. Elevated left atrial and left ventricular end-diastolic pressures.  3. Left ventricular diastolic parameters are consistent with Grade II  diastolic dysfunction (pseudonormalization).  4. The left ventricle has no regional wall motion abnormalities.  5. Global right ventricle has normal systolic function.The right  ventricular size is normal. No increase in right ventricular wall  thickness.  6. Left atrial size was severely dilated.  7. Right atrial size was normal.  8. Moderate flail of posterior leaflet.  9. Severe mitral valve prolapse.  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 Severe mitral valve regurgitation.  11. The tricuspid valve is grossly normal.  12. The tricuspid valve is grossly normal. Tricuspid valve regurgitation    is not demonstrated.  13. The aortic valve is tricuspid. Aortic valve regurgitation is mild.  Mild aortic valve sclerosis without stenosis.  14. The pulmonic valve was grossly normal. Pulmonic valve regurgitation is  not visualized.  15. Moderately elevated pulmonary artery systolic pressure.  16. The inferior vena cava is normal in size with <50% respiratory  variability, suggesting right atrial pressure of 8 mmHg.  17. There appears to be a flail, prolapsing posterior leaflet with  possible cord rupture - there is severe, eccentric anteriorly directed  mitral regurgitation. Given the degree of left atrial enlargement, this  may be longstanding. Recommend further  evalution with TEE and multidisciplinary valve clinic evaluation.T  18. Changes from prior study are noted.  19. A prior study was performed on 05/28/2018.  20. MR was severe at that time.   Cardiac Cath 09/16/19: Conclusion   Documented severe mitral regurgitation due to flail of the P2 segment.  Large left atrium.  Mean pulmonary wedge pressure 14 mmHg with V wave spiking to 36 mmHg.  Normal left ventricular systolic function.  MR not graded because LV gram was performed by simple hand injection.  Normal left main  Large left anterior descending that wraps around the left ventricular apex.  Proximal vessel contains eccentric 40% narrowing from ostium to proximal segment.  The mid segment after the dominant diagonal contains eccentric 60 to 70% narrowing.  LAD diagonal forms a [place] 010 stenosis configuration.  Normal circumflex.  Circumflex is relatively small.  Large ramus intermedius gives origin to the large first septal perforator.  Dominant, RCA with minimal proximal luminal irregularities.  RECOMMENDATIONS:   Moderate LAD coronary disease which can be treated medically until or if he develops anginal symptoms.  He is being referred to the valve clinic/Dr. Roxy Manns for consideration of mitral valve  surgery/MitraClip.    EKG:  EKG is not ordered today.    Recent Labs: 09/08/2019: BUN 15; Creatinine, Ser 1.12; Platelets 191 09/16/2019: Hemoglobin 11.9; Potassium 3.9; Sodium 137  Recent Lipid Panel No results found for: CHOL, TRIG, HDL, CHOLHDL, VLDL, LDLCALC, LDLDIRECT  Physical Exam:    VS:  BP (!) 152/58   Pulse 66   Ht 5\' 9"  (1.753 m)   Wt 142 lb 9.6 oz (64.7 kg)   SpO2 98%   BMI  21.06 kg/m     Wt Readings from Last 3 Encounters:  09/29/19 142 lb 9.6 oz (64.7 kg)  09/22/19 143 lb (64.9 kg)  09/16/19 145 lb (65.8 kg)     GEN:  Well nourished, well developed thin elderly male in no acute distress, appears younger than his stated age 70: Normal NECK: No JVD; No carotid bruits LYMPHATICS: No lymphadenopathy CARDIAC: RRR, 3/6 systolic murmur at the apex RESPIRATORY:  Clear to auscultation without rales, wheezing or rhonchi  ABDOMEN: Soft, non-tender, non-distended MUSCULOSKELETAL:  No edema; No deformity  SKIN: Warm and dry NEUROLOGIC:  Alert and oriented x 3 PSYCHIATRIC:  Normal affect   Mitral Valve Repair: Risk of Mortality: 2.776% Renal Failure: 1.421% Permanent Stroke: 3.872% Prolonged Ventilation: 5.881% DSW Infection: 0.034% Reoperation: 3.906% Morbidity or Mortality: 11.863% Short Length of Stay: 34.532% Long Length of Stay: 6.401%  Mitral Valve Replacement: Risk of Mortality: 3.568% Renal Failure: 2.196% Permanent Stroke: 2.259% Prolonged Ventilation: 10.242% DSW Infection: 0.064% Reoperation: 6.351% Morbidity or Mortality: 17.334% Short Length of Stay: 19.335% Long Length of Stay: 9.738%  ASSESSMENT:    1. Severe mitral regurgitation    PLAN:    In order of problems listed above:  86 year old male with severe, stage D, primary mitral regurgitation with degenerative changes of mitral valve prolapse and a flail segment of the posterior leaflet with ruptured primary chordae tendinae.  Cardiac catheterization and TEE  studies are personally reviewed as outlined in the HPI.  Large V waves confirm the hemodynamic consequences of severe MR which is clearly demonstrated on his transesophageal echo study.  The patient is limited by New York Heart Association functional class II symptoms of exertional dyspnea and fatigue.  The patient is undergone formal cardiac surgical consultation with Dr. Roxy Manns.  We have discussed his case.  The patient has anatomy suitable for transcatheter edge-to-edge mitral valve repair with MitraClip.  We would predict a high likelihood of successful reduction in mitral regurgitation, likely using a large clip because of redundant leaflet tissue. I have reviewed the natural history of mitral regurgitation with the patient and their family members who are present today. We have discussed the limitations of medical therapy and the poor prognosis associated with symptomatic mitral regurgitation. We have also reviewed potential treatment options, including palliative medical therapy, conventional surgical mitral valve repair or replacement, and percutaneous mitral valve therapies such as edge-to-edge mitral valve approximation with MitraClip. We discussed treatment options in the context of this patient's specific comorbid medical conditions.  Considering this patient's advanced age and strong preference for least invasive therapy, transcatheter edge-to-edge mitral valve repair seems appropriate.  The patient is counseled specifically about the risks, indications, and alternatives to percutaneous mitral valve repair with MitraClip. Specific risks include vascular injury, bleeding, infection, arrhythmia, myocardial infarction, stroke, cardiac perforation, cardiac tamponade, device embolization, single leaflet detachment, endocarditis, mitral valve injury, emergency surgery, and death. They understand the risk of serious complication occurs at a rate of approximately 2%. After this discussion, the patient provides  full informed consent for surgery.    Medication Adjustments/Labs and Tests Ordered: Current medicines are reviewed at length with the patient today.  Concerns regarding medicines are outlined above.  No orders of the defined types were placed in this encounter.  No orders of the defined types were placed in this encounter.   Patient Instructions  It was nice to meet you today! Adrian Olson, our Nurse Navigator, will be in touch to arrange furter appointments.     Signed, Sherren Mocha,  MD  09/29/2019 5:16 PM    [place]

## 2019-09-29 NOTE — Progress Notes (Signed)
Cardiology Office Note:    Date:  09/29/2019   ID:  QUINTARIUS DRYSDALE, DOB 02/19/1931, MRN [id number]  PCP:  [name], MD  Cardiologist:  [name], MD  Electrophysiologist:  None   Referring MD: [name], MD   Chief Complaint  Patient presents with  . Shortness of Breath    History of Present Illness:    Adrian Olson is a 86 y.o. male referred by Dr. Tamala Julian for evaluation of severe primary mitral regurgitation.  The patient is here with his wife today.  He has a history of longstanding mitral valve prolapse with mitral regurgitation.  The patient dates this back about 15 to 20 years.  He did not have problems when he was a young man and recalls going through physical exams when he was in the service without any problems identified.  He has been asymptomatic until the past year, but more recently has developed progressive exercise intolerance and shortness of breath with activity.  He is still able to walk about 2 miles for exercise but has to go at a slower pace than in the past.  He has shortness of breath and generalized fatigue with this.  He denies orthopnea, PND, leg swelling, or chest pain.  He has had no lightheadedness or syncope.  The patient also admits that he has been struggling more with anxiety and depression over the past year.  He attributes this to aging as well as issues around the COVID-19 pandemic.  The patient is undergone evaluation with a surface echo study followed by a transesophageal echo and a cardiac catheterization.  Cardiac catheterization revealed nonobstructive coronary artery disease with a moderate eccentric LAD lesion.  Hemodynamic findings were notable for a large V wave on the pulmonary capillary wedge pressure tracing confirming severe mitral regurgitation.  Transesophageal echo demonstrated mitral valve prolapse with a large flail segment of the posterior leaflet and severe anteriorly directed mitral regurgitation.  Left atrium is severely  enlarged with no evidence of thrombus.  LV systolic function is felt to be normal with an LVEF of 55 to 60%.  The patient is married and here with his wife today.  They have had 2 children, one still living and she is in [place].  The patient has been retired since the 1990s.    Past Medical History:  Diagnosis Date  . Bilateral carotid artery disease (HCC)    Severe Left and moderate Right  . Diabetes mellitus without complication ([place])   . ED (erectile dysfunction)   . Hyperlipidemia   . Hypertension   . Lumbar disc disease   . Mitral valve prolapse    moderate to severe MR, LVEF 55% Echo 2010  . Moderate mitral regurgitation 03/16/2014    Past Surgical History:  Procedure Laterality Date  . RIGHT/LEFT HEART CATH AND CORONARY ANGIOGRAPHY N/A 09/16/2019   Procedure: RIGHT/LEFT HEART CATH AND CORONARY ANGIOGRAPHY;  Surgeon: Belva Crome, MD;  Location: [facility];  Service: Cardiovascular;  Laterality: N/A;  . TEE WITHOUT CARDIOVERSION N/A 09/16/2019   Procedure: TRANSESOPHAGEAL ECHOCARDIOGRAM (TEE);  Surgeon: Lelon Perla, MD;  Location: [facility] ENDOS[facility];  Service: Cardiovascular;  Laterality: N/A;    Current Medications: Current Meds  Medication Sig  . aspirin 81 MG tablet Take 81 mg by mouth at bedtime.   . Cholecalciferol (VITAMIN D) 2000 UNITS tablet Take 2,000 Units by mouth daily at 12 noon.   Marland Kitchen CINNAMON PO Take 15 mLs by mouth daily.  Marland Kitchen  finasteride (PROSCAR) 5 MG tablet Take 5 mg by mouth daily at 12 noon.   . hydrochlorothiazide (MICROZIDE) 12.5 MG capsule Take 12.5 mg by mouth daily.  Marland Kitchen levothyroxine (SYNTHROID, LEVOTHROID) 75 MCG tablet Take 75 mcg by mouth daily before breakfast.  . metFORMIN (GLUCOPHAGE) 500 MG tablet Take 500 mg by mouth every evening.   . pravastatin (PRAVACHOL) 20 MG tablet Take 20 mg by mouth at bedtime.   . psyllium (METAMUCIL) 58.6 % packet Take 1 packet by mouth daily.  . ramipril (ALTACE) 5 MG capsule Take 5 mg by mouth daily at  12 noon.   . sodium chloride (OCEAN) 0.65 % SOLN nasal spray Place 1 spray into both nostrils daily as needed for congestion.  . tamsulosin (FLOMAX) 0.4 MG CAPS capsule Take 0.4 mg by mouth daily at 12 noon.      Allergies:   Patient has no known allergies.   Social History   Socioeconomic History  . Marital status: Married    Spouse name: Not on file  . Number of children: Not on file  . Years of education: Not on file  . Highest education level: Not on file  Occupational History  . Not on file  Tobacco Use  . Smoking status: Never Smoker  . Smokeless tobacco: Never Used  Substance and Sexual Activity  . Alcohol use: No    Alcohol/week: 0.0 standard drinks  . Drug use: No  . Sexual activity: Not on file  Other Topics Concern  . Not on file  Social History Narrative  . Not on file   Social Determinants of Health   Financial Resource Strain:   . Difficulty of Paying Living Expenses:   Food Insecurity:   . Worried About Charity fundraiser in the Last Year:   . Arboriculturist in the Last Year:   Transportation Needs:   . Film/video editor (Medical):   Marland Kitchen Lack of Transportation (Non-Medical):   Physical Activity:   . Days of Exercise per Week:   . Minutes of Exercise per Session:   Stress:   . Feeling of Stress :   Social Connections:   . Frequency of Communication with Friends and Family:   . Frequency of Social Gatherings with Friends and Family:   . Attends Religious Services:   . Active Member of Clubs or Organizations:   . Attends Archivist Meetings:   Marland Kitchen Marital Status:      Family History: The patient's family history includes Cancer in his mother; Dementia in his sister; Diabetes in his father and sister; Heart disease in his father and sister; Obesity in his sister; Other in his father and sister; Stroke in his father.  ROS:   Please see the history of present illness.    All other systems reviewed and are negative.  EKGs/Labs/Other  Studies Reviewed:    The following studies were reviewed today: TEE: IMPRESSIONS    1. Normal LV function; trace AI; flail middle scallop of posterior MV  leaflet with severe, eccentric MR; severe LAE; no LAA thrombus; moderate  TR.  2. Left ventricular ejection fraction, by estimation, is 55 to 60%. The  left ventricle has normal function. The left ventricle has no regional  wall motion abnormalities.  3. Right ventricular systolic function is normal. The right ventricular  size is normal.  4. Left atrial size was severely dilated. No left atrial/left atrial  appendage thrombus was detected.  5. The mitral valve is abnormal.  Severe mitral valve regurgitation.  6. Tricuspid valve regurgitation is moderate.  7. The aortic valve is tricuspid. Aortic valve regurgitation is trivial.  Mild aortic valve sclerosis is present, with no evidence of aortic valve  stenosis.  8. There is mild (Grade II) plaque involving the descending aorta.  Echo 06/17/2019: IMPRESSIONS    1. Left ventricular ejection fraction, by visual estimation, is 60 to  65%. The left ventricle has normal function. There is mildly increased  left ventricular hypertrophy.  2. Elevated left atrial and left ventricular end-diastolic pressures.  3. Left ventricular diastolic parameters are consistent with Grade II  diastolic dysfunction (pseudonormalization).  4. The left ventricle has no regional wall motion abnormalities.  5. Global right ventricle has normal systolic function.The right  ventricular size is normal. No increase in right ventricular wall  thickness.  6. Left atrial size was severely dilated.  7. Right atrial size was normal.  8. Moderate flail of posterior leaflet.  9. Severe mitral valve prolapse.  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 Severe mitral valve regurgitation.  11. The tricuspid valve is grossly normal.  12. The tricuspid valve is grossly normal. Tricuspid valve regurgitation    is not demonstrated.  13. The aortic valve is tricuspid. Aortic valve regurgitation is mild.  Mild aortic valve sclerosis without stenosis.  14. The pulmonic valve was grossly normal. Pulmonic valve regurgitation is  not visualized.  15. Moderately elevated pulmonary artery systolic pressure.  16. The inferior vena cava is normal in size with <50% respiratory  variability, suggesting right atrial pressure of 8 mmHg.  17. There appears to be a flail, prolapsing posterior leaflet with  possible cord rupture - there is severe, eccentric anteriorly directed  mitral regurgitation. Given the degree of left atrial enlargement, this  may be longstanding. Recommend further  evalution with TEE and multidisciplinary valve clinic evaluation.T  18. Changes from prior study are noted.  19. A prior study was performed on 05/28/2018.  20. MR was severe at that time.   Cardiac Cath 09/16/19: Conclusion   Documented severe mitral regurgitation due to flail of the P2 segment.  Large left atrium.  Mean pulmonary wedge pressure 14 mmHg with V wave spiking to 36 mmHg.  Normal left ventricular systolic function.  MR not graded because LV gram was performed by simple hand injection.  Normal left main  Large left anterior descending that wraps around the left ventricular apex.  Proximal vessel contains eccentric 40% narrowing from ostium to proximal segment.  The mid segment after the dominant diagonal contains eccentric 60 to 70% narrowing.  LAD diagonal forms a [place] 010 stenosis configuration.  Normal circumflex.  Circumflex is relatively small.  Large ramus intermedius gives origin to the large first septal perforator.  Dominant, RCA with minimal proximal luminal irregularities.  RECOMMENDATIONS:   Moderate LAD coronary disease which can be treated medically until or if he develops anginal symptoms.  He is being referred to the valve clinic/Dr. Roxy Manns for consideration of mitral valve  surgery/MitraClip.    EKG:  EKG is not ordered today.    Recent Labs: 09/08/2019: BUN 15; Creatinine, Ser 1.12; Platelets 191 09/16/2019: Hemoglobin 11.9; Potassium 3.9; Sodium 137  Recent Lipid Panel No results found for: CHOL, TRIG, HDL, CHOLHDL, VLDL, LDLCALC, LDLDIRECT  Physical Exam:    VS:  BP (!) 152/58   Pulse 66   Ht 5\' 9"  (1.753 m)   Wt 142 lb 9.6 oz (64.7 kg)   SpO2 98%   BMI  21.06 kg/m     Wt Readings from Last 3 Encounters:  09/29/19 142 lb 9.6 oz (64.7 kg)  09/22/19 143 lb (64.9 kg)  09/16/19 145 lb (65.8 kg)     GEN:  Well nourished, well developed thin elderly male in no acute distress, appears younger than his stated age 3: Normal NECK: No JVD; No carotid bruits LYMPHATICS: No lymphadenopathy CARDIAC: RRR, 3/6 systolic murmur at the apex RESPIRATORY:  Clear to auscultation without rales, wheezing or rhonchi  ABDOMEN: Soft, non-tender, non-distended MUSCULOSKELETAL:  No edema; No deformity  SKIN: Warm and dry NEUROLOGIC:  Alert and oriented x 3 PSYCHIATRIC:  Normal affect   Mitral Valve Repair: Risk of Mortality: 2.776% Renal Failure: 1.421% Permanent Stroke: 3.872% Prolonged Ventilation: 5.881% DSW Infection: 0.034% Reoperation: 3.906% Morbidity or Mortality: 11.863% Short Length of Stay: 34.532% Long Length of Stay: 6.401%  Mitral Valve Replacement: Risk of Mortality: 3.568% Renal Failure: 2.196% Permanent Stroke: 2.259% Prolonged Ventilation: 10.242% DSW Infection: 0.064% Reoperation: 6.351% Morbidity or Mortality: 17.334% Short Length of Stay: 19.335% Long Length of Stay: 9.738%  ASSESSMENT:    1. Severe mitral regurgitation    PLAN:    In order of problems listed above:  86 year old male with severe, stage D, primary mitral regurgitation with degenerative changes of mitral valve prolapse and a flail segment of the posterior leaflet with ruptured primary chordae tendinae.  Cardiac catheterization and TEE  studies are personally reviewed as outlined in the HPI.  Large V waves confirm the hemodynamic consequences of severe MR which is clearly demonstrated on his transesophageal echo study.  The patient is limited by New York Heart Association functional class II symptoms of exertional dyspnea and fatigue.  The patient is undergone formal cardiac surgical consultation with Dr. Roxy Manns.  We have discussed his case.  The patient has anatomy suitable for transcatheter edge-to-edge mitral valve repair with MitraClip.  We would predict a high likelihood of successful reduction in mitral regurgitation, likely using a large clip because of redundant leaflet tissue. I have reviewed the natural history of mitral regurgitation with the patient and their family members who are present today. We have discussed the limitations of medical therapy and the poor prognosis associated with symptomatic mitral regurgitation. We have also reviewed potential treatment options, including palliative medical therapy, conventional surgical mitral valve repair or replacement, and percutaneous mitral valve therapies such as edge-to-edge mitral valve approximation with MitraClip. We discussed treatment options in the context of this patient's specific comorbid medical conditions.  Considering this patient's advanced age and strong preference for least invasive therapy, transcatheter edge-to-edge mitral valve repair seems appropriate.  The patient is counseled specifically about the risks, indications, and alternatives to percutaneous mitral valve repair with MitraClip. Specific risks include vascular injury, bleeding, infection, arrhythmia, myocardial infarction, stroke, cardiac perforation, cardiac tamponade, device embolization, single leaflet detachment, endocarditis, mitral valve injury, emergency surgery, and death. They understand the risk of serious complication occurs at a rate of approximately 2%. After this discussion, the patient provides  full informed consent for surgery.    Medication Adjustments/Labs and Tests Ordered: Current medicines are reviewed at length with the patient today.  Concerns regarding medicines are outlined above.  No orders of the defined types were placed in this encounter.  No orders of the defined types were placed in this encounter.   Patient Instructions  It was nice to meet you today! Lauren, our Nurse Navigator, will be in touch to arrange furter appointments.     Signed, Sherren Mocha,  MD  09/29/2019 5:16 PM    [place]

## 2019-09-29 NOTE — Patient Instructions (Signed)
It was nice to meet you today! Lauren, our Nurse Navigator, will be in touch to arrange furter appointments.

## 2019-09-30 ENCOUNTER — Other Ambulatory Visit: Payer: Self-pay

## 2019-09-30 DIAGNOSIS — I340 Nonrheumatic mitral (valve) insufficiency: Secondary | ICD-10-CM

## 2019-10-06 ENCOUNTER — Encounter: Payer: Self-pay | Admitting: Physical Therapy

## 2019-10-06 ENCOUNTER — Other Ambulatory Visit: Payer: Self-pay

## 2019-10-06 ENCOUNTER — Ambulatory Visit: Payer: Medicare HMO | Attending: Cardiovascular Disease | Admitting: Physical Therapy

## 2019-10-06 DIAGNOSIS — R2689 Other abnormalities of gait and mobility: Secondary | ICD-10-CM

## 2019-10-06 NOTE — Pre-Procedure Instructions (Addendum)
[place], [place] - [id number] [place] [place] [address] Phone: [phone] Fax: [phone]      Your procedure is scheduled on Thursday May 27th, 2021 at 7:30.  Report to [facility] Main Entrance "A" at 5:30 A.M., and check in at the Admitting office.  Call this number if you have problems the morning of surgery:  [phone]  Call [phone] if you have any questions prior to your surgery date Monday-Friday 8am-4pm    Remember:  Do not eat or drink after midnight the night before your surgery    Take these medicines the morning of surgery with A SIP OF WATER   aspirin EC 81 MG tablet  SYNTHROID, LEVOTHROID) 75 MCG  Ramipril (Altace) 5mg    finasteride (PROSCAR) 5 MG   tamsulosin (Flomax) 0.4 mg   OCEAN) 0.65 % SOLN nasal spray As needed for congestion  Tylenol 500mg  as needed for pain  As of today, STOP taking any Aleve, Naproxen, Ibuprofen, Motrin, Advil, Goody's, BC's, all herbal medications, fish oil, and all vitamins.   WHAT DO I DO ABOUT MY DIABETES MEDICATION?   Marland Kitchen Do not take oral diabetes medicines (metformin) (pills) the morning of surgery.  HOW TO MANAGE YOUR DIABETES BEFORE AND AFTER SURGERY  Why is it important to control my blood sugar before and after surgery? . Improving blood sugar levels before and after surgery helps healing and can limit problems. . A way of improving blood sugar control is eating a healthy diet by: o  Eating less sugar and carbohydrates o  Increasing activity/exercise o  Talking with your doctor about reaching your blood sugar goals . High blood sugars (greater than 180 mg/dL) can raise your risk of infections and slow your recovery, so you will need to focus on controlling your diabetes during the weeks before surgery. . Make sure that the doctor who takes care of your diabetes knows about your planned surgery including the date and location.  How do I manage my  blood sugar before surgery? . Check your blood sugar at least 4 times a day, starting 2 days before surgery, to make sure that the level is not too high or low. . Check your blood sugar the morning of your surgery when you wake up and every 2 hours until you get to the Short Stay unit. o If your blood sugar is less than 70 mg/dL, you will need to treat for low blood sugar: - Do not take insulin. - Treat a low blood sugar (less than 70 mg/dL) with  cup of clear juice (cranberry or apple), 4 glucose tablets, OR glucose gel. - Recheck blood sugar in 15 minutes after treatment (to make sure it is greater than 70 mg/dL). If your blood sugar is not greater than 70 mg/dL on recheck, call [phone] for further instructions. . Report your blood sugar to the short stay nurse when you get to Short Stay.  . If you are admitted to the hospital after surgery: o Your blood sugar will be checked by the staff and you will probably be given insulin after surgery (instead of oral diabetes medicines) to make sure you have good blood sugar levels. o The goal for blood sugar control after surgery is 80-180 mg/dL.                     Do not wear jewelry            Do  not wear lotions, powders, colognes, or deodorant.            Do not shave 48 hours prior to surgery.  Men may shave face and neck.            Do not bring valuables to the hospital.            [facility] is not responsible for any belongings or valuables.  Do NOT Smoke (Tobacco/Vapping) or drink Alcohol 24 hours prior to your procedure If you use a CPAP at night, you may bring all equipment for your overnight stay.   Contacts, glasses, dentures or bridgework may not be worn into surgery.      For patients admitted to the hospital, discharge time will be determined by your treatment team.   Patients discharged the day of surgery will not be allowed to drive home, and someone needs to stay with them for 24 hours.    Special instructions:    [facility]- Preparing For Surgery  Before surgery, you can play an important role. Because skin is not sterile, your skin needs to be as free of germs as possible. You can reduce the number of germs on your skin by washing with CHG (chlorahexidine gluconate) Soap before surgery.  CHG is an antiseptic cleaner which kills germs and bonds with the skin to continue killing germs even after washing.    Oral Hygiene is also important to reduce your risk of infection.  Remember - BRUSH YOUR TEETH THE MORNING OF SURGERY WITH YOUR REGULAR TOOTHPASTE  Please do not use if you have an allergy to CHG or antibacterial soaps. If your skin becomes reddened/irritated stop using the CHG.  Do not shave (including legs and underarms) for at least 48 hours prior to first CHG shower. It is OK to shave your face.  Please follow these instructions carefully.   1. Shower the NIGHT BEFORE SURGERY and the MORNING OF SURGERY with CHG Soap.   2. If you chose to wash your hair, wash your hair first as usual with your normal shampoo.  3. After you shampoo, rinse your hair and body thoroughly to remove the shampoo.  4. Use CHG as you would any other liquid soap. You can apply CHG directly to the skin and wash gently with a scrungie or a clean washcloth.   5. Apply the CHG Soap to your body ONLY FROM THE NECK DOWN.  Do not use on open wounds or open sores. Avoid contact with your eyes, ears, mouth and genitals (private parts). Wash Face and genitals (private parts)  with your normal soap.   6. Wash thoroughly, paying special attention to the area where your surgery will be performed.  7. Thoroughly rinse your body with warm water from the neck down.  8. DO NOT shower/wash with your normal soap after using and rinsing off the CHG Soap.  9. Pat yourself dry with a CLEAN TOWEL.  10. Wear CLEAN PAJAMAS to bed the night before surgery, wear comfortable clothes the morning of surgery  11. Place CLEAN SHEETS on your bed  the night of your first shower and DO NOT SLEEP WITH PETS.   Day of Surgery:   Do not apply any deodorants/lotions.  Please wear clean clothes to the hospital/surgery center.   Remember to brush your teeth WITH YOUR REGULAR TOOTHPASTE.   Please read over the following fact sheets that you were given.

## 2019-10-06 NOTE — Therapy (Signed)
[place], [place], [address] Phone: [phone]   Fax:  [phone]  Physical Therapy Evaluation  Patient Details  Name: Adrian Olson MRN: [id number] Date of Birth: [birth year] Referring Provider (PT): Dr Cecille Aver    Encounter Date: 10/06/2019  PT End of Session - 10/06/19 0946    Visit Number  1    Number of Visits  1    Date for PT Re-Evaluation  10/06/19    Authorization Type  medicare    PT Start Time  0845    PT Stop Time  0927    PT Time Calculation (min)  42 min    Activity Tolerance  Patient tolerated treatment well    Behavior During Therapy  [facility] for tasks assessed/performed       Past Medical History:  Diagnosis Date  . Bilateral carotid artery disease (HCC)    Severe Left and moderate Right  . Diabetes mellitus without complication ([place])   . ED (erectile dysfunction)   . Hyperlipidemia   . Hypertension   . Lumbar disc disease   . Mitral valve prolapse    moderate to severe MR, LVEF 55% Echo 2010  . Moderate mitral regurgitation 03/16/2014    Past Surgical History:  Procedure Laterality Date  . RIGHT/LEFT HEART CATH AND CORONARY ANGIOGRAPHY N/A 09/16/2019   Procedure: RIGHT/LEFT HEART CATH AND CORONARY ANGIOGRAPHY;  Surgeon: Belva Crome, MD;  Location: [place] CV LAB;  Service: Cardiovascular;  Laterality: N/A;  . TEE WITHOUT CARDIOVERSION N/A 09/16/2019   Procedure: TRANSESOPHAGEAL ECHOCARDIOGRAM (TEE);  Surgeon: Lelon Perla, MD;  Location: [facility] ENDOSCOPY;  Service: Cardiovascular;  Laterality: N/A;    There were no vitals filed for this visit.   Subjective Assessment - 10/06/19 0851    Subjective  Patient has noticed about 6 months ago he has been getting more tired ambualting. He walks 2-3 miles a day. He has had a mitral valve regurgitation for years bht it has been becoming worse.    Currently in Pain?  No/denies         [facility] PT Assessment - 10/06/19 0001       Assessment   Medical Diagnosis  Mitral Valve regurgetation.     Referring Provider (PT)  Dr Cecille Aver     Onset Date/Surgical Date  --   6 months prior    Next MD Visit  tomorrow     Prior Therapy  None       Precautions   Precautions  None      Restrictions   Weight Bearing Restrictions  No      Balance Screen   Has the patient fallen in the past 6 months  Yes    How many times?  1    Has the patient had a decrease in activity level because of a fear of falling?   No    Is the patient reluctant to leave their home because of a fear of falling?   No      Prior Function   Level of Independence  Independent    Vocation  Retired      Associate Professor   Overall Cognitive Status  Within Functional Limits for tasks assessed    Attention  Focused    Focused Attention  Appears intact    Memory  Appears intact    Awareness  Appears intact    Problem Solving  Appears intact      Sensation  Light Touch  Appears Intact    Additional Comments  denies parathesias       ROM / Strength   AROM / PROM / Strength  AROM;PROM;Strength      AROM   Overall AROM Comments  full active ROM       Strength   Overall Strength Comments  5/5 strength gross    Strength Assessment Site  Hand    Right/Left hand  Right;Left    Right Hand Grip (lbs)  55    Left Hand Grip (lbs)  55       [facility] Pre-Surgical Assessment - 10/06/19 0001    5 Meter Walk Test- trial 1  6 sec    5 Meter Walk Test- trial 2  5 sec.     5 Meter Walk Test- trial 3  5 sec.    5 meter walk test average  5.33 sec    4 Stage Balance Test tolerated for:   10 sec.    4 Stage Balance Test Position  3    comment  could nopt maintain single leg stance     Sit To Stand Test- trial 1  5 sec.    Comment  13    ADL/IADL Independent with:  Bathing;Dressing;Meal prep;Finances;Yard work    6 Minute Walk- Baseline  yes    BP (mmHg)  134/57    HR (bpm)  65    02 Sat (%RA)  99 %    Modified Borg Scale for Dyspnea  0- Nothing at  all    Perceived Rate of Exertion (Borg)  6-    6 Minute Walk Post Test  yes    BP (mmHg)  166/75    HR (bpm)  75    02 Sat (%RA)  99 %    Modified Borg Scale for Dyspnea  0.5- Very, very slight shortness of breath    Perceived Rate of Exertion (Borg)  9- very light    Aerobic Endurance Distance Walked  1305    Endurance additional comments  no seated rest break 4% disability                 Objective measurements completed on examination: See above findings.              PT Education - 10/06/19 0854    Education Details  purpose of testing    Person(s) Educated  Patient    Methods  Explanation;Demonstration;Tactile cues;Verbal cues    Comprehension  Verbalized understanding;Returned demonstration;Verbal cues required;Tactile cues required                  Plan - 10/06/19 0948    Clinical Impression Statement  See below    Personal Factors and Comorbidities  Comorbidity 1;Comorbidity 2    Comorbidities  DMII, mitral valve regurgitation    Clinical Decision Making  Low    Rehab Potential  Excellent    PT Frequency  One time visit    PT Treatment/Interventions  Patient/family education    Consulted and Agree with Plan of Care  Patient       Patient will benefit from skilled therapeutic intervention in order to improve the following deficits and impairments:  Difficulty walking, Decreased endurance  Visit Diagnosis: Other abnormalities of gait and mobility   Clinical Impression Statement: Pt is a 86 yo male presenting to OP PT for evaluation prior to possible mitral clip surgery due to mitral valve regurgitation. Pt reports onset of fatigue with ambulation  approximately 6 months ago. Symptoms are limiting patients ability to ambulate for exercises. Pt presents with normal ROM and strength, fair balance and is at mild fall risk 4 stage balance test, normal walking speed and decreased aerobic endurance per 6 minute walk test. Pt ambulated 1305 feet  in 6 min . At time of rest, patient's HR was 75 bpm and O2 was 95 on room air. Pt reported .5/10 shortness of breath on modified scale for dyspnea. B/P increased significantly with 6 minute walk test. Based on the Short Physical Performance Battery, patient has a frailty rating of 11/12 with </= 5/12 considered frail.    Problem List Patient Active Problem List   Diagnosis Date Noted  . Mitral prolapse 03/16/2014  . Severe mitral regurgitation 03/16/2014  . Bilateral carotid artery disease ([place]) 03/16/2014  . Essential hypertension 03/16/2014  . Hyperlipidemia 03/16/2014  . Erectile dysfunction 03/16/2014    Carney Living PT DPT  10/06/2019, 9:57 AM  [facility] [address] [place], [place], [address] Phone: [phone]   Fax:  [phone]  Name: Adrian Olson MRN: [id number] Date of Birth: Mar 03, 1931

## 2019-10-07 ENCOUNTER — Encounter (HOSPITAL_COMMUNITY)
Admission: RE | Admit: 2019-10-07 | Discharge: 2019-10-07 | Disposition: A | Discharge status: Home / Self Care | Payer: Medicare HMO | Source: Ambulatory Visit | Attending: Cardiovascular Disease | Admitting: Cardiovascular Disease

## 2019-10-07 ENCOUNTER — Other Ambulatory Visit: Payer: Self-pay

## 2019-10-07 ENCOUNTER — Encounter (HOSPITAL_COMMUNITY): Payer: Self-pay

## 2019-10-07 ENCOUNTER — Ambulatory Visit (HOSPITAL_COMMUNITY)
Admission: RE | Admit: 2019-10-07 | Discharge: 2019-10-07 | Disposition: A | Discharge status: Home / Self Care | Payer: Medicare HMO | Source: Ambulatory Visit | Attending: Cardiovascular Disease | Admitting: Cardiovascular Disease

## 2019-10-07 ENCOUNTER — Other Ambulatory Visit (HOSPITAL_COMMUNITY)
Admission: RE | Admit: 2019-10-07 | Discharge: 2019-10-07 | Disposition: A | Discharge status: Home / Self Care | Payer: Medicare HMO | Source: Ambulatory Visit | Attending: Cardiovascular Disease | Admitting: Cardiovascular Disease

## 2019-10-07 DIAGNOSIS — Z01818 Encounter for other preprocedural examination: Secondary | ICD-10-CM | POA: Diagnosis not present

## 2019-10-07 DIAGNOSIS — I340 Nonrheumatic mitral (valve) insufficiency: Secondary | ICD-10-CM

## 2019-10-07 LAB — BLOOD GAS, ARTERIAL
Acid-base deficit: 0.1 mmol/L (ref 0.0–2.0)
Bicarbonate: 23.9 mmol/L (ref 20.0–28.0)
Drawn by: 421801
FIO2: 21
O2 Saturation: 98.4 %
Patient temperature: 37
pCO2 arterial: 38.3 mmHg (ref 32.0–48.0)
pH, Arterial: 7.412 (ref 7.350–7.450)
pO2, Arterial: 116 mmHg — ABNORMAL HIGH (ref 83.0–108.0)

## 2019-10-07 LAB — URINALYSIS, ROUTINE W REFLEX MICROSCOPIC
Bacteria, UA: NONE SEEN
Bilirubin Urine: NEGATIVE
Glucose, UA: NEGATIVE mg/dL
Ketones, ur: NEGATIVE mg/dL
Leukocytes,Ua: NEGATIVE
Nitrite: NEGATIVE
Protein, ur: NEGATIVE mg/dL
Specific Gravity, Urine: 1.009 (ref 1.005–1.030)
pH: 7 (ref 5.0–8.0)

## 2019-10-07 LAB — COMPREHENSIVE METABOLIC PANEL
ALT: 14 U/L (ref 0–44)
AST: 27 U/L (ref 15–41)
Albumin: 4.3 g/dL (ref 3.5–5.0)
Alkaline Phosphatase: 35 U/L — ABNORMAL LOW (ref 38–126)
Anion gap: 12 (ref 5–15)
BUN: 16 mg/dL (ref 8–23)
CO2: 20 mmol/L — ABNORMAL LOW (ref 22–32)
Calcium: 9.2 mg/dL (ref 8.9–10.3)
Chloride: 100 mmol/L (ref 98–111)
Creatinine, Ser: 1.03 mg/dL (ref 0.61–1.24)
GFR calc Af Amer: >60 mL/min (ref >60)
GFR calc non Af Amer: >60 mL/min (ref >60)
Glucose, Bld: 117 mg/dL — ABNORMAL HIGH (ref 70–99)
Potassium: 4.5 mmol/L (ref 3.5–5.1)
Sodium: 132 mmol/L — ABNORMAL LOW (ref 135–145)
Total Bilirubin: 1 mg/dL (ref 0.3–1.2)
Total Protein: 7.4 g/dL (ref 6.5–8.1)

## 2019-10-07 LAB — SURGICAL PCR SCREEN
MRSA, PCR: NEGATIVE
Staphylococcus aureus: NEGATIVE

## 2019-10-07 LAB — TYPE AND SCREEN
ABO/RH(D): A POS
Antibody Screen: NEGATIVE

## 2019-10-07 LAB — CBC
HCT: 37.5 % — ABNORMAL LOW (ref 39.0–52.0)
Hemoglobin: 12.3 g/dL — ABNORMAL LOW (ref 13.0–17.0)
MCH: 31.8 pg (ref 26.0–34.0)
MCHC: 32.8 g/dL (ref 30.0–36.0)
MCV: 96.9 fL (ref 80.0–100.0)
Platelets: 174 10*3/uL (ref 150–400)
RBC: 3.87 MIL/uL — ABNORMAL LOW (ref 4.22–5.81)
RDW: 12.3 % (ref 11.5–15.5)
WBC: 6.8 10*3/uL (ref 4.0–10.5)
nRBC: 0 % (ref 0.0–0.2)

## 2019-10-07 LAB — SARS CORONAVIRUS 2 (TAT 6-24 HRS): SARS Coronavirus 2: NEGATIVE

## 2019-10-07 LAB — HEMOGLOBIN A1C
Hgb A1c MFr Bld: 6.5 % — ABNORMAL HIGH (ref 4.8–5.6)
Mean Plasma Glucose: 139.85 mg/dL

## 2019-10-07 LAB — ABO/RH: ABO/RH(D): A POS

## 2019-10-07 LAB — PROTIME-INR
INR: 1.1 (ref 0.8–1.2)
Prothrombin Time: 13.8 seconds (ref 11.4–15.2)

## 2019-10-07 LAB — GLUCOSE, CAPILLARY: Glucose-Capillary: 146 mg/dL — ABNORMAL HIGH (ref 70–99)

## 2019-10-07 LAB — APTT: aPTT: 32 seconds (ref 24–36)

## 2019-10-07 LAB — BRAIN NATRIURETIC PEPTIDE: B Natriuretic Peptide: 245.8 pg/mL — ABNORMAL HIGH (ref 0.0–100.0)

## 2019-10-07 NOTE — Progress Notes (Signed)
PCP - Dr. [name] with [facility]  Cardiologist - Dr. Daneen Schick  Chest x-ray - 10/07/19 EKG - 09/16/19 Stress Test - Denies Patient states he walks 2-3 miles/day ECHO - 06/17/19 Cardiac Cath -09/16/19   Sleep Study - Denies  DM- Type II Fasting Blood Sugar - 130 per patient / 145 on arrival of PAT appt Checks Blood Sugar __2-3__ times a week  Aspirin Instructions: May take day of surgery per Dr. Antionette Char orders  COVID TEST- 10/07/19  Anesthesia review: Yes HTN recent Cardiac cath 09/16/19  Patient denies shortness of breath, fever, cough and chest pain at PAT appointment  All instructions explained to the patient, with a verbal understanding of the material. Patient agrees to go over the instructions while at home for a better understanding. Patient also instructed to self quarantine after being tested for COVID-19. The opportunity to ask questions was provided.

## 2019-10-08 DIAGNOSIS — R69 Illness, unspecified: Secondary | ICD-10-CM | POA: Diagnosis not present

## 2019-10-08 NOTE — Anesthesia Preprocedure Evaluation (Addendum)
Anesthesia Evaluation  Patient identified by MRN, date of birth, ID band Patient awake    Reviewed: Allergy & Precautions, NPO status , Patient's Chart, lab work & pertinent test results  History of Anesthesia Complications Negative for: history of anesthetic complications  Airway Mallampati: II  TM Distance: >3 FB Neck ROM: Full    Dental no notable dental hx. (+) Dental Advisory Given   Pulmonary neg pulmonary ROS,    Pulmonary exam normal        Cardiovascular hypertension, Pt. on medications  Rhythm:Regular Rate:Normal + Systolic murmurs  Documented severe mitral regurgitation due to flail of the P2 segment.  Large left atrium.  Mean pulmonary wedge pressure 14 mmHg with V wave spiking to 36 mmHg.  Normal left ventricular systolic function.  MR not graded because LV gram was performed by simple hand injection.  Normal left main  Large left anterior descending that wraps around the left ventricular apex.  Proximal vessel contains eccentric 40% narrowing from ostium to proximal segment.  The mid segment after the dominant diagonal contains eccentric 60 to 70% narrowing.  LAD diagonal forms a [place] 010 stenosis configuration.  Normal circumflex.  Circumflex is relatively small.  Large ramus intermedius gives origin to the large first septal perforator.  Dominant, RCA with minimal proximal luminal irregularities.  RECOMMENDATIONS:   Moderate LAD coronary disease which can be treated medically until or if he develops anginal symptoms.  IMPRESSIONS    1. Normal LV function; trace AI; flail middle scallop of posterior MV  leaflet with severe, eccentric MR; severe LAE; no LAA thrombus; moderate  TR.  2. Left ventricular ejection fraction, by estimation, is 55 to 60%. The  left ventricle has normal function. The left ventricle has no regional  wall motion abnormalities.  3. Right ventricular systolic function is  normal. The right ventricular  size is normal.  4. Left atrial size was severely dilated. No left atrial/left atrial  appendage thrombus was detected.  5. The mitral valve is abnormal. Severe mitral valve regurgitation.  6. Tricuspid valve regurgitation is moderate.  7. The aortic valve is tricuspid. Aortic valve regurgitation is trivial.  Mild aortic valve sclerosis is present, with no evidence of aortic valve  stenosis.  8. There is mild (Grade II) plaque involving the descending aorta.    Neuro/Psych negative neurological ROS  negative psych ROS   GI/Hepatic negative GI ROS, Neg liver ROS,   Endo/Other  diabetesHypothyroidism   Renal/GU negative Renal ROS  negative genitourinary   Musculoskeletal negative musculoskeletal ROS (+)   Abdominal   Peds negative pediatric ROS (+)  Hematology negative hematology ROS (+)   Anesthesia Other Findings   Reproductive/Obstetrics negative OB ROS                            Anesthesia Physical  Anesthesia Plan  ASA: IV  Anesthesia Plan: General   Post-op Pain Management:    Induction: Intravenous  PONV Risk Score and Plan: 1  Airway Management Planned: Oral ETT  Additional Equipment: Arterial line  Intra-op Plan:   Post-operative Plan: Extubation in OR  Informed Consent: I have reviewed the patients History and Physical, chart, labs and discussed the procedure including the risks, benefits and alternatives for the proposed anesthesia with the patient or authorized representative who has indicated his/her understanding and acceptance.     Dental advisory given  Plan Discussed with: Anesthesiologist and CRNA  Anesthesia Plan Comments:  Anesthesia Quick Evaluation  

## 2019-10-09 ENCOUNTER — Inpatient Hospital Stay (HOSPITAL_COMMUNITY): Payer: Medicare HMO | Admitting: Anesthesiology

## 2019-10-09 ENCOUNTER — Inpatient Hospital Stay (HOSPITAL_COMMUNITY)
Admission: RE | Admit: 2019-10-09 | Discharge: 2019-10-09 | Disposition: A | Discharge status: Home / Self Care | Payer: Medicare HMO | Source: Home / Self Care | Attending: Cardiovascular Disease | Admitting: Cardiovascular Disease

## 2019-10-09 ENCOUNTER — Encounter (HOSPITAL_COMMUNITY)
Admission: RE | Disposition: A | Discharge status: Home / Self Care | Payer: Self-pay | Source: Home / Self Care | Attending: Cardiovascular Disease

## 2019-10-09 ENCOUNTER — Encounter (HOSPITAL_COMMUNITY): Payer: Self-pay | Admitting: Cardiovascular Disease

## 2019-10-09 ENCOUNTER — Inpatient Hospital Stay (HOSPITAL_COMMUNITY): Payer: Medicare HMO | Admitting: Physician Assistant

## 2019-10-09 ENCOUNTER — Other Ambulatory Visit: Payer: Self-pay

## 2019-10-09 ENCOUNTER — Inpatient Hospital Stay (HOSPITAL_COMMUNITY)
Admission: RE | Admit: 2019-10-09 | Discharge: 2019-10-10 | DRG: 266 | Disposition: A | Discharge status: Home / Self Care | Payer: Medicare HMO | Attending: Cardiovascular Disease | Admitting: Cardiovascular Disease

## 2019-10-09 DIAGNOSIS — Z7984 Long term (current) use of oral hypoglycemic drugs: Secondary | ICD-10-CM

## 2019-10-09 DIAGNOSIS — I779 Disorder of arteries and arterioles, unspecified: Secondary | ICD-10-CM | POA: Diagnosis present

## 2019-10-09 DIAGNOSIS — I10 Essential (primary) hypertension: Secondary | ICD-10-CM | POA: Diagnosis not present

## 2019-10-09 DIAGNOSIS — I341 Nonrheumatic mitral (valve) prolapse: Secondary | ICD-10-CM | POA: Diagnosis not present

## 2019-10-09 DIAGNOSIS — I340 Nonrheumatic mitral (valve) insufficiency: Secondary | ICD-10-CM

## 2019-10-09 DIAGNOSIS — N529 Male erectile dysfunction, unspecified: Secondary | ICD-10-CM | POA: Diagnosis present

## 2019-10-09 DIAGNOSIS — Z006 Encounter for examination for normal comparison and control in clinical research program: Secondary | ICD-10-CM | POA: Diagnosis not present

## 2019-10-09 DIAGNOSIS — I5033 Acute on chronic diastolic (congestive) heart failure: Secondary | ICD-10-CM | POA: Diagnosis not present

## 2019-10-09 DIAGNOSIS — F419 Anxiety disorder, unspecified: Secondary | ICD-10-CM | POA: Diagnosis present

## 2019-10-09 DIAGNOSIS — I2510 Atherosclerotic heart disease of native coronary artery without angina pectoris: Secondary | ICD-10-CM | POA: Diagnosis not present

## 2019-10-09 DIAGNOSIS — Z7982 Long term (current) use of aspirin: Secondary | ICD-10-CM | POA: Diagnosis not present

## 2019-10-09 DIAGNOSIS — Z9889 Other specified postprocedural states: Secondary | ICD-10-CM

## 2019-10-09 DIAGNOSIS — I5032 Chronic diastolic (congestive) heart failure: Secondary | ICD-10-CM

## 2019-10-09 DIAGNOSIS — I110 Hypertensive heart disease with heart failure: Secondary | ICD-10-CM | POA: Diagnosis present

## 2019-10-09 DIAGNOSIS — E119 Type 2 diabetes mellitus without complications: Secondary | ICD-10-CM | POA: Diagnosis present

## 2019-10-09 DIAGNOSIS — F329 Major depressive disorder, single episode, unspecified: Secondary | ICD-10-CM | POA: Diagnosis present

## 2019-10-09 DIAGNOSIS — E785 Hyperlipidemia, unspecified: Secondary | ICD-10-CM | POA: Diagnosis not present

## 2019-10-09 DIAGNOSIS — R69 Illness, unspecified: Secondary | ICD-10-CM | POA: Diagnosis not present

## 2019-10-09 DIAGNOSIS — Z20822 Contact with and (suspected) exposure to covid-19: Secondary | ICD-10-CM | POA: Diagnosis not present

## 2019-10-09 DIAGNOSIS — Z95818 Presence of other cardiac implants and grafts: Secondary | ICD-10-CM

## 2019-10-09 DIAGNOSIS — Z79890 Hormone replacement therapy (postmenopausal): Secondary | ICD-10-CM | POA: Diagnosis not present

## 2019-10-09 DIAGNOSIS — Z954 Presence of other heart-valve replacement: Secondary | ICD-10-CM | POA: Diagnosis not present

## 2019-10-09 DIAGNOSIS — Z833 Family history of diabetes mellitus: Secondary | ICD-10-CM | POA: Diagnosis not present

## 2019-10-09 DIAGNOSIS — Z79899 Other long term (current) drug therapy: Secondary | ICD-10-CM

## 2019-10-09 DIAGNOSIS — Z8249 Family history of ischemic heart disease and other diseases of the circulatory system: Secondary | ICD-10-CM | POA: Diagnosis not present

## 2019-10-09 DIAGNOSIS — E039 Hypothyroidism, unspecified: Secondary | ICD-10-CM | POA: Diagnosis not present

## 2019-10-09 HISTORY — DX: Presence of other cardiac implants and grafts: Z95.818

## 2019-10-09 HISTORY — PX: MITRAL VALVE REPAIR: CATH118311

## 2019-10-09 HISTORY — PX: MITRAL VALVE REPAIR: SHX2039

## 2019-10-09 HISTORY — DX: Other specified postprocedural states: Z98.890

## 2019-10-09 LAB — POCT ACTIVATED CLOTTING TIME
Activated Clotting Time: 219 seconds
Activated Clotting Time: 279 seconds
Activated Clotting Time: 241 seconds

## 2019-10-09 LAB — GLUCOSE, CAPILLARY: Glucose-Capillary: 137 mg/dL — ABNORMAL HIGH (ref 70–99)

## 2019-10-09 SURGERY — MITRAL VALVE REPAIR
Anesthesia: General

## 2019-10-09 MED ORDER — SODIUM CHLORIDE 0.9% FLUSH: 3.0000 mL | INTRAVENOUS | Status: DC | PRN

## 2019-10-09 MED ORDER — LABETALOL HCL 5 MG/ML IV SOLN
INTRAVENOUS | Status: DC | PRN
Start: 2019-10-09 — End: 2019-10-09
  Administered 2019-10-09 (×2): 10 mg via INTRAVENOUS

## 2019-10-09 MED ORDER — ROCURONIUM BROMIDE 10 MG/ML (PF) SYRINGE
PREFILLED_SYRINGE | INTRAVENOUS | Status: DC | PRN
  Administered 2019-10-09: 50 mg via INTRAVENOUS
  Administered 2019-10-09: 30 mg via INTRAVENOUS

## 2019-10-09 MED ORDER — FENTANYL CITRATE (PF) 250 MCG/5ML IJ SOLN
INTRAMUSCULAR | Status: DC | PRN
  Administered 2019-10-09 (×2): 50 ug via INTRAVENOUS

## 2019-10-09 MED ORDER — CHLORHEXIDINE GLUCONATE 4 % EX LIQD: 60.0000 mL | Freq: Once | CUTANEOUS | Status: DC

## 2019-10-09 MED ORDER — CHLORHEXIDINE GLUCONATE CLOTH 2 % EX PADS: 6.0000 | MEDICATED_PAD | Freq: Every day | CUTANEOUS | Status: DC

## 2019-10-09 MED ORDER — EPHEDRINE SULFATE-NACL 50-0.9 MG/10ML-% IV SOSY
PREFILLED_SYRINGE | INTRAVENOUS | Status: DC | PRN
  Administered 2019-10-09 (×3): 5 mg via INTRAVENOUS
  Administered 2019-10-09: 10 mg via INTRAVENOUS

## 2019-10-09 MED ORDER — HYDROCHLOROTHIAZIDE 12.5 MG PO CAPS
12.5000 mg | ORAL_CAPSULE | Freq: Every day | ORAL | Status: DC
  Administered 2019-10-10: 12.5 mg via ORAL
  Filled 2019-10-09: qty 1

## 2019-10-09 MED ORDER — SODIUM CHLORIDE 0.9 % IV SOLN
1.5000 g | INTRAVENOUS | Status: AC
  Administered 2019-10-09: 1.5 g via INTRAVENOUS
  Filled 2019-10-09: qty 1.5

## 2019-10-09 MED ORDER — LEVOTHYROXINE SODIUM 75 MCG PO TABS
75.0000 ug | ORAL_TABLET | Freq: Every day | ORAL | Status: DC
  Administered 2019-10-10: 75 ug via ORAL
  Filled 2019-10-09: qty 1

## 2019-10-09 MED ORDER — PRAVASTATIN SODIUM 10 MG PO TABS
20.0000 mg | ORAL_TABLET | Freq: Every day | ORAL | Status: DC
  Administered 2019-10-09: 20 mg via ORAL
  Filled 2019-10-09: qty 2

## 2019-10-09 MED ORDER — VANCOMYCIN HCL 1250 MG/250ML IV SOLN
1250.0000 mg | INTRAVENOUS | Status: AC
  Administered 2019-10-09: 1250 mg via INTRAVENOUS
  Filled 2019-10-09 (×3): qty 250

## 2019-10-09 MED ORDER — HEPARIN (PORCINE) IN NACL 1000-0.9 UT/500ML-% IV SOLN: INTRAVENOUS | Status: DC | PRN

## 2019-10-09 MED ORDER — HEPARIN SODIUM (PORCINE) 1000 UNIT/ML IJ SOLN
INTRAMUSCULAR | Status: DC | PRN
Start: 2019-10-09 — End: 2019-10-09
  Administered 2019-10-09: 3000 [IU] via INTRAVENOUS
  Administered 2019-10-09 (×2): 2000 [IU] via INTRAVENOUS
  Administered 2019-10-09: 6000 [IU] via INTRAVENOUS

## 2019-10-09 MED ORDER — HEPARIN (PORCINE) IN NACL 2000-0.9 UNIT/L-% IV SOLN
INTRAVENOUS | Status: AC
  Filled 2019-10-09: qty 2000

## 2019-10-09 MED ORDER — CLOPIDOGREL BISULFATE 75 MG PO TABS
75.0000 mg | ORAL_TABLET | Freq: Every day | ORAL | Status: DC
  Administered 2019-10-10: 75 mg via ORAL
  Filled 2019-10-09: qty 1

## 2019-10-09 MED ORDER — CHLORHEXIDINE GLUCONATE 0.12 % MT SOLN
15.0000 mL | Freq: Once | OROMUCOSAL | Status: AC
  Administered 2019-10-09: 15 mL via OROMUCOSAL
  Filled 2019-10-09: qty 15

## 2019-10-09 MED ORDER — DEXAMETHASONE SODIUM PHOSPHATE 10 MG/ML IJ SOLN
INTRAMUSCULAR | Status: DC | PRN
  Administered 2019-10-09: 4 mg via INTRAVENOUS

## 2019-10-09 MED ORDER — ONDANSETRON HCL 4 MG/2ML IJ SOLN
INTRAMUSCULAR | Status: DC | PRN
  Administered 2019-10-09: 4 mg via INTRAVENOUS

## 2019-10-09 MED ORDER — ACETAMINOPHEN 325 MG PO TABS: 650.0000 mg | ORAL_TABLET | ORAL | Status: DC | PRN

## 2019-10-09 MED ORDER — ORAL CARE MOUTH RINSE: 15.0000 mL | Freq: Once | OROMUCOSAL | Status: AC

## 2019-10-09 MED ORDER — MELATONIN 5 MG PO TABS
5.0000 mg | ORAL_TABLET | Freq: Every evening | ORAL | Status: DC | PRN
  Administered 2019-10-09: 5 mg via ORAL
  Filled 2019-10-09: qty 1

## 2019-10-09 MED ORDER — PHENYLEPHRINE HCL-NACL 10-0.9 MG/250ML-% IV SOLN
INTRAVENOUS | Status: DC | PRN
  Administered 2019-10-09: 15 ug/min via INTRAVENOUS

## 2019-10-09 MED ORDER — SODIUM CHLORIDE 0.9 % IV SOLN: INTRAVENOUS | Status: DC

## 2019-10-09 MED ORDER — LACTATED RINGERS IV SOLN
INTRAVENOUS | Status: DC | PRN
Start: 2019-10-09 — End: 2019-10-09

## 2019-10-09 MED ORDER — RAMIPRIL 5 MG PO CAPS
5.0000 mg | ORAL_CAPSULE | Freq: Every day | ORAL | Status: DC
  Administered 2019-10-10: 5 mg via ORAL
  Filled 2019-10-09: qty 1

## 2019-10-09 MED ORDER — TAMSULOSIN HCL 0.4 MG PO CAPS
0.4000 mg | ORAL_CAPSULE | Freq: Every day | ORAL | Status: DC
  Administered 2019-10-10: 0.4 mg via ORAL
  Filled 2019-10-09: qty 1

## 2019-10-09 MED ORDER — PHENYLEPHRINE 40 MCG/ML (10ML) SYRINGE FOR IV PUSH (FOR BLOOD PRESSURE SUPPORT)
PREFILLED_SYRINGE | INTRAVENOUS | Status: DC | PRN
  Administered 2019-10-09: 80 ug via INTRAVENOUS
  Administered 2019-10-09 (×3): 40 ug via INTRAVENOUS
  Administered 2019-10-09: 80 ug via INTRAVENOUS

## 2019-10-09 MED ORDER — PROTAMINE SULFATE 10 MG/ML IV SOLN
INTRAVENOUS | Status: DC | PRN
Start: 2019-10-09 — End: 2019-10-09
  Administered 2019-10-09: 10 mg via INTRAVENOUS
  Administered 2019-10-09: 15 mg via INTRAVENOUS
  Administered 2019-10-09: 25 mg via INTRAVENOUS

## 2019-10-09 MED ORDER — FINASTERIDE 5 MG PO TABS
5.0000 mg | ORAL_TABLET | Freq: Every day | ORAL | Status: DC
  Administered 2019-10-10: 5 mg via ORAL
  Filled 2019-10-09: qty 1

## 2019-10-09 MED ORDER — ONDANSETRON HCL 4 MG/2ML IJ SOLN: 4.0000 mg | Freq: Four times a day (QID) | INTRAMUSCULAR | Status: DC | PRN

## 2019-10-09 MED ORDER — VITAMIN D 25 MCG (1000 UNIT) PO TABS
2000.0000 [IU] | ORAL_TABLET | Freq: Every day | ORAL | Status: DC
  Administered 2019-10-10: 2000 [IU] via ORAL
  Filled 2019-10-09: qty 2

## 2019-10-09 MED ORDER — ASPIRIN EC 81 MG PO TBEC
81.0000 mg | DELAYED_RELEASE_TABLET | Freq: Every day | ORAL | Status: DC
  Administered 2019-10-10: 81 mg via ORAL
  Filled 2019-10-09: qty 1

## 2019-10-09 MED ORDER — CHLORHEXIDINE GLUCONATE 4 % EX LIQD: 30.0000 mL | CUTANEOUS | Status: DC

## 2019-10-09 MED ORDER — SODIUM CHLORIDE 0.9 % IV SOLN: 250.0000 mL | INTRAVENOUS | Status: DC | PRN

## 2019-10-09 MED ORDER — SODIUM CHLORIDE 0.9% FLUSH
3.0000 mL | Freq: Two times a day (BID) | INTRAVENOUS | Status: DC
  Administered 2019-10-09 – 2019-10-10 (×3): 3 mL via INTRAVENOUS

## 2019-10-09 MED ORDER — METFORMIN HCL 500 MG PO TABS
500.0000 mg | ORAL_TABLET | Freq: Every evening | ORAL | Status: DC
  Administered 2019-10-09: 500 mg via ORAL
  Filled 2019-10-09: qty 1

## 2019-10-09 MED ORDER — CHLORHEXIDINE GLUCONATE 0.12 % MT SOLN
15.0000 mL | Freq: Once | OROMUCOSAL | Status: AC
  Administered 2019-10-09: 15 mL via OROMUCOSAL

## 2019-10-09 MED ORDER — SUGAMMADEX SODIUM 200 MG/2ML IV SOLN
INTRAVENOUS | Status: DC | PRN
  Administered 2019-10-09: 130 mg via INTRAVENOUS

## 2019-10-09 MED ORDER — HEPARIN (PORCINE) IN NACL 2000-0.9 UNIT/L-% IV SOLN
INTRAVENOUS | Status: DC | PRN
  Administered 2019-10-09 (×4): 1000 mL

## 2019-10-09 MED ORDER — LIDOCAINE 2% (20 MG/ML) 5 ML SYRINGE
INTRAMUSCULAR | Status: DC | PRN
  Administered 2019-10-09: 100 mg via INTRAVENOUS

## 2019-10-09 MED ORDER — LACTATED RINGERS IV SOLN: INTRAVENOUS | Status: DC | PRN

## 2019-10-09 SURGICAL SUPPLY — 18 items
BLANKET WARM UNDERBOD FULL ACC (MISCELLANEOUS) ×1 IMPLANT
CATH MITRA STEERABLE GUIDE (CATHETERS) ×1 IMPLANT
CLIP MITRA G4 DELIVERY SYS XTW (Clip) ×2 IMPLANT
DEVICE CLOSURE PERCLS PRGLD 6F (VASCULAR PRODUCTS) IMPLANT
KIT DILATOR VASC 18G NDL (KITS) ×1 IMPLANT
KIT HEART LEFT (KITS) ×4 IMPLANT
KIT VERSACROSS FXD (WIRE) ×1 IMPLANT
PACK CARDIAC CATHETERIZATION (CUSTOM PROCEDURE TRAY) ×2 IMPLANT
PERCLOSE PROGLIDE 6F (VASCULAR PRODUCTS) ×4
SHEATH PINNACLE 8F 10CM (SHEATH) ×1 IMPLANT
SHEATH PROBE COVER 6X72 (BAG) ×4 IMPLANT
SHIELD RADPAD SCOOP 12X17 (MISCELLANEOUS) ×2 IMPLANT
STOPCOCK MORSE 400PSI 3WAY (MISCELLANEOUS) ×12 IMPLANT
SYSTEM MITRACLIP G4 (SYSTAGENIX WOUND MANAGEMENT) ×1 IMPLANT
TRANSDUCER W/STOPCOCK (MISCELLANEOUS) ×2 IMPLANT
TUBING ART PRESS 72  MALE/FEM (TUBING) ×2
TUBING ART PRESS 72 MALE/FEM (TUBING) ×1 IMPLANT
TUBING CIL FLEX 10 FLL-RA (TUBING) ×1 IMPLANT

## 2019-10-09 NOTE — Plan of Care (Signed)
  Problem: Education: Goal: Knowledge of General Education information will improve Description: Including pain rating scale, medication(s)/side effects and non-pharmacologic comfort measures Outcome: Progressing   Problem: Health Behavior/Discharge Planning: Goal: Ability to manage health-related needs will improve Outcome: Progressing   Problem: Clinical Measurements: Goal: Ability to maintain clinical measurements within normal limits will improve Outcome: Progressing   Problem: Clinical Measurements: Goal: Cardiovascular complication will be avoided Outcome: Progressing   Problem: Activity: Goal: Risk for activity intolerance will decrease Outcome: Progressing   Problem: Nutrition: Goal: Adequate nutrition will be maintained Outcome: Progressing   Problem: Pain Managment: Goal: General experience of comfort will improve Outcome: Progressing   Problem: Safety: Goal: Ability to remain free from injury will improve Outcome: Progressing   Problem: Skin Integrity: Goal: Risk for impaired skin integrity will decrease Outcome: Progressing   

## 2019-10-09 NOTE — Transfer of Care (Signed)
Immediate Anesthesia Transfer of Care Note  Patient: Adrian Olson  Procedure(s) Performed: MITRAL VALVE REPAIR (N/A )  Patient Location: PACU and Cath Lab  Anesthesia Type:General  Level of Consciousness: awake and patient cooperative  Airway & Oxygen Therapy: Patient Spontanous Breathing and Patient connected to nasal cannula oxygen  Post-op Assessment: Report given to RN and Post -op Vital signs reviewed and stable  Post vital signs: Reviewed and stable  Last Vitals:  Vitals Value Taken Time  BP    Temp    Pulse    Resp    SpO2      Last Pain:  Vitals:   10/09/19 0642  TempSrc: Temporal  PainSc:          Complications: No apparent anesthesia complications

## 2019-10-09 NOTE — Progress Notes (Signed)
  Echocardiogram Echocardiogram Transesophageal has been performed.  Darlina Sicilian M 10/09/2019, 10:49 AM

## 2019-10-09 NOTE — Anesthesia Procedure Notes (Signed)
Arterial Line Insertion Start/End5/27/2021 7:24 AM, 10/09/2019 7:34 AM Performed by: Duane Boston, MD, anesthesiologist  Preanesthetic checklist: patient identified, IV checked, site marked, risks and benefits discussed, surgical consent, monitors and equipment checked, pre-op evaluation and timeout performed Lidocaine 1% used for infiltration Left, brachial was placed Catheter size: 20 G Hand hygiene performed  and Seldinger technique used  Attempts: 2 Procedure performed using ultrasound guided technique. Ultrasound Notes:anatomy identified, needle tip was noted to be adjacent to the nerve/plexus identified, no ultrasound evidence of intravascular and/or intraneural injection and image(s) printed for medical record Following insertion, dressing applied and Biopatch. Post procedure assessment: unchanged  Patient tolerated the procedure well with no immediate complications.

## 2019-10-09 NOTE — Anesthesia Postprocedure Evaluation (Signed)
Anesthesia Post Note  Patient: Adrian Olson  Procedure(s) Performed: MITRAL VALVE REPAIR (N/A )     Patient location during evaluation: PACU Anesthesia Type: General Level of consciousness: sedated Pain management: pain level controlled Vital Signs Assessment: post-procedure vital signs reviewed and stable Respiratory status: spontaneous breathing and respiratory function stable Cardiovascular status: stable Postop Assessment: no apparent nausea or vomiting Anesthetic complications: no    Last Vitals:  Vitals:   10/09/19 1130 10/09/19 1135  BP: (!) 123/44 (!) 121/46  Pulse: (!) 54 (!) 56  Resp: 16 10  Temp: (!) 36.4 C   SpO2: 98% 98%    Last Pain:  Vitals:   10/09/19 1130  TempSrc: Temporal  PainSc:                  Mykel Sponaugle DANIEL

## 2019-10-09 NOTE — Anesthesia Procedure Notes (Signed)
Procedure Name: Intubation Date/Time: 10/09/2019 7:51 AM Performed by: Renato Shin, CRNA Pre-anesthesia Checklist: Patient identified, Emergency Drugs available, Suction available and Patient being monitored Patient Re-evaluated:Patient Re-evaluated prior to induction Oxygen Delivery Method: Circle system utilized Preoxygenation: Pre-oxygenation with 100% oxygen Induction Type: IV induction Ventilation: Mask ventilation without difficulty Laryngoscope Size: Miller and 2 Grade View: Grade II Tube type: Oral Tube size: 7.5 mm Number of attempts: 1 Airway Equipment and Method: Stylet and Oral airway Placement Confirmation: ETT inserted through vocal cords under direct vision,  positive ETCO2 and breath sounds checked- equal and bilateral Secured at: 22 cm Tube secured with: Tape Dental Injury: Teeth and Oropharynx as per pre-operative assessment

## 2019-10-09 NOTE — Plan of Care (Signed)
  Problem: Education: Goal: Knowledge of General Education information will improve Description: Including pain rating scale, medication(s)/side effects and non-pharmacologic comfort measures Outcome: Progressing   Problem: Clinical Measurements: Goal: Will remain free from infection Outcome: Progressing Goal: Diagnostic test results will improve Outcome: Progressing Goal: Cardiovascular complication will be avoided Outcome: Progressing   Problem: Nutrition: Goal: Adequate nutrition will be maintained Outcome: Progressing

## 2019-10-09 NOTE — Interval H&P Note (Signed)
History and Physical Interval Note:  10/09/2019 7:34 AM  Adrian Olson  has presented today for surgery, with the diagnosis of Severe Mitral Insufficiency.  The various methods of treatment have been discussed with the patient and family. After consideration of risks, benefits and other options for treatment, the patient has consented to  Procedure(s): MITRAL VALVE REPAIR (N/A) as a surgical intervention.  The patient's history has been reviewed, patient examined, no change in status, stable for surgery.  I have reviewed the patient's chart and labs.  Questions were answered to the patient's satisfaction.     Sherren Mocha

## 2019-10-09 NOTE — Discharge Instructions (Signed)
Home Care Following Your MitraClip Procedure      If you have any questions or concerns you can call the structural heart office at [phone] during normal business hours 8am-4pm. If you have an urgent need after hours or on the weekend, please call [phone] to talk to the on call provider for general cardiology. If you have an emergency that requires immediate attention, please call 911.   Groin Site Care Refer to this sheet in the next few weeks. These instructions provide you with information on caring for yourself after your procedure. Your caregiver may also give you more specific instructions. Your treatment has been planned according to current medical practices, but problems sometimes occur. Call your caregiver if you have any problems or questions after your procedure. HOME CARE INSTRUCTIONS  You may shower 24 hours after the procedure. Remove the bandage (dressing) and gently wash the site with plain soap and water. Gently pat the site dry.   Do not apply powder or lotion to the site.   Do not sit in a bathtub, swimming pool, or whirlpool for 5 to 7 days.   No bending, squatting, or lifting anything over 10 pounds (4.5 kg) as directed by your caregiver.   Inspect the site at least twice daily.   Do not drive home if you are discharged the same day of the procedure. Have someone else drive you.   You may drive 72 hours after the procedure unless otherwise instructed by your caregiver.  What to expect:  Any bruising will usually fade within 1 to 2 weeks.   Blood that collects in the tissue (hematoma) may be painful to the touch. It should usually decrease in size and tenderness within 1 to 2 weeks.  SEEK IMMEDIATE MEDICAL CARE IF:  You have unusual pain at the groin site or down the affected leg.   You have redness, warmth, swelling, or pain at the groin site.   You have drainage (other than a small amount of blood on the dressing).   You have chills.   You have a  fever or persistent symptoms for more than 72 hours.   You have a fever and your symptoms suddenly get worse.   Your leg becomes pale, cool, tingly, or numb.   You have bleeding from the site. Hold pressure on the site until it subsides.    After MitraClip Checklist  Check  Test Description   Follow up appointment in 1-2 weeks  Most of our patients will see our structural heart physician assistant, Katie Xiamara Hulet, or your primary cardiologist within 1-2 weeks. Your incision site will be checked and you will be cleared to resume all normal activities if you are doing well.     1 month echo and follow up  You will have an echo to check on your heart valve clip and be seen back in the office by Katie Ozias Dicenzo PA-C.   Follow up with your primary cardiologist You will need to be seen by your primary cardiologist in the following 3-6 months after your 1 month appointment in the valve clinic. Often times your Plavix or Aspirin will be discontinued during this time, but this is decided on a case by case basis.    1 year echo and follow up You will have another echo to check on your heart valve after one year and be seen back in the office by Katie Jeoffrey Eleazer. This your last structural heart visit.   Bacterial endocarditis prophylaxis  You will   have to take antibiotics for the rest of your life before all dental procedures (even dental cleanings) to protect your heart valve from potential infection. Antibiotics are also required before some surgeries. Please check with your cardiologist before scheduling any surgeries. Also, please make sure to tell us if you have a penicillin allergy as you will require an alternative antibiotic.    ______________  Your Implant Identification Card Following your procedure, you will receive an Implant Identification Card, which your doctor will fill out and which you must carry with you at all times. Show your Implant Identification Card if you report to an emergency room.  This card identifies you as a patient who has had a MitraClip device implanted. If you require a magnetic resonance imaging (MRI) scan, tell your doctor or MRI technician that you have a MitraClip device implanted. Test results indicate that patients with the MitraClip device can safely undergo MRI scans under certain conditions described on the card.  

## 2019-10-10 ENCOUNTER — Other Ambulatory Visit: Payer: Self-pay | Admitting: Physician Assistant

## 2019-10-10 ENCOUNTER — Inpatient Hospital Stay (HOSPITAL_COMMUNITY): Payer: Medicare HMO

## 2019-10-10 DIAGNOSIS — I5033 Acute on chronic diastolic (congestive) heart failure: Secondary | ICD-10-CM

## 2019-10-10 DIAGNOSIS — Z9889 Other specified postprocedural states: Secondary | ICD-10-CM

## 2019-10-10 DIAGNOSIS — Z95818 Presence of other cardiac implants and grafts: Secondary | ICD-10-CM

## 2019-10-10 DIAGNOSIS — I5032 Chronic diastolic (congestive) heart failure: Secondary | ICD-10-CM

## 2019-10-10 DIAGNOSIS — Z954 Presence of other heart-valve replacement: Secondary | ICD-10-CM

## 2019-10-10 LAB — BASIC METABOLIC PANEL
Anion gap: 9 (ref 5–15)
BUN: 19 mg/dL (ref 8–23)
CO2: 23 mmol/L (ref 22–32)
Calcium: 8.8 mg/dL — ABNORMAL LOW (ref 8.9–10.3)
Chloride: 102 mmol/L (ref 98–111)
Creatinine, Ser: 1.09 mg/dL (ref 0.61–1.24)
GFR calc Af Amer: >60 mL/min (ref >60)
GFR calc non Af Amer: >60 mL/min (ref >60)
Glucose, Bld: 174 mg/dL — ABNORMAL HIGH (ref 70–99)
Potassium: 3.8 mmol/L (ref 3.5–5.1)
Sodium: 134 mmol/L — ABNORMAL LOW (ref 135–145)

## 2019-10-10 LAB — CBC
HCT: 31.8 % — ABNORMAL LOW (ref 39.0–52.0)
Hemoglobin: 10.8 g/dL — ABNORMAL LOW (ref 13.0–17.0)
MCH: 32.3 pg (ref 26.0–34.0)
MCHC: 34 g/dL (ref 30.0–36.0)
MCV: 95.2 fL (ref 80.0–100.0)
Platelets: 143 10*3/uL — ABNORMAL LOW (ref 150–400)
RBC: 3.34 MIL/uL — ABNORMAL LOW (ref 4.22–5.81)
RDW: 12.4 % (ref 11.5–15.5)
WBC: 12.7 10*3/uL — ABNORMAL HIGH (ref 4.0–10.5)
nRBC: 0 % (ref 0.0–0.2)

## 2019-10-10 MED ORDER — CLOPIDOGREL BISULFATE 75 MG PO TABS: 75.0000 mg | ORAL_TABLET | Freq: Every day | ORAL | 0 refills | Status: DC

## 2019-10-10 NOTE — TOC Progression Note (Signed)
Transition of Care [facility]) - Progression Note    Patient Details  Name: Adrian Olson MRN: [id number] Date of Birth: 23-Jan-1931  Transition of Care [facility]) CM/SW Contact  Zenon Mayo, RN Phone Number: 10/10/2019, 10:18 AM  Clinical Narrative:    Patient will be on plavix which clopidogrel is the generic which is affordable around 30.00 for 30 day supply.        Expected Discharge Plan and Services                                                 Social Determinants of Health (SDOH) Interventions    Readmission Risk Interventions No flowsheet data found.

## 2019-10-10 NOTE — TOC Benefit Eligibility Note (Signed)
Transition of Care [facility]) Benefit Eligibility Note    Patient Details  Name: Adrian Olson MRN: [id number] Date of Birth: 02-Jul-1930   Medication/Dose: CLPOIDOGREL  24 MG DAILY  ( PLAVIX )  Covered?: Yes  Tier: (TIER- 1 DRUG)  Prescription Coverage Preferred Pharmacy: [facility] [place] with Person/Company/Phone Number:: SHERRE  @ [employer] [id number] # [phone]  Co-Pay: ZERO DOLLARS FOR 30 DAY SUPPLY  Prior Approval: No  Deductible: Unmet(OUT-OF-POCKET- UNMET)  Additional Notes: PRESCRIPTION WAITING AT [facility] FOR 90 DAY SUPPLY    CO-PAY-ZERO Keitha [place] Phone Number: 10/10/2019, 11:20 AM

## 2019-10-10 NOTE — Progress Notes (Signed)
Discharge instruction review with Pt and family. Pt to be discharge with family

## 2019-10-10 NOTE — Progress Notes (Signed)
  Echocardiogram 2D Echocardiogram has been performed.  Darlina Sicilian M 10/10/2019, 1:21 PM

## 2019-10-10 NOTE — Discharge Summary (Addendum)
[facility] VALVE TEAM  Discharge Summary    Patient ID: Adrian Olson MRN: [id number]; DOB: 23-May-1930  Admit date: 10/09/2019 Discharge date: 10/10/2019  Primary Care Provider: [name], MD  Primary Cardiologist: [name], MD   Discharge Diagnoses    Principal Problem:   S/P mitral valve clip implantation Active Problems:   Nonrheumatic mitral (valve) insufficiency   Bilateral carotid artery disease [facility])   Essential hypertension   Hyperlipidemia   Erectile dysfunction   Acute on chronic diastolic heart failure ([place])   Allergies No Known Allergies  Diagnostic Studies/Procedures    10/09/2019 MITRAL VALVE REPAIR  Conclusion  Successful transcatheter edge to edge mitral valve repair using 2 MitraClip XTW devices, first device positioned A2 P2, second device positioned A1 P1. Reduction in mitral regurgitation from 4+ to 1+.  Recommendations  Antiplatelet/Anticoag Recommend uninterrupted dual antiplatelet therapy with Aspirin 81mg  daily and Clopidogrel 75mg  daily for 3 months.    _____________   Echo 10/10/19: complete but pending formal read at the time of discharge   History of Present Illness     Adrian Olson is a 86 y.o. male with a history of hypertension, bilateral carotid artery disease, hyperlipidemia, diabetes, anxiety/depression and mitral valve prolapse and mitral regurgitation who presented to [facility] on 10/09/19 for planned transcatheter edge to edge repair.   Patient has a longstanding history of systolic murmur and mitral valve prolapse for which she has been followed by Dr. Tamala Julian for many years.  He has remained physically active and functionally independent.  He has recently admitted decreased exercise capacity with increased exertional shortness of breath and generalized fatigue.  He has also been suffering from increasing amounts of anxiety and depression over the past year.  He was seen in follow-up by  Dr. Tamala Julian and transthoracic echocardiogram performed mitral valve prolapse with severe mitral regurgitation and normal left ventricular systolic function but slightly enlarged left ventricular chamber size. He subsequently underwent TEE and diagnostic cardiac catheterization on Sep 16, 2019. TEE confirmed the presence of mitral valve prolapse with a large flail segment involving a portion of the posterior leaflet causing severe mitral regurgitation.  There was severe left atrial enlargement and no left atrial thrombus.  There is moderate tricuspid regurgitation. Left ventricular systolic function was felt to be normal with ejection fraction reported 55 to 60%. Diagnostic cardiac catheterization revealed mild nonobstructive coronary artery disease with exception of an eccentric 60% narrowing of the mid left anterior descending coronary artery.  Right heart pressures were normal although there were large V waves on wedge tracing consistent with severe mitral regurgitation.   The patient has been evaluated by the multidisciplinary valve team and felt to have severe, symptomatic mitral regurgitation and to be a suitable candidate for TEER, which was set up for 10/09/19.   Hospital Course     Consultants: none  Mitral valve prolapse with severe MR: s/p transcatheter edge to edge repair using 2 MitraClip XTW devices, first device positioned A2 P2, second device positioned A1 P1. Post op echo completed but pending formal read at the time of discharge. He has been continued on a baby aspirin with the addition of Plavix x 3 months. I will see him next week for close follow up in the office.   Acute on chronic diastolic CHF: as evidenced by an elevated BNP on pre admission lab work. This has been treated with TEER. He has been resumed on his home HCTZ.  HTN: BP well controlled currently.   _____________  Discharge Vitals Blood pressure (!) 101/53, pulse 62, temperature 98.5 F (36.9 C), temperature source  Oral, resp. rate 20, height 5\' 9"  (1.753 m), weight 64.4 kg, SpO2 99 %.  Filed Weights   10/09/19 0642  Weight: 64.4 kg    GEN: Well nourished, well developed, in no acute distress HEENT: normal Neck: no JVD or masses Cardiac: RRR; 3/6 holosystolic murmur at apex. No rubs, or gallops,no edema  Respiratory:  clear to auscultation bilaterally, normal work of breathing GI: soft, nontender, nondistended, + BS MS: no deformity or atrophy Skin: warm and dry, no rash.  Groin site clear without hematoma or ecchymosis  Neuro:  Alert and Oriented x 3, Strength and sensation are intact Psych: euthymic mood, full affect    Labs & Radiologic Studies    CBC Recent Labs    10/10/19 0203  WBC 12.7*  HGB 10.8*  HCT 31.8*  MCV 95.2  PLT [id number]*   Basic Metabolic Panel Recent Labs    10/10/19 0203  NA 134*  K 3.8  CL 102  CO2 23  GLUCOSE 174*  BUN 19  CREATININE 1.09  CALCIUM 8.8*   Liver Function Tests No results for input(s): AST, ALT, ALKPHOS, BILITOT, PROT, ALBUMIN in the last 72 hours. No results for input(s): LIPASE, AMYLASE in the last 72 hours. Cardiac Enzymes No results for input(s): CKTOTAL, CKMB, CKMBINDEX, TROPONINI in the last 72 hours. BNP Invalid input(s): POCBNP D-Dimer No results for input(s): DDIMER in the last 72 hours. Hemoglobin A1C No results for input(s): HGBA1C in the last 72 hours. Fasting Lipid Panel No results for input(s): CHOL, HDL, LDLCALC, TRIG, CHOLHDL, LDLDIRECT in the last 72 hours. Thyroid Function Tests No results for input(s): TSH, T4TOTAL, T3FREE, THYROIDAB in the last 72 hours.  Invalid input(s): FREET3 _____________  DG Chest 2 View  Result Date: 10/07/2019 CLINICAL DATA:  Preop for aortic valve repair. EXAM: CHEST - 2 VIEW COMPARISON:  January 15, 2004. FINDINGS: The heart size and mediastinal contours are within normal limits. Both lungs are clear. The visualized skeletal structures are unremarkable. IMPRESSION: No active  cardiopulmonary disease. Electronically Signed   By: Marijo Conception M.D.   On: 10/07/2019 15:47   CARDIAC CATHETERIZATION  Result Date: 10/09/2019 Successful transcatheter edge to edge mitral valve repair using 2 MitraClip XTW devices, first device positioned A2 P2, second device positioned A1 P1. Reduction in mitral regurgitation from 4+ to 1+.  CARDIAC CATHETERIZATION  Result Date: 09/16/2019  Documented severe mitral regurgitation due to flail of the P2 segment.  Large left atrium.  Mean pulmonary wedge pressure 14 mmHg with V wave spiking to 36 mmHg.  Normal left ventricular systolic function.  MR not graded because LV gram was performed by simple hand injection.  Normal left main  Large left anterior descending that wraps around the left ventricular apex.  Proximal vessel contains eccentric 40% narrowing from ostium to proximal segment.  The mid segment after the dominant diagonal contains eccentric 60 to 70% narrowing.  LAD diagonal forms a [place] 010 stenosis configuration.  Normal circumflex.  Circumflex is relatively small.  Large ramus intermedius gives origin to the large first septal perforator.  Dominant, RCA with minimal proximal luminal irregularities. RECOMMENDATIONS:  Moderate LAD coronary disease which can be treated medically until or if he develops anginal symptoms.  He is being referred to the valve clinic/Dr. Roxy Manns for consideration of mitral valve surgery/MitraClip.  ECHO TEE  Result Date:  10/09/2019    TRANSESOPHOGEAL ECHO REPORT   Patient Name:   Adrian Olson [facility] Date of Exam: 10/09/2019 Medical Rec #:  [id number]      Height:       69.0 in Accession #:    [id number]     Weight:       142.0 lb Date of Birth:  Jan 04, 1931     BSA:          1.786 m Patient Age:    40 years       BP:           178/51 mmHg Patient Gender: M              HR:           71 bpm. Exam Location:  Inpatient Procedure: Transesophageal Echo, Cardiac Doppler, Color Doppler and 3D Echo Indications:      Severe mitral regurgitation [id number]  History:         Patient has prior history of Echocardiogram examinations, most                  recent 09/16/2019. Mitral Valve Prolapse; Risk                  Factors:Dyslipidemia, Hypertension and Diabetes. Bilateral                  carotid artery disease.                   Mitral Valve: Two XTW Mitra Clips placed valve is present in                  the mitral position. Procedure Date: 10/09/2019.  Sonographer:     Darlina Sicilian RDCS Referring Phys:  [place] Diagnosing Phys: Ena Dawley MD PROCEDURE: The transesophogeal probe was passed without difficulty through the esophogus of the patient. Sedation performed by different physician. The patient's vital signs; including heart rate, blood pressure, and oxygen saturation; remained stable throughout the procedure. The patient developed no complications during the procedure. Periprocedural TEE during a MitraClip procedure. General anesthesia performed by anesthesiology staff. IMPRESSIONS  1. MitraClip procedure for severe mitral regurgitation (primary mitral regurgitation with Barlow's valve with severe prolapse of P2 and partially P1, MR 4+ with anteriorly directed jet, PISA radius 1.7 cm, regurgitant volume 259 ml, flail gap 8 mm, reversal of forward flow in bilateral pulmonary veins). Two XTW MitraClips were placed. The first one in A2-P2 position reducing MR to 3+. additional clip was placed lateral to the first clip in A1-P1 location with further MR reduction to 1+. Mean transmitral gradient increased from 2 to 4 mmHg. Reversal of foward pulmonary vein flow has resolved.  2. Left ventricular ejection fraction, by estimation, is 60 to 65%. The left ventricle has normal function. The left ventricle has no regional wall motion abnormalities. Left ventricular diastolic function could not be evaluated.  3. Right ventricular systolic function is normal. The right ventricular size is normal.  4. Left atrial size  was severely dilated. No left atrial/left atrial appendage thrombus was detected.  5. Right atrial size was moderately dilated.  6. The mitral valve is myxomatous. Severe mitral valve regurgitation. No evidence of mitral stenosis. The mean mitral valve gradient is 2.0 mmHg. There is a Two XTW Mitra Clips placed present in the mitral position. Procedure Date: 10/09/2019.  7. Tricuspid valve regurgitation is moderate.  8. The aortic valve is normal in structure. Aortic valve  regurgitation is mild. No aortic stenosis is present.  9. The inferior vena cava is normal in size with greater than 50% respiratory variability, suggesting right atrial pressure of 3 mmHg. Conclusion(s)/Recommendation(s): Normal biventricular function without evidence of hemodynamically significant valvular heart disease. FINDINGS  Left Ventricle: Left ventricular ejection fraction, by estimation, is 60 to 65%. The left ventricle has normal function. The left ventricle has no regional wall motion abnormalities. The left ventricular internal cavity size was normal in size. There is  no left ventricular hypertrophy. Left ventricular diastolic function could not be evaluated. Right Ventricle: The right ventricular size is normal. No increase in right ventricular wall thickness. Right ventricular systolic function is normal. Left Atrium: Left atrial size was severely dilated. No left atrial/left atrial appendage thrombus was detected. Right Atrium: Right atrial size was moderately dilated. Pericardium: There is no evidence of pericardial effusion. Mitral Valve: The mitral valve is myxomatous. There is severe holosystolic prolapse of the medial scallop of the posterior leaflet of the mitral valve. There is severe thickening of the posterior mitral valve leaflet(s). Normal mobility of the mitral valve leaflets. Severe mitral valve regurgitation, with anteriorly-directed jet. There is a Two XTW Mitra Clips placed present in the mitral position.  Procedure Date: 10/09/2019. No evidence of mitral valve stenosis. The mean mitral valve gradient is 2.0  mmHg. Tricuspid Valve: The tricuspid valve is normal in structure. Tricuspid valve regurgitation is moderate . No evidence of tricuspid stenosis. Aortic Valve: The aortic valve is normal in structure. Aortic valve regurgitation is mild. No aortic stenosis is present. Pulmonic Valve: The pulmonic valve was normal in structure. Pulmonic valve regurgitation is trivial. No evidence of pulmonic stenosis. Aorta: The aortic root is normal in size and structure. Venous: The inferior vena cava is normal in size with greater than 50% respiratory variability, suggesting right atrial pressure of 3 mmHg. IAS/Shunts: No atrial level shunt detected by color flow Doppler.  MITRAL VALVE MV Mean grad: 2.0 mmHg MR Peak grad:    114.5 mmHg MR Mean grad:    73.0 mmHg MR Vmax:         535.00 cm/s MR Vmean:        406.0 cm/s MR PISA:         18.16 cm MR PISA Eff ROA: 131 mm MR PISA Radius:  1.70 cm Ena Dawley MD Electronically signed by Ena Dawley MD Signature Date/Time: 10/09/2019/3:43:56 PM    Final    ECHO TEE  Result Date: 09/16/2019    TRANSESOPHOGEAL ECHO REPORT   Patient Name:   Adrian Olson [facility] Date of Exam: 09/16/2019 Medical Rec #:  [id number]      Height:       69.0 in Accession #:    [id number]     Weight:       145.1 lb Date of Birth:  Sep 28, 1930     BSA:          1.803 m Patient Age:    34 years       BP:           178/62 mmHg Patient Gender: M              HR:           77 bpm. Exam Location:  Inpatient Procedure: 3D Echo, Transesophageal Echo, Cardiac Doppler and Color Doppler Indications:     Mitral regurgitation  History:         Patient has prior history of Echocardiogram examinations, most  recent 06/17/2019. Mitral Valve Disease, MR and Mitral Valve                  Prolapse, Signs/Symptoms:Dyspnea; Risk Factors:Hypertension and                  Dyslipidemia.  Sonographer:     Dustin Flock Referring Phys:  [place] Phys: Kirk Ruths MD PROCEDURE: The transesophogeal probe was passed without difficulty through the esophogus of the patient. Sedation performed by performing physician. The patient was monitored while under deep sedation. Anesthestetic sedation was provided intravenously by  Anesthesiology: 238mg  of Propofol. The patient developed no complications during the procedure. IMPRESSIONS  1. Normal LV function; trace AI; flail middle scallop of posterior MV leaflet with severe, eccentric MR; severe LAE; no LAA thrombus; moderate TR.  2. Left ventricular ejection fraction, by estimation, is 55 to 60%. The left ventricle has normal function. The left ventricle has no regional wall motion abnormalities.  3. Right ventricular systolic function is normal. The right ventricular size is normal.  4. Left atrial size was severely dilated. No left atrial/left atrial appendage thrombus was detected.  5. The mitral valve is abnormal. Severe mitral valve regurgitation.  6. Tricuspid valve regurgitation is moderate.  7. The aortic valve is tricuspid. Aortic valve regurgitation is trivial. Mild aortic valve sclerosis is present, with no evidence of aortic valve stenosis.  8. There is mild (Grade II) plaque involving the descending aorta. FINDINGS  Left Ventricle: Left ventricular ejection fraction, by estimation, is 55 to 60%. The left ventricle has normal function. The left ventricle has no regional wall motion abnormalities. The left ventricular internal cavity size was normal in size. There is  no left ventricular hypertrophy. Right Ventricle: The right ventricular size is normal. Right vetricular wall thickness was not assessed. Right ventricular systolic function is normal. Left Atrium: Left atrial size was severely dilated. No left atrial/left atrial appendage thrombus was detected. Right Atrium: Right atrial size was normal in size. Pericardium: There is no evidence of  pericardial effusion. Mitral Valve: The mitral valve is abnormal. There is severe prolapse of the middle scallop of the posterior leaflet of the mitral valve. Severe mitral valve regurgitation. Tricuspid Valve: The tricuspid valve is normal in structure. Tricuspid valve regurgitation is moderate. Aortic Valve: The aortic valve is tricuspid. Aortic valve regurgitation is trivial. Mild aortic valve sclerosis is present, with no evidence of aortic valve stenosis. Pulmonic Valve: The pulmonic valve was normal in structure. Pulmonic valve regurgitation is mild. Aorta: The aortic root is normal in size and structure. There is mild (Grade II) plaque involving the descending aorta. IAS/Shunts: No atrial level shunt detected by color flow Doppler. Additional Comments: Normal LV function; trace AI; flail middle scallop of posterior MV leaflet with severe, eccentric MR; severe LAE; no LAA thrombus; moderate TR. Kirk Ruths MD Electronically signed by Kirk Ruths MD Signature Date/Time: 09/16/2019/11:45:06 AM    Final    Disposition   Pt is being discharged home today in good condition.  Follow-up Plans & Appointments    Follow-up Information    Eileen Stanford, PA-C. Go on 10/15/2019.   Specialties: Cardiology, Radiology Why: @ 2:30pm, please arrive at least 10 minutes early Contact information: [address] [place] [id number] [phone]            Discharge Medications   Allergies as of 10/10/2019   No Known Allergies     Medication List  STOP taking these medications   ibuprofen 200 MG tablet Commonly known as: ADVIL     TAKE these medications   acetaminophen 500 MG tablet Commonly known as: TYLENOL Take 1,000 mg by mouth every 6 (six) hours as needed (for pain.).   aspirin EC 81 MG tablet Take 81 mg by mouth at bedtime.   CINNAMON PO Take 15 mLs by mouth daily.   clopidogrel 75 MG tablet Commonly known as: PLAVIX Take 1 tablet (75 mg total) by  mouth daily.   finasteride 5 MG tablet Commonly known as: PROSCAR Take 5 mg by mouth daily at 12 noon.   hydrochlorothiazide 12.5 MG capsule Commonly known as: MICROZIDE Take 12.5 mg by mouth daily.   levothyroxine 75 MCG tablet Commonly known as: SYNTHROID Take 75 mcg by mouth daily before breakfast.   metFORMIN 500 MG tablet Commonly known as: GLUCOPHAGE Take 500 mg by mouth every evening.   pravastatin 20 MG tablet Commonly known as: PRAVACHOL Take 20 mg by mouth at bedtime.   psyllium 58.6 % packet Commonly known as: METAMUCIL Take 1 packet by mouth daily.   ramipril 5 MG capsule Commonly known as: ALTACE Take 5 mg by mouth daily.   sodium chloride 0.65 % Soln nasal spray Commonly known as: OCEAN Place 1 spray into both nostrils daily as needed for congestion.   tamsulosin 0.4 MG Caps capsule Commonly known as: FLOMAX Take 0.4 mg by mouth daily at 12 noon.   Vitamin D 50 MCG (2000 UT) tablet Take 2,000 Units by mouth daily at 12 noon.         Outstanding Labs/Studies   none  Duration of Discharge Encounter   Greater than 30 minutes including physician time.  SignedAngelena Form, PA-C 10/10/2019, 1:33 PM [phone]  Patient seen, examined. Available data reviewed. Agree with findings, assessment, and plan as outlined by Nell Range, PA-C.  The patient is independently interviewed and examined.  He is alert, oriented, in no distress.  Right groin site is clear.  The dressing is removed and there is no evidence of hematoma or ecchymosis.  The heart is regular rate and rhythm with a grade 2/6 holosystolic murmur at the apex.  Lung fields are clear.  Echo is reviewed and shows 1-2+ residual MR, no change in LV function.  Overall he has had a very good result from placement of 2 MitraClip XTW devices.  He has tolerated the procedure well and understands to take aspirin and clopidogrel for 3 months without interruption.  Sherren Mocha,  M.D. 10/10/2019 1:37 PM

## 2019-10-10 NOTE — Progress Notes (Signed)
CARDIAC REHAB PHASE I   PRE:  Rate/Rhythm: 64 SR    BP: sitting 125/52    SaO2: 99 RA  MODE:  Ambulation: 460 ft   POST:  Rate/Rhythm: 92 SR with PAC/PVCs    BP: sitting 134/62     SaO2: 100 RA  Pts legs slightly weak, especially initially. Stronger with distance. He sts that he has struggled with his balance recently, esp after getting up and around the house. Sts he does well with his normal walk. No SOB or fatigue today. Discussed walking at home, restrictions, and CRPII. Pt declined CRPII.  Suggest Outpatient PT eval for balance, discussed this with pt.  [place], ACSM 10/10/2019 9:40 AM

## 2019-10-10 NOTE — Plan of Care (Signed)

## 2019-10-11 NOTE — Progress Notes (Signed)
[facility] [place]                                       Cardiology Office Note    Date:  10/15/2019   ID:  Adrian Olson, DOB 1930-09-21, MRN [id number]  PCP:  [name], MD  Cardiologist:  Dr. Tamala Julian   CC: TOC s/p MitraClip  History of Present Illness:  Adrian Olson is a 86 y.o. male with a history of hypertension, bilateral carotid artery disease, hyperlipidemia, diabetes, anxiety/depression and mitral valve prolapse and mitral regurgitation s/p TEER (10/09/19) who presents to clinic for follow up.   Patient has a longstanding history of systolic murmur and mitral valve prolapse for which she has been followed by Dr. Tamala Julian for many years. He has remained physically active and functionally independent. He has recently admitted decreased exercise capacity with increased exertional shortness of breath and generalized fatigue. He has also been suffering from increasing amounts of anxiety and depression over the past year. He was seen in follow-up by Dr. Tamala Julian and transthoracic echocardiogram performed mitral valve prolapse with severe mitral regurgitation and normal left ventricular systolic function but slightly enlarged left ventricular chamber size. He subsequently underwent TEE and diagnostic cardiac catheterization on Sep 16, 2019. TEE confirmed the presence of mitral valve prolapse with a large flail segment involving a portion of the posterior leaflet causing severe mitral regurgitation. There was severe left atrial enlargement and no left atrial thrombus. There is moderate tricuspid regurgitation. Left ventricular systolic function was felt to be normal with ejection fraction reported 55 to 60%. Diagnostic cardiac catheterization revealed mild nonobstructive coronary artery disease with exception of an eccentric 60% narrowing of the mid left anterior descending coronary artery. Right heart pressures were normal although there were large V  waves on wedge tracing consistent with severe mitral regurgitation.   He was evaluated by the multidisciplinary and underwent successful transcatheter edge to edge repair using 2 MitraClip XTW devices, first device positioned A2 P2, second device positioned A1 P1. Post op echo showed EF 60-65%, mod pulm HTN, stable mitral valve repair with mild to moderate residual MR, mean gradient 5.5 mmHg. He was discharged on POD1 on aspirin and plavix.   Today he presents to clinic for follow up. No CP or SOB. No LE edema, orthopnea or PND. No dizziness or syncope. No blood in stool or urine. He has had some short episodes of palpitations when he lies down at night. He feels jittery all the time. This had preceded valve surgery but now has gotten worse. He has been taking his wife's ativan with good result, but would like to try a SSRI. He would like to try Prozac as this has helped her.     Past Medical History:  Diagnosis Date   Bilateral carotid artery disease (HCC)    Severe Left and moderate Right   Diabetes mellitus without complication [facility])    ED (erectile dysfunction)    Hyperlipidemia    Hypertension    Lumbar disc disease    Mitral valve prolapse    moderate to severe MR, LVEF 55% Echo 2010   Moderate mitral regurgitation 03/16/2014   S/P mitral valve clip implantation 10/09/2019   s/p TEER using 2 MitraClip XTW devices, first device positioned A2 P2, second device positioned A1 P1 by Dr. Burt Knack    Past Surgical History:  Procedure Laterality Date   CARDIAC CATHETERIZATION     MITRAL VALVE REPAIR  10/09/2019   MITRAL VALVE REPAIR (N/A )   MITRAL VALVE REPAIR N/A 10/09/2019   Procedure: MITRAL VALVE REPAIR;  Surgeon: Sherren Mocha, MD;  Location: [facility];  Service: Cardiovascular;  Laterality: N/A;   RIGHT/LEFT HEART CATH AND CORONARY ANGIOGRAPHY N/A 09/16/2019   Procedure: RIGHT/LEFT HEART CATH AND CORONARY ANGIOGRAPHY;  Surgeon: Belva Crome, MD;  Location: [facility];  Service: Cardiovascular;  Laterality: N/A;   TEE WITHOUT CARDIOVERSION N/A 09/16/2019   Procedure: TRANSESOPHAGEAL ECHOCARDIOGRAM (TEE);  Surgeon: Lelon Perla, MD;  Location: [facility];  Service: Cardiovascular;  Laterality: N/A;    Current Medications: Outpatient Medications Prior to Visit  Medication Sig Dispense Refill   acetaminophen (TYLENOL) 500 MG tablet Take 1,000 mg by mouth every 6 (six) hours as needed (for pain.).     aspirin EC 81 MG tablet Take 81 mg by mouth at bedtime.     Cholecalciferol (VITAMIN D) 2000 UNITS tablet Take 2,000 Units by mouth daily at 12 noon.      CINNAMON PO Take 15 mLs by mouth daily.     clopidogrel (PLAVIX) 75 MG tablet Take 1 tablet (75 mg total) by mouth daily. 90 tablet 0   finasteride (PROSCAR) 5 MG tablet Take 5 mg by mouth daily at 12 noon.      hydrochlorothiazide (MICROZIDE) 12.5 MG capsule Take 12.5 mg by mouth daily.     levothyroxine (SYNTHROID, LEVOTHROID) 75 MCG tablet Take 75 mcg by mouth daily before breakfast.     metFORMIN (GLUCOPHAGE) 500 MG tablet Take 500 mg by mouth every evening.      pravastatin (PRAVACHOL) 20 MG tablet Take 20 mg by mouth at bedtime.      psyllium (METAMUCIL) 58.6 % packet Take 1 packet by mouth daily.     ramipril (ALTACE) 5 MG capsule Take 5 mg by mouth daily.      sodium chloride (OCEAN) 0.65 % SOLN nasal spray Place 1 spray into both nostrils daily as needed for congestion.     tamsulosin (FLOMAX) 0.4 MG CAPS capsule Take 0.4 mg by mouth daily at 12 noon.      No facility-administered medications prior to visit.     Allergies:   Patient has no known allergies.   Social History   Socioeconomic History   Marital status: Married    Spouse name: Not on file   Number of children: Not on file   Years of education: Not on file   Highest education level: Not on file  Occupational History   Not on file  Tobacco Use   Smoking status: Never Smoker   Smokeless  tobacco: Never Used  Substance and Sexual Activity   Alcohol use: No    Alcohol/week: 0.0 standard drinks   Drug use: No   Sexual activity: Not on file  Other Topics Concern   Not on file  Social History Narrative   Not on file   Social Determinants of Health   Financial Resource Strain:    Difficulty of Paying Living Expenses:   Food Insecurity:    Worried About Charity fundraiser in the Last Year:    Arboriculturist in the Last Year:   Transportation Needs:    Film/video editor (Medical):    Lack of Transportation (Non-Medical):   Physical Activity:    Days of Exercise per Week:  Minutes of Exercise per Session:   Stress:    Feeling of Stress :   Social Connections:    Frequency of Communication with Friends and Family:    Frequency of Social Gatherings with Friends and Family:    Attends Religious Services:    Active Member of Clubs or Organizations:    Attends Music therapist:    Marital Status:      Family History:  The patient's family history includes Cancer in his mother; Dementia in his sister; Diabetes in his father and sister; Heart disease in his father and sister; Obesity in his sister; Other in his father and sister; Stroke in his father.     ROS:   Please see the history of present illness.    ROS All other systems reviewed and are negative.   PHYSICAL EXAM:   VS:  BP 110/70    Pulse 62    Ht 5\' 9"  (1.753 m)    Wt 140 lb (63.5 kg)    SpO2 99%    BMI 20.67 kg/m    GEN: Well nourished, well developed, in no acute distress HEENT: normal Neck: no JVD or masses Cardiac: RRR; 3/6 holosystolic murmur at apex. no rubs, or gallops,no edema  Respiratory:  clear to auscultation bilaterally, normal work of breathing GI: soft, nontender, nondistended, + BS MS: no deformity or atrophy Skin: warm and dry, no rash Neuro:  Alert and Oriented x 3, Strength and sensation are intact Psych: euthymic mood, full affect   Wt  Readings from Last 3 Encounters:  10/15/19 140 lb (63.5 kg)  10/09/19 142 lb (64.4 kg)  10/07/19 139 lb 12.8 oz (63.4 kg)      Studies/Labs Reviewed:   EKG:  EKG isordered today.  The ekg ordered today demonstrates sinus with HR 62bpm  Recent Labs: 10/07/2019: ALT 14; B Natriuretic Peptide 245.8 10/10/2019: BUN 19; Creatinine, Ser 1.09; Hemoglobin 10.8; Platelets 143; Potassium 3.8; Sodium 134   Lipid Panel No results found for: CHOL, TRIG, HDL, CHOLHDL, VLDL, LDLCALC, LDLDIRECT  Additional studies/ records that were reviewed today include:   10/09/19 MITRAL VALVE REPAIR  Conclusion Successful transcatheter edge to edge mitral valve repair using 2 MitraClip XTW devices, first device positioned A2 P2, second device positioned A1 P1. Reduction in mitral regurgitation from 4+ to 1+.  Recommendations  Antiplatelet/Anticoag Recommend uninterrupted dual antiplatelet therapy with Aspirin 81mg  daily and Clopidogrel 75mg  daily for 3 months.   __________________   Echo 10/10/19 IMPRESSIONS  1. Day 1 post two MitraClips placement. There is residual mild to  moderate mitral regurgitation. Mean transmitral gradient 5 mmHg.  2. Left ventricular ejection fraction, by estimation, is 60 to 65%. The  left ventricle has normal function. The left ventricle has no regional  wall motion abnormalities. Left ventricular diastolic parameters are  consistent with Grade II diastolic  dysfunction (pseudonormalization). Elevated left ventricular end-diastolic  pressure.  3. Right ventricular systolic function is normal. The right ventricular  size is normal. There is moderately elevated pulmonary artery systolic  pressure. The estimated right ventricular systolic pressure is [id number] mmHg.  4. Left atrial size was severely dilated.  5. Right atrial size was moderately dilated.  6. The mitral valve has been repaired/replaced. Mild to moderate mitral  valve regurgitation. Mild to moderate mitral  stenosis.  7. The aortic valve is normal in structure. Aortic valve regurgitation is  mild. Mild to moderate aortic valve sclerosis/calcification is present,  without any evidence of aortic stenosis.  8.  The inferior vena cava is dilated in size with <50% respiratory  variability, suggesting right atrial pressure of 15 mmHg.    ASSESSMENT & PLAN:   Mitral valve prolapse with severe MR s/p TEER: doing well. Groin site healing well. SBE prophylaxis discussed; I have RX'd amoxicillin.  Continue on aspirin and plavix. I will see him back at the end of this month for follow up and echo.   Chronic diastolic CHF: appears euvolemic. Continue on HCTZ 12.5 mg daily.   HTN: Bp well controlled. No changes made  Anxiety/depression: feels jittery all the time. This had preceded valve surgery but now has gotten worse. He has been taking his wife's ativan with good result, but would like to try a SSRI. He would like to try Prozac as this has helped her. I told him I do not specialize in this type of medication but he asked that I start it until he can get into see his PCP. I will start Prozac at the lowest dose and he will follow up with Dr. Maudie Mercury  Medication Adjustments/Labs and Tests Ordered: Current medicines are reviewed at length with the patient today.  Concerns regarding medicines are outlined above.  Medication changes, Labs and Tests ordered today are listed in the Patient Instructions below. Patient Instructions  Medication Instructions:  1) START PROZAC 10 mg daily 2) Your provider discussed the importance of taking an antibiotic prior to all dental visits to prevent damage to the heart valves from infection. You were given a prescription for AMOXIL 2,000 mg to take one hour prior to any dental appointment.  *If you need a refill on your cardiac medications before your next appointment, please call your pharmacy*   Follow-Up: Please keep your appointments as scheduled!     Signed, Angelena Form, PA-C  10/15/2019 3:09 PM    [facility] [place] [place], [place], [place]  [address] Phone: [phone]; Fax: [phone]

## 2019-10-13 ENCOUNTER — Telehealth: Payer: Self-pay | Admitting: Physician Assistant

## 2019-10-13 NOTE — Telephone Encounter (Signed)
  [place] VALVE TEAM   Patient contacted regarding discharge from [facility] on 10/10/19  Patient understands to follow up with provider Nell Range on 6/2 at [facility].  Patient understands discharge instructions? yes Patient understands medications and regimen? yes Patient understands to bring all medications to this visit? Yes   Overall, just not feeling great. Has felt a little lightheaded and has had his heart pounding at times. Hasn't take BP this AM, but it was high yesterday. His BG was also running high. Given the holiday today, I gave him my cell phone number in case he needs me.   Angelena Form PA-C  [facility]

## 2019-10-14 ENCOUNTER — Inpatient Hospital Stay (HOSPITAL_COMMUNITY): Admission: RE | Admit: 2019-10-14 | Payer: Medicare HMO | Source: Ambulatory Visit

## 2019-10-14 ENCOUNTER — Telehealth: Payer: Self-pay | Admitting: Physician Assistant

## 2019-10-14 LAB — ECHOCARDIOGRAM LIMITED
Height: 69 in
Weight: 2272 oz

## 2019-10-14 NOTE — Telephone Encounter (Signed)
Pt would like permission for his wife to come with him to his f/u appt with Angelena Form tomorrow. He is very unsteady on his feet and uses a walker. He would like his wife to be present to help him.  Please let him know what the office decides

## 2019-10-14 NOTE — Telephone Encounter (Signed)
Informed patient he may have a visitor accompany him to his visit. He was grateful for assistance.

## 2019-10-15 ENCOUNTER — Other Ambulatory Visit: Payer: Self-pay

## 2019-10-15 ENCOUNTER — Encounter: Payer: Self-pay | Admitting: Physician Assistant

## 2019-10-15 ENCOUNTER — Ambulatory Visit: Payer: Medicare HMO | Admitting: Physician Assistant

## 2019-10-15 VITALS — BP 110/70 | HR 62 | Ht 69.0 in | Wt 140.0 lb

## 2019-10-15 DIAGNOSIS — Z95818 Presence of other cardiac implants and grafts: Secondary | ICD-10-CM | POA: Diagnosis not present

## 2019-10-15 DIAGNOSIS — I10 Essential (primary) hypertension: Secondary | ICD-10-CM | POA: Diagnosis not present

## 2019-10-15 DIAGNOSIS — I5032 Chronic diastolic (congestive) heart failure: Secondary | ICD-10-CM

## 2019-10-15 DIAGNOSIS — R69 Illness, unspecified: Secondary | ICD-10-CM | POA: Diagnosis not present

## 2019-10-15 DIAGNOSIS — Z9889 Other specified postprocedural states: Secondary | ICD-10-CM | POA: Diagnosis not present

## 2019-10-15 DIAGNOSIS — F419 Anxiety disorder, unspecified: Secondary | ICD-10-CM | POA: Diagnosis not present

## 2019-10-15 MED ORDER — AMOXICILLIN 500 MG PO TABS
ORAL_TABLET | ORAL | 12 refills | Status: DC
Start: 2019-10-15 — End: 2021-05-03

## 2019-10-15 MED ORDER — FLUOXETINE HCL 10 MG PO CAPS: 10.0000 mg | ORAL_CAPSULE | Freq: Every day | ORAL | 2 refills | Status: DC

## 2019-10-15 NOTE — Patient Instructions (Signed)
Medication Instructions:  1) START PROZAC 10 mg daily 2) Your provider discussed the importance of taking an antibiotic prior to all dental visits to prevent damage to the heart valves from infection. You were given a prescription for AMOXIL 2,000 mg to take one hour prior to any dental appointment.  *If you need a refill on your cardiac medications before your next appointment, please call your pharmacy*   Follow-Up: Please keep your appointments as scheduled!

## 2019-10-17 ENCOUNTER — Encounter: Payer: Self-pay | Admitting: *Deleted

## 2019-10-17 ENCOUNTER — Other Ambulatory Visit: Payer: Self-pay | Admitting: *Deleted

## 2019-10-17 NOTE — Patient Outreach (Signed)
[facility]) Care Management [facility] CM Telephone Outreach, EMMI Red Alert notification/ General Discharge PCP office completes Transition of Care follow up post-hospital discharge Post-hospital discharge day # 7  10/17/2019  Adrian Olson 08/21/1930 [id number]  EMMI Red Alert notification/ General Discharge EMMI call date/ day #: Wednesday October 15, 2019; day # 4 Red-Alert reason(s): "sad/ hopeless/ anxious/ empty"  Successful telephone outreach to Adrian Olson, 85 y/o male referred to [facility] RN CM yesterday by [facility] CMA for EMMI Red-alert notification as above; patient had recent elective hospitalization May 27-28, 2021 for surgical repair of mitral valve for mitral insufficiency; patient had mitral valve clip implant and was discharged home to self-care without home health services in place.  Patient has history including, but not limited to, mitral valve prolapse with regurgitation/ insufficiency; bilateral carotid disease; HTN/ HLD; and mild CHF.  HIPAA/ identity verified and purpose of call/ [facility] CM services were discussed with patient today; patient provides verbal consent to complete EMMI screening call as well as consent to participate in [facility] CM program.  Patient reports "doing well" post-hospital discharge and states he "feels better today" than he has since he was discharged home.  We discussed EMMI Red- alert as above and depression screening was completed.  Patient confirms that he discussed his feelings of depression with his cardiologist at time of post- hospital discharge office visit on October 15, 2019 and confirms that he was prescribed an antidepressant, which he has obtained and is taking.  He describes himself as very active and confirms that he has experienced no loss of interest in the things he likes to do.  He reports that he was previously on antidepressant medication, but did not notice any kind of change in his mood, so he stopped taking; he further reports that  research he did, along with his cardiologist shared it may take several weeks before he notices changes.  He confirms that he has filled his recently prescribed antidepressant and is taking as prescribed.  Patient denies pain, new/ recent falls and sounds to be in no distress throughout phone call today.  Patient further reports:  Medications: -- Has all medicationsand takes as prescribed;denies questions/ concerns around current medications -- self-manage medications independently -- denies issues with swallowing medications -- denies complete medication review today, but agrees to review newly prescribed medications: patient verbalizes a good understanding of all and confirms that he has obtained all and is taking all as prescribed  Provider appointments: -- Attended post-surgical cardiology provider appointment October 15, 2019; "got a good report;" confirms that he discussed feelings of depression and self-limiting episodes of "heart racing at night" with cardiologist -- plans to attend next cardiology office visit as scheduled for post-surgical ECHOcardiogram on November 06, 2019 -- upcoming scheduled PCP office visit in July; not sure exact date; states on calendar, plans to attend  Safety/ Mobility/ Falls: -- denies new/ recent falls- states has not had any falls over last year -- assistive devices: none; reports "occasionally" feels off-balance when he does his regular exercise routine, states he stops and "it goes away;" reports does not happen "very often" -- general fall risks/ prevention education discussed with patient today  Social/ Community Resource needs: -- currently denies community resource needs, stating supportive spouse that assist with care needs as/ if indicated -- reports completely independent in all ADL's and iADL's -- drives self to all provider appointments, errands, etc; wife can assist if needed  Advanced Directive (AD) Planning:   -- reports  does not currently  have exisisting AD in place and very decisively declines desire for information around same  -- describes self as "full" code status  Self-health management of mitral valve insufficiency with recent clip implant: -- reports has had mitral valve issues for awhile, which has led to mild CHF; states that now that clip implant has been completed, he has been told by cardiology provider that his symptoms "should get much better." -- does not currently have concerns around clinical condition; wants to stay active as he continues to age -- has never had symptoms or hospitalizations for signs/ symptoms CHF- "just needed the surgery;" and reports "things should get better now" -- as noted above, has been experiencing occasional self-limiting, "very short" episodes of heart racing at night prior to going to bed: confirms he discussed with cardiology provider during recent office visit; stated home cardiac monitoring was offered, which patient declined, as "it doesn't last long and goes away on it's own;" discussed with patient benefit of quantifying these episodes and recording on paper to take with him to next scheduled cardiology office visit- he is agreeable and states he will do.  Encouraged patient to promptly notify care providers should these episodes increase in occurrence/ duration and he is agreeable  Patient denies further issues, concerns, or problems today.  I provided/ confirmed that patient has my direct phone number, the main [facility] CM office phone number, and the [facility] CM 24-hour nurse advice phone number should issues arise prior to next scheduled [facility] CM outreach.  Encouraged patient to contact me directly if needs, questions, issues, or concerns arise prior to next scheduled outreach; patient agreed to do so.  Plan:  Patient will take medications as prescribed and will attend all scheduled provider appointments  Patient will promptly notify care providers for any new concerns/ issues/ problems  that arise  Patient will continue with his established exercise routine  Patient will begin recording on paper episodes and duration of heart racing, should they continue occurring  I will make patient's PCP aware of [facility] RN CM involvement in patient's care-- will send barriers letter  [facility] CM outreach to continue with scheduled phone call post- next scheduled cardiology office visit, unless indicated sooner  [facility] CM Care Plan Problem One     Most Recent Value  Care Plan Problem One  High Risk for hospital re-admission related to/ as evidenced by recent elective surgery for mitral valve clipping  Role Documenting the Problem One  Care Management Coordinator  Care Plan for Problem One  Active  [facility] Long Term Goal   Over the next 31 days, patient will not experience unplanned hospital re-admission as evidenced by patient reporting and review of EHR during [facility] RN CM outreach  [facility] Long Term Goal Start Date  10/17/19  Interventions for Problem One Long Term Goal  Discussed with patient his currnet conditon and confirmed that he has no current clinical concerns,  EMMI Red- Alert screening call completed and patient engaged in [place] CM program,  [facility] CM initial assessment initiated,  reviewed with patient post-hospital discharge instructions and newly prescribed medications and confirmed that patient has obtained and is taking new medications as prescribed.  [facility] CM Short Term Goal #1   Over the next 30 days, patient will continue regularly walking for exercise as evidenced by patient reporting during [facility] RN CM outreach  [facility] CM Short Term Goal #1 Start Date  10/17/19  Interventions for Short Term Goal #1  Discussed with patient  his regular routine of activity/ exercise/ walking and encouraged him to continue this practice without over-doing post recent surgery,  discussed fall prevention and encouraged patient to maintain his regular routine of activity  [facility] CM Short Term Goal #2   Over the next 30 days,  patient will designate on his calendar any episodes he experiences of heart racing to report to cardiologist at time of next scheduled office visit, as evidenced by patient reporting and collaboration with cardiology care team as indicated during [place] outreach  [facility] CM Short Term Goal #2 Start Date  10/17/19  Interventions for Short Term Goal #2  Discussed patient's reported self-limiting episodes of heart racing during night time hours with him,  confirmed that he discussed with his cardiology provider at time of recent office visit,  encouraged patient to begin recording these episodes and to contact his cardiology provider should they worsen or become longer in duration,  discussed value of quantifying episodes so cardiology team may address if indicated     I appreciate the opportunity to participate in Della's care,  Oneta Rack, RN, BSN, [employer] Coordinator [facility] Care Management  [phone]

## 2019-10-21 ENCOUNTER — Telehealth: Payer: Self-pay | Admitting: Interventional Cardiology

## 2019-10-21 DIAGNOSIS — R002 Palpitations: Secondary | ICD-10-CM

## 2019-10-21 NOTE — Telephone Encounter (Signed)
Let's just place a monitor to be safe. Can you help me get that arranged? Can just be a Zio XT for 14 days (for [place] to read)

## 2019-10-21 NOTE — Telephone Encounter (Signed)
Patient c/o Palpitations:  High priority if patient c/o lightheadedness, shortness of breath, or chest pain  1) How long have you had palpitations/irregular HR/ Afib? Are you having the symptoms now? Patient states palpitations have been off and on since 10/09/19  2) Are you currently experiencing lightheadedness, SOB or CP? No  3) Do you have a history of afib (atrial fibrillation) or irregular heart rhythm? No  4) Have you checked your BP or HR? (document readings if available):  BP: 130/55 HR: 71 BP: 130/57 HR: 65 BP: 125/57 HR: 71 BP: 140/68 HR: 64 BP: 142/69 HR: 86   5) Are you experiencing any other symptoms? No

## 2019-10-21 NOTE — Telephone Encounter (Signed)
Adrian Olson- I know you seen him recently and he mentioned palps to you.  Should we get a monitor or have him give the Prozac more time?

## 2019-10-21 NOTE — Telephone Encounter (Signed)
Spoke with pt and went over recommendations.  Pt agreeable to plan.  Verified address and went over instructions. Pt appreciative for call.

## 2019-10-23 ENCOUNTER — Ambulatory Visit (INDEPENDENT_AMBULATORY_CARE_PROVIDER_SITE_OTHER): Payer: Medicare HMO

## 2019-10-23 DIAGNOSIS — R002 Palpitations: Secondary | ICD-10-CM

## 2019-10-24 DIAGNOSIS — R002 Palpitations: Secondary | ICD-10-CM | POA: Diagnosis not present

## 2019-11-03 ENCOUNTER — Telehealth: Payer: Self-pay | Admitting: *Deleted

## 2019-11-03 NOTE — Telephone Encounter (Addendum)
IRHYTHM was blindly connected to triage on this pts ZIO AT.  Per the rep at [facility], she wanted to let Dr. Tamala Julian know that the pts gateway is no longer transmitting "live monitoring." Rep states that the pts monitor is rapidly blinking, which indicates this is not live monitoring anymore, but still recording.  Rep states that the only "live recording" was 8 days worth of monitoring, and after 6/18, this stopped transmitting live.  Rep states pt is now on day 11, and has 3 days left for monitoring, so she is calling to ask if Dr. Tamala Julian wants zio to send the pt in another monitor to finish off the last 3 days with "live monitoring" or proceed with current monitoring which would be like a regular zio would assess.  Rep states that this occurs if the battery is running low, or the pt drops the monitoring system. Rep states this has nothing to do with how the patch is placed or the adhesive on the patch.  The rep states that the pt confirmed that the patch is appropriately placed and is firmly on the pts skin.  Rep states if Dr. Tamala Julian wants them to send a new monitor for the pt, they can.  She states if he decides this, his nurse should call them back at [phone] Reference # [id number].  Will route this message to Dr. Tamala Julian and his RN for further review and recommendation, and follow-up accordingly as needed.

## 2019-11-04 ENCOUNTER — Telehealth: Payer: Self-pay | Admitting: Interventional Cardiology

## 2019-11-04 NOTE — Telephone Encounter (Signed)
° ° °  Pt said he's having problem sleeping at night, he will wake up in the middle of night and couldn't go back to sleep. He said he feels like he has a lot of anxiety and depression. He wanted to speak with Anderson Malta, Dr. Tamala Julian nurse for help.

## 2019-11-04 NOTE — Telephone Encounter (Signed)
Spoke with pt and he states that unless he takes an Ativan at night, he wakes up in the middle of the night and feels anxious and can't get back to sleep.  Mentioned Melatonin to pt and he said he has tried that before with no luck.  Pt states he does well during the day.  Pt wonders if Prozac needs to be increased.  Advised pt to reach out to PCP for guidance on Prozac and what to use to help with sleep.  Pt verbalized understanding and was appreciative for call.

## 2019-11-05 NOTE — Telephone Encounter (Signed)
Continue with same monitor.

## 2019-11-06 ENCOUNTER — Encounter: Payer: Self-pay | Admitting: Physician Assistant

## 2019-11-06 ENCOUNTER — Other Ambulatory Visit: Payer: Self-pay

## 2019-11-06 ENCOUNTER — Ambulatory Visit (HOSPITAL_COMMUNITY): Payer: Medicare HMO | Attending: Cardiology

## 2019-11-06 ENCOUNTER — Other Ambulatory Visit: Payer: Self-pay | Admitting: Cardiology

## 2019-11-06 ENCOUNTER — Ambulatory Visit: Payer: Medicare HMO | Admitting: Physician Assistant

## 2019-11-06 VITALS — BP 152/70 | HR 59 | Ht 69.0 in | Wt 139.1 lb

## 2019-11-06 DIAGNOSIS — I083 Combined rheumatic disorders of mitral, aortic and tricuspid valves: Secondary | ICD-10-CM | POA: Insufficient documentation

## 2019-11-06 DIAGNOSIS — E119 Type 2 diabetes mellitus without complications: Secondary | ICD-10-CM | POA: Diagnosis not present

## 2019-11-06 DIAGNOSIS — I059 Rheumatic mitral valve disease, unspecified: Secondary | ICD-10-CM | POA: Diagnosis present

## 2019-11-06 DIAGNOSIS — Z95818 Presence of other cardiac implants and grafts: Secondary | ICD-10-CM | POA: Insufficient documentation

## 2019-11-06 DIAGNOSIS — I119 Hypertensive heart disease without heart failure: Secondary | ICD-10-CM | POA: Insufficient documentation

## 2019-11-06 DIAGNOSIS — E785 Hyperlipidemia, unspecified: Secondary | ICD-10-CM | POA: Insufficient documentation

## 2019-11-06 DIAGNOSIS — Z9889 Other specified postprocedural states: Secondary | ICD-10-CM

## 2019-11-06 DIAGNOSIS — R002 Palpitations: Secondary | ICD-10-CM

## 2019-11-06 DIAGNOSIS — I340 Nonrheumatic mitral (valve) insufficiency: Secondary | ICD-10-CM

## 2019-11-06 DIAGNOSIS — I6523 Occlusion and stenosis of bilateral carotid arteries: Secondary | ICD-10-CM

## 2019-11-06 MED ORDER — FLUOXETINE HCL 20 MG PO CAPS: 20.0000 mg | ORAL_CAPSULE | Freq: Every day | ORAL | 2 refills | Status: DC

## 2019-11-06 MED ORDER — FLUOXETINE HCL 20 MG PO CAPS: 20.0000 mg | ORAL_CAPSULE | Freq: Every day | ORAL | 1 refills | Status: DC

## 2019-11-06 NOTE — Progress Notes (Signed)
[facility] [place]                                       Cardiology Office Note    Date:  11/07/2019   ID:  Adrian Olson, DOB 11-02-1930, MRN [id number]  PCP:  Adrian Gravel, MD  Cardiologist:  Dr. Tamala Julian   CC: 1 month s/p MitraClip  History of Present Illness:  Adrian Olson is a 86 y.o. male with a history of hypertension, bilateral carotid artery disease, hyperlipidemia, diabetes, anxiety/depression and mitral valve prolapse with severe mitral regurgitation s/p TEER (10/09/19) who presents to clinic for follow up.   Patient has a longstanding history of systolic murmur and mitral valve prolapse for which she has been followed by Dr. Tamala Julian for many years. He has remained physically active and functionally independent. He has recently admitted decreased exercise capacity with increased exertional shortness of breath and generalized fatigue. He has also been suffering from increasing amounts of anxiety and depression over the past year. He was seen in follow-up by Dr. Tamala Julian and transthoracic echocardiogram performed mitral valve prolapse with severe mitral regurgitation and normal left ventricular systolic function but slightly enlarged left ventricular chamber size. He subsequently underwent TEE and diagnostic cardiac catheterization on Sep 16, 2019. TEE confirmed the presence of mitral valve prolapse with a large flail segment involving a portion of the posterior leaflet causing severe mitral regurgitation. There was severe left atrial enlargement and no left atrial thrombus. There is moderate tricuspid regurgitation. Left ventricular systolic function was felt to be normal with ejection fraction reported 55 to 60%. Diagnostic cardiac catheterization revealed mild nonobstructive coronary artery disease with exception of an eccentric 60% narrowing of the mid left anterior descending coronary artery. Right heart pressures were normal although there  were large V waves on wedge tracing consistent with severe mitral regurgitation.   He was evaluated by the multidisciplinary and underwent successful transcatheter edge to edge repair using 2 MitraClip XTW devices, first device positioned A2 P2, second device positioned A1 P1. Post op echo showed EF 60-65%, mod pulm HTN, stable mitral valve repair with mild to moderate residual MR, mean gradient 5.5 mmHg. He was discharged on POD1 on aspirin and plavix.  At follow up he was doing better but had some worsening anxiety and depression. He was started on prozac 10 mg daily.  Today he presents to clinic for follow up. No CP or SOB. No LE edema, orthopnea or PND. No dizziness or syncope. No blood in stool or urine. He does get some palpitations in his chest when he lays down at night that lasts minutes. Improves with changing positions. Thinks he is doing better on prozac but wants to increase the dose.     Past Medical History:  Diagnosis Date  . Bilateral carotid artery disease (HCC)    Severe Left and moderate Right  . Diabetes mellitus without complication ([place])   . ED (erectile dysfunction)   . Hyperlipidemia   . Hypertension   . Lumbar disc disease   . Mitral valve prolapse    moderate to severe MR, LVEF 55% Echo 2010  . Moderate mitral regurgitation 03/16/2014  . S/P mitral valve clip implantation 10/09/2019   s/p TEER using 2 MitraClip XTW devices, first device positioned A2 P2, second device positioned A1 P1 by Dr. Burt Knack    Past Surgical History:  Procedure Laterality Date  . CARDIAC CATHETERIZATION    . MITRAL VALVE REPAIR  10/09/2019   MITRAL VALVE REPAIR (N/A )  . MITRAL VALVE REPAIR N/A 10/09/2019   Procedure: MITRAL VALVE REPAIR;  Surgeon: Sherren Mocha, MD;  Location: [facility];  Service: Cardiovascular;  Laterality: N/A;  . RIGHT/LEFT HEART CATH AND CORONARY ANGIOGRAPHY N/A 09/16/2019   Procedure: RIGHT/LEFT HEART CATH AND CORONARY ANGIOGRAPHY;  Surgeon: Belva Crome, MD;  Location: [facility];  Service: Cardiovascular;  Laterality: N/A;  . TEE WITHOUT CARDIOVERSION N/A 09/16/2019   Procedure: TRANSESOPHAGEAL ECHOCARDIOGRAM (TEE);  Surgeon: Lelon Perla, MD;  Location: [facility];  Service: Cardiovascular;  Laterality: N/A;    Current Medications: Outpatient Medications Prior to Visit  Medication Sig Dispense Refill  . acetaminophen (TYLENOL) 500 MG tablet Take 1,000 mg by mouth every 6 (six) hours as needed (for pain.).    Marland Kitchen amoxicillin (AMOXIL) 500 MG tablet Take 4 capsules (2,000 mg) one hour prior to all dental visits. 8 tablet 12  . aspirin EC 81 MG tablet Take 81 mg by mouth at bedtime.    . Cholecalciferol (VITAMIN D) 2000 UNITS tablet Take 2,000 Units by mouth daily at 12 noon.     Marland Kitchen CINNAMON PO Take 15 mLs by mouth daily.    . clopidogrel (PLAVIX) 75 MG tablet Take 1 tablet (75 mg total) by mouth daily. 90 tablet 0  . finasteride (PROSCAR) 5 MG tablet Take 5 mg by mouth daily at 12 noon.     . hydrochlorothiazide (MICROZIDE) 12.5 MG capsule Take 12.5 mg by mouth daily.    Marland Kitchen levothyroxine (SYNTHROID, LEVOTHROID) 75 MCG tablet Take 75 mcg by mouth daily before breakfast.    . metFORMIN (GLUCOPHAGE) 500 MG tablet Take 500 mg by mouth every evening.     . pravastatin (PRAVACHOL) 20 MG tablet Take 20 mg by mouth at bedtime.     . psyllium (METAMUCIL) 58.6 % packet Take 1 packet by mouth daily.    . ramipril (ALTACE) 5 MG capsule Take 5 mg by mouth daily.     . sodium chloride (OCEAN) 0.65 % SOLN nasal spray Place 1 spray into both nostrils daily as needed for congestion.    . tamsulosin (FLOMAX) 0.4 MG CAPS capsule Take 0.4 mg by mouth daily at 12 noon.     Marland Kitchen FLUoxetine (PROZAC) 10 MG capsule Take 1 capsule (10 mg total) by mouth daily. 30 capsule 2   No facility-administered medications prior to visit.     Allergies:   Patient has no known allergies.   Social History   Socioeconomic History  . Marital status: Married     Spouse name: Not on file  . Number of children: Not on file  . Years of education: Not on file  . Highest education level: Not on file  Occupational History  . Not on file  Tobacco Use  . Smoking status: Never Smoker  . Smokeless tobacco: Never Used  Vaping Use  . Vaping Use: Never used  Substance and Sexual Activity  . Alcohol use: No    Alcohol/week: 0.0 standard drinks  . Drug use: No  . Sexual activity: Not on file  Other Topics Concern  . Not on file  Social History Narrative  . Not on file   Social Determinants of Health   Financial Resource Strain:   . Difficulty of Paying Living Expenses:   Food Insecurity: No Food Insecurity  . Worried About  Running Out of Food in the Last Year: Never true  . Ran Out of Food in the Last Year: Never true  Transportation Needs: No Transportation Needs  . Lack of Transportation (Medical): No  . Lack of Transportation (Non-Medical): No  Physical Activity:   . Days of Exercise per Week:   . Minutes of Exercise per Session:   Stress:   . Feeling of Stress :   Social Connections:   . Frequency of Communication with Friends and Family:   . Frequency of Social Gatherings with Friends and Family:   . Attends Religious Services:   . Active Member of Clubs or Organizations:   . Attends Archivist Meetings:   Marland Kitchen Marital Status:      Family History:  The patient's family history includes Cancer in his mother; Dementia in his sister; Diabetes in his father and sister; Heart disease in his father and sister; Obesity in his sister; Other in his father and sister; Stroke in his father.     ROS:   Please see the history of present illness.    ROS All other systems reviewed and are negative.   PHYSICAL EXAM:   VS:  BP (!) 152/70   Pulse (!) 59   Ht 5\' 9"  (1.753 m)   Wt 139 lb 1.9 oz (63.1 kg)   SpO2 98%   BMI 20.54 kg/m    GEN: Well nourished, well developed, in no acute distress HEENT: normal Neck: no JVD or  masses Cardiac: RRR; 3/6 holosystolic murmur at apex. no rubs, or gallops,no edema  Respiratory:  clear to auscultation bilaterally, normal work of breathing GI: soft, nontender, nondistended, + BS MS: no deformity or atrophy Skin: warm and dry, no rash Neuro:  Alert and Oriented x 3, Strength and sensation are intact Psych: euthymic mood, full affect   Wt Readings from Last 3 Encounters:  11/06/19 139 lb 1.9 oz (63.1 kg)  10/15/19 140 lb (63.5 kg)  10/09/19 142 lb (64.4 kg)      Studies/Labs Reviewed:   EKG:  EKG is NOT ordered today.    Recent Labs: 10/07/2019: ALT 14; B Natriuretic Peptide 245.8 10/10/2019: BUN 19; Creatinine, Ser 1.09; Hemoglobin 10.8; Platelets 143; Potassium 3.8; Sodium 134   Lipid Panel No results found for: CHOL, TRIG, HDL, CHOLHDL, VLDL, LDLCALC, LDLDIRECT  Additional studies/ records that were reviewed today include:   10/09/19 MITRAL VALVE REPAIR  Conclusion Successful transcatheter edge to edge mitral valve repair using 2 MitraClip XTW devices, first device positioned A2 P2, second device positioned A1 P1. Reduction in mitral regurgitation from 4+ to 1+.  Recommendations  Antiplatelet/Anticoag Recommend uninterrupted dual antiplatelet therapy with Aspirin 81mg  daily and Clopidogrel 75mg  daily for 3 months.   __________________   Echo 10/10/19 IMPRESSIONS  1. Day 1 post two MitraClips placement. There is residual mild to  moderate mitral regurgitation. Mean transmitral gradient 5 mmHg.  2. Left ventricular ejection fraction, by estimation, is 60 to 65%. The  left ventricle has normal function. The left ventricle has no regional  wall motion abnormalities. Left ventricular diastolic parameters are  consistent with Grade II diastolic  dysfunction (pseudonormalization). Elevated left ventricular end-diastolic  pressure.  3. Right ventricular systolic function is normal. The right ventricular  size is normal. There is moderately elevated  pulmonary artery systolic  pressure. The estimated right ventricular systolic pressure is 66.0 mmHg.  4. Left atrial size was severely dilated.  5. Right atrial size was moderately dilated.  6. The  mitral valve has been repaired/replaced. Mild to moderate mitral  valve regurgitation. Mild to moderate mitral stenosis.  7. The aortic valve is normal in structure. Aortic valve regurgitation is  mild. Mild to moderate aortic valve sclerosis/calcification is present,  without any evidence of aortic stenosis.  8. The inferior vena cava is dilated in size with <50% respiratory  variability, suggesting right atrial pressure of 15 mmHg.    _____________________   Echo 11/06/19 IMPRESSIONS  1. 1 month post MitraClip procedure with placement of two MitraClips.  Mitral regurgitation remains mild to moderate, mean transmitral gradient  is 6 mmHg.  2. Left ventricular ejection fraction, by estimation, is 60 to 65%. The  left ventricle has normal function. The left ventricle has no regional  wall motion abnormalities. The left ventricular internal cavity size was  moderately dilated. Left ventricular  diastolic function could not be evaluated.  3. Right ventricular systolic function is normal. The right ventricular  size is normal. There is moderately elevated pulmonary artery systolic  pressure. The estimated right ventricular systolic pressure is 54.6 mmHg.  4. Left atrial size was moderately dilated.  5. The mitral valve has been repaired/replaced. Mild to moderate mitral  valve regurgitation. Moderate mitral stenosis. The mean mitral valve  gradient is 6.0 mmHg with average heart rate of 62 bpm. There is a  Mitra-Clip present in the mitral position.  Procedure Date: 10/09/19.  6. Tricuspid valve regurgitation is moderate to severe.  7. The aortic valve is normal in structure. Aortic valve regurgitation is  mild to moderate. No aortic stenosis is present.  8. Mild pulmonic  stenosis.  9. The inferior vena cava is normal in size with greater than 50%  respiratory variability, suggesting right atrial pressure of 3 mmHg.    ASSESSMENT & PLAN:   Mitral valve prolapse with severe MR s/p TEER:  Echo today shows EF 60%, moderate LV dilation, normally functioning Mitraclip x2 with a mild-mod residual MR and a mean gradient of 6 mmHg, moderate pulmonary HTN, mod-severe TR and mild to mod AR. He has NYHA class II symptoms. He has amoxicillin for SBE prophylaxis. Continue on aspirin and plavix. He will stop plavix 75mg  daily after 3 months of therapy ( 12/2019).  Chronic diastolic CHF: appears euvolemic. Continue on HCTZ 12.5 mg daily.   HTN: Bp elevated today. He said he has white coat HTN and its been running okay at home. No changes made.   Anxiety/depression: thinks prozac is helping but too low of a dose. Will increase to 20mg  daily and he will follow up with Dr. Maudie Mercury  Medication Adjustments/Labs and Tests Ordered: Current medicines are reviewed at length with the patient today.  Concerns regarding medicines are outlined above.  Medication changes, Labs and Tests ordered today are listed in the Patient Instructions below. Patient Instructions  Medication Instructions:  1) INCREASE PROZAC to 20 mg daily 2) You may STOP PLAVIX August 27 *If you need a refill on your cardiac medications before your next appointment, please call your pharmacy*   Follow-Up: Your follow-up appointments have been scheduled!    Signed, Angelena Form, PA-C  11/07/2019 2:40 PM    [facility] [address], [place], [place]  [address] Phone: [phone]; Fax: [phone]

## 2019-11-06 NOTE — Patient Instructions (Signed)
Medication Instructions:  1) INCREASE PROZAC to 20 mg daily 2) You may STOP PLAVIX August 27 *If you need a refill on your cardiac medications before your next appointment, please call your pharmacy*   Follow-Up: Your follow-up appointments have been scheduled!

## 2019-11-07 ENCOUNTER — Encounter: Payer: Self-pay | Admitting: *Deleted

## 2019-11-07 ENCOUNTER — Other Ambulatory Visit: Payer: Self-pay | Admitting: *Deleted

## 2019-11-07 NOTE — Patient Outreach (Signed)
[facility]) Care Management [place] PCP office completes Transition of Care follow up post-hospital discharge Post-hospital discharge day # 28 without unplanned hospital re-admission  11/07/2019  Adrian Olson 09-Jul-1930 [id number]  Successful telephone outreach to Adrian Olson, 86 y/o male referred to [facility] RN CM 10/16/19 by [facility] CMA for EMMI Red-alert notification; patient had recent elective hospitalization May 27-28, 2021 for surgical repair of mitral valve for mitral insufficiency; patient had mitral valve clip implant and was discharged home to self-care without home health services in place.  Patient has history including, but not limited to, mitral valve prolapse with regurgitation/ insufficiency; bilateral carotid disease; HTN/ HLD; and mild CHF.  HIPAA/ identity verified.  Patient reports doing well; states that he remains somewhat weak post- surgery and has discussed with care providers.  Confirms that he attended yesterday's cardiology office visit and discussed with provider his ongoing depression and previously reported episodes of heart racing which he believes have started to decrease.  Patient confirms that cardiologist had ordered in-home cardiac monitoring post- surgery which he reports he wore and returned by mail yesterday; awaiting follow up results from in-home monitoring.  Confirmed that cardiologist increased his dosage of antidepressant which he has started taking.  Patient denies clinical concerns today and sounds to be in no distress throughout phone call.  Patient further reports:  -- continues to self-manage medications; continues to deny medication concerns; patient was recently discharged from hospital and all medications were thoroughly reviewed; no concerns or discrepancies noted; patient verbalizes a very good general understanding of the purpose/ dosage/ scheduling of medications  -- accurate understanding of all upcoming  provider appointments with plans to attend all as scheduled  -- monitors/ records daily weights at home; reports has done "for years;" verbalizes a good understanding of purpose of daily weights and reports ongoing weight ranges at home between "146-147 lbs"  -- does not follow specific diet; reports "eats healthy;" reports decreased appetite; does not currently use supplements; suggested patient consider trying low-sugar/ carb supplementation for decreased appetite  Patient denies further issues, concerns, or problems today. I provided/ confirmed that patient hasmy direct phone number, the main [facility] CM office phone number, and the [facility] CM 24-hour nurse advice phone number should issues arise prior to next scheduled [facility] CM outreach.  Encouraged patient to contact me directly if needs, questions, issues, or concerns arise prior to next scheduled outreach; patient agreed to do so.  Plan:  Patient will take medications as prescribed and will attend all scheduled provider appointments  Patient will promptly notify care providers for any new concerns/ issues/ problems that arise  Patient will continue his established exercise routine  Patient will contiue recording on paper episodes and duration of heart racing, should they continue occurring  I will share today's notes/ care plan with patient's PCP as [facility] RN CM initial assessment  [facility] CM outreach to continue with scheduled phone call in 2 weeks to follow up on results of recently placed heart monitor at home, unless indicated sooner  [facility] CM Care Plan Problem One     Most Recent Value  Care Plan Problem One High Risk for hospital re-admission related to/ as evidenced by recent elective surgery for mitral valve clipping  Role Documenting the Problem One Care Management Coordinator  Care Plan for Problem One Active  [facility] Long Term Goal  Over the next 14 days, patient will not experience unplanned hospital re-admission as evidenced by  patient reporting and review  of EHR during [facility] RN CM outreach  [goal extended/modified today]  [facility] Long Term Goal Start Date 11/07/19  Barrie Folk modified/ extended today]  Interventions for Problem One Long Term Goal Discussed with patient current clinical conditon and reviewed with him recent office visit with cardiologist,  confirmed that patient has no current clinical concerns,  medication review completed,  [facility] RN CM initial assessment completed and shared with patient's PCP  [facility] CM Short Term Goal #1  Over the next 14 days, patient will continue regularly walking for exercise as evidenced by patient reporting during [facility] RN CM outreach  [Goal Modified/ extended today]  [facility] CM Short Term Goal #1 Start Date 11/07/19  [Goal modified and extended today]  Interventions for Short Term Goal #1 Confirmed that patient continues to walk short distances post- recent surgery,  discussed with patient his ongoing activity tolerance and ongoing post-surgery weakness and confirmed that he has reported to care providers  [facility] CM Short Term Goal #2  Over the next 14 days, patient will designate on his calendar any episodes he experiences of heart racing to report to cardiologist at time of next scheduled office visit, as evidenced by patient reporting and collaboration with cardiology care team as indicated during [facility] RN CM outreach  [goal modified/ extended today]  [facility] CM Short Term Goal #2 Start Date 11/07/19  [Goal modified/ extended today]  Interventions for Short Term Goal #2 Confirmed that patient has been recording episodes at home and discussed with cardiology providers,  discussed recently placed home cardiac monitor intervention and confirmed that patient has completed this and is awaiting results of same,  confirmed that patient believes that these episodes are decreasing somewhat over last few days     Oneta Rack, RN, BSN, [employer] Coordinator [facility] Care Management  [phone]

## 2019-11-19 DIAGNOSIS — F410 Panic disorder [episodic paroxysmal anxiety] without agoraphobia: Secondary | ICD-10-CM | POA: Diagnosis not present

## 2019-11-19 DIAGNOSIS — R69 Illness, unspecified: Secondary | ICD-10-CM | POA: Diagnosis not present

## 2019-11-21 ENCOUNTER — Encounter: Payer: Self-pay | Admitting: *Deleted

## 2019-11-21 ENCOUNTER — Other Ambulatory Visit: Payer: Self-pay | Admitting: *Deleted

## 2019-11-21 NOTE — Patient Outreach (Signed)
Adrian Olson) Care Management [facility] CM Telephone Outreach Post-Olson discharge day # 42 without unplanned Olson re-admission  11/21/2019  Adrian Olson [birth year] [id number]  Successful telephone outreach to Adrian Olson, 86 y/o male referred to [facility] RN CM 10/16/19 by Adrian Olson for EMMI Red-alert notification; patient had recent elective hospitalization May 27-28, 2037for surgical repair of mitral valve for mitral insufficiency; patient had mitral valve clip implant and was discharged home to self-care without home health services in place. Patient has history including, but not limited to, mitral valve prolapse with regurgitation/ insufficiency; bilateral carotid disease; HTN/ HLD; and mild CHF.  HIPAA/ identity verified.  Patient reports doing well "overall," states that he visited with PCP this week and was prescribed new antidepressant; has obtained new medication (medication list in EHR updated according to patient report today) but has question about how he should start new medication (Effexor) and stop old medication (prozac); confirms that his PCP office is currently closed due it being Friday afternoon; state he asked the pharmacist at his local outpatient pharmacy and was advised to call PCP for further clarification; patient plans to continue established medication for now and contact PCP for further instruction on Monday- this was encouraged.  Patient denies clinical concerns today and sounds to be in no distress throughout phone call.  Patient further reports:  -- accurate understanding of all upcoming provider appointments with plans to attend all as scheduled- continues driving self  -- monitors/ records daily weights at home; reports has done "for years;" verbalizes a good understanding of purpose of daily weights and reports ongoing weight ranges at home consistently between "138-140 lbs" since last outreach  -- continues exercising/ walking "about every  day;" this was again encouraged  -- reviewed results form recent heart monitoring at home: patient very glad that no serous concerns were identified as a result of recent home cardiac monitoring  Patient denies further issues, concerns, or problems today. Iconfirmed that patient hasmy direct phone number, the main [facility] CM office phone number, and the Adrian Olson CM 24-hour nurse advice phone number should issues arise prior to next scheduled [facility] CM outreach next month. Encouraged patient to contact me directly if needs, questions, issues, or concerns arise prior to next scheduled outreach; patient agreed to do so.  Plan:  Patient will take medications as prescribed and will attend all scheduled provider appointments  Patient will promptly notify care providers for any new concerns/ issues/ problems that arise  Patient will continue his established exercise routine  [facility] CM outreach to continue with scheduled phone call next month, unless indicated sooner  Adrian Rack, RN, BSN, [place] Care Management  [phone]

## 2019-11-24 ENCOUNTER — Ambulatory Visit (HOSPITAL_COMMUNITY)
Admission: RE | Admit: 2019-11-24 | Discharge: 2019-11-24 | Disposition: A | Discharge status: Home / Self Care | Payer: Medicare HMO | Source: Ambulatory Visit | Attending: Cardiovascular Disease | Admitting: Cardiovascular Disease

## 2019-11-24 ENCOUNTER — Telehealth: Payer: Self-pay | Admitting: Interventional Cardiology

## 2019-11-24 ENCOUNTER — Other Ambulatory Visit: Payer: Self-pay

## 2019-11-24 ENCOUNTER — Other Ambulatory Visit (HOSPITAL_COMMUNITY): Payer: Self-pay | Admitting: Interventional Cardiology

## 2019-11-24 DIAGNOSIS — I6523 Occlusion and stenosis of bilateral carotid arteries: Secondary | ICD-10-CM | POA: Diagnosis not present

## 2019-11-24 NOTE — Telephone Encounter (Signed)
Pt called wanted to discuss some things with Dr. Thompson Caul nurse. Please call back

## 2019-11-24 NOTE — Telephone Encounter (Signed)
Spoke with pt and he states that on his paperwork at last visit it says to continue current medications.  Pt wanted to know if he is to continue Plavix.  Advised pt, per AVS, he continues Plavix for now and may stop it on 9/27.  Pt verbalized understanding and was appreciative for call.

## 2019-12-22 DIAGNOSIS — N400 Enlarged prostate without lower urinary tract symptoms: Secondary | ICD-10-CM | POA: Diagnosis not present

## 2019-12-22 DIAGNOSIS — I10 Essential (primary) hypertension: Secondary | ICD-10-CM | POA: Diagnosis not present

## 2019-12-22 DIAGNOSIS — M8589 Other specified disorders of bone density and structure, multiple sites: Secondary | ICD-10-CM | POA: Diagnosis not present

## 2019-12-22 DIAGNOSIS — E039 Hypothyroidism, unspecified: Secondary | ICD-10-CM | POA: Diagnosis not present

## 2019-12-22 DIAGNOSIS — R739 Hyperglycemia, unspecified: Secondary | ICD-10-CM | POA: Diagnosis not present

## 2019-12-23 ENCOUNTER — Encounter: Payer: Self-pay | Admitting: *Deleted

## 2019-12-23 ENCOUNTER — Other Ambulatory Visit: Payer: Self-pay | Admitting: *Deleted

## 2019-12-23 NOTE — Patient Outreach (Signed)
[place] [facility]) Care Management [facility] CM Telephone Outreach Post-hospital discharge day # 74 without unplanned hospital re-admission Unsuccessful (consecutive) outreach attempt # 1- previously engaged patient  12/23/2019  Adrian Olson September 03, 1930 [id number]  Unsuccessful telephone outreach to Adrian Olson, 86 y/o male referred to [place] CM6/03/21by [place] for EMMI Red-alert notification; patient had recent elective hospitalization May 27-28, 2059for surgical repair of mitral valve for mitral insufficiency; patient had mitral valve clip implant and was discharged home to self-care without home health services in place. Patient has history including, but not limited to, mitral valve prolapse with regurgitation/ insufficiency; bilateral carotid disease; HTN/ HLD; and mild CHF.  HIPAA compliant voice mail message left for patient, requesting return call back.  Plan:  Will re-attempt [facility] CM telephone outreach within 4 business days if I do not hear back from patient first  Oneta Rack, RN, BSN, [employer] Coordinator [facility] Care Management  [phone]

## 2019-12-25 ENCOUNTER — Other Ambulatory Visit: Payer: Self-pay | Admitting: *Deleted

## 2019-12-25 ENCOUNTER — Encounter: Payer: Self-pay | Admitting: *Deleted

## 2019-12-25 NOTE — Patient Outreach (Signed)
[facility] [facility]) Care Management [facility] CM Telephone Outreach, established patient Post-hospital discharge day # 76  12/25/2019  STEPHON WEATHERS [birth year] [id number]  Successful telephone outreach to [name], 86 y/o male referred to [place] CM6/03/21by [place] for EMMI Red-alert notification; patient had recent elective hospitalization May 27-28, 2088for surgical repair of mitral valve for mitral insufficiency; patient had mitral valve clip implant and was discharged home to self-care without home health services in place. Patient has history including, but not limited to, mitral valve prolapse with regurgitation/ insufficiency; bilateral carotid disease; HTN/ HLD; and mild CHF.  HIPAA/ identity verified. Patient reports doing "much better than the last we talked;" reports he has weaned himself off of the prozac he had been prescribed and "can tell a big difference;" stating that his appetite is better, he is sleeping well at night, and continuing to complete his regular home exercise routine of daily walking at least a mile.  Patient denies clinical concerns/ issues/ problems, and sounds to be in no distress throughout phone call today.  -- verbalizes accurate understanding to stop plavix as advised by cardiology on January 09, 2020 -- has not yet started effexor; plans to discuss with PCP team 12/29/19 at time of scheduled appointment -- no other changes to medications- continues self-managing -- continues driving and remains independent in self-care activities  Patient denies further issues, concerns, or problems today. Discussed with patient that he has thus far made great progress in meeting his established [facility] CM goals, and possibility of case closure at time of next outreach; patient is agreeable.  Iconfirmed that patient hasmy direct phone number, the main [facility] CM office phone number, and the [facility] CM 24-hour nurse advice phone number should issues arise prior to next  scheduled [facility] CM outreach next month. Encouraged patient to contact me directly if needs, questions, issues, or concerns arise prior to next scheduled outreach; patient agreed to do so.  Plan:  Patient will take medications as prescribed and will attend all scheduled provider appointments  Patient will promptly notify care providers for any new concerns/ issues/ problems that arise  Patient will continue his established exercise routine and daily weights at home  [facility] CM outreach to continue with scheduled phone call next month, unless indicated sooner  Oneta Rack, RN, BSN, [place] Care Management  [phone]

## 2020-01-04 DIAGNOSIS — R69 Illness, unspecified: Secondary | ICD-10-CM | POA: Diagnosis not present

## 2020-01-14 DIAGNOSIS — E039 Hypothyroidism, unspecified: Secondary | ICD-10-CM | POA: Diagnosis not present

## 2020-01-14 DIAGNOSIS — I10 Essential (primary) hypertension: Secondary | ICD-10-CM | POA: Diagnosis not present

## 2020-01-14 DIAGNOSIS — M4003 Postural kyphosis, cervicothoracic region: Secondary | ICD-10-CM | POA: Diagnosis not present

## 2020-01-14 DIAGNOSIS — E7800 Pure hypercholesterolemia, unspecified: Secondary | ICD-10-CM | POA: Diagnosis not present

## 2020-01-14 DIAGNOSIS — R69 Illness, unspecified: Secondary | ICD-10-CM | POA: Diagnosis not present

## 2020-01-14 DIAGNOSIS — R739 Hyperglycemia, unspecified: Secondary | ICD-10-CM | POA: Diagnosis not present

## 2020-01-14 DIAGNOSIS — Z0000 Encounter for general adult medical examination without abnormal findings: Secondary | ICD-10-CM | POA: Diagnosis not present

## 2020-01-21 DIAGNOSIS — N401 Enlarged prostate with lower urinary tract symptoms: Secondary | ICD-10-CM | POA: Diagnosis not present

## 2020-01-21 DIAGNOSIS — R972 Elevated prostate specific antigen [PSA]: Secondary | ICD-10-CM | POA: Diagnosis not present

## 2020-01-21 DIAGNOSIS — N5201 Erectile dysfunction due to arterial insufficiency: Secondary | ICD-10-CM | POA: Diagnosis not present

## 2020-01-21 DIAGNOSIS — R351 Nocturia: Secondary | ICD-10-CM | POA: Diagnosis not present

## 2020-01-30 DIAGNOSIS — H5203 Hypermetropia, bilateral: Secondary | ICD-10-CM | POA: Diagnosis not present

## 2020-01-30 DIAGNOSIS — E119 Type 2 diabetes mellitus without complications: Secondary | ICD-10-CM | POA: Diagnosis not present

## 2020-01-30 DIAGNOSIS — Z961 Presence of intraocular lens: Secondary | ICD-10-CM | POA: Diagnosis not present

## 2020-02-04 ENCOUNTER — Encounter: Payer: Self-pay | Admitting: *Deleted

## 2020-02-04 ENCOUNTER — Other Ambulatory Visit: Payer: Self-pay | Admitting: *Deleted

## 2020-02-05 NOTE — Patient Outreach (Addendum)
[place] [facility]) Care Management [facility] CM Telephone Outreach Post-hospital discharge day # 118 without unplanned hospital readmission  02/05/2020  Adrian Olson [birth year] [id number]  Successful telephone outreach to [name], 86 y/o male referred to [place] CM6/03/21by [place] for EMMI Red-alert notification; patient had recent elective hospitalization May 27-28, 2056for surgical repair of mitral valve for mitral insufficiency; patient had mitral valve clip implant and was discharged home to self-care without home health services in place. Patient has history including, but not limited to, mitral valve prolapse with regurgitation/ insufficiency; bilateral carotid disease; HTN/ HLD; and mild CHF.  HIPAA/ identity verified. Patient reports "doing fantastic" today, and states that he 'is better than" he has been in quite some time.  Reports that he stopped taking his prozac and depression medications,as previously discussed with his doctor; adds that he is also not taking medications for anxiety and is also off of plavix, after taking as instructed post- recent MV clipping.  Reports "feels drastically better" being off of these medications, is sleeping better and has a better appetite.  He denies current clinical concerns and sounds to be in no distress throughout phone call today.  -- discussed current clinical condition with patient; confirmed no clinical concerns -- reviewed recent medication changes with patient; confirmed no medication concerns -- confirmed that patient has attended all scheduled provider appointments; reviewed upcoming scheduled provider appointments with patient and confirmed that he continues to drive self-- cardiology appointment 03/08/20 -- confirmed patient continues his exercise program; walking every day for 45 minutes, twice daily -- reviewed recent weights at home and confirmed that patient has had no weight fluctuations; remains consistently  between 138-140 -- discussed with patient his possible desire to change PCP, due to his PCP being out of office due to illness; provided several potential practices close to his home along with phone numbers and encouraged him to consider contacting.  Patient denies further issues, concerns, or problems today. We had previously discussed that he has thus far made great progress in meeting his established [facility] CM goals, and possibility of case closure at time of next outreach-- today we agree to one additional outreach call next month post-cardiology provider office visit to review post-office visit instructions; patient is agreeable.  Iconfirmed that patient hasmy direct phone number, the main [facility] CM office phone number, and the [facility] CM 24-hour nurse advice phone number should issues arise prior to next scheduled [facility] CM outreachnext month. Encouraged patient to contact me directly if needs, questions, issues, or concerns arise prior to next scheduled outreach; patient agreed to do so.  Plan:  Patient will take medications as prescribed and will attend all scheduled provider appointments  Patient will promptly notify care providers for any new concerns/ issues/ problems that arise  Patient will continue his established exercise routine and regular weights at home  [facility] CM outreach to continue with scheduled phone callnext month, possibly for case closure, unless indicated sooner  Oneta Rack, RN, BSN, [place] Coordinator [place] Management  [phone]             -- discussed current clinical condition with patient; confirmed no clinical concerns -- reviewed recent medication changes with patient -- confirmed that patient has attended all scheduled provider appointments; reviewed upcoming scheduled provider appointments with patient and confirmed that he continues to drive self -- confirmed patient continues his exercise program; walking  every day for 45 minutes, twice daily -- reviewed recent weights at  home and confirmed that patient has had no weight fluctuations

## 2020-02-13 DIAGNOSIS — Z23 Encounter for immunization: Secondary | ICD-10-CM | POA: Diagnosis not present

## 2020-03-07 NOTE — Progress Notes (Signed)
Cardiology Office Note:    Date:  03/08/2020   ID:  Adrian Olson, DOB 06-02-1930, MRN [id number]  PCP:  Adrian Gravel, MD  Cardiologist:  Adrian Grooms, MD   Referring MD: Adrian Gravel, MD   Chief Complaint  Patient presents with  . Atrial Fibrillation  . Cardiac Valve Problem    History of Present Illness:    Adrian Olson is a 86 y.o. male with a hx of a hx of degenerative mitral valve disease,severemitral regurgitation, hypertension, and bilateral carotid artery disease.Also has hyperlipidemia, hypertension, and ED.Underwent TEER  (MitraClip 10/09/19).   He is doing well. Starting a month or two after MitraClip, he developed fatigue, anorexia, weight loss, and difficulty with ambulation. There was no shortness of breath or other cardiac complaints.  His daughter looked up side effects of some of the medications he was taking, and they determine it might be related. At that point they weaned Prozac, Ativan, and ultimately discontinued Plavix. After 2 weeks he began feeling stronger, eating better, gaining weight, and is now back to his typical level of physical activity which includes walking more than 30 minutes every day.  He denies orthopnea and PND.  Past Medical History:  Diagnosis Date  . Bilateral carotid artery disease (HCC)    Severe Left and moderate Right  . Diabetes mellitus without complication ([place])   . ED (erectile dysfunction)   . Hyperlipidemia   . Hypertension   . Lumbar disc disease   . Mitral valve prolapse    moderate to severe MR, LVEF 55% Echo 2010  . Moderate mitral regurgitation 03/16/2014  . S/P mitral valve clip implantation 10/09/2019   s/p TEER using 2 MitraClip XTW devices, first device positioned A2 P2, second device positioned A1 P1 by Dr. Burt Knack    Past Surgical History:  Procedure Laterality Date  . CARDIAC CATHETERIZATION    . MITRAL VALVE REPAIR  10/09/2019   MITRAL VALVE REPAIR (N/A )  . MITRAL VALVE REPAIR N/A 10/09/2019    Procedure: MITRAL VALVE REPAIR;  Surgeon: Sherren Mocha, MD;  Location: [place] CV LAB;  Service: Cardiovascular;  Laterality: N/A;  . RIGHT/LEFT HEART CATH AND CORONARY ANGIOGRAPHY N/A 09/16/2019   Procedure: RIGHT/LEFT HEART CATH AND CORONARY ANGIOGRAPHY;  Surgeon: Belva Crome, MD;  Location: [place] CV [facility];  Service: Cardiovascular;  Laterality: N/A;  . TEE WITHOUT CARDIOVERSION N/A 09/16/2019   Procedure: TRANSESOPHAGEAL ECHOCARDIOGRAM (TEE);  Surgeon: Lelon Perla, MD;  Location: [facility] ENDOSCOPY;  Service: Cardiovascular;  Laterality: N/A;    Current Medications: Current Meds  Medication Sig  . acetaminophen (TYLENOL) 500 MG tablet Take 1,000 mg by mouth every 6 (six) hours as needed (for pain.).  Marland Kitchen amoxicillin (AMOXIL) 500 MG tablet Take 4 capsules (2,000 mg) one hour prior to all dental visits.  Marland Kitchen aspirin EC 81 MG tablet Take 81 mg by mouth at bedtime.  . Cholecalciferol (VITAMIN D) 2000 UNITS tablet Take 2,000 Units by mouth daily at 12 noon.   . finasteride (PROSCAR) 5 MG tablet Take 5 mg by mouth daily at 12 noon.   . hydrochlorothiazide (MICROZIDE) 12.5 MG capsule Take 12.5 mg by mouth daily.  Marland Kitchen levothyroxine (SYNTHROID, LEVOTHROID) 75 MCG tablet Take 75 mcg by mouth daily before breakfast.  . metFORMIN (GLUCOPHAGE) 500 MG tablet Take 500 mg by mouth every evening.   . pravastatin (PRAVACHOL) 20 MG tablet Take 20 mg by mouth at bedtime.   . psyllium (METAMUCIL) 58.6 % packet Take  1 packet by mouth daily.  . ramipril (ALTACE) 5 MG capsule Take 5 mg by mouth daily.   . sodium chloride (OCEAN) 0.65 % SOLN nasal spray Place 1 spray into both nostrils daily as needed for congestion.  . tamsulosin (FLOMAX) 0.4 MG CAPS capsule Take 0.4 mg by mouth daily at 12 noon.      Allergies:   Patient has no known allergies.   Social History   Socioeconomic History  . Marital status: Married    Spouse name: Not on file  . Number of children: Not on file  . Years of education: Not  on file  . Highest education level: Not on file  Occupational History  . Not on file  Tobacco Use  . Smoking status: Never Smoker  . Smokeless tobacco: Never Used  Vaping Use  . Vaping Use: Never used  Substance and Sexual Activity  . Alcohol use: No    Alcohol/week: 0.0 standard drinks  . Drug use: No  . Sexual activity: Not on file  Other Topics Concern  . Not on file  Social History Narrative  . Not on file   Social Determinants of Health   Financial Resource Strain:   . Difficulty of Paying Living Expenses: Not on file  Food Insecurity: No Food Insecurity  . Worried About Charity fundraiser in the Last Year: Never true  . Ran Out of Food in the Last Year: Never true  Transportation Needs: No Transportation Needs  . Lack of Transportation (Medical): No  . Lack of Transportation (Non-Medical): No  Physical Activity:   . Days of Exercise per Week: Not on file  . Minutes of Exercise per Session: Not on file  Stress:   . Feeling of Stress : Not on file  Social Connections:   . Frequency of Communication with Friends and Family: Not on file  . Frequency of Social Gatherings with Friends and Family: Not on file  . Attends Religious Services: Not on file  . Active Member of Clubs or Organizations: Not on file  . Attends Archivist Meetings: Not on file  . Marital Status: Not on file     Family History: The patient's family history includes Cancer in his mother; Dementia in his sister; Diabetes in his father and sister; Heart disease in his father and sister; Obesity in his sister; Other in his father and sister; Stroke in his father.  ROS:   Please see the history of present illness.    He denies angina. He has not had syncope. He denies orthopnea. There have been no transient neurological symptoms. He wonders if he can get off Plavix. All other systems reviewed and are negative.  EKGs/Labs/Other Studies Reviewed:    The following studies were reviewed  today:  72-Hour Monitor 11/18/2019: Addendum by Belva Crome, MD on Tue Nov 18, 2019 11:52 AM  Underlying rhythm is normal sinus rhythm varying between 41 bpm and 160 bpm with average heart rate 61 bpm.  177 nonsustained SVT episodes. Longest lasting 27 seconds. Fastest 166 bpm for 6 beats.  Premature atrial contractions accounting for approximately 2% burden of heartbeats.  Rare PVCs less than 1% burden.  No instances of atrial fibrillation.  ECHOCARDIOGRAM 11/06/2019: IMPRESSIONS    1. 1 month post MitraClip procedure with placement of two MitraClips.  Mitral regurgitation remains mild to moderate, mean transmitral gradient  is 6 mmHg.  2. Left ventricular ejection fraction, by estimation, is 60 to 65%. The  left  ventricle has normal function. The left ventricle has no regional  wall motion abnormalities. The left ventricular internal cavity size was  moderately dilated. Left ventricular  diastolic function could not be evaluated.  3. Right ventricular systolic function is normal. The right ventricular  size is normal. There is moderately elevated pulmonary artery systolic  pressure. The estimated right ventricular systolic pressure is 73.4 mmHg.  4. Left atrial size was moderately dilated.  5. The mitral valve has been repaired/replaced. Mild to moderate mitral  valve regurgitation. Moderate mitral stenosis. The mean mitral valve  gradient is 6.0 mmHg with average heart rate of 62 bpm. There is a  Mitra-Clip present in the mitral position.  Procedure Date: 10/09/19.  6. Tricuspid valve regurgitation is moderate to severe.  7. The aortic valve is normal in structure. Aortic valve regurgitation is  mild to moderate. No aortic stenosis is present.  8. Mild pulmonic stenosis.  9. The inferior vena cava is normal in size with greater than 50%  respiratory variability, suggesting right atrial pressure of 3 mmHg.   EKG:  EKG not repeated  Recent Labs: 10/07/2019: ALT  14; B Natriuretic Peptide 245.8 10/10/2019: BUN 19; Creatinine, Ser 1.09; Hemoglobin 10.8; Platelets 143; Potassium 3.8; Sodium 134  Recent Lipid Panel No results found for: CHOL, TRIG, HDL, CHOLHDL, VLDL, LDLCALC, LDLDIRECT  Physical Exam:    VS:  There were no vitals taken for this visit.    Wt Readings from Last 3 Encounters:  11/06/19 139 lb 1.9 oz (63.1 kg)  10/15/19 140 lb (63.5 kg)  10/09/19 142 lb (64.4 kg)     GEN: Slender/frail appearing. No acute distress HEENT: Normal NECK: No JVD. LYMPHATICS: No lymphadenopathy CARDIAC: Left mid and lower parasternal, apical, and left axillary 3/6 holosystolic mitral regurgitation murmur. RRR without diastolic murmur, gallop, or edema. VASCULAR:  Normal Pulses. No bruits. RESPIRATORY:  Clear to auscultation without rales, wheezing or rhonchi  ABDOMEN: Soft, non-tender, non-distended, No pulsatile mass, MUSCULOSKELETAL: No deformity  SKIN: Warm and dry NEUROLOGIC:  Alert and oriented x 3 PSYCHIATRIC:  Normal affect   ASSESSMENT:    1. S/P mitral valve clip implantation   2. Other depression   3. Severe mitral regurgitation   4. Bilateral carotid artery stenosis   5. Palpitations   6. Chronic diastolic CHF (congestive heart failure) ([place])   7. Hypertension, unspecified type   8. Educated about COVID-19 virus infection    PLAN:    In order of problems listed above:  1. Status post MitraClip x2. Echocardiographic reduction in severity of mitral regurgitation. The patient states that he feels better since MitraClip. He does not feel his heart pounding and racing as much as previous. He is back to walking, and feels improved. He understands endocarditis prophylaxis. 2. Improved off antidepressant therapy. 3. Severe mitral regurgitation is reduced to mild to moderate. 4. Continue taking aspirin 81 mg daily. Continue pravastatin therapy. 5. Monitor and report if problems arise. 6. No evidence of volume overload 7. Excellent  current blood pressure on Microzide 12.5 mg/day, Altace 5 mg/day. 8. He is vaccinated and has not suffered COVID-19 infection.  6-8 months follow-up.   Medication Adjustments/Labs and Tests Ordered: Current medicines are reviewed at length with the patient today.  Concerns regarding medicines are outlined above.  No orders of the defined types were placed in this encounter.  No orders of the defined types were placed in this encounter.   There are no Patient Instructions on file for this visit.  Signed, Adrian Grooms, MD  03/08/2020 1:14 PM    [facility]

## 2020-03-08 ENCOUNTER — Encounter: Payer: Self-pay | Admitting: Interventional Cardiology

## 2020-03-08 ENCOUNTER — Ambulatory Visit: Payer: Medicare HMO | Admitting: Interventional Cardiology

## 2020-03-08 ENCOUNTER — Other Ambulatory Visit: Payer: Self-pay

## 2020-03-08 VITALS — BP 122/62 | HR 64 | Ht 69.0 in | Wt 139.0 lb

## 2020-03-08 DIAGNOSIS — Z95818 Presence of other cardiac implants and grafts: Secondary | ICD-10-CM

## 2020-03-08 DIAGNOSIS — I6523 Occlusion and stenosis of bilateral carotid arteries: Secondary | ICD-10-CM | POA: Diagnosis not present

## 2020-03-08 DIAGNOSIS — I10 Essential (primary) hypertension: Secondary | ICD-10-CM

## 2020-03-08 DIAGNOSIS — Z7189 Other specified counseling: Secondary | ICD-10-CM

## 2020-03-08 DIAGNOSIS — Z9889 Other specified postprocedural states: Secondary | ICD-10-CM

## 2020-03-08 DIAGNOSIS — I5032 Chronic diastolic (congestive) heart failure: Secondary | ICD-10-CM | POA: Diagnosis not present

## 2020-03-08 DIAGNOSIS — F3289 Other specified depressive episodes: Secondary | ICD-10-CM

## 2020-03-08 DIAGNOSIS — I340 Nonrheumatic mitral (valve) insufficiency: Secondary | ICD-10-CM | POA: Diagnosis not present

## 2020-03-08 DIAGNOSIS — R002 Palpitations: Secondary | ICD-10-CM | POA: Diagnosis not present

## 2020-03-08 DIAGNOSIS — R69 Illness, unspecified: Secondary | ICD-10-CM | POA: Diagnosis not present

## 2020-03-08 NOTE — Patient Instructions (Signed)
Medication Instructions:  Your physician recommends that you continue on your current medications as directed. Please refer to the Current Medication list given to you today.  *If you need a refill on your cardiac medications before your next appointment, please call your pharmacy*   Lab Work: None If you have labs (blood work) drawn today and your tests are completely normal, you will receive your results only by: . MyChart Message (if you have MyChart) OR . A paper copy in the mail If you have any lab test that is abnormal or we need to change your treatment, we will call you to review the results.   Testing/Procedures: None   Follow-Up: At [facility], you and your health needs are our priority.  As part of our continuing mission to provide you with exceptional heart care, we have created designated Provider Care Teams.  These Care Teams include your primary Cardiologist (physician) and Advanced Practice Providers (APPs -  Physician Assistants and Nurse Practitioners) who all work together to provide you with the care you need, when you need it.  We recommend signing up for the patient portal called "MyChart".  Sign up information is provided on this After Visit Summary.  MyChart is used to connect with patients for Virtual Visits (Telemedicine).  Patients are able to view lab/test results, encounter notes, upcoming appointments, etc.  Non-urgent messages can be sent to your provider as well.   To learn more about what you can do with MyChart, go to [email or web address]://www.mychart.com.    Your next appointment:   6-8 month(s)  The format for your next appointment:   In Person  Provider:   You may see Henry W Smith III, MD or one of the following Advanced Practice Providers on your designated Care Team:    Lori Gerhardt, NP  Laura Ingold, NP  Jill McDaniel, NP    Other Instructions   

## 2020-03-09 ENCOUNTER — Other Ambulatory Visit: Payer: Self-pay | Admitting: *Deleted

## 2020-03-09 ENCOUNTER — Encounter: Payer: Self-pay | Admitting: *Deleted

## 2020-03-09 NOTE — Patient Outreach (Signed)
Adrian Olson) Care Management [facility] CM Telephone Outreach, Case  Closure- Goals successfully met Post- (elective) Olson discharge day # 151 without unplanned Olson re-admission  03/09/2020  Adrian Olson June 15, 1930 [id number]  Successful telephone outreach to Adrian Olson, 86 y/o male referred to [facility] RN CM6/03/21by [place] for EMMI Red-alert notification; patient had recent elective hospitalization May 27-28, 2017fr surgical repair of mitral valve for mitral insufficiency; patient had mitral valve clip implant and was discharged home to self-care without home health services in place. Patient has history including, but not limited to, mitral valve prolapse with regurgitation/ insufficiency; bilateral carotid disease; HTN/ HLD; and mild CHF.  HIPAA/ identity verified. Patient again reports "doing fantastic" today, and states that he "is still doing great," attended yesterday's scheduled cardiology office visit, and "got a great report;" reviewed medication changes as previously reported; patient has not had any subsequent changes.  Continues walking every day for 30-60 minutes.  He denies current clinical concerns and sounds to be in no distress throughout phone call today.  Continues thinking about possibility of changing PCP providers, has decided to hold off for now and reconsider after the first of the upcoming new year.  SDOH updated, no concerns identified.  Patient continues driving self and remains independent in ADL/ iADL's.  Patient denies further issues, concerns, or problems today. We had previously discussed that he has thus far made great progress in meeting his established [facility] goals, and possibility of case closure at time of today's outreach-- today patient opts for case closure, as he acknowledges that he has successfully met his [facility] goals and patient verbalizes no ongoing care coordination/ disease management/ pharmacy/ community resource needs.   He agrees to contact [facility] in the future should needs arise.   Plan:  [facility]/ make patient inactive with [facility] CM and will make PCP aware of same.  LOneta Rack RN, BSN, CInt[facility] CorporationTMercy [facility] Center[facility] Management  ([phone]  Goals Addressed              This Visit's Progress   .  COMPLETED: [facility] CM: "I want to continue recovering from my surgery and return to my normal routine" (pt-stated)   On track     [place]see longitudinal plan of care for additional care plan information)  Current Barriers:  . Chronic Disease Management support and education needs related to heart valve disease and recent surgery  Nurse Case Manager Clinical Goal(s):  .Marland KitchenOver the next 90 days, patient will attend all scheduled medical appointments 12/25/19: on track; goal maintained; 02/04/20: on track; goal maintained . Over the next 30 days, patient will demonstrate understanding of rationale for each prescribed medication as evidenced by medication review at time of next scheduled outreach 12/25/19: on track; goal re-established ; 02/04/20: on track; goal met . Over the next 30 days, patient will continue his weekly exercise routine  12/25/19: on track; 02/04/20: on track; goal maintained  Interventions:  . Inter-disciplinary care team collaboration (see longitudinal plan of care) . Evaluation of current treatment plan related to recent surgery and patient's adherence to plan as established by provider. . Advised patient to contact PCP to have questions addressed around recently prescribed new medication . Discussed plans with patient for ongoing care management follow up and provided patient with direct contact information for care management team . Advised patient, providing education and rationale, to continue to weigh daily and record,  calling care providers for weight gain of 3lbs overnight or 5 pounds in a week.   12/25/19: -- reviewed/  updated medication list according to patient report of current meds; confirmed patient has accurate understanding of date to stop taking plavix -- reviewed recent/ upcoming provider appointments: confirmed patient has scheduled PCP office visit 12/29/19 -- confirmed that patient has no clinical concerns and continues to be independent in ADL's/ iADL's and has continue with his established exercise routine  02/04/20: -- discussed current clinical condition with patient; confirmed no clinical concerns -- reviewed recent medication changes with patient -- confirmed that patient has attended all scheduled provider appointments; reviewed upcoming scheduled provider appointments with patient and confirmed that he continues to drive self -- confirmed patient continues his exercise program; walking every day for 45 minutes, twice daily -- reviewed recent weights at home and confirmed that patient has had no weight fluctuations  03/09/20: -- reviewed recent cardiology provider appointment -- updated SDOH- no concerns identified -- reviewed medication changes and confirmed patient continues self-managing and has no medication concerns -- case closure: patient has successfully met previously established [facility] CM goals and verbalizes no ongoing care coordination/ disease management/ pharmacy/ community resource needs   Patient Self Care Activities:  . Patient verbalizes understanding of plan to monitor/ record daily weights and continue established exercise program . Self administers medications as prescribed . Attends all scheduled provider appointments . Calls pharmacy for medication refills . Attends church or other social activities . Performs ADL's independently . Performs IADL's independently . Calls provider office for new concerns or questions- patient verbalizes will call PCP to have questions answered around newly prescribed medication 11/19/19  Initial goal documentation 12/25/19:  updated 02/04/20: updated 03/09/20: case closure; patient successfully met goals  It has been a pleasure caring for Mr. [place], Ventrella Jamse Arn, RN, BSN, [place] Coordinator [facility] Care Management  [phone]         Oneta Rack, RN, BSN, [place] Coordinator [place] [facility] Care Management  [phone]

## 2020-04-10 DIAGNOSIS — R69 Illness, unspecified: Secondary | ICD-10-CM | POA: Diagnosis not present

## 2020-07-14 DIAGNOSIS — E039 Hypothyroidism, unspecified: Secondary | ICD-10-CM | POA: Diagnosis not present

## 2020-07-14 DIAGNOSIS — E7800 Pure hypercholesterolemia, unspecified: Secondary | ICD-10-CM | POA: Diagnosis not present

## 2020-07-14 DIAGNOSIS — R739 Hyperglycemia, unspecified: Secondary | ICD-10-CM | POA: Diagnosis not present

## 2020-07-21 DIAGNOSIS — E7800 Pure hypercholesterolemia, unspecified: Secondary | ICD-10-CM | POA: Diagnosis not present

## 2020-07-21 DIAGNOSIS — R69 Illness, unspecified: Secondary | ICD-10-CM | POA: Diagnosis not present

## 2020-07-21 DIAGNOSIS — R739 Hyperglycemia, unspecified: Secondary | ICD-10-CM | POA: Diagnosis not present

## 2020-07-21 DIAGNOSIS — E039 Hypothyroidism, unspecified: Secondary | ICD-10-CM | POA: Diagnosis not present

## 2020-07-21 DIAGNOSIS — I10 Essential (primary) hypertension: Secondary | ICD-10-CM | POA: Diagnosis not present

## 2020-07-21 DIAGNOSIS — Z952 Presence of prosthetic heart valve: Secondary | ICD-10-CM | POA: Diagnosis not present

## 2020-07-27 DIAGNOSIS — D485 Neoplasm of uncertain behavior of skin: Secondary | ICD-10-CM | POA: Diagnosis not present

## 2020-07-27 DIAGNOSIS — L570 Actinic keratosis: Secondary | ICD-10-CM | POA: Diagnosis not present

## 2020-07-27 DIAGNOSIS — L218 Other seborrheic dermatitis: Secondary | ICD-10-CM | POA: Diagnosis not present

## 2020-08-04 DIAGNOSIS — L570 Actinic keratosis: Secondary | ICD-10-CM | POA: Diagnosis not present

## 2020-08-04 DIAGNOSIS — C44329 Squamous cell carcinoma of skin of other parts of face: Secondary | ICD-10-CM | POA: Diagnosis not present

## 2020-08-04 DIAGNOSIS — L989 Disorder of the skin and subcutaneous tissue, unspecified: Secondary | ICD-10-CM | POA: Diagnosis not present

## 2020-08-04 DIAGNOSIS — D485 Neoplasm of uncertain behavior of skin: Secondary | ICD-10-CM | POA: Diagnosis not present

## 2020-09-20 DIAGNOSIS — I10 Essential (primary) hypertension: Secondary | ICD-10-CM | POA: Diagnosis not present

## 2020-09-20 DIAGNOSIS — G3184 Mild cognitive impairment, so stated: Secondary | ICD-10-CM | POA: Diagnosis not present

## 2020-09-20 DIAGNOSIS — E785 Hyperlipidemia, unspecified: Secondary | ICD-10-CM | POA: Diagnosis not present

## 2020-09-20 DIAGNOSIS — N400 Enlarged prostate without lower urinary tract symptoms: Secondary | ICD-10-CM | POA: Diagnosis not present

## 2020-09-20 DIAGNOSIS — N529 Male erectile dysfunction, unspecified: Secondary | ICD-10-CM | POA: Diagnosis not present

## 2020-09-20 DIAGNOSIS — E1142 Type 2 diabetes mellitus with diabetic polyneuropathy: Secondary | ICD-10-CM | POA: Diagnosis not present

## 2020-09-20 DIAGNOSIS — Z7982 Long term (current) use of aspirin: Secondary | ICD-10-CM | POA: Diagnosis not present

## 2020-09-20 DIAGNOSIS — F419 Anxiety disorder, unspecified: Secondary | ICD-10-CM | POA: Diagnosis not present

## 2020-09-20 DIAGNOSIS — R69 Illness, unspecified: Secondary | ICD-10-CM | POA: Diagnosis not present

## 2020-09-20 DIAGNOSIS — E039 Hypothyroidism, unspecified: Secondary | ICD-10-CM | POA: Diagnosis not present

## 2020-09-20 DIAGNOSIS — Z008 Encounter for other general examination: Secondary | ICD-10-CM | POA: Diagnosis not present

## 2020-10-04 DIAGNOSIS — D229 Melanocytic nevi, unspecified: Secondary | ICD-10-CM | POA: Diagnosis not present

## 2020-10-04 DIAGNOSIS — L821 Other seborrheic keratosis: Secondary | ICD-10-CM | POA: Diagnosis not present

## 2020-10-04 DIAGNOSIS — L814 Other melanin hyperpigmentation: Secondary | ICD-10-CM | POA: Diagnosis not present

## 2020-10-04 DIAGNOSIS — L905 Scar conditions and fibrosis of skin: Secondary | ICD-10-CM | POA: Diagnosis not present

## 2020-10-04 DIAGNOSIS — Z85828 Personal history of other malignant neoplasm of skin: Secondary | ICD-10-CM | POA: Diagnosis not present

## 2020-10-04 DIAGNOSIS — L819 Disorder of pigmentation, unspecified: Secondary | ICD-10-CM | POA: Diagnosis not present

## 2020-10-04 DIAGNOSIS — D1801 Hemangioma of skin and subcutaneous tissue: Secondary | ICD-10-CM | POA: Diagnosis not present

## 2020-10-04 DIAGNOSIS — L570 Actinic keratosis: Secondary | ICD-10-CM | POA: Diagnosis not present

## 2020-10-12 NOTE — Progress Notes (Deleted)
[facility] [place]                                       Cardiology Office Note    Date:  10/12/2020   ID:  Adrian Olson, DOB 02-07-1931, MRN [id number]  PCP:  [name], MD  Cardiologist:  Dr. Tamala Olson   CC: 1 year s/p MitraClip  History of Present Illness:  Adrian Olson is a 87 y.o. male with a history of HTN, bilateral carotid artery disease, HLD, diabetes, anxiety/depression and mitral valve prolapse with severe mitral regurgitation s/p TEER (10/09/19) who presents to clinic for follow up.   Patient has a longstanding history of systolic murmur and mitral valve prolapse for which she has been followed by Dr. Tamala Olson for many years. He has remained physically active and functionally independent. Last year he developed decreased exercise capacity with increased exertional shortness of breath. TTE 06/2019 showed mitral valve prolapse with severe mitral regurgitation and normal left ventricular systolic function but slightly enlarged left ventricular chamber size. He subsequently underwent TEE and diagnostic cardiac catheterization on Sep 16, 2019. TEE confirmed the presence of mitral valve prolapse with a large flail segment involving a portion of the posterior leaflet causing severe mitral regurgitation. There was severe left atrial enlargement and no left atrial thrombus. There was moderate tricuspid regurgitation. Left ventricular systolic function was felt to be normal with ejection fraction reported 55 to 60%. Diagnostic cardiac catheterization revealed mild nonobstructive coronary artery disease with exception of an eccentric 60% narrowing of the mid left anterior descending coronary artery. Right heart pressures were normal although there were large V waves on wedge tracing consistent with severe mitral regurgitation.   He was evaluated by the multidisciplinary and underwent successful transcatheter edge to edge repair using 2 MitraClip XTW  devices, first device positioned A2 P2, second device positioned A1 P1. Post op echo showed EF 60-65%, mod pulm HTN, stable mitral valve repair with mild to moderate residual MR, mean gradient 5.5 mmHg. He was discharged on POD1 on aspirin and plavix.  At follow up he was doing better but had some worsening anxiety and depression. He was started on prozac 10 mg daily, which was later increased. 1 month echo showed EF 60%, moderate LV dilation, normally functioning Mitraclip x2 with a mild-mod residual MR and a mean gradient of 6 mmHg, moderate pulmonary HTN, mod-severe TR and mild to mod AR.  Today he presents to clinic for follow up.    Past Medical History:  Diagnosis Date  . Bilateral carotid artery disease (HCC)    Severe Left and moderate Right  . Diabetes mellitus without complication ([place])   . ED (erectile dysfunction)   . Hyperlipidemia   . Hypertension   . Lumbar disc disease   . Mitral valve prolapse    moderate to severe MR, LVEF 55% Echo 2010  . Moderate mitral regurgitation 03/16/2014  . S/P mitral valve clip implantation 10/09/2019   s/p TEER using 2 MitraClip XTW devices, first device positioned A2 P2, second device positioned A1 P1 by Dr. Burt Knack    Past Surgical History:  Procedure Laterality Date  . CARDIAC CATHETERIZATION    . MITRAL VALVE REPAIR  10/09/2019   MITRAL VALVE REPAIR (N/A )  . MITRAL VALVE REPAIR N/A 10/09/2019   Procedure: MITRAL VALVE REPAIR;  Surgeon: Sherren Mocha, MD;  Location:  [facility] INVASIVE CV LAB;  Service: Cardiovascular;  Laterality: N/A;  . RIGHT/LEFT HEART CATH AND CORONARY ANGIOGRAPHY N/A 09/16/2019   Procedure: RIGHT/LEFT HEART CATH AND CORONARY ANGIOGRAPHY;  Surgeon: Belva Crome, MD;  Location: [place] CV LAB;  Service: Cardiovascular;  Laterality: N/A;  . TEE WITHOUT CARDIOVERSION N/A 09/16/2019   Procedure: TRANSESOPHAGEAL ECHOCARDIOGRAM (TEE);  Surgeon: Lelon Perla, MD;  Location: [facility] ENDOSCOPY;  Service: Cardiovascular;   Laterality: N/A;    Current Medications: Outpatient Medications Prior to Visit  Medication Sig Dispense Refill  . acetaminophen (TYLENOL) 500 MG tablet Take 1,000 mg by mouth every 6 (six) hours as needed (for pain.).    Marland Kitchen amoxicillin (AMOXIL) 500 MG tablet Take 4 capsules (2,000 mg) one hour prior to all dental visits. 8 tablet 12  . aspirin EC 81 MG tablet Take 81 mg by mouth at bedtime.    . Cholecalciferol (VITAMIN D) 2000 UNITS tablet Take 2,000 Units by mouth daily at 12 noon.     . finasteride (PROSCAR) 5 MG tablet Take 5 mg by mouth daily at 12 noon.     . hydrochlorothiazide (MICROZIDE) 12.5 MG capsule Take 12.5 mg by mouth daily.    Marland Kitchen levothyroxine (SYNTHROID, LEVOTHROID) 75 MCG tablet Take 75 mcg by mouth daily before breakfast.    . metFORMIN (GLUCOPHAGE) 500 MG tablet Take 500 mg by mouth every evening.     . pravastatin (PRAVACHOL) 20 MG tablet Take 20 mg by mouth at bedtime.     . psyllium (METAMUCIL) 58.6 % packet Take 1 packet by mouth daily.    . ramipril (ALTACE) 5 MG capsule Take 5 mg by mouth daily.     . sodium chloride (OCEAN) 0.65 % SOLN nasal spray Place 1 spray into both nostrils daily as needed for congestion.    . tamsulosin (FLOMAX) 0.4 MG CAPS capsule Take 0.4 mg by mouth daily at 12 noon.     . Thiamine HCl (VITAMIN B1 PO) Take 125 mcg by mouth daily.    . vitamin B-12 (CYANOCOBALAMIN) 1000 MCG tablet Take 2,000 mcg by mouth daily.     No facility-administered medications prior to visit.     Allergies:   Patient has no known allergies.   Social History   Socioeconomic History  . Marital status: Married    Spouse name: Not on file  . Number of children: Not on file  . Years of education: Not on file  . Highest education level: Not on file  Occupational History  . Not on file  Tobacco Use  . Smoking status: Never Smoker  . Smokeless tobacco: Never Used  Vaping Use  . Vaping Use: Never used  Substance and Sexual Activity  . Alcohol use: No     Alcohol/week: 0.0 standard drinks  . Drug use: No  . Sexual activity: Not on file  Other Topics Concern  . Not on file  Social History Narrative  . Not on file   Social Determinants of Health   Financial Resource Strain: Not on file  Food Insecurity: No Food Insecurity  . Worried About Charity fundraiser in the Last Year: Never true  . Ran Out of Food in the Last Year: Never true  Transportation Needs: No Transportation Needs  . Lack of Transportation (Medical): No  . Lack of Transportation (Non-Medical): No  Physical Activity: Not on file  Stress: Not on file  Social Connections: Not on file     Family History:  The patient's  family history includes Cancer in his mother; Dementia in his sister; Diabetes in his father and sister; Heart disease in his father and sister; Obesity in his sister; Other in his father and sister; Stroke in his father.     ROS:   Please see the history of present illness.    ROS All other systems reviewed and are negative.   PHYSICAL EXAM:   VS:  There were no vitals taken for this visit.   GEN: Well nourished, well developed, in no acute distress HEENT: normal Neck: no JVD or masses Cardiac: RRR; 3/6 holosystolic murmur at apex. no rubs, or gallops,no edema  Respiratory:  clear to auscultation bilaterally, normal work of breathing GI: soft, nontender, nondistended, + BS MS: no deformity or atrophy Skin: warm and dry, no rash Neuro:  Alert and Oriented x 3, Strength and sensation are intact Psych: euthymic mood, full affect   Wt Readings from Last 3 Encounters:  03/08/20 139 lb (63 kg)  11/06/19 139 lb 1.9 oz (63.1 kg)  10/15/19 140 lb (63.5 kg)      Studies/Labs Reviewed:   EKG:  EKG is NOT ordered today.    Recent Labs: No results found for requested labs within last 8760 hours.   Lipid Panel No results found for: CHOL, TRIG, HDL, CHOLHDL, VLDL, LDLCALC, LDLDIRECT  Additional studies/ records that were reviewed today include:    10/09/19 MITRAL VALVE REPAIR  Conclusion Successful transcatheter edge to edge mitral valve repair using 2 MitraClip XTW devices, first device positioned A2 P2, second device positioned A1 P1. Reduction in mitral regurgitation from 4+ to 1+.  Recommendations  Antiplatelet/Anticoag Recommend uninterrupted dual antiplatelet therapy with Aspirin 81mg  daily and Clopidogrel 75mg  daily for 3 months.   __________________   Echo 10/10/19 IMPRESSIONS  1. Day 1 post two MitraClips placement. There is residual mild to  moderate mitral regurgitation. Mean transmitral gradient 5 mmHg.  2. Left ventricular ejection fraction, by estimation, is 60 to 65%. The  left ventricle has normal function. The left ventricle has no regional  wall motion abnormalities. Left ventricular diastolic parameters are  consistent with Grade II diastolic  dysfunction (pseudonormalization). Elevated left ventricular end-diastolic  pressure.  3. Right ventricular systolic function is normal. The right ventricular  size is normal. There is moderately elevated pulmonary artery systolic  pressure. The estimated right ventricular systolic pressure is 95.2 mmHg.  4. Left atrial size was severely dilated.  5. Right atrial size was moderately dilated.  6. The mitral valve has been repaired/replaced. Mild to moderate mitral  valve regurgitation. Mild to moderate mitral stenosis.  7. The aortic valve is normal in structure. Aortic valve regurgitation is  mild. Mild to moderate aortic valve sclerosis/calcification is present,  without any evidence of aortic stenosis.  8. The inferior vena cava is dilated in size with <50% respiratory  variability, suggesting right atrial pressure of 15 mmHg.    _____________________   Echo 11/06/19 IMPRESSIONS  1. 1 month post MitraClip procedure with placement of two MitraClips.  Mitral regurgitation remains mild to moderate, mean transmitral gradient  is 6 mmHg.  2. Left  ventricular ejection fraction, by estimation, is 60 to 65%. The  left ventricle has normal function. The left ventricle has no regional  wall motion abnormalities. The left ventricular internal cavity size was  moderately dilated. Left ventricular  diastolic function could not be evaluated.  3. Right ventricular systolic function is normal. The right ventricular  size is normal. There is moderately elevated  pulmonary artery systolic  pressure. The estimated right ventricular systolic pressure is 14.7 mmHg.  4. Left atrial size was moderately dilated.  5. The mitral valve has been repaired/replaced. Mild to moderate mitral  valve regurgitation. Moderate mitral stenosis. The mean mitral valve  gradient is 6.0 mmHg with average heart rate of 62 bpm. There is a  Mitra-Clip present in the mitral position.  Procedure Date: 10/09/19.  6. Tricuspid valve regurgitation is moderate to severe.  7. The aortic valve is normal in structure. Aortic valve regurgitation is  mild to moderate. No aortic stenosis is present.  8. Mild pulmonic stenosis.  9. The inferior vena cava is normal in size with greater than 50%  respiratory variability, suggesting right atrial pressure of 3 mmHg.   ______________________  Echo 10/13/2020 ***  ASSESSMENT & PLAN:   Mitral valve prolapse with severe MR s/p TEER:   has NYHA class II symptoms. He has amoxicillin for SBE prophylaxis. .  Chronic diastolic CHF: appears euvolemic. Continue on HCTZ 12.5 mg daily.   HTN: Bp elevated today. He said he has white coat HTN and its been running okay at home. No changes made.    Medication Adjustments/Labs and Tests Ordered: Current medicines are reviewed at length with the patient today.  Concerns regarding medicines are outlined above.  Medication changes, Labs and Tests ordered today are listed in the Patient Instructions below. There are no Patient Instructions on file for this visit.   Signed, Angelena Form,  PA-C  10/12/2020 4:17 PM    [facility], [place], [place]  [address] Phone: [phone]; Fax: [phone]

## 2020-10-13 ENCOUNTER — Ambulatory Visit: Payer: Medicare HMO | Admitting: Physician Assistant

## 2020-10-13 ENCOUNTER — Other Ambulatory Visit (HOSPITAL_COMMUNITY): Payer: Medicare HMO

## 2020-11-23 ENCOUNTER — Ambulatory Visit (HOSPITAL_COMMUNITY)
Admission: RE | Admit: 2020-11-23 | Discharge: 2020-11-23 | Disposition: A | Discharge status: Home / Self Care | Payer: Medicare HMO | Source: Ambulatory Visit | Attending: Cardiovascular Disease | Admitting: Cardiovascular Disease

## 2020-11-23 ENCOUNTER — Other Ambulatory Visit: Payer: Self-pay

## 2020-11-23 ENCOUNTER — Other Ambulatory Visit (HOSPITAL_COMMUNITY): Payer: Self-pay | Admitting: Interventional Cardiology

## 2020-11-23 DIAGNOSIS — I6523 Occlusion and stenosis of bilateral carotid arteries: Secondary | ICD-10-CM

## 2020-11-30 NOTE — Progress Notes (Addendum)
Cardiology Office Note:    Date:  12/01/2020   ID:  Adrian Olson, DOB [birth year], MRN [id number]  PCP:  Holland Commons, FNP  Cardiologist:  [name], MD   Referring MD: [name], MD   Chief Complaint  Patient presents with   Cardiac Valve Problem    History of Present Illness:    Adrian Olson is a 87 y.o. male with a hx of degenerative mitral valve disease, severe mitral regurgitation, hypertension, and bilateral carotid artery disease.  Also has hyperlipidemia, hypertension, ED, and Transcatheter EER  (MitraClip 10/09/19).  He is currently doing okay.  He walks 2 miles daily.  Is more difficult to walk now than it has been over the last 5 years.  Has been no detrimental change since MitraClip was performed over a year ago.  Echo was done today.  I reviewed it briefly and there is still significant mitral regurgitation and I wonder about the possibility of mitral stenosis.  He denies angina, syncope, but has had some palpitations.  Past Medical History:  Diagnosis Date   Bilateral carotid artery disease (HCC)    Severe Left and moderate Right   Diabetes mellitus without complication [facility])    ED (erectile dysfunction)    Hyperlipidemia    Hypertension    Lumbar disc disease    Mitral valve prolapse    moderate to severe MR, LVEF 55% Echo 2010   Moderate mitral regurgitation 03/16/2014   S/P mitral valve clip implantation 10/09/2019   s/p TEER using 2 MitraClip XTW devices, first device positioned A2 P2, second device positioned A1 P1 by Dr. Burt Knack    Past Surgical History:  Procedure Laterality Date   CARDIAC CATHETERIZATION     MITRAL VALVE REPAIR  10/09/2019   MITRAL VALVE REPAIR (N/A )   MITRAL VALVE REPAIR N/A 10/09/2019   Procedure: MITRAL VALVE REPAIR;  Surgeon: Sherren Mocha, MD;  Location: [place] [facility];  Service: Cardiovascular;  Laterality: N/A;   RIGHT/LEFT HEART CATH AND CORONARY ANGIOGRAPHY N/A 09/16/2019   Procedure: RIGHT/LEFT HEART  CATH AND CORONARY ANGIOGRAPHY;  Surgeon: Belva Crome, MD;  Location: [place] [facility];  Service: Cardiovascular;  Laterality: N/A;   TEE WITHOUT CARDIOVERSION N/A 09/16/2019   Procedure: TRANSESOPHAGEAL ECHOCARDIOGRAM (TEE);  Surgeon: Lelon Perla, MD;  Location: [facility];  Service: Cardiovascular;  Laterality: N/A;    Current Medications: Current Meds  Medication Sig   acetaminophen (TYLENOL) 500 MG tablet Take 1,000 mg by mouth every 6 (six) hours as needed (for pain.).   amoxicillin (AMOXIL) 500 MG tablet Take 4 capsules (2,000 mg) one hour prior to all dental visits.   aspirin 81 MG EC tablet 1 tablet   chlordiazePOXIDE (LIBRIUM) 10 MG capsule Take 1 tablet by mouth daily.   Cholecalciferol (VITAMIN D) 2000 UNITS tablet Take 2,000 Units by mouth daily at 12 noon.    Cyanocobalamin (VITAMIN B-12) 2000 MCG TBCR 1 tablet   finasteride (PROSCAR) 5 MG tablet Take 5 mg by mouth daily at 12 noon.    hydrochlorothiazide (MICROZIDE) 12.5 MG capsule Take 12.5 mg by mouth daily.   levothyroxine (SYNTHROID, LEVOTHROID) 75 MCG tablet Take 75 mcg by mouth daily before breakfast.   metFORMIN (GLUCOPHAGE) 500 MG tablet Take 500 mg by mouth every evening.    pravastatin (PRAVACHOL) 20 MG tablet Take 20 mg by mouth at bedtime.    psyllium (METAMUCIL) 58.6 % packet Take 1 packet by mouth daily.   ramipril (  ALTACE) 5 MG capsule Take 5 mg by mouth daily.    sodium chloride (OCEAN) 0.65 % SOLN nasal spray Place 1 spray into both nostrils daily as needed for congestion.   tamsulosin (FLOMAX) 0.4 MG CAPS capsule Take 0.4 mg by mouth daily at 12 noon.    Thiamine HCl (VITAMIN B1 PO) Take 125 mcg by mouth daily.   [DISCONTINUED] aspirin EC 81 MG tablet Take 81 mg by mouth at bedtime.   [DISCONTINUED] vitamin B-12 (CYANOCOBALAMIN) 1000 MCG tablet Take 2,000 mcg by mouth daily.     Allergies:   Duloxetine hcl   Social History   Socioeconomic History   Marital status: Married    Spouse name: Not  on file   Number of children: Not on file   Years of education: Not on file   Highest education level: Not on file  Occupational History   Not on file  Tobacco Use   Smoking status: Never   Smokeless tobacco: Never  Vaping Use   Vaping Use: Never used  Substance and Sexual Activity   Alcohol use: No    Alcohol/week: 0.0 standard drinks   Drug use: No   Sexual activity: Not on file  Other Topics Concern   Not on file  Social History Narrative   Not on file   Social Determinants of Health   Financial Resource Strain: Not on file  Food Insecurity: No Food Insecurity   Worried About Running Out of Food in the Last Year: Never true   [place] in the Last Year: Never true  Transportation Needs: No Transportation Needs   Lack of Transportation (Medical): No   Lack of Transportation (Non-Medical): No  Physical Activity: Not on file  Stress: Not on file  Social Connections: Not on file     Family History: The patient's family history includes Cancer in his mother; Dementia in his sister; Diabetes in his father and sister; Heart disease in his father and sister; Obesity in his sister; Other in his father and sister; Stroke in his father.  ROS:   Please see the history of present illness.    Denies orthopnea after the procedure.  No lower extremity edema.  Overall he feels well.  All other systems reviewed and are negative.  EKGs/Labs/Other Studies Reviewed:    The following studies were reviewed today:  Bilateral Carotid Doppler 11/25/2020: Summary:  Right Carotid: Velocities in the right ICA are consistent with a 40-59%                 stenosis.   Left Carotid: Velocities in the left ICA are consistent with a 40-59%  stenosis.                Non-hemodynamically significant plaque <50% noted in the  CCA.   Vertebrals:  Bilateral vertebral arteries demonstrate antegrade flow.  Subclavians: Normal flow hemodynamics were seen in bilateral subclavian                arteries.   *See table(s) above for measurements and observations.  Suggest follow up study in 12 months.   EKG:  EKG sinus rhythm with PACs.  Prominent voltage.  Nonspecific ST abnormality.  Compared to June 2021, PACs are new.  Otherwise unchanged.  Recent Labs: No results found for requested labs within last 8760 hours.  Recent Lipid Panel No results found for: CHOL, TRIG, HDL, CHOLHDL, VLDL, LDLCALC, LDLDIRECT  Physical Exam:    VS:  Ht 5\' 9"  (1.753  m)   Wt 145 lb (65.8 kg)   BMI 21.41 kg/m     Wt Readings from Last 3 Encounters:  12/01/20 145 lb (65.8 kg)  03/08/20 139 lb (63 kg)  11/06/19 139 lb 1.9 oz (63.1 kg)     GEN: [place], healthy-appearing. No acute distress HEENT: Normal NECK: No JVD. LYMPHATICS: No lymphadenopathy CARDIAC: 3/6 late peaking systolic apical murmur. RRR no gallop, or edema. VASCULAR:  Normal Pulses. No bruits. RESPIRATORY:  Clear to auscultation without rales, wheezing or rhonchi  ABDOMEN: Soft, non-tender, non-distended, No pulsatile mass, MUSCULOSKELETAL: No deformity  SKIN: Warm and dry NEUROLOGIC:  Alert and oriented x 3 PSYCHIATRIC:  Normal affect   ASSESSMENT:    1. S/P mitral valve clip implantation   2. Bilateral carotid artery stenosis   3. Chronic diastolic CHF (congestive heart failure) ([place])   4. Mixed hyperlipidemia   5. Essential hypertension    PLAN:    In order of problems listed above:  Await official reading of the echocardiogram.  Still has significant murmur.  No evidence of volume overload. Fortunately, he is clinically doing well with NYHA class I symptoms.  Carotids are stable. No clinical evidence of volume overload. Continue Pravachol. Blood pressure is well controlled    Clinical follow-up in 1 year.   Medication Adjustments/Labs and Tests Ordered: Current medicines are reviewed at length with the patient today.  Concerns regarding medicines are outlined above.  Orders Placed This Encounter   Procedures   EKG 12-Lead   No orders of the defined types were placed in this encounter.   Patient Instructions  Medication Instructions:  Your physician recommends that you continue on your current medications as directed. Please refer to the Current Medication list given to you today.  *If you need a refill on your cardiac medications before your next appointment, please call your pharmacy*   Lab Work: None If you have labs (blood work) drawn today and your tests are completely normal, you will receive your results only by: [place] (if you have MyChart) OR A paper copy in the mail If you have any lab test that is abnormal or we need to change your treatment, we will call you to review the results.   Testing/Procedures: None   Follow-Up: At [facility], you and your health needs are our priority.  As part of our continuing mission to provide you with exceptional heart care, we have created designated Provider Care Teams.  These Care Teams include your primary Cardiologist (physician) and Advanced Practice Providers (APPs -  Physician Assistants and Nurse Practitioners) who all work together to provide you with the care you need, when you need it.  We recommend signing up for the patient portal called "MyChart".  Sign up information is provided on this After Visit Summary.  MyChart is used to connect with patients for Virtual Visits (Telemedicine).  Patients are able to view lab/test results, encounter notes, upcoming appointments, etc.  Non-urgent messages can be sent to your provider as well.   To learn more about what you can do with MyChart, go to [email or web address].    Your next appointment:   6 month(s)  The format for your next appointment:   In Person  Provider:   You may see [name], MD or one of the following Advanced Practice Providers on your designated Care Team:   Cecilie Kicks, NP    Other Instructions     Signed, [name], MD  12/01/2020 4:06 PM    [facility]

## 2020-12-01 ENCOUNTER — Other Ambulatory Visit: Payer: Self-pay

## 2020-12-01 ENCOUNTER — Ambulatory Visit: Payer: Medicare HMO | Admitting: Interventional Cardiology

## 2020-12-01 ENCOUNTER — Encounter: Payer: Self-pay | Admitting: Interventional Cardiology

## 2020-12-01 ENCOUNTER — Other Ambulatory Visit: Payer: Self-pay | Admitting: *Deleted

## 2020-12-01 ENCOUNTER — Ambulatory Visit (HOSPITAL_COMMUNITY): Payer: Medicare HMO | Attending: Cardiology

## 2020-12-01 VITALS — BP 128/68 | HR 69 | Ht 69.0 in | Wt 145.0 lb

## 2020-12-01 DIAGNOSIS — I6523 Occlusion and stenosis of bilateral carotid arteries: Secondary | ICD-10-CM | POA: Diagnosis not present

## 2020-12-01 DIAGNOSIS — Z9889 Other specified postprocedural states: Secondary | ICD-10-CM

## 2020-12-01 DIAGNOSIS — I5032 Chronic diastolic (congestive) heart failure: Secondary | ICD-10-CM

## 2020-12-01 DIAGNOSIS — E119 Type 2 diabetes mellitus without complications: Secondary | ICD-10-CM | POA: Insufficient documentation

## 2020-12-01 DIAGNOSIS — I10 Essential (primary) hypertension: Secondary | ICD-10-CM

## 2020-12-01 DIAGNOSIS — I083 Combined rheumatic disorders of mitral, aortic and tricuspid valves: Secondary | ICD-10-CM | POA: Diagnosis not present

## 2020-12-01 DIAGNOSIS — Z954 Presence of other heart-valve replacement: Secondary | ICD-10-CM | POA: Diagnosis not present

## 2020-12-01 DIAGNOSIS — Z95818 Presence of other cardiac implants and grafts: Secondary | ICD-10-CM | POA: Insufficient documentation

## 2020-12-01 DIAGNOSIS — E782 Mixed hyperlipidemia: Secondary | ICD-10-CM

## 2020-12-01 DIAGNOSIS — E785 Hyperlipidemia, unspecified: Secondary | ICD-10-CM | POA: Diagnosis not present

## 2020-12-01 LAB — ECHOCARDIOGRAM COMPLETE
Area-P 1/2: 1.74 cm2
MV M vel: 4.33 m/s
MV Peak grad: 75 mmHg
MV VTI: 1.01 cm2
P 1/2 time: 526 msec
S' Lateral: 4.1 cm

## 2020-12-01 NOTE — Patient Instructions (Signed)
Medication Instructions:  Your physician recommends that you continue on your current medications as directed. Please refer to the Current Medication list given to you today.  *If you need a refill on your cardiac medications before your next appointment, please call your pharmacy*   Lab Work: None If you have labs (blood work) drawn today and your tests are completely normal, you will receive your results only by: MyChart Message (if you have MyChart) OR A paper copy in the mail If you have any lab test that is abnormal or we need to change your treatment, we will call you to review the results.   Testing/Procedures: None   Follow-Up: At [facility], you and your health needs are our priority.  As part of our continuing mission to provide you with exceptional heart care, we have created designated Provider Care Teams.  These Care Teams include your primary Cardiologist (physician) and Advanced Practice Providers (APPs -  Physician Assistants and Nurse Practitioners) who all work together to provide you with the care you need, when you need it.  We recommend signing up for the patient portal called "MyChart".  Sign up information is provided on this After Visit Summary.  MyChart is used to connect with patients for Virtual Visits (Telemedicine).  Patients are able to view lab/test results, encounter notes, upcoming appointments, etc.  Non-urgent messages can be sent to your provider as well.   To learn more about what you can do with MyChart, go to [email or web address].    Your next appointment:   6 month(s)  The format for your next appointment:   In Person  Provider:   You may see Henry W Smith III, MD or one of the following Advanced Practice Providers on your designated Care Team:   Laura Ingold, NP   Other Instructions   

## 2021-01-13 DIAGNOSIS — D649 Anemia, unspecified: Secondary | ICD-10-CM | POA: Diagnosis not present

## 2021-01-13 DIAGNOSIS — E039 Hypothyroidism, unspecified: Secondary | ICD-10-CM | POA: Diagnosis not present

## 2021-01-13 DIAGNOSIS — E7800 Pure hypercholesterolemia, unspecified: Secondary | ICD-10-CM | POA: Diagnosis not present

## 2021-01-13 DIAGNOSIS — R739 Hyperglycemia, unspecified: Secondary | ICD-10-CM | POA: Diagnosis not present

## 2021-01-18 DIAGNOSIS — E782 Mixed hyperlipidemia: Secondary | ICD-10-CM | POA: Diagnosis not present

## 2021-01-18 DIAGNOSIS — I5032 Chronic diastolic (congestive) heart failure: Secondary | ICD-10-CM | POA: Diagnosis not present

## 2021-01-18 DIAGNOSIS — D692 Other nonthrombocytopenic purpura: Secondary | ICD-10-CM | POA: Diagnosis not present

## 2021-01-18 DIAGNOSIS — Z0000 Encounter for general adult medical examination without abnormal findings: Secondary | ICD-10-CM | POA: Diagnosis not present

## 2021-01-18 DIAGNOSIS — R69 Illness, unspecified: Secondary | ICD-10-CM | POA: Diagnosis not present

## 2021-01-18 DIAGNOSIS — I10 Essential (primary) hypertension: Secondary | ICD-10-CM | POA: Diagnosis not present

## 2021-01-18 DIAGNOSIS — E039 Hypothyroidism, unspecified: Secondary | ICD-10-CM | POA: Diagnosis not present

## 2021-01-18 DIAGNOSIS — R7303 Prediabetes: Secondary | ICD-10-CM | POA: Diagnosis not present

## 2021-01-18 DIAGNOSIS — N1831 Chronic kidney disease, stage 3a: Secondary | ICD-10-CM | POA: Diagnosis not present

## 2021-02-04 DIAGNOSIS — R351 Nocturia: Secondary | ICD-10-CM | POA: Diagnosis not present

## 2021-02-04 DIAGNOSIS — N401 Enlarged prostate with lower urinary tract symptoms: Secondary | ICD-10-CM | POA: Diagnosis not present

## 2021-02-04 DIAGNOSIS — R972 Elevated prostate specific antigen [PSA]: Secondary | ICD-10-CM | POA: Diagnosis not present

## 2021-02-18 DIAGNOSIS — Z23 Encounter for immunization: Secondary | ICD-10-CM | POA: Diagnosis not present

## 2021-03-01 DIAGNOSIS — E119 Type 2 diabetes mellitus without complications: Secondary | ICD-10-CM | POA: Diagnosis not present

## 2021-03-01 DIAGNOSIS — H524 Presbyopia: Secondary | ICD-10-CM | POA: Diagnosis not present

## 2021-03-03 DIAGNOSIS — I5032 Chronic diastolic (congestive) heart failure: Secondary | ICD-10-CM | POA: Diagnosis not present

## 2021-03-03 DIAGNOSIS — N1831 Chronic kidney disease, stage 3a: Secondary | ICD-10-CM | POA: Diagnosis not present

## 2021-03-03 DIAGNOSIS — I10 Essential (primary) hypertension: Secondary | ICD-10-CM | POA: Diagnosis not present

## 2021-03-03 DIAGNOSIS — D692 Other nonthrombocytopenic purpura: Secondary | ICD-10-CM | POA: Diagnosis not present

## 2021-03-03 DIAGNOSIS — I340 Nonrheumatic mitral (valve) insufficiency: Secondary | ICD-10-CM | POA: Diagnosis not present

## 2021-03-03 DIAGNOSIS — R7303 Prediabetes: Secondary | ICD-10-CM | POA: Diagnosis not present

## 2021-03-03 DIAGNOSIS — R69 Illness, unspecified: Secondary | ICD-10-CM | POA: Diagnosis not present

## 2021-03-03 DIAGNOSIS — E039 Hypothyroidism, unspecified: Secondary | ICD-10-CM | POA: Diagnosis not present

## 2021-03-03 DIAGNOSIS — E782 Mixed hyperlipidemia: Secondary | ICD-10-CM | POA: Diagnosis not present

## 2021-04-11 ENCOUNTER — Ambulatory Visit (INDEPENDENT_AMBULATORY_CARE_PROVIDER_SITE_OTHER): Payer: Medicare HMO

## 2021-04-11 ENCOUNTER — Ambulatory Visit: Payer: Medicare HMO | Admitting: Physician Assistant

## 2021-04-11 ENCOUNTER — Encounter: Payer: Self-pay | Admitting: Physician Assistant

## 2021-04-11 ENCOUNTER — Other Ambulatory Visit: Payer: Self-pay

## 2021-04-11 VITALS — BP 110/57 | HR 71 | Ht 69.0 in | Wt 145.6 lb

## 2021-04-11 DIAGNOSIS — R071 Chest pain on breathing: Secondary | ICD-10-CM | POA: Diagnosis not present

## 2021-04-11 DIAGNOSIS — W19XXXA Unspecified fall, initial encounter: Secondary | ICD-10-CM

## 2021-04-11 DIAGNOSIS — I493 Ventricular premature depolarization: Secondary | ICD-10-CM

## 2021-04-11 DIAGNOSIS — I5032 Chronic diastolic (congestive) heart failure: Secondary | ICD-10-CM

## 2021-04-11 DIAGNOSIS — Z95818 Presence of other cardiac implants and grafts: Secondary | ICD-10-CM

## 2021-04-11 DIAGNOSIS — Z9889 Other specified postprocedural states: Secondary | ICD-10-CM | POA: Diagnosis not present

## 2021-04-11 DIAGNOSIS — R0602 Shortness of breath: Secondary | ICD-10-CM | POA: Diagnosis not present

## 2021-04-11 DIAGNOSIS — R072 Precordial pain: Secondary | ICD-10-CM | POA: Diagnosis not present

## 2021-04-11 NOTE — Progress Notes (Unsigned)
Enrolled for [employer] to mail a ZIO XT long term holter monitor to the patients address on file.  

## 2021-04-11 NOTE — Patient Instructions (Addendum)
Medication Instructions:  Your physician has recommended you make the following change in your medication:   STOP Hydrochlorothiazide    *If you need a refill on your cardiac medications before your next appointment, please call your pharmacy*   Lab Work: TODAY:  BMP & MAG  If you have labs (blood work) drawn today and your tests are completely normal, you will receive your results only by: [place] (if you have MyChart) OR A paper copy in the mail If you have any lab test that is abnormal or we need to change your treatment, we will call you to review the results.   Testing/Procedures: [place]- Long Term Monitor Instructions  Your physician has requested you wear a ZIO patch monitor for 3 days.  This is a single patch monitor. [employer] supplies one patch monitor per enrollment. Additional stickers are not available. Please do not apply patch if you will be having a Nuclear Stress Test,  Echocardiogram, Cardiac CT, MRI, or Chest Xray during the period you would be wearing the  monitor. The patch cannot be worn during these tests. You cannot remove and re-apply the  ZIO XT patch monitor.  Your ZIO patch monitor will be mailed 3 day USPS to your address on file. It may take 3-5 days  to receive your monitor after you have been enrolled.  Once you have received your monitor, please review the enclosed instructions. Your monitor  has already been registered assigning a specific monitor serial # to you.  Billing and Patient Assistance Program Information  We have supplied [employer] with any of your insurance information on file for billing purposes. [employer] offers a sliding scale Patient Assistance Program for patients that do not have  insurance, or whose insurance does not completely cover the cost of the ZIO monitor.  You must apply for the Patient Assistance Program to qualify for this discounted rate.  To apply, please call [employer] at [phone], select option 4, select  option 2, ask to apply for  Patient Assistance Program. Theodore Demark will ask your household income, and how many people  are in your household. They will quote your out-of-pocket cost based on that information.  [employer] will also be able to set up a 49-month, interest-free payment plan if needed.  Applying the monitor   Shave hair from upper left chest.  Hold abrader disc by orange tab. Rub abrader in 40 strokes over the upper left chest as  indicated in your monitor instructions.  Clean area with 4 enclosed alcohol pads. Let dry.  Apply patch as indicated in monitor instructions. Patch will be placed under collarbone on left  side of chest with arrow pointing upward.  Rub patch adhesive wings for 2 minutes. Remove white label marked "1". Remove the white  label marked "2". Rub patch adhesive wings for 2 additional minutes.  While looking in a mirror, press and release button in center of patch. A small green light will  flash 3-4 times. This will be your only indicator that the monitor has been turned on.  Do not shower for the first 24 hours. You may shower after the first 24 hours.  Press the button if you feel a symptom. You will hear a small click. Record Date, Time and  Symptom in the Patient Logbook.  When you are ready to remove the patch, follow instructions on the last 2 pages of Patient  Logbook. Stick patch monitor onto the last page of Patient Logbook.  Place Patient Logbook in  the blue and white box. Use locking tab on box and tape box closed  securely. The blue and white box has prepaid postage on it. Please place it in the mailbox as  soon as possible. Your physician should have your test results approximately 7 days after the  monitor has been mailed back to [facility].  Call [place] at [phone] if you have questions regarding  your ZIO XT patch monitor. Call them immediately if you see an orange light blinking on your  monitor.  If your monitor  falls off in less than 4 days, contact our Monitor department at [phone].  If your monitor becomes loose or falls off after 4 days call [employer] at [phone] for  suggestions on securing your monitor    Follow-Up: At [facility], you and your health needs are our priority.  As part of our continuing mission to provide you with exceptional heart care, we have created designated Provider Care Teams.  These Care Teams include your primary Cardiologist (physician) and Advanced Practice Providers (APPs -  Physician Assistants and Nurse Practitioners) who all work together to provide you with the care you need, when you need it.  We recommend signing up for the patient portal called "MyChart".  Sign up information is provided on this After Visit Summary.  MyChart is used to connect with patients for Virtual Visits (Telemedicine).  Patients are able to view lab/test results, encounter notes, upcoming appointments, etc.  Non-urgent messages can be sent to your provider as well.   To learn more about what you can do with MyChart, go to [email or web address].    Your next appointment:   08/01/2021 ARRIVE AT 11:25  The format for your next appointment:   In Person  Provider:   [name], MD     Other Instructions

## 2021-04-11 NOTE — Progress Notes (Signed)
Cardiology Office Note:    Date:  04/11/2021   ID:  Adrian Olson, DOB 05/07/1931, MRN [id number]  PCP:  Merrilee Seashore, MD  [facility] Cardiologist:  [name], MD  [facility] Electrophysiologist:  None   Chief Complaint: 3 months follow up   History of Present Illness:    Adrian Olson is a 87 y.o. male with a hx of HTN, HLD,  and DM anxiety/depression and mitral valve prolapse and mitral regurgitation s/p TEER (10/09/19) presents for abnormal EKG.  Patient underwent successful transcatheter edge to edge repair using 2 MitraClip XTW devices, first device positioned A2 P2, second device positioned A1 P1. Post op echo showed EF 60-65%, mod pulm HTN, stable mitral valve repair with mild to moderate residual MR, mean gradient 5.5 mmHg,  Last echo 11/2020 showed LVEF of 55-60%. RVSP is 62.8 mm Hg. Moderate to severe mitral valve regurgitation.  Moderate mitral stenosis. The mean mitral valve gradient is 6.5 mmHg with average heart rate of 65 bpm  Carotid doppler 40-59% stenosis bilaterally.   Patient is added for acute visit after being seen by PCP this morning.  On Thanksgiving day, patient was sitting at the edge of the bed and fall backward without prodromal symptoms.  No loss of consciousness.  For the past month or so patient is feeling intermittent dizziness only upon standing.  Denies chest pain, shortness of breath, palpitation, fluttering sensation, orthopnea, PND or syncope.  Seen by PCP this morning with EKG showing PVC and ventricular trigeminy (personally reviewed)>> added to my schedule for further evaluation.  Has pending chest x-ray.  Plan was to get blood work done but came here directly.  Patient is also dealing with balance issue.  Past Medical History:  Diagnosis Date   Bilateral carotid artery disease (HCC)    Severe Left and moderate Right   Diabetes mellitus without complication [facility])    ED (erectile dysfunction)    Hyperlipidemia     Hypertension    Lumbar disc disease    Mitral valve prolapse    moderate to severe MR, LVEF 55% Echo 2010   Moderate mitral regurgitation 03/16/2014   S/P mitral valve clip implantation 10/09/2019   s/p TEER using 2 MitraClip XTW devices, first device positioned A2 P2, second device positioned A1 P1 by Dr. Burt Knack    Past Surgical History:  Procedure Laterality Date   CARDIAC CATHETERIZATION     MITRAL VALVE REPAIR  10/09/2019   MITRAL VALVE REPAIR (N/A )   MITRAL VALVE REPAIR N/A 10/09/2019   Procedure: MITRAL VALVE REPAIR;  Surgeon: Sherren Mocha, MD;  Location: [place] [facility];  Service: Cardiovascular;  Laterality: N/A;   RIGHT/LEFT HEART CATH AND CORONARY ANGIOGRAPHY N/A 09/16/2019   Procedure: RIGHT/LEFT HEART CATH AND CORONARY ANGIOGRAPHY;  Surgeon: Belva Crome, MD;  Location: [place] [facility];  Service: Cardiovascular;  Laterality: N/A;   TEE WITHOUT CARDIOVERSION N/A 09/16/2019   Procedure: TRANSESOPHAGEAL ECHOCARDIOGRAM (TEE);  Surgeon: Lelon Perla, MD;  Location: [facility] ENDOS[facility];  Service: Cardiovascular;  Laterality: N/A;    Current Medications: Current Meds  Medication Sig   acetaminophen (TYLENOL) 500 MG tablet Take 1,000 mg by mouth every 6 (six) hours as needed (for pain.).   amoxicillin (AMOXIL) 500 MG tablet Take 4 capsules (2,000 mg) one hour prior to all dental visits.   aspirin 81 MG EC tablet Take 81 mg by mouth daily.   chlordiazePOXIDE (LIBRIUM) 10 MG capsule Take 1 tablet by mouth  daily.   Cholecalciferol (VITAMIN D) 2000 UNITS tablet Take 2,000 Units by mouth daily at 12 noon.    Cyanocobalamin (VITAMIN B-12) 2000 MCG TBCR 1 tablet   finasteride (PROSCAR) 5 MG tablet Take 5 mg by mouth daily at 12 noon.    levothyroxine (SYNTHROID, LEVOTHROID) 75 MCG tablet Take 75 mcg by mouth daily before breakfast.   metFORMIN (GLUCOPHAGE) 500 MG tablet Take 500 mg by mouth every evening.    pravastatin (PRAVACHOL) 20 MG tablet Take 20 mg by mouth at bedtime.     psyllium (METAMUCIL) 58.6 % packet Take 1 packet by mouth daily.   ramipril (ALTACE) 5 MG capsule Take 5 mg by mouth daily.    sodium chloride (OCEAN) 0.65 % SOLN nasal spray Place 1 spray into both nostrils daily as needed for congestion.   tamsulosin (FLOMAX) 0.4 MG CAPS capsule Take 0.4 mg by mouth daily at 12 noon.    Thiamine HCl (VITAMIN B1 PO) Take 125 mcg by mouth daily.   [DISCONTINUED] hydrochlorothiazide (MICROZIDE) 12.5 MG capsule Take 12.5 mg by mouth daily.     Allergies:   Duloxetine hcl   Social History   Socioeconomic History   Marital status: Married    Spouse name: Not on file   Number of children: Not on file   Years of education: Not on file   Highest education level: Not on file  Occupational History   Not on file  Tobacco Use   Smoking status: Never   Smokeless tobacco: Never  Vaping Use   Vaping Use: Never used  Substance and Sexual Activity   Alcohol use: No    Alcohol/week: 0.0 standard drinks   Drug use: No   Sexual activity: Not on file  Other Topics Concern   Not on file  Social History Narrative   Not on file   Social Determinants of Health   Financial Resource Strain: Not on file  Food Insecurity: Not on file  Transportation Needs: Not on file  Physical Activity: Not on file  Stress: Not on file  Social Connections: Not on file     Family History: The patient's family history includes Cancer in his mother; Dementia in his sister; Diabetes in his father and sister; Heart disease in his father and sister; Obesity in his sister; Other in his father and sister; Stroke in his father.    ROS:   Please see the history of present illness.    All other systems reviewed and are negative.   EKGs/Labs/Other Studies Reviewed:    The following studies were reviewed today:  Echo 11/2020 1. Left ventricular ejection fraction, by estimation, is 55 to 60%. The  left ventricle has normal function. The left ventricle has no regional  wall motion  abnormalities. Left ventricular diastolic function could not  be evaluated.   2. Right ventricular systolic function is normal. The right ventricular  size is mildly enlarged. There is severely elevated pulmonary artery  systolic pressure. The estimated right ventricular systolic pressure is  87.8 mmHg.   3. Left atrial size was severely dilated.   4. Right atrial size was moderately dilated.   5. At least two discrete MR jets seen. In general MR s/p 2 clips appears  moderate, but on image 72, one of the jets appears likely severe due to  Coanda effect.. The mitral valve has been repaired/replaced. Moderate to  severe mitral valve regurgitation.  Moderate mitral stenosis. The mean mitral valve gradient is 6.5 mmHg with  average heart rate of 65 bpm. There is a Mitra-Clip present in the mitral  position. Procedure Date: 10/09/2019.   6. Tricuspid valve regurgitation is moderate to severe.   7. The aortic valve is tricuspid. There is mild calcification of the  aortic valve. There is mild thickening of the aortic valve. Aortic valve  regurgitation is mild to moderate. Aortic regurgitation PHT measures 526  msec.   8. The inferior vena cava is dilated in size with >50% respiratory  variability, suggesting right atrial pressure of 8 mmHg.   Comparison(s): Prior images reviewed side by side.   EKG:  EKG is bot  ordered today.  The ekg done at PCP office today showed sinus rhythm, PVC and ventricular trigeminy  Recent Labs: No results found for requested labs within last 8760 hours.  Recent Lipid Panel No results found for: CHOL, TRIG, HDL, CHOLHDL, VLDL, LDLCALC, LDLDIRECT  Physical Exam:    VS:  BP (!) 110/57 (BP Location: Right Arm, Patient Position: Standing)   Pulse 71   Ht 5\' 9"  (1.753 m)   Wt 145 lb 9.6 oz (66 kg)   SpO2 97%   BMI 21.50 kg/m     Wt Readings from Last 3 Encounters:  04/11/21 145 lb 9.6 oz (66 kg)  12/01/20 145 lb (65.8 kg)  03/08/20 139 lb (63 kg)      GEN:  Well nourished, well developed in no acute distress HEENT: Normal NECK: No JVD; No carotid bruits LYMPHATICS: No lymphadenopathy CARDIAC: Irregular, + murmurs, rubs, gallops RESPIRATORY:  Clear to auscultation without rales, wheezing or rhonchi  ABDOMEN: Soft, non-tender, non-distended MUSCULOSKELETAL:  No edema; No deformity  SKIN: Warm and dry NEUROLOGIC:  Alert and oriented x 3 PSYCHIATRIC:  Normal affect   ASSESSMENT AND PLAN:    Orthostatic hypotension Patient is orthostatic by vital.  This explains his dizziness upon standing.  I have recommended balance before movement.  Try compression stocking.  Discontinued hydrochlorothiazide.  If still ongoing symptoms he needs to discuss stopping Flomax with PCP.  He had stable carotid artery disease by study July 2022.  2.  PVC/ventricular trigeminy -Patient has known mitral valve disease.  I do not think PVC causing him to have dizziness.  -Get 3 days of Zio patch -Check electrolytes  3.  Fall Patient fell from edge of bed without prodromal symptoms.  No loss of consciousness.  He also reports balance issue.  Does not suspect arrhythmia causing him to have fall.  Get monitor and orthostatics corrected as above.  Recommended to discuss with PCP about PT/OT.    4.  Mitral regurgitation s/p MitraClip - ast echo 11/2020 showed LVEF of 55-60%. RVSP is 62.8 mm Hg. Moderate to severe mitral valve regurgitation.  Moderate mitral stenosis. The mean mitral valve gradient is 6.5 mmHg with average heart rate of 65 bpm -Follow-up with routine echocardiogram  Plan reviewed and discussed with Dr. Tamala Julian personally.  Medication Adjustments/Labs and Tests Ordered: Current medicines are reviewed at length with the patient today.  Concerns regarding medicines are outlined above.  Orders Placed This Encounter  Procedures   Basic Metabolic Panel (BMET)   Magnesium   LONG TERM MONITOR (3-14 DAYS)   No orders of the defined types were placed  in this encounter.   Patient Instructions  Medication Instructions:  Your physician has recommended you make the following change in your medication:   STOP Hydrochlorothiazide    *If you need a refill on your cardiac medications before your next  appointment, please call your pharmacy*   Lab Work: TODAY:  BMP & MAG  If you have labs (blood work) drawn today and your tests are completely normal, you will receive your results only by: [place] (if you have MyChart) OR A paper copy in the mail If you have any lab test that is abnormal or we need to change your treatment, we will call you to review the results.   Testing/Procedures: [place] [place]- Long Term Monitor Instructions  Your physician has requested you wear a ZIO patch monitor for 3 days.  This is a single patch monitor. [employer] supplies one patch monitor per enrollment. Additional stickers are not available. Please do not apply patch if you will be having a Nuclear Stress Test,  Echocardiogram, Cardiac CT, MRI, or Chest Xray during the period you would be wearing the  monitor. The patch cannot be worn during these tests. You cannot remove and re-apply the  ZIO XT patch monitor.  Your ZIO patch monitor will be mailed 3 day USPS to your address on file. It may take 3-5 days  to receive your monitor after you have been enrolled.  Once you have received your monitor, please review the enclosed instructions. Your monitor  has already been registered assigning a specific monitor serial # to you.  Billing and Patient Assistance Program Information  We have supplied [employer] with any of your insurance information on file for billing purposes. [employer] offers a sliding scale Patient Assistance Program for patients that do not have  insurance, or whose insurance does not completely cover the cost of the ZIO monitor.  You must apply for the Patient Assistance Program to qualify for this discounted rate.  To apply, please call  [employer] at [phone], select option 4, select option 2, ask to apply for  Patient Assistance Program. Theodore Demark will ask your household income, and how many people  are in your household. They will quote your out-of-pocket cost based on that information.  [employer] will also be able to set up a 71-month, interest-free payment plan if needed.  Applying the monitor   Shave hair from upper left chest.  Hold abrader disc by orange tab. Rub abrader in 40 strokes over the upper left chest as  indicated in your monitor instructions.  Clean area with 4 enclosed alcohol pads. Let dry.  Apply patch as indicated in monitor instructions. Patch will be placed under collarbone on left  side of chest with arrow pointing upward.  Rub patch adhesive wings for 2 minutes. Remove white label marked "1". Remove the white  label marked "2". Rub patch adhesive wings for 2 additional minutes.  While looking in a mirror, press and release button in center of patch. A small green light will  flash 3-4 times. This will be your only indicator that the monitor has been turned on.  Do not shower for the first 24 hours. You may shower after the first 24 hours.  Press the button if you feel a symptom. You will hear a small click. Record Date, Time and  Symptom in the Patient Logbook.  When you are ready to remove the patch, follow instructions on the last 2 pages of Patient  Logbook. Stick patch monitor onto the last page of Patient Logbook.  Place Patient Logbook in the blue and white box. Use locking tab on box and tape box closed  securely. The blue and white box has prepaid postage on it. Please place it in the mailbox as  soon as possible. Your physician should have your test results approximately 7 days after the  monitor has been mailed back to [facility].  Call [place] at [phone] if you have questions regarding  your ZIO XT patch monitor. Call them immediately if you see an orange  light blinking on your  monitor.  If your monitor falls off in less than 4 days, contact our Monitor department at [phone].  If your monitor becomes loose or falls off after 4 days call [employer] at [phone] for  suggestions on securing your monitor    Follow-Up: At [facility], you and your health needs are our priority.  As part of our continuing mission to provide you with exceptional heart care, we have created designated Provider Care Teams.  These Care Teams include your primary Cardiologist (physician) and Advanced Practice Providers (APPs -  Physician Assistants and Nurse Practitioners) who all work together to provide you with the care you need, when you need it.  We recommend signing up for the patient portal called "MyChart".  Sign up information is provided on this After Visit Summary.  MyChart is used to connect with patients for Virtual Visits (Telemedicine).  Patients are able to view lab/test results, encounter notes, upcoming appointments, etc.  Non-urgent messages can be sent to your provider as well.   To learn more about what you can do with MyChart, go to [email or web address].    Your next appointment:   08/01/2021 ARRIVE AT 11:25  The format for your next appointment:   In Person  Provider:   [name], MD     Other Instructions    Signed, Leanor Kail, PA  04/11/2021 3:02 PM    [place]

## 2021-04-12 LAB — BASIC METABOLIC PANEL
BUN/Creatinine Ratio: 18 (ref 10–24)
BUN: 21 mg/dL (ref 8–27)
CO2: 23 mmol/L (ref 20–29)
Calcium: 9.5 mg/dL (ref 8.6–10.2)
Chloride: 87 mmol/L — ABNORMAL LOW (ref 96–106)
Creatinine, Ser: 1.19 mg/dL (ref 0.76–1.27)
Glucose: 145 mg/dL — ABNORMAL HIGH (ref 70–99)
Potassium: 4.4 mmol/L (ref 3.5–5.2)
Sodium: 126 mmol/L — ABNORMAL LOW (ref 134–144)
eGFR: 59 mL/min/{1.73_m2} — ABNORMAL LOW (ref >59)

## 2021-04-12 LAB — MAGNESIUM: Magnesium: 1.8 mg/dL (ref 1.6–2.3)

## 2021-04-12 NOTE — Progress Notes (Signed)
Pt has been made aware of normal result and verbalized understanding.  jw

## 2021-04-14 DIAGNOSIS — Z9889 Other specified postprocedural states: Secondary | ICD-10-CM

## 2021-04-14 DIAGNOSIS — I5032 Chronic diastolic (congestive) heart failure: Secondary | ICD-10-CM | POA: Diagnosis not present

## 2021-04-14 DIAGNOSIS — Z95818 Presence of other cardiac implants and grafts: Secondary | ICD-10-CM

## 2021-04-14 DIAGNOSIS — I493 Ventricular premature depolarization: Secondary | ICD-10-CM

## 2021-04-18 DIAGNOSIS — R918 Other nonspecific abnormal finding of lung field: Secondary | ICD-10-CM | POA: Diagnosis not present

## 2021-04-18 DIAGNOSIS — S2241XD Multiple fractures of ribs, right side, subsequent encounter for fracture with routine healing: Secondary | ICD-10-CM | POA: Diagnosis not present

## 2021-04-21 ENCOUNTER — Other Ambulatory Visit: Payer: Self-pay | Admitting: Internal Medicine

## 2021-04-21 DIAGNOSIS — J90 Pleural effusion, not elsewhere classified: Secondary | ICD-10-CM

## 2021-04-24 ENCOUNTER — Emergency Department (HOSPITAL_COMMUNITY): Payer: Medicare HMO

## 2021-04-24 ENCOUNTER — Inpatient Hospital Stay (HOSPITAL_COMMUNITY)
Admission: EM | Admit: 2021-04-24 | Discharge: 2021-04-26 | DRG: 186 | Disposition: A | Discharge status: Home Health | Payer: Medicare HMO | Attending: Internal Medicine | Admitting: Internal Medicine

## 2021-04-24 ENCOUNTER — Inpatient Hospital Stay (HOSPITAL_COMMUNITY): Payer: Medicare HMO

## 2021-04-24 ENCOUNTER — Encounter (HOSPITAL_COMMUNITY): Payer: Self-pay | Admitting: Emergency Medicine

## 2021-04-24 ENCOUNTER — Other Ambulatory Visit: Payer: Self-pay

## 2021-04-24 DIAGNOSIS — R9431 Abnormal electrocardiogram [ECG] [EKG]: Secondary | ICD-10-CM | POA: Diagnosis not present

## 2021-04-24 DIAGNOSIS — S2241XA Multiple fractures of ribs, right side, initial encounter for closed fracture: Secondary | ICD-10-CM | POA: Diagnosis not present

## 2021-04-24 DIAGNOSIS — I10 Essential (primary) hypertension: Secondary | ICD-10-CM | POA: Diagnosis present

## 2021-04-24 DIAGNOSIS — E1122 Type 2 diabetes mellitus with diabetic chronic kidney disease: Secondary | ICD-10-CM | POA: Diagnosis present

## 2021-04-24 DIAGNOSIS — I700 Atherosclerosis of aorta: Secondary | ICD-10-CM | POA: Diagnosis not present

## 2021-04-24 DIAGNOSIS — E871 Hypo-osmolality and hyponatremia: Secondary | ICD-10-CM | POA: Diagnosis present

## 2021-04-24 DIAGNOSIS — I130 Hypertensive heart and chronic kidney disease with heart failure and stage 1 through stage 4 chronic kidney disease, or unspecified chronic kidney disease: Secondary | ICD-10-CM | POA: Diagnosis not present

## 2021-04-24 DIAGNOSIS — Z888 Allergy status to other drugs, medicaments and biological substances status: Secondary | ICD-10-CM | POA: Diagnosis not present

## 2021-04-24 DIAGNOSIS — I499 Cardiac arrhythmia, unspecified: Secondary | ICD-10-CM | POA: Diagnosis not present

## 2021-04-24 DIAGNOSIS — Z833 Family history of diabetes mellitus: Secondary | ICD-10-CM

## 2021-04-24 DIAGNOSIS — I517 Cardiomegaly: Secondary | ICD-10-CM | POA: Diagnosis not present

## 2021-04-24 DIAGNOSIS — J9601 Acute respiratory failure with hypoxia: Secondary | ICD-10-CM | POA: Diagnosis not present

## 2021-04-24 DIAGNOSIS — Z79890 Hormone replacement therapy (postmenopausal): Secondary | ICD-10-CM | POA: Diagnosis not present

## 2021-04-24 DIAGNOSIS — E119 Type 2 diabetes mellitus without complications: Secondary | ICD-10-CM | POA: Diagnosis not present

## 2021-04-24 DIAGNOSIS — Z95818 Presence of other cardiac implants and grafts: Secondary | ICD-10-CM

## 2021-04-24 DIAGNOSIS — N400 Enlarged prostate without lower urinary tract symptoms: Secondary | ICD-10-CM | POA: Diagnosis not present

## 2021-04-24 DIAGNOSIS — Z9889 Other specified postprocedural states: Secondary | ICD-10-CM

## 2021-04-24 DIAGNOSIS — W19XXXA Unspecified fall, initial encounter: Secondary | ICD-10-CM | POA: Diagnosis not present

## 2021-04-24 DIAGNOSIS — E039 Hypothyroidism, unspecified: Secondary | ICD-10-CM | POA: Diagnosis present

## 2021-04-24 DIAGNOSIS — R091 Pleurisy: Secondary | ICD-10-CM | POA: Diagnosis not present

## 2021-04-24 DIAGNOSIS — I5033 Acute on chronic diastolic (congestive) heart failure: Secondary | ICD-10-CM | POA: Diagnosis present

## 2021-04-24 DIAGNOSIS — I951 Orthostatic hypotension: Secondary | ICD-10-CM | POA: Diagnosis present

## 2021-04-24 DIAGNOSIS — Y92009 Unspecified place in unspecified non-institutional (private) residence as the place of occurrence of the external cause: Secondary | ICD-10-CM | POA: Diagnosis not present

## 2021-04-24 DIAGNOSIS — K4090 Unilateral inguinal hernia, without obstruction or gangrene, not specified as recurrent: Secondary | ICD-10-CM | POA: Diagnosis present

## 2021-04-24 DIAGNOSIS — I639 Cerebral infarction, unspecified: Secondary | ICD-10-CM | POA: Diagnosis not present

## 2021-04-24 DIAGNOSIS — I272 Pulmonary hypertension, unspecified: Secondary | ICD-10-CM | POA: Diagnosis not present

## 2021-04-24 DIAGNOSIS — R7401 Elevation of levels of liver transaminase levels: Secondary | ICD-10-CM | POA: Diagnosis present

## 2021-04-24 DIAGNOSIS — I5032 Chronic diastolic (congestive) heart failure: Secondary | ICD-10-CM | POA: Diagnosis not present

## 2021-04-24 DIAGNOSIS — R55 Syncope and collapse: Secondary | ICD-10-CM | POA: Diagnosis not present

## 2021-04-24 DIAGNOSIS — I083 Combined rheumatic disorders of mitral, aortic and tricuspid valves: Secondary | ICD-10-CM | POA: Diagnosis present

## 2021-04-24 DIAGNOSIS — Z20822 Contact with and (suspected) exposure to covid-19: Secondary | ICD-10-CM | POA: Diagnosis not present

## 2021-04-24 DIAGNOSIS — Z79899 Other long term (current) drug therapy: Secondary | ICD-10-CM

## 2021-04-24 DIAGNOSIS — Z8249 Family history of ischemic heart disease and other diseases of the circulatory system: Secondary | ICD-10-CM | POA: Diagnosis not present

## 2021-04-24 DIAGNOSIS — I341 Nonrheumatic mitral (valve) prolapse: Secondary | ICD-10-CM | POA: Diagnosis not present

## 2021-04-24 DIAGNOSIS — Z743 Need for continuous supervision: Secondary | ICD-10-CM | POA: Diagnosis not present

## 2021-04-24 DIAGNOSIS — E785 Hyperlipidemia, unspecified: Secondary | ICD-10-CM | POA: Diagnosis present

## 2021-04-24 DIAGNOSIS — G319 Degenerative disease of nervous system, unspecified: Secondary | ICD-10-CM | POA: Diagnosis not present

## 2021-04-24 DIAGNOSIS — I6523 Occlusion and stenosis of bilateral carotid arteries: Secondary | ICD-10-CM | POA: Diagnosis not present

## 2021-04-24 DIAGNOSIS — I493 Ventricular premature depolarization: Secondary | ICD-10-CM | POA: Diagnosis not present

## 2021-04-24 DIAGNOSIS — I471 Supraventricular tachycardia: Secondary | ICD-10-CM | POA: Diagnosis present

## 2021-04-24 DIAGNOSIS — I2510 Atherosclerotic heart disease of native coronary artery without angina pectoris: Secondary | ICD-10-CM | POA: Diagnosis present

## 2021-04-24 DIAGNOSIS — Z7982 Long term (current) use of aspirin: Secondary | ICD-10-CM

## 2021-04-24 DIAGNOSIS — J939 Pneumothorax, unspecified: Secondary | ICD-10-CM | POA: Diagnosis not present

## 2021-04-24 DIAGNOSIS — R4189 Other symptoms and signs involving cognitive functions and awareness: Secondary | ICD-10-CM

## 2021-04-24 DIAGNOSIS — J9811 Atelectasis: Secondary | ICD-10-CM | POA: Diagnosis not present

## 2021-04-24 DIAGNOSIS — Z7984 Long term (current) use of oral hypoglycemic drugs: Secondary | ICD-10-CM

## 2021-04-24 DIAGNOSIS — K5730 Diverticulosis of large intestine without perforation or abscess without bleeding: Secondary | ICD-10-CM | POA: Diagnosis not present

## 2021-04-24 DIAGNOSIS — I491 Atrial premature depolarization: Secondary | ICD-10-CM | POA: Diagnosis not present

## 2021-04-24 DIAGNOSIS — N1831 Chronic kidney disease, stage 3a: Secondary | ICD-10-CM | POA: Diagnosis present

## 2021-04-24 DIAGNOSIS — R0902 Hypoxemia: Secondary | ICD-10-CM | POA: Diagnosis not present

## 2021-04-24 DIAGNOSIS — J90 Pleural effusion, not elsewhere classified: Secondary | ICD-10-CM | POA: Diagnosis present

## 2021-04-24 DIAGNOSIS — R569 Unspecified convulsions: Secondary | ICD-10-CM | POA: Diagnosis not present

## 2021-04-24 DIAGNOSIS — R4020 Unspecified coma: Secondary | ICD-10-CM | POA: Diagnosis not present

## 2021-04-24 LAB — URINALYSIS, ROUTINE W REFLEX MICROSCOPIC
Bacteria, UA: NONE SEEN
Bilirubin Urine: NEGATIVE
Glucose, UA: NEGATIVE mg/dL
Ketones, ur: NEGATIVE mg/dL
Leukocytes,Ua: NEGATIVE
Nitrite: NEGATIVE
Protein, ur: 30 mg/dL — AB
Specific Gravity, Urine: 1.014 (ref 1.005–1.030)
pH: 6 (ref 5.0–8.0)

## 2021-04-24 LAB — OSMOLALITY, URINE: Osmolality, Ur: 445 mOsm/kg (ref 300–900)

## 2021-04-24 LAB — CBC WITH DIFFERENTIAL/PLATELET
Abs Immature Granulocytes: 0.03 10*3/uL (ref 0.00–0.07)
Basophils Absolute: 0 10*3/uL (ref 0.0–0.1)
Basophils Relative: 0 %
Eosinophils Absolute: 0.1 10*3/uL (ref 0.0–0.5)
Eosinophils Relative: 1 %
HCT: 34.8 % — ABNORMAL LOW (ref 39.0–52.0)
Hemoglobin: 11.6 g/dL — ABNORMAL LOW (ref 13.0–17.0)
Immature Granulocytes: 0 %
Lymphocytes Relative: 20 %
Lymphs Abs: 1.6 10*3/uL (ref 0.7–4.0)
MCH: 32.4 pg (ref 26.0–34.0)
MCHC: 33.3 g/dL (ref 30.0–36.0)
MCV: 97.2 fL (ref 80.0–100.0)
Monocytes Absolute: 0.6 10*3/uL (ref 0.1–1.0)
Monocytes Relative: 8 %
Neutro Abs: 5.9 10*3/uL (ref 1.7–7.7)
Neutrophils Relative %: 71 %
Platelets: 247 10*3/uL (ref 150–400)
RBC: 3.58 MIL/uL — ABNORMAL LOW (ref 4.22–5.81)
RDW: 13.2 % (ref 11.5–15.5)
WBC: 8.2 10*3/uL (ref 4.0–10.5)
nRBC: 0 % (ref 0.0–0.2)

## 2021-04-24 LAB — I-STAT CHEM 8, ED
BUN: 22 mg/dL (ref 8–23)
Calcium, Ion: 1.21 mmol/L (ref 1.15–1.40)
Chloride: 94 mmol/L — ABNORMAL LOW (ref 98–111)
Creatinine, Ser: 1.2 mg/dL (ref 0.61–1.24)
Glucose, Bld: 140 mg/dL — ABNORMAL HIGH (ref 70–99)
HCT: 36 % — ABNORMAL LOW (ref 39.0–52.0)
Hemoglobin: 12.2 g/dL — ABNORMAL LOW (ref 13.0–17.0)
Potassium: 4.5 mmol/L (ref 3.5–5.1)
Sodium: 126 mmol/L — ABNORMAL LOW (ref 135–145)
TCO2: 22 mmol/L (ref 22–32)

## 2021-04-24 LAB — I-STAT VENOUS BLOOD GAS, ED
Acid-base deficit: 1 mmol/L (ref 0.0–2.0)
Bicarbonate: 23.3 mmol/L (ref 20.0–28.0)
Calcium, Ion: 1.19 mmol/L (ref 1.15–1.40)
HCT: 34 % — ABNORMAL LOW (ref 39.0–52.0)
Hemoglobin: 11.6 g/dL — ABNORMAL LOW (ref 13.0–17.0)
O2 Saturation: 36 %
Potassium: 4.5 mmol/L (ref 3.5–5.1)
Sodium: 127 mmol/L — ABNORMAL LOW (ref 135–145)
TCO2: 24 mmol/L (ref 22–32)
pCO2, Ven: 37.7 mmHg — ABNORMAL LOW (ref 44.0–60.0)
pH, Ven: 7.399 (ref 7.250–7.430)
pO2, Ven: 21 mmHg — CL (ref 32.0–45.0)

## 2021-04-24 LAB — CBG MONITORING, ED: Glucose-Capillary: 154 mg/dL — ABNORMAL HIGH (ref 70–99)

## 2021-04-24 LAB — TROPONIN I (HIGH SENSITIVITY)
Troponin I (High Sensitivity): 79 ng/L — ABNORMAL HIGH (ref <18)
Troponin I (High Sensitivity): 135 ng/L (ref <18)

## 2021-04-24 LAB — RESP PANEL BY RT-PCR (FLU A&B, COVID) ARPGX2
Influenza A by PCR: NEGATIVE
Influenza B by PCR: NEGATIVE
SARS Coronavirus 2 by RT PCR: NEGATIVE

## 2021-04-24 LAB — COMPREHENSIVE METABOLIC PANEL
ALT: 110 U/L — ABNORMAL HIGH (ref 0–44)
AST: 114 U/L — ABNORMAL HIGH (ref 15–41)
Albumin: 3.5 g/dL (ref 3.5–5.0)
Alkaline Phosphatase: 116 U/L (ref 38–126)
Anion gap: 11 (ref 5–15)
BUN: 21 mg/dL (ref 8–23)
CO2: 20 mmol/L — ABNORMAL LOW (ref 22–32)
Calcium: 9.1 mg/dL (ref 8.9–10.3)
Chloride: 92 mmol/L — ABNORMAL LOW (ref 98–111)
Creatinine, Ser: 1.33 mg/dL — ABNORMAL HIGH (ref 0.61–1.24)
GFR, Estimated: 51 mL/min — ABNORMAL LOW (ref >60)
Glucose, Bld: 178 mg/dL — ABNORMAL HIGH (ref 70–99)
Potassium: 4.5 mmol/L (ref 3.5–5.1)
Sodium: 123 mmol/L — ABNORMAL LOW (ref 135–145)
Total Bilirubin: 0.7 mg/dL (ref 0.3–1.2)
Total Protein: 6.5 g/dL (ref 6.5–8.1)

## 2021-04-24 LAB — LACTIC ACID, PLASMA
Lactic Acid, Venous: 2.6 mmol/L (ref 0.5–1.9)
Lactic Acid, Venous: 1.7 mmol/L (ref 0.5–1.9)

## 2021-04-24 LAB — CREATININE, URINE, RANDOM: Creatinine, Urine: 49.09 mg/dL

## 2021-04-24 LAB — SODIUM, URINE, RANDOM: Sodium, Ur: 53 mmol/L

## 2021-04-24 LAB — ETHANOL: Alcohol, Ethyl (B): <10 mg/dL (ref <10)

## 2021-04-24 MED ORDER — SODIUM CHLORIDE 0.9% FLUSH
3.0000 mL | Freq: Two times a day (BID) | INTRAVENOUS | Status: DC
  Administered 2021-04-24 – 2021-04-26 (×2): 3 mL via INTRAVENOUS

## 2021-04-24 MED ORDER — SORBITOL 70 % SOLN
200.0000 mL | TOPICAL_OIL | Freq: Once | ORAL | Status: DC | PRN
  Filled 2021-04-24: qty 60

## 2021-04-24 MED ORDER — ALBUTEROL SULFATE (2.5 MG/3ML) 0.083% IN NEBU: 2.5000 mg | INHALATION_SOLUTION | Freq: Four times a day (QID) | RESPIRATORY_TRACT | Status: DC | PRN

## 2021-04-24 MED ORDER — ENOXAPARIN SODIUM 40 MG/0.4ML IJ SOSY
40.0000 mg | PREFILLED_SYRINGE | INTRAMUSCULAR | Status: DC
  Administered 2021-04-24 – 2021-04-26 (×2): 40 mg via SUBCUTANEOUS
  Filled 2021-04-24 (×3): qty 0.4

## 2021-04-24 MED ORDER — ACETAMINOPHEN 325 MG PO TABS: 650.0000 mg | ORAL_TABLET | Freq: Four times a day (QID) | ORAL | Status: DC | PRN

## 2021-04-24 MED ORDER — FUROSEMIDE 10 MG/ML IJ SOLN
40.0000 mg | Freq: Once | INTRAMUSCULAR | Status: AC
Start: 2021-04-24 — End: 2021-04-24
  Administered 2021-04-24: 40 mg via INTRAVENOUS
  Filled 2021-04-24: qty 4

## 2021-04-24 MED ORDER — ACETAMINOPHEN 650 MG RE SUPP: 650.0000 mg | Freq: Four times a day (QID) | RECTAL | Status: DC | PRN

## 2021-04-24 MED ORDER — ONDANSETRON HCL 4 MG/2ML IJ SOLN: 4.0000 mg | Freq: Four times a day (QID) | INTRAMUSCULAR | Status: DC | PRN

## 2021-04-24 MED ORDER — ONDANSETRON HCL 4 MG PO TABS: 4.0000 mg | ORAL_TABLET | Freq: Four times a day (QID) | ORAL | Status: DC | PRN

## 2021-04-24 MED ORDER — SENNOSIDES-DOCUSATE SODIUM 8.6-50 MG PO TABS
1.0000 | ORAL_TABLET | Freq: Every day | ORAL | Status: DC
  Administered 2021-04-24 – 2021-04-25 (×2): 1 via ORAL
  Filled 2021-04-24 (×2): qty 1

## 2021-04-24 MED ORDER — IOHEXOL 350 MG/ML SOLN
100.0000 mL | Freq: Once | INTRAVENOUS | Status: AC | PRN
  Administered 2021-04-24: 100 mL via INTRAVENOUS

## 2021-04-24 NOTE — Consult Note (Signed)
Consult Note  Adrian Olson 1931/03/09  [id number].    Requesting MD: Dr. Tamala Julian Chief Complaint/Reason for Consult: Ground-level fall  HPI:  87 year old male with medical history significant for hypertension, hyperlipidemia, MVP with MR s/p mitral valve clip, carotid artery stenosis, DM type II who presented to the [facility] emergency department via EMS after having an episode of unresponsiveness at home this morning.  Patient does not remember events leading up to EMS arrival at his home.  His wife is bedside and was present.  She notes that patient remarked he needed to urinate and she tried to help him up out of bed but he was obviously altered and not following commands.  Work-up in ED significant for patient afebrile, hemoglobin 11.6, creatinine 1.33, CT angiogram of chest abdomen pelvis with large right pleural effusion collection and small moderate left fluid collection with associated bibasilar atelectasis and multiple mildly displaced and slightly comminuted rib fractures at the posterior lateral right ribs 6 through 11.  Patient reports he fell on Thanksgiving day into a dresser with resulting significant pain and bruising.  He did see his PCP for this and was prescribed pain medication.  Since then he has felt progressively worse with increasing shortness of breath and decreasing appetite.  He lives at home with his wife.  His daughter is also at bedside who provides support.  Substance use: denies alcohol use, never smoker Allergies: Duloxetine Blood thinners: 81 mg aspirin  ROS: Review of Systems  Constitutional:  Negative for chills and fever.  Respiratory:  Positive for shortness of breath. Negative for cough and wheezing.   Cardiovascular:  Negative for chest pain, palpitations and leg swelling.  Gastrointestinal:  Negative for abdominal pain, constipation, diarrhea, nausea and vomiting.  Genitourinary: Negative.   Musculoskeletal:  Positive for falls.        Chest wall pain on right  Neurological:  Negative for loss of consciousness.   Family History  Problem Relation Age of Onset   Cancer Mother        ovarian   Diabetes Father    Other Father        enlarged prostate   Stroke Father    Heart disease Father    Heart disease Sister    Obesity Sister    Dementia Sister    Diabetes Sister    Other Sister        nornmal old age problems    Past Medical History:  Diagnosis Date   Bilateral carotid artery disease ([place])    Severe Left and moderate Right   Diabetes mellitus without complication [facility])    ED (erectile dysfunction)    Hyperlipidemia    Hypertension    Lumbar disc disease    Mitral valve prolapse    moderate to severe MR, LVEF 55% Echo 2010   Moderate mitral regurgitation 03/16/2014   S/P mitral valve clip implantation 10/09/2019   s/p TEER using 2 MitraClip XTW devices, first device positioned A2 P2, second device positioned A1 P1 by Dr. Burt Knack    Past Surgical History:  Procedure Laterality Date   CARDIAC CATHETERIZATION     MITRAL VALVE REPAIR  10/09/2019   MITRAL VALVE REPAIR (N/A )   MITRAL VALVE REPAIR N/A 10/09/2019   Procedure: MITRAL VALVE REPAIR;  Surgeon: Sherren Mocha, MD;  Location: [place] [facility];  Service: Cardiovascular;  Laterality: N/A;   RIGHT/LEFT HEART CATH AND CORONARY ANGIOGRAPHY N/A 09/16/2019   Procedure: RIGHT/LEFT HEART CATH  AND CORONARY ANGIOGRAPHY;  Surgeon: Belva Crome, MD;  Location: [place] [facility];  Service: Cardiovascular;  Laterality: N/A;   TEE WITHOUT CARDIOVERSION N/A 09/16/2019   Procedure: TRANSESOPHAGEAL ECHOCARDIOGRAM (TEE);  Surgeon: Lelon Perla, MD;  Location: [facility] ENDOSCOPY;  Service: Cardiovascular;  Laterality: N/A;    Social History:  reports that he has never smoked. He has never used smokeless tobacco. He reports that he does not drink alcohol and does not use drugs.  Allergies:  Allergies  Allergen Reactions   Duloxetine Hcl Other (See Comments)     (Not in a hospital admission)   Blood pressure 138/81, pulse 71, temperature 97.9 F (36.6 C), temperature source Rectal, resp. rate 20, SpO2 98 %. Physical Exam: General: pleasant, WD,  male who is laying in bed in NAD HEENT: head is normocephalic, atraumatic.  Sclera are noninjected.  Pupils equal and round. EOMs intact.  Ears and nose without any masses or lesions.  Mouth is pink and moist Heart: regular, rate, and rhythm.  Normal s1,s2. No obvious murmurs, gallops, or rubs noted.  Palpable radial and pedal pulses bilaterally Lungs: CTAB - diminished lung sounds in lower lobes bilaterally, no wheezes, rhonchi, or rales noted.  Respiratory effort nonlabored on supp O2 via [place] Abd: soft, NT, ND, +BS, no masses, hernias, or organomegaly.  Diffuse ecchymosis to right flank and lower abdomen MSK: all 4 extremities are symmetrical with no cyanosis, clubbing, or edema. Skin: warm and dry with no masses, lesions, or rashes Neuro: Cranial nerves 2-12 grossly intact, sensation is normal throughout Psych: A&Ox3 with an appropriate affect.    Results for orders placed or performed during the hospital encounter of 04/24/21 (from the past 48 hour(s))  Comprehensive metabolic panel     Status: Abnormal   Collection Time: 04/24/21  5:55 AM  Result Value Ref Range   Sodium 123 (L) 135 - 145 mmol/L   Potassium 4.5 3.5 - 5.1 mmol/L   Chloride 92 (L) 98 - 111 mmol/L   CO2 20 (L) 22 - 32 mmol/L   Glucose, Bld 178 (H) 70 - 99 mg/dL    Comment: Glucose reference range applies only to samples taken after fasting for at least 8 hours.   BUN 21 8 - 23 mg/dL   Creatinine, Ser 1.33 (H) 0.61 - 1.24 mg/dL   Calcium 9.1 8.9 - 10.3 mg/dL   Total Protein 6.5 6.5 - 8.1 g/dL   Albumin 3.5 3.5 - 5.0 g/dL   AST 114 (H) 15 - 41 U/L   ALT 110 (H) 0 - 44 U/L   Alkaline Phosphatase 116 38 - 126 U/L   Total Bilirubin 0.7 0.3 - 1.2 mg/dL   GFR, Estimated 51 (L) >60 mL/min    Comment: (NOTE) Calculated using the  CKD-EPI Creatinine Equation (2021)    Anion gap 11 5 - 15    Comment: Performed at [facility] [address] [place], [place] [address]  CBC WITH DIFFERENTIAL     Status: Abnormal   Collection Time: 04/24/21  5:55 AM  Result Value Ref Range   WBC 8.2 4.0 - 10.5 K/uL   RBC 3.58 (L) 4.22 - 5.81 MIL/uL   Hemoglobin 11.6 (L) 13.0 - 17.0 g/dL   HCT 34.8 (L) 39.0 - 52.0 %   MCV 97.2 80.0 - 100.0 fL   MCH 32.4 26.0 - 34.0 pg   MCHC 33.3 30.0 - 36.0 g/dL   RDW 13.2 11.5 - 15.5 %   Platelets  247 150 - 400 K/uL   nRBC 0.0 0.0 - 0.2 %   Neutrophils Relative % 71 %   Neutro Abs 5.9 1.7 - 7.7 K/uL   Lymphocytes Relative 20 %   Lymphs Abs 1.6 0.7 - 4.0 K/uL   Monocytes Relative 8 %   Monocytes Absolute 0.6 0.1 - 1.0 K/uL   Eosinophils Relative 1 %   Eosinophils Absolute 0.1 0.0 - 0.5 K/uL   Basophils Relative 0 %   Basophils Absolute 0.0 0.0 - 0.1 K/uL   Immature Granulocytes 0 %   Abs Immature Granulocytes 0.03 0.00 - 0.07 K/uL    Comment: Performed at [facility] [address], [place], [place] [address]  Troponin I (High Sensitivity)     Status: Abnormal   Collection Time: 04/24/21  5:55 AM  Result Value Ref Range   Troponin I (High Sensitivity) 79 (H) <18 ng/L    Comment: (NOTE) Elevated high sensitivity troponin I (hsTnI) values and significant  changes across serial measurements may suggest ACS but many other  chronic and acute conditions are known to elevate hsTnI results.  Refer to the "Links" section for chest pain algorithms and additional  guidance. Performed at [facility], [place] [address], [place], [place] [address]   Lactic acid, plasma     Status: Abnormal   Collection Time: 04/24/21  5:56 AM  Result Value Ref Range   Lactic Acid, Venous 2.6 (HH) 0.5 - 1.9 mmol/L    Comment: CRITICAL RESULT CALLED TO, READ BACK BY AND VERIFIED WITH: Lucienne Capers RN BY SSTEPHENS [phone] Performed at [facility], [place] [address] [place], [place]  [address]   Ethanol     Status: None   Collection Time: 04/24/21  5:56 AM  Result Value Ref Range   Alcohol, Ethyl (B) <10 <10 mg/dL    Comment: (NOTE) Lowest detectable limit for serum alcohol is 10 mg/dL.  For medical purposes only. Performed at [facility], [place] [address], [place], [place] [address]   Urinalysis, Routine w reflex microscopic     Status: Abnormal   Collection Time: 04/24/21  6:24 AM  Result Value Ref Range   Color, Urine YELLOW YELLOW   APPearance CLEAR CLEAR   Specific Gravity, Urine 1.014 1.005 - 1.030   pH 6.0 5.0 - 8.0   Glucose, UA NEGATIVE NEGATIVE mg/dL   Hgb urine dipstick SMALL (A) NEGATIVE   Bilirubin Urine NEGATIVE NEGATIVE   Ketones, ur NEGATIVE NEGATIVE mg/dL   Protein, ur 30 (A) NEGATIVE mg/dL   Nitrite NEGATIVE NEGATIVE   Leukocytes,Ua NEGATIVE NEGATIVE   RBC / HPF 11-20 0 - 5 RBC/hpf   WBC, UA 0-5 0 - 5 WBC/hpf   Bacteria, UA NONE SEEN NONE SEEN    Comment: Performed at [facility] [address], [place], [place] [address]  CBG monitoring, ED     Status: Abnormal   Collection Time: 04/24/21  6:30 AM  Result Value Ref Range   Glucose-Capillary 154 (H) 70 - 99 mg/dL    Comment: Glucose reference range applies only to samples taken after fasting for at least 8 hours.  I-Stat venous blood gas, ED     Status: Abnormal   Collection Time: 04/24/21  7:47 AM  Result Value Ref Range   pH, Ven 7.399 7.250 - 7.430   pCO2, Ven 37.7 (L) 44.0 - 60.0 mmHg   pO2, Ven 21.0 (LL) 32.0 - 45.0 mmHg  Bicarbonate 23.3 20.0 - 28.0 mmol/L   TCO2 24 22 - 32 mmol/L   O2 Saturation 36.0 %   Acid-base deficit 1.0 0.0 - 2.0 mmol/L   Sodium 127 (L) 135 - 145 mmol/L   Potassium 4.5 3.5 - 5.1 mmol/L   Calcium, Ion 1.19 1.15 - 1.40 mmol/L   HCT 34.0 (L) 39.0 - 52.0 %   Hemoglobin 11.6 (L) 13.0 - 17.0 g/dL   Sample type VENOUS    Comment NOTIFIED PHYSICIAN   I-Stat Chem 8, ED     Status: Abnormal   Collection Time: 04/24/21  7:48 AM  Result Value Ref  Range   Sodium 126 (L) 135 - 145 mmol/L   Potassium 4.5 3.5 - 5.1 mmol/L   Chloride 94 (L) 98 - 111 mmol/L   BUN 22 8 - 23 mg/dL   Creatinine, Ser 1.20 0.61 - 1.24 mg/dL   Glucose, Bld 140 (H) 70 - 99 mg/dL    Comment: Glucose reference range applies only to samples taken after fasting for at least 8 hours.   Calcium, Ion 1.21 1.15 - 1.40 mmol/L   TCO2 22 22 - 32 mmol/L   Hemoglobin 12.2 (L) 13.0 - 17.0 g/dL   HCT 36.0 (L) 39.0 - 52.0 %  Troponin I (High Sensitivity)     Status: Abnormal   Collection Time: 04/24/21  8:40 AM  Result Value Ref Range   Troponin I (High Sensitivity) 135 (HH) <18 ng/L    Comment: CRITICAL RESULT CALLED TO, READ BACK BY AND VERIFIED WITH: Gareth Eagle RN BY SSTEPHENS [phone] [phone] (NOTE) Elevated high sensitivity troponin I (hsTnI) values and significant  changes across serial measurements may suggest ACS but many other  chronic and acute conditions are known to elevate hsTnI results.  Refer to the Links section for chest pain algorithms and additional  guidance. Performed at [facility], [place] [address] [place], [place] [address]   Lactic acid, plasma     Status: None   Collection Time: 04/24/21  8:49 AM  Result Value Ref Range   Lactic Acid, Venous 1.7 0.5 - 1.9 mmol/L    Comment: Performed at [facility] [address] [place], [place] [address]   CT HEAD WO CONTRAST  Result Date: 04/24/2021 CLINICAL DATA:  87 year old male new onset seizure. Became unresponsive. EXAM: CT HEAD WITHOUT CONTRAST TECHNIQUE: Contiguous axial images were obtained from the base of the skull through the vertex without intravenous contrast. COMPARISON:  None. FINDINGS: Brain: Cerebral volume is within normal limits for age. No midline shift, ventriculomegaly, mass effect, evidence of mass lesion, intracranial hemorrhage or evidence of cortically based acute infarction. Mild dystrophic calcifications bilateral basal ganglia. Gray-white matter differentiation  within normal limits for age. No cortical encephalomalacia identified. Vascular: Calcified atherosclerosis at the skull base. No suspicious intracranial vascular hyperdensity. Skull: No fracture identified. Sinuses/Orbits: Visualized paranasal sinuses and mastoids are well aerated. Other: No discrete orbit or scalp soft tissue injury identified. IMPRESSION: Normal for age non contrast CT appearance of the brain. No acute traumatic injury identified. Electronically Signed   By: Genevie Ann M.D.   On: 04/24/2021 07:39   CT Angio Chest PE W and/or Wo Contrast  Result Date: 04/24/2021 CLINICAL DATA:  Possible pulmonary embolism. Syncope at home when getting up to go to the bathroom. EXAM: CT ANGIOGRAPHY CHEST CT ABDOMEN AND PELVIS WITH CONTRAST TECHNIQUE: Multidetector CT imaging of the chest was performed using the standard protocol during bolus administration of intravenous  contrast. Multiplanar CT image reconstructions and MIPs were obtained to evaluate the vascular anatomy. Multidetector CT imaging of the abdomen and pelvis was performed using the standard protocol during bolus administration of intravenous contrast. CONTRAST:  159mL OMNIPAQUE IOHEXOL 350 MG/ML SOLN COMPARISON:  CT abdomen/pelvis 10/08/2008 FINDINGS: CTA CHEST FINDINGS Cardiovascular: Mild-to-moderate cardiomegaly. Prosthetic mitral valve noted. Mild calcified plaque over the left main and right coronary arteries. Ascending thoracic aorta measures approximately 3.6 cm in AP diameter. Calcified plaque over the descending thoracic aorta. Pulmonary arterial system is well opacified and demonstrates no evidence of emboli. Mediastinum/Nodes: No definite mediastinal or hilar adenopathy. Remaining mediastinal structures are unremarkable. Lungs/Pleura: Moderate moderate to large right pleural fluid collection and small to moderate left pleural fluid collection with associated atelectatic change within the lung bases. No pneumothorax. Airways are  unremarkable. Musculoskeletal: Numerous right posterior and lateral rib fractures involving ribs 6 through 11. Review of the MIP images confirms the above findings. CT ABDOMEN and PELVIS FINDINGS Hepatobiliary: Liver and biliary tree are normal. Tiny amount of adjacent pericholecystic fluid as the gallbladder is unremarkable. Pancreas: Normal. Spleen: Normal. Adrenals/Urinary Tract: Adrenal glands are normal. Kidneys are normal in size without focal mass or hydronephrosis. Ureters and bladder are normal. Stomach/Bowel: Stomach and small bowel are normal. Diverticulosis of the colon with mild fecal retention throughout the colon. Appendix not visualized. Vascular/Lymphatic: Mild-to-moderate calcified plaque over the abdominal aorta which is normal in caliber. No adenopathy. Reproductive: Mild prostatic enlargement with moderate pressure upon the bladder base. Other: No significant free peritoneal fluid and no free peritoneal air. Small right inguinal hernia containing peritoneal fat and tiny amount of fluid. Musculoskeletal: Degenerative change of the spine. No acute fracture. Review of the MIP images confirms the above findings. IMPRESSION: 1. No evidence of pulmonary embolism. 2. Moderate to large right pleural fluid collection and small to moderate left pleural fluid collection with associated bibasilar atelectasis. No pneumothorax. Multiple associated mildly displaced and slightly comminuted fractures of posterolateral right ribs 6 through 11. 3. No acute findings in the abdomen/pelvis. 4. Colonic diverticulosis without active inflammation. 5. Mild prostatic enlargement with moderate impression upon the bladder base. 6. Small right inguinal hernia containing peritoneal fat and tiny amount of fluid. 7. Aortic atherosclerosis. Atherosclerotic coronary artery disease. Aortic Atherosclerosis (ICD10-I70.0). Electronically Signed   By: Marin Olp M.D.   On: 04/24/2021 09:09   CT ABDOMEN PELVIS W CONTRAST  Result  Date: 04/24/2021 CLINICAL DATA:  Possible pulmonary embolism. Syncope at home when getting up to go to the bathroom. EXAM: CT ANGIOGRAPHY CHEST CT ABDOMEN AND PELVIS WITH CONTRAST TECHNIQUE: Multidetector CT imaging of the chest was performed using the standard protocol during bolus administration of intravenous contrast. Multiplanar CT image reconstructions and MIPs were obtained to evaluate the vascular anatomy. Multidetector CT imaging of the abdomen and pelvis was performed using the standard protocol during bolus administration of intravenous contrast. CONTRAST:  176mL OMNIPAQUE IOHEXOL 350 MG/ML SOLN COMPARISON:  CT abdomen/pelvis 10/08/2008 FINDINGS: CTA CHEST FINDINGS Cardiovascular: Mild-to-moderate cardiomegaly. Prosthetic mitral valve noted. Mild calcified plaque over the left main and right coronary arteries. Ascending thoracic aorta measures approximately 3.6 cm in AP diameter. Calcified plaque over the descending thoracic aorta. Pulmonary arterial system is well opacified and demonstrates no evidence of emboli. Mediastinum/Nodes: No definite mediastinal or hilar adenopathy. Remaining mediastinal structures are unremarkable. Lungs/Pleura: Moderate moderate to large right pleural fluid collection and small to moderate left pleural fluid collection with associated atelectatic change within the lung bases. No pneumothorax. Airways are  unremarkable. Musculoskeletal: Numerous right posterior and lateral rib fractures involving ribs 6 through 11. Review of the MIP images confirms the above findings. CT ABDOMEN and PELVIS FINDINGS Hepatobiliary: Liver and biliary tree are normal. Tiny amount of adjacent pericholecystic fluid as the gallbladder is unremarkable. Pancreas: Normal. Spleen: Normal. Adrenals/Urinary Tract: Adrenal glands are normal. Kidneys are normal in size without focal mass or hydronephrosis. Ureters and bladder are normal. Stomach/Bowel: Stomach and small bowel are normal. Diverticulosis of  the colon with mild fecal retention throughout the colon. Appendix not visualized. Vascular/Lymphatic: Mild-to-moderate calcified plaque over the abdominal aorta which is normal in caliber. No adenopathy. Reproductive: Mild prostatic enlargement with moderate pressure upon the bladder base. Other: No significant free peritoneal fluid and no free peritoneal air. Small right inguinal hernia containing peritoneal fat and tiny amount of fluid. Musculoskeletal: Degenerative change of the spine. No acute fracture. Review of the MIP images confirms the above findings. IMPRESSION: 1. No evidence of pulmonary embolism. 2. Moderate to large right pleural fluid collection and small to moderate left pleural fluid collection with associated bibasilar atelectasis. No pneumothorax. Multiple associated mildly displaced and slightly comminuted fractures of posterolateral right ribs 6 through 11. 3. No acute findings in the abdomen/pelvis. 4. Colonic diverticulosis without active inflammation. 5. Mild prostatic enlargement with moderate impression upon the bladder base. 6. Small right inguinal hernia containing peritoneal fat and tiny amount of fluid. 7. Aortic atherosclerosis. Atherosclerotic coronary artery disease. Aortic Atherosclerosis (ICD10-I70.0). Electronically Signed   By: Marin Olp M.D.   On: 04/24/2021 09:09      Assessment/Plan GLF  R 6-11 rib fx - no PTX on CT. pulmonary toilet, incentive spirometry, wean O2 as able, pain control Moderate to large R pleural effusion -recommend thoracentesis and primary team has reached out to CCM.  IR able to do Monday if needed.  Do not recommend chest tube at this time given interval since fall Small to moderate left pleural effusion - monitor  Agree with medical admission, we will follow along with you  FEN: Heart healthy ID: None currently VTE: Lovenox  Dispo: Thoracentesis, PT/OT  Per primary: Hyponatremia Renal  insufficiency Hypertension Transaminitis Hypothyroidism Right inguinal hernia Hyperlipidemia  Winferd Humphrey, [facility] Surgery 04/24/2021, 10:35 AM Please see Amion for pager number during day hours 7:00am-4:30pm

## 2021-04-24 NOTE — ED Notes (Signed)
Pt's condom catheter had fallen off. Provided peri care to pt, changed chux and brief, placed a new condom catheter on pt along with new blankets. Pt is resting comfortably.

## 2021-04-24 NOTE — ED Provider Notes (Addendum)
[place] EMERGENCY DEPARTMENT Provider Note  CSN: [id number] Arrival date & time: 04/24/21 0535  Chief Complaint(s) Altered Mental Status Patient is from home, got up to go to the bathroom, was not able to get up and became stiff/tense and unresponsive.  Patient was incontinent of urine after the episode.  Patient has some oral trauma on tongue.  No history of seizures.  Patient was pale, cool and diaphoretic and slow to respond upon EMS arrival.  Patient with crackles left lower lobe.  Patient fell three weeks ago, did not come to hospital but does have bruising to right lower flank.  Patient is now CAOx4.  HPI Adrian Olson is a 87 y.o. male brought in by EMS for altered mental status.  Possible seizure at home.  Per EMS they were called out by the wife who reported patient woke her up early this morning to help get him up to go to the restroom.  Before getting up patient went unresponsive and stiffened up.  He became incontinent.  Episode lasted approximately a few minutes.  He was slow to respond afterwards.  When EMS arrived patient was pale and slow to respond, but AO x4. He was sating 85% on RA. Does not wear O2 at home. Patient does not remember the episode.  He denied any recent nausea or vomiting.  No diarrhea.  No abdominal pain.  Patient does have right-sided rib pain related to recent fall around Thanksgiving.  The history is provided by the EMS personnel and the patient.   Past Medical History Past Medical History:  Diagnosis Date   Bilateral carotid artery disease (HCC)    Severe Left and moderate Right   Diabetes mellitus without complication [facility])    ED (erectile dysfunction)    Hyperlipidemia    Hypertension    Lumbar disc disease    Mitral valve prolapse    moderate to severe MR, LVEF 55% Echo 2010   Moderate mitral regurgitation 03/16/2014   S/P mitral valve clip implantation 10/09/2019   s/p TEER using 2 MitraClip XTW devices, first device  positioned A2 P2, second device positioned A1 P1 by Dr. Burt Knack   Patient Active Problem List   Diagnosis Date Noted   Acute on chronic diastolic heart failure ([place]) 10/10/2019   S/P mitral valve clip implantation 10/09/2019   Mitral prolapse 03/16/2014   Nonrheumatic mitral (valve) insufficiency 03/16/2014   Bilateral carotid artery disease ([place]) 03/16/2014   Essential hypertension 03/16/2014   Hyperlipidemia 03/16/2014   Erectile dysfunction 03/16/2014   Home Medication(s) Prior to Admission medications   Medication Sig Start Date End Date Taking? Authorizing Provider  acetaminophen (TYLENOL) 500 MG tablet Take 1,000 mg by mouth every 6 (six) hours as needed (for pain.).    [provider]  amoxicillin (AMOXIL) 500 MG tablet Take 4 capsules (2,000 mg) one hour prior to all dental visits. 10/15/19   Eileen Stanford, PA-C  aspirin 81 MG EC tablet Take 81 mg by mouth daily.    [provider]  chlordiazePOXIDE (LIBRIUM) 10 MG capsule Take 1 tablet by mouth daily. 07/15/09   [provider]  Cholecalciferol (VITAMIN D) 2000 UNITS tablet Take 2,000 Units by mouth daily at 12 noon.     [provider]  Cyanocobalamin (VITAMIN B-12) 2000 MCG TBCR 1 tablet    [provider]  finasteride (PROSCAR) 5 MG tablet Take 5 mg by mouth daily at 12 noon.     [provider]  levothyroxine (SYNTHROID, LEVOTHROID) 75 MCG tablet Take 75 mcg by mouth daily before breakfast.    [provider]  metFORMIN (GLUCOPHAGE) 500 MG tablet Take 500 mg by mouth every evening.  04/23/16   [provider]  pravastatin (PRAVACHOL) 20 MG tablet Take 20 mg by mouth at bedtime.     [provider]  psyllium (METAMUCIL) 58.6 % packet Take 1 packet by mouth daily.    [provider]  ramipril (ALTACE) 5 MG capsule Take 5 mg by mouth daily.     [provider]  sodium chloride (OCEAN) 0.65 % SOLN nasal spray Place 1 spray into  both nostrils daily as needed for congestion.    [provider]  tamsulosin (FLOMAX) 0.4 MG CAPS capsule Take 0.4 mg by mouth daily at 12 noon.  04/13/18   [provider]  Thiamine HCl (VITAMIN B1 PO) Take 125 mcg by mouth daily.    [provider]                                                                                                                                    Past Surgical History Past Surgical History:  Procedure Laterality Date   CARDIAC CATHETERIZATION     MITRAL VALVE REPAIR  10/09/2019   MITRAL VALVE REPAIR (N/A )   MITRAL VALVE REPAIR N/A 10/09/2019   Procedure: MITRAL VALVE REPAIR;  Surgeon: Sherren Mocha, MD;  Location: [place] CV LAB;  Service: Cardiovascular;  Laterality: N/A;   RIGHT/LEFT HEART CATH AND CORONARY ANGIOGRAPHY N/A 09/16/2019   Procedure: RIGHT/LEFT HEART CATH AND CORONARY ANGIOGRAPHY;  Surgeon: Belva Crome, MD;  Location: [place] CV LAB;  Service: Cardiovascular;  Laterality: N/A;   TEE WITHOUT CARDIOVERSION N/A 09/16/2019   Procedure: TRANSESOPHAGEAL ECHOCARDIOGRAM (TEE);  Surgeon: Lelon Perla, MD;  [facility];  Service: Cardiovascular;  Laterality: N/A;   Family History Family History  Problem Relation Age of Onset   Cancer Mother        ovarian   Diabetes Father    Other Father        enlarged prostate   Stroke Father    Heart disease Father    Heart disease Sister    Obesity Sister    Dementia Sister    Diabetes Sister    Other Sister        nornmal old age problems    Social History Social History   Tobacco Use   Smoking status: Never   Smokeless tobacco: Never  Vaping Use   Vaping Use: Never used  Substance Use Topics   Alcohol use: No    Alcohol/week: 0.0 standard drinks   Drug use: No   Allergies Duloxetine hcl  Review of Systems Review of Systems All other systems are reviewed and are negative for acute change except as noted in the HPI  Physical  Exam Vital Signs  I have  reviewed the triage vital signs BP (!) 150/88   Pulse 84   Temp 97.9 F (36.6 C) (Rectal)   Resp (!) 23   SpO2 98%   Physical Exam Vitals reviewed.  Constitutional:      General: He is not in acute distress.    Appearance: He is well-developed. He is not diaphoretic.  HENT:     Head: Normocephalic and atraumatic.     Nose: Nose normal.  Eyes:     General: No scleral icterus.       Right eye: No discharge.        Left eye: No discharge.     Conjunctiva/sclera: Conjunctivae normal.     Pupils: Pupils are equal, round, and reactive to light.  Cardiovascular:     Rate and Rhythm: Normal rate. Rhythm regularly irregular.     Heart sounds: No murmur heard.   No friction rub. No gallop.  Pulmonary:     Effort: Pulmonary effort is normal. No respiratory distress.     Breath sounds: Normal breath sounds. No stridor. No rales.  Chest:     Chest wall: Tenderness present.    Abdominal:     General: There is no distension.     Palpations: Abdomen is soft.     Tenderness: There is no abdominal tenderness.  Musculoskeletal:        General: No tenderness.       Arms:     Cervical back: Normal range of motion and neck supple.  Skin:    General: Skin is warm and dry.     Findings: No erythema or rash.  Neurological:     Mental Status: He is alert and oriented to person, place, and time.    ED Results and Treatments Labs (all labs ordered are listed, but only abnormal results are displayed) Labs Reviewed  COMPREHENSIVE METABOLIC PANEL - Abnormal; Notable for the following components:      Result Value   Sodium 123 (*)    Chloride 92 (*)    CO2 20 (*)    Glucose, Bld 178 (*)    Creatinine, Ser 1.33 (*)    AST 114 (*)    ALT 110 (*)    GFR, Estimated 51 (*)    All other components within normal limits  CBC WITH DIFFERENTIAL/PLATELET - Abnormal; Notable for the following components:   RBC 3.58 (*)    Hemoglobin 11.6 (*)    HCT 34.8 (*)    All  other components within normal limits  URINALYSIS, ROUTINE W REFLEX MICROSCOPIC - Abnormal; Notable for the following components:   Hgb urine dipstick SMALL (*)    Protein, ur 30 (*)    All other components within normal limits  CBG MONITORING, ED - Abnormal; Notable for the following components:   Glucose-Capillary 154 (*)    All other components within normal limits  TROPONIN I (HIGH SENSITIVITY) - Abnormal; Notable for the following components:   Troponin I (High Sensitivity) 79 (*)    All other components within normal limits  CULTURE, BLOOD (ROUTINE X 2)  CULTURE, BLOOD (ROUTINE X 2)  RESP PANEL BY RT-PCR (FLU A&B, COVID) ARPGX2  ETHANOL  AMMONIA  LACTIC ACID, PLASMA  I-STAT CHEM 8, ED  I-STAT VENOUS BLOOD GAS, ED  EKG  EKG Interpretation  Date/Time:  Sunday April 24 2021 05:56:00 EST Ventricular Rate:  96 PR Interval:  177 QRS Duration: 98 QT Interval:  375 QTC Calculation: 474 R Axis:   -46 Text Interpretation: Sinus arrhythmia Paired ventricular premature complexes LAD, consider left anterior fascicular block Left ventricular hypertrophy Confirmed by Addison Lank [id number]) on 04/24/2021 6:35:06 AM       Radiology CT HEAD WO CONTRAST  Result Date: 04/24/2021 CLINICAL DATA:  87 year old male new onset seizure. Became unresponsive. EXAM: CT HEAD WITHOUT CONTRAST TECHNIQUE: Contiguous axial images were obtained from the base of the skull through the vertex without intravenous contrast. COMPARISON:  None. FINDINGS: Brain: Cerebral volume is within normal limits for age. No midline shift, ventriculomegaly, mass effect, evidence of mass lesion, intracranial hemorrhage or evidence of cortically based acute infarction. Mild dystrophic calcifications bilateral basal ganglia. Gray-white matter differentiation within normal limits for age. No cortical  encephalomalacia identified. Vascular: Calcified atherosclerosis at the skull base. No suspicious intracranial vascular hyperdensity. Skull: No fracture identified. Sinuses/Orbits: Visualized paranasal sinuses and mastoids are well aerated. Other: No discrete orbit or scalp soft tissue injury identified. IMPRESSION: Normal for age non contrast CT appearance of the brain. No acute traumatic injury identified. Electronically Signed   By: Genevie Ann M.D.   On: 04/24/2021 07:39    Pertinent labs & imaging results that were available during my care of the patient were reviewed by me and considered in my medical decision making (see MDM for details).  Medications Ordered in ED Medications - No data to display                                                                                                                                   Procedures Procedures  (including critical care time)  Medical Decision Making / ED Course I have reviewed the nursing notes for this encounter and the patient's prior records (if available in EHR or on provided paperwork).  Adrian Olson was evaluated in Emergency Department on 04/24/2021 for the symptoms described in the history of present illness. He was evaluated in the context of the global COVID-19 pandemic, which necessitated consideration that the patient might be at risk for infection with the SARS-CoV-2 virus that causes COVID-19. Institutional protocols and algorithms that pertain to the evaluation of patients at risk for COVID-19 are in a state of rapid change based on information released by regulatory bodies including the CDC and federal and state organizations. These policies and algorithms were followed during the patient's care in the ED.     Loss of consciousness at home Favoring syncope, but will need to rule out seizure (vs stroke). Family at bedside report that patient was seen by cardiology recently for frequent PVCs and noted that he was  orthostatic. He was take off of his HCTZ. No other medication changes. H/o MR sp clips EKG with frequent PVCs. No ischemic changes. Screening  labs  Will also get CT head. Likely needs MRI Plan to admit for further work up and management  Recent fall Right chest and abd wall contusion On record review I saw the patient was seen by his PCP on the eighth and diagnosed with multiple right rib fractures and infiltrate.  He was placed on Norco.  No antibiotics.  Patient only took the Norco once, 2 days ago. Will get CT CAP to assess for other injuries and evidence of PNA. Sats of 88 on RA likely from inflitrate.     Pertinent labs & imaging results that were available during my care of the patient were reviewed by me and considered in my medical decision making:  Patient care turned over to oncoming provider. Patient case and results discussed in detail; please see their note for further ED managment.     Final Clinical Impression(s) / ED Diagnoses Final diagnoses:  Fall     This chart was dictated using voice recognition software.  Despite best efforts to proofread,  errors can occur which can change the documentation meaning.      Fatima Blank, MD 04/24/21 [phone]

## 2021-04-24 NOTE — Consult Note (Signed)
Called to evaluate patient for thoracentesis and/or chest tube placement for evaluation of bilateral pleural effusion. Chart reviewed. Consideration for transudative effusions from heart failure. Also consider hemothorax in the setting of rib fractures. When I went to evaluate patient in the ED he was at MRI. Fine to proceed with IR guided thoracentesis. Please send studies:  Pleural fluid: glucose, LDH, protein, gramstain culture and cytology. Send HCT to evaluate for hemothorax if bloody fluid.   Full PCCM consult to follow tomorrow.   Lenice Llamas, MD [facility] [place] 04/24/2021 5:27 PM Pager: see AMION  If no response to pager, please call critical care on call (see AMION) until 7pm After 7:00 pm call Elink

## 2021-04-24 NOTE — ED Notes (Signed)
PT at bedside.

## 2021-04-24 NOTE — ED Notes (Signed)
The pt fell thanksgiving day he has much bruising rt lower chest and he reports that he has  two fractured ribs  f on the rt

## 2021-04-24 NOTE — H&P (Addendum)
History and Physical    Adrian Olson:[id number] DOB: 11-14-1930 DOA: 04/24/2021  Referring MD/NP/PA: Gareth Morgan, MD PCP: Merrilee Seashore, MD  Patient coming from: Home via EMS  Chief Complaint: Unresponsiveness  I have personally briefly reviewed patient's old medical records in [place]   HPI: Adrian Olson is a 87 y.o. 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  History is obtained from the patient, wife, and daughter who are present at bedside.  Apparently patient has had a steady decline since having a fall during Thanksgiving last month where he bruised the right side of his ribs.  That following Monday on 11/28, he was evaluated by his PCP and subsequently by cardiology who recommended patient discontinue hydrochlorothiazide and possibly Flomax after orthostatic vital signs were noted to be positive.  Family reports chest x-ray at that time did note a small pleural effusion.  Despite patient stopping hydrochlorothiazide he still reports being off balance with getting up and moving around.  Denies that the room spins around him, but he does feel unsteady on his feet.  He has had no subsequent falls, but needs assistance with getting around now and prior could walk a couple miles per day.  This morning the patient had tried to get up to go use the bathroom, but told his wife that he could not get up.  She tried to help him up but patient reportedly completely went out and his legs were stiff, but arms were at the time.  His breathing at that time was very labored and he was never noted to have lost pulse.  Patient denies any significant weight loss, but did note mild lower extremity leg swelling yesterday.  He admits that he has had progressively worsening shortness of breath, being constipated, and had been  eating great at home due to loss of appetite. Denies any significant weight gain or weight loss.  Incidentally patient notes that he had recently worn a Holter monitor for 3 days and just sent it back to the company.  EMS noted patient to be hypoxic down to 85% on room air, but reportedly does not wear oxygen at baseline.   ED Course: On admission into the emergency department patient was seen to be afebrile, pulse 71-85, respiration 14-29, blood pressure 117/92-150/88, and O2 saturations currently maintained on 2 L nasal cannula oxygen.  Labs significant for hemoglobin 11.6, sodium 123->126, CO2 20, BUN 21, creatinine 1.33, AST 114, ALT 110, and HS-trop 79->135.  Urinalysis noted small hemoglobin with 11-20 RBCs, but no significant signs of infection.  CT angiogram of the chest abdomen pelvis noted moderate to large right pleural effusion collection and small moderate left fluid collection with associated bibasilar atelectasis with multiple mildly displaced and slightly communicated fractures at the posterior lateral right ribs 6 through 11 and small right inguinal hernia.  Review of Systems  Constitutional:  Positive for malaise/fatigue. Negative for fever and weight loss.  HENT:  Negative for ear discharge and hearing loss.   Eyes:  Negative for photophobia and pain.  Respiratory:  Positive for shortness of breath.   Cardiovascular:  Positive for leg swelling. Negative for chest pain.  Gastrointestinal:  Positive for constipation. Negative for abdominal pain, nausea and vomiting.  Genitourinary:  Negative for dysuria and frequency.  Musculoskeletal:  Positive for joint pain. Negative for falls.  Skin:  Negative for rash.  Neurological:  Positive for dizziness and weakness. Negative for focal weakness and loss of consciousness.  Psychiatric/Behavioral:  Negative for substance abuse.    Past Medical History:  Diagnosis Date   Bilateral carotid artery disease (HCC)    Severe Left and moderate  Right   Diabetes mellitus without complication [facility])    ED (erectile dysfunction)    Hyperlipidemia    Hypertension    Lumbar disc disease    Mitral valve prolapse    moderate to severe MR, LVEF 55% Echo 2010   Moderate mitral regurgitation 03/16/2014   S/P mitral valve clip implantation 10/09/2019   s/p TEER using 2 MitraClip XTW devices, first device positioned A2 P2, second device positioned A1 P1 by Dr. Burt Knack    Past Surgical History:  Procedure Laterality Date   CARDIAC CATHETERIZATION     MITRAL VALVE REPAIR  10/09/2019   MITRAL VALVE REPAIR (N/A )   MITRAL VALVE REPAIR N/A 10/09/2019   Procedure: MITRAL VALVE REPAIR;  Surgeon: Sherren Mocha, MD;  Location: [place] [facility];  Service: Cardiovascular;  Laterality: N/A;   RIGHT/LEFT HEART CATH AND CORONARY ANGIOGRAPHY N/A 09/16/2019   Procedure: RIGHT/LEFT HEART CATH AND CORONARY ANGIOGRAPHY;  Surgeon: Belva Crome, MD;  Location: [place] [facility];  Service: Cardiovascular;  Laterality: N/A;   TEE WITHOUT CARDIOVERSION N/A 09/16/2019   Procedure: TRANSESOPHAGEAL ECHOCARDIOGRAM (TEE);  Surgeon: Lelon Perla, MD;  Location: [facility] ENDOSCOPY;  Service: Cardiovascular;  Laterality: N/A;     reports that he has never smoked. He has never used smokeless tobacco. He reports that he does not drink alcohol and does not use drugs.  Allergies  Allergen Reactions   Duloxetine Hcl Other (See Comments)    Family History  Problem Relation Age of Onset   Cancer Mother        ovarian   Diabetes Father    Other Father        enlarged prostate   Stroke Father    Heart disease Father    Heart disease Sister    Obesity Sister    Dementia Sister    Diabetes Sister    Other Sister        nornmal old age problems    Prior to Admission medications   Medication Sig Start Date End Date Taking? Authorizing Provider  acetaminophen (TYLENOL) 500 MG tablet Take 1,000 mg by mouth every 6 (six) hours as needed (for pain.).    [provider]  amoxicillin (AMOXIL) 500 MG tablet Take 4 capsules (2,000 mg) one hour prior to all dental visits. 10/15/19   Eileen Stanford, PA-C  aspirin 81 MG EC tablet Take 81 mg by mouth daily.    [provider]  chlordiazePOXIDE (LIBRIUM) 10 MG capsule Take 1 tablet by mouth daily. 07/15/09   [provider]  Cholecalciferol (VITAMIN D) 2000 UNITS tablet Take 2,000 Units by mouth daily at 12 noon.     [provider]  Cyanocobalamin (VITAMIN B-12) 2000 MCG TBCR 1 tablet    [provider]  finasteride (PROSCAR) 5 MG tablet Take 5 mg by mouth daily at 12 noon.     [provider]  levothyroxine (SYNTHROID, LEVOTHROID) 75 MCG tablet Take 75 mcg by mouth daily before breakfast.    [provider]  metFORMIN (GLUCOPHAGE) 500 MG tablet Take 500 mg by mouth every evening.  04/23/16   [provider]  pravastatin (PRAVACHOL) 20 MG tablet Take 20  mg by mouth at bedtime.     [provider]  psyllium (METAMUCIL) 58.6 % packet Take 1 packet by mouth daily.    [provider]  ramipril (ALTACE) 5 MG capsule Take 5 mg by mouth daily.     [provider]  sodium chloride (OCEAN) 0.65 % SOLN nasal spray Place 1 spray into both nostrils daily as needed for congestion.    [provider]  tamsulosin (FLOMAX) 0.4 MG CAPS capsule Take 0.4 mg by mouth daily at 12 noon.  04/13/18   [provider]  Thiamine HCl (VITAMIN B1 PO) Take 125 mcg by mouth daily.    [provider]    Physical Exam:  Constitutional: Elderly male who appears chronically ill but able to follow commands Vitals:   04/24/21 0730 04/24/21 0745 04/24/21 0800 04/24/21 0845  BP: (!) 117/92 139/74 (!) 149/67 138/81  Pulse: 85 79 76 71  Resp: (!) 29 14 18 20   Temp:      TempSrc:      SpO2: 96% 96% 96% 98%   Eyes: PERRL, lids and conjunctivae normal ENMT: Mucous membranes are moist. Posterior pharynx clear of any  exudate or lesions.  Neck: normal, supple, no masses, no thyromegaly Respiratory: Decreased breath sounds noted in the lower lung fields with no significant wheezes or rhonchi appreciated. Cardiovascular: Regular rate and rhythm.  No significant lower extremity edema appreciated at this time tenderness palpation right mid to lower rib cage. Abdomen: no tenderness, no masses palpated. No hepatosplenomegaly. Bowel sounds positive.  Musculoskeletal: no clubbing / cyanosis. No joint deformity upper and lower extremities. Good ROM, no contractures. Normal muscle tone.  Skin: Significant bruising noted of the right lower rib cage and right flank.   Neurologic: CN 2-12 grossly intact. Sensation intact, DTR normal.  Unsteady gait appreciated with home patient walking in to the room needing assistance on both sides. Psychiatric: Normal judgment and insight. Alert and oriented x 3. Normal mood.     Labs on Admission: I have personally reviewed following labs and imaging studies  CBC: Recent Labs  Lab 04/24/21 0555 04/24/21 0747 04/24/21 0748  WBC 8.2  --   --   NEUTROABS 5.9  --   --   HGB 11.6* 11.6* 12.2*  HCT 34.8* 34.0* 36.0*  MCV 97.2  --   --   PLT 247  --   --    Basic Metabolic Panel: Recent Labs  Lab 04/24/21 0555 04/24/21 0747 04/24/21 0748  NA 123* 127* 126*  K 4.5 4.5 4.5  CL 92*  --  94*  CO2 20*  --   --   GLUCOSE 178*  --  140*  BUN 21  --  22  CREATININE 1.33*  --  1.20  CALCIUM 9.1  --   --    GFR: Estimated Creatinine Clearance: 39.7 mL/min (by C-G formula based on SCr of 1.2 mg/dL). Liver Function Tests: Recent Labs  Lab 04/24/21 0555  AST 114*  ALT 110*  ALKPHOS 116  BILITOT 0.7  PROT 6.5  ALBUMIN 3.5   No results for input(s): LIPASE, AMYLASE in the last 168 hours. No results for input(s): AMMONIA in the last 168 hours. Coagulation Profile: No results for input(s): INR, PROTIME in the last 168 hours. Cardiac Enzymes: No results for input(s):  CKTOTAL, CKMB, CKMBINDEX, TROPONINI in the last 168 hours. BNP (last 3 results) No results for input(s): PROBNP in the last 8760 hours. HbA1C: No results for input(s): HGBA1C  in the last 72 hours. CBG: Recent Labs  Lab 04/24/21 0630  GLUCAP 154*   Lipid Profile: No results for input(s): CHOL, HDL, LDLCALC, TRIG, CHOLHDL, LDLDIRECT in the last 72 hours. Thyroid Function Tests: No results for input(s): TSH, T4TOTAL, FREET4, T3FREE, THYROIDAB in the last 72 hours. Anemia Panel: No results for input(s): VITAMINB12, FOLATE, FERRITIN, TIBC, IRON, RETICCTPCT in the last 72 hours. Urine analysis:    Component Value Date/Time   COLORURINE YELLOW 04/24/2021 0624   APPEARANCEUR CLEAR 04/24/2021 0624   LABSPEC 1.014 04/24/2021 0624   PHURINE 6.0 04/24/2021 0624   GLUCOSEU NEGATIVE 04/24/2021 0624   HGBUR SMALL (A) 04/24/2021 0624   BILIRUBINUR NEGATIVE 04/24/2021 0624   KETONESUR NEGATIVE 04/24/2021 0624   PROTEINUR 30 (A) 04/24/2021 0624   NITRITE NEGATIVE 04/24/2021 0624   LEUKOCYTESUR NEGATIVE 04/24/2021 0624   Sepsis Labs: No results found for this or any previous visit (from the past 240 hour(s)).   Radiological Exams on Admission: CT HEAD WO CONTRAST  Result Date: 04/24/2021 CLINICAL DATA:  87 year old male new onset seizure. Became unresponsive. EXAM: CT HEAD WITHOUT CONTRAST TECHNIQUE: Contiguous axial images were obtained from the base of the skull through the vertex without intravenous contrast. COMPARISON:  None. FINDINGS: Brain: Cerebral volume is within normal limits for age. No midline shift, ventriculomegaly, mass effect, evidence of mass lesion, intracranial hemorrhage or evidence of cortically based acute infarction. Mild dystrophic calcifications bilateral basal ganglia. Gray-white matter differentiation within normal limits for age. No cortical encephalomalacia identified. Vascular: Calcified atherosclerosis at the skull base. No suspicious intracranial vascular  hyperdensity. Skull: No fracture identified. Sinuses/Orbits: Visualized paranasal sinuses and mastoids are well aerated. Other: No discrete orbit or scalp soft tissue injury identified. IMPRESSION: Normal for age non contrast CT appearance of the brain. No acute traumatic injury identified. Electronically Signed   By: Genevie Ann M.D.   On: 04/24/2021 07:39   CT Angio Chest PE W and/or Wo Contrast  Result Date: 04/24/2021 CLINICAL DATA:  Possible pulmonary embolism. Syncope at home when getting up to go to the bathroom. EXAM: CT ANGIOGRAPHY CHEST CT ABDOMEN AND PELVIS WITH CONTRAST TECHNIQUE: Multidetector CT imaging of the chest was performed using the standard protocol during bolus administration of intravenous contrast. Multiplanar CT image reconstructions and MIPs were obtained to evaluate the vascular anatomy. Multidetector CT imaging of the abdomen and pelvis was performed using the standard protocol during bolus administration of intravenous contrast. CONTRAST:  195mL OMNIPAQUE IOHEXOL 350 MG/ML SOLN COMPARISON:  CT abdomen/pelvis 10/08/2008 FINDINGS: CTA CHEST FINDINGS Cardiovascular: Mild-to-moderate cardiomegaly. Prosthetic mitral valve noted. Mild calcified plaque over the left main and right coronary arteries. Ascending thoracic aorta measures approximately 3.6 cm in AP diameter. Calcified plaque over the descending thoracic aorta. Pulmonary arterial system is well opacified and demonstrates no evidence of emboli. Mediastinum/Nodes: No definite mediastinal or hilar adenopathy. Remaining mediastinal structures are unremarkable. Lungs/Pleura: Moderate moderate to large right pleural fluid collection and small to moderate left pleural fluid collection with associated atelectatic change within the lung bases. No pneumothorax. Airways are unremarkable. Musculoskeletal: Numerous right posterior and lateral rib fractures involving ribs 6 through 11. Review of the MIP images confirms the above findings. CT  ABDOMEN and PELVIS FINDINGS Hepatobiliary: Liver and biliary tree are normal. Tiny amount of adjacent pericholecystic fluid as the gallbladder is unremarkable. Pancreas: Normal. Spleen: Normal. Adrenals/Urinary Tract: Adrenal glands are normal. Kidneys are normal in size without focal mass or hydronephrosis. Ureters and bladder are normal. Stomach/Bowel: Stomach and small bowel  are normal. Diverticulosis of the colon with mild fecal retention throughout the colon. Appendix not visualized. Vascular/Lymphatic: Mild-to-moderate calcified plaque over the abdominal aorta which is normal in caliber. No adenopathy. Reproductive: Mild prostatic enlargement with moderate pressure upon the bladder base. Other: No significant free peritoneal fluid and no free peritoneal air. Small right inguinal hernia containing peritoneal fat and tiny amount of fluid. Musculoskeletal: Degenerative change of the spine. No acute fracture. Review of the MIP images confirms the above findings. IMPRESSION: 1. No evidence of pulmonary embolism. 2. Moderate to large right pleural fluid collection and small to moderate left pleural fluid collection with associated bibasilar atelectasis. No pneumothorax. Multiple associated mildly displaced and slightly comminuted fractures of posterolateral right ribs 6 through 11. 3. No acute findings in the abdomen/pelvis. 4. Colonic diverticulosis without active inflammation. 5. Mild prostatic enlargement with moderate impression upon the bladder base. 6. Small right inguinal hernia containing peritoneal fat and tiny amount of fluid. 7. Aortic atherosclerosis. Atherosclerotic coronary artery disease. Aortic Atherosclerosis (ICD10-I70.0). Electronically Signed   By: Marin Olp M.D.   On: 04/24/2021 09:09   CT ABDOMEN PELVIS W CONTRAST  Result Date: 04/24/2021 CLINICAL DATA:  Possible pulmonary embolism. Syncope at home when getting up to go to the bathroom. EXAM: CT ANGIOGRAPHY CHEST CT ABDOMEN AND PELVIS  WITH CONTRAST TECHNIQUE: Multidetector CT imaging of the chest was performed using the standard protocol during bolus administration of intravenous contrast. Multiplanar CT image reconstructions and MIPs were obtained to evaluate the vascular anatomy. Multidetector CT imaging of the abdomen and pelvis was performed using the standard protocol during bolus administration of intravenous contrast. CONTRAST:  162mL OMNIPAQUE IOHEXOL 350 MG/ML SOLN COMPARISON:  CT abdomen/pelvis 10/08/2008 FINDINGS: CTA CHEST FINDINGS Cardiovascular: Mild-to-moderate cardiomegaly. Prosthetic mitral valve noted. Mild calcified plaque over the left main and right coronary arteries. Ascending thoracic aorta measures approximately 3.6 cm in AP diameter. Calcified plaque over the descending thoracic aorta. Pulmonary arterial system is well opacified and demonstrates no evidence of emboli. Mediastinum/Nodes: No definite mediastinal or hilar adenopathy. Remaining mediastinal structures are unremarkable. Lungs/Pleura: Moderate moderate to large right pleural fluid collection and small to moderate left pleural fluid collection with associated atelectatic change within the lung bases. No pneumothorax. Airways are unremarkable. Musculoskeletal: Numerous right posterior and lateral rib fractures involving ribs 6 through 11. Review of the MIP images confirms the above findings. CT ABDOMEN and PELVIS FINDINGS Hepatobiliary: Liver and biliary tree are normal. Tiny amount of adjacent pericholecystic fluid as the gallbladder is unremarkable. Pancreas: Normal. Spleen: Normal. Adrenals/Urinary Tract: Adrenal glands are normal. Kidneys are normal in size without focal mass or hydronephrosis. Ureters and bladder are normal. Stomach/Bowel: Stomach and small bowel are normal. Diverticulosis of the colon with mild fecal retention throughout the colon. Appendix not visualized. Vascular/Lymphatic: Mild-to-moderate calcified plaque over the abdominal aorta which  is normal in caliber. No adenopathy. Reproductive: Mild prostatic enlargement with moderate pressure upon the bladder base. Other: No significant free peritoneal fluid and no free peritoneal air. Small right inguinal hernia containing peritoneal fat and tiny amount of fluid. Musculoskeletal: Degenerative change of the spine. No acute fracture. Review of the MIP images confirms the above findings. IMPRESSION: 1. No evidence of pulmonary embolism. 2. Moderate to large right pleural fluid collection and small to moderate left pleural fluid collection with associated bibasilar atelectasis. No pneumothorax. Multiple associated mildly displaced and slightly comminuted fractures of posterolateral right ribs 6 through 11. 3. No acute findings in the abdomen/pelvis. 4. Colonic diverticulosis without active  inflammation. 5. Mild prostatic enlargement with moderate impression upon the bladder base. 6. Small right inguinal hernia containing peritoneal fat and tiny amount of fluid. 7. Aortic atherosclerosis. Atherosclerotic coronary artery disease. Aortic Atherosclerosis (ICD10-I70.0). Electronically Signed   By: Marin Olp M.D.   On: 04/24/2021 09:09    EKG: Independently reviewed.  Sinus arrhythmia at 96 bpm with paired PVCs and LVH  Assessment/Plan  Acute respiratory failure with hypoxia secondary to pleural effusion: EMS noted patient to be hypoxic down to 85% on room air, but normally does not require oxygen.  Currently on 2 L of nasal cannula oxygen with O2 saturations maintained.  Imaging significant for large right-sided pleural effusion with small to moderate left-sided pleural effusion and associated bibasilar atelectasis.  Trauma surgery have been consulted.  Question if secondary to fluid overload. -Admit to a telemetry bed -Continuous pulse oximetry with nasal cannula oxygen maintain O2 saturation greater 92% -IR guided paracentesis if PCCM unable to perform today. -Appreciate trauma surgery, will  follow-up for any further recommendations  Episode of unresponsiveness  Patient presents after an episode of unresponsiveness where he was noted to have stiffening of the legs and become responsive.  Question convulsive syncope versus seizure versus arrhythmia.  Of note patient did have a Holter monitor in place -Neurochecks -Seizure precautions -Follow-up telemetry overnight for arrhythmia -Report of Zio may be available with his cardiologist Dr. Daneen Schick tomorrow  Diastolic congestive heart failure with mitral valve prolapse, MR, and TR : Suspected acute on chronic.  Patient's last echocardiogram revealed EF of 55 to 60% with moderate to severe mitral valve regurgitation and moderate to severe tricuspid regurgitation.  The mitral clip procedure patient had undergone back in 2021 was thought to be not as successful.  Question if increased pleural effusion is related with hydrochlorothiazide being discontinued after patient noted to have orthostatic hypotension. -Strict I&Os and daily weight -Add-on BNP -Lasix IV as seen above -Follow-up echocardiogram to see if any significant change -Formally consult Cardiology if warrented  Elevated troponin: Acute.  High-sensitivity troponin 79->135.  Patient denied any complaints of chest pain besides where he previously had fallen. -F/u echo  Dizziness orthostatic hypotension: Patient has been reporting being off balance since his fall Thanksgiving.  Symptoms previously thought to be associated with orthostatic hypotension.  CT scan of the head negative for any acute abnormality. -Check orthostatic vital sign -Check MRI of the brain rule out possibility of stroke -PT/OT to evaluate and and treat -May warrant trial of abdominal binder in addition to compression stockings if orthostatic hypotension still appreciated  Hyponatremia: Acute on chronic.  Patient presents with sodium level 123-> 127-> 126.  Question hypervolemic hyponatremia.   -Check  urine sodium and osmolarity -Serial monitoring of BMP -Goal  correction no more than 10 mmol/L and 24-hour.  Renal insufficiency: Acute.  Creatinine elevated up to 1.33 with BUN 23.  Baseline creatinine previously been around 1-1.1.  This does not appear to be greater than 0.3 increased from baseline range. -Check CK -Monitor kidney function diuresis  Essential hypertension: Blood pressures currently stable.  Home medications include ramipril 5 mg daily. -Resume ramipril medically appropriate  Transaminitis: Acute.  AST 114 and ALT 110.  Question if secondary to passive congestion -Check CK -Check ammonia level and hepatitis  Hypothyroidism -Add on TSH -Continue levothyroxine.  Adjust dose if needed  Right inguinal hernia: Incidental finding on CT of the abdomen and pelvis for -Recommend outpatient follow-up with general surgery  Hyperlipidemia: Home medications include pravastatin  20 mg nightly. -Continue statin, but monitor liver function study  DVT prophylaxis: Lovenox Code Status: Full Family Communication: Family updated at bedside Disposition Plan: To be determined Consults called: Trauma surgery, PCCM Admission status: Inpatient, require more than 2 midnight stay  Norval Morton MD [facility]   If 7PM-7AM, please contact night-coverage   04/24/2021, 11:33 AM

## 2021-04-24 NOTE — Evaluation (Signed)
Physical Therapy Evaluation Patient Details Name: Adrian Olson MRN: [id number] DOB: 10/30/1930 Today's Date: 04/24/2021  History of Present Illness  87 year old male who presented to the [facility] emergency department 04/24/21 via EMS after having an episode of unresponsiveness at home this morning.  CT angiogram of chest abdomen pelvis with large right pleural effusion collection and small moderate left fluid collection with associated bibasilar atelectasis and multiple mildly displaced and slightly comminuted rib fractures at the posterior lateral right ribs 6 through 11. Admitted for treatment of Moderate to large R pleural effusion PMH: hypertension, hyperlipidemia, MVP with MR s/p mitral valve clip, carotid artery stenosis, DM type II  Clinical Impression  PTA pt living with wife in single story town home with 1 step to enter. Pt reports walking 3 miles per day, driving, and taking care of all ADLs independently. Pt limited in safe mobility by thoracic spine and R rib pain, in presence of decreased strength, balance and endurance. Pt is supervision for bed mobility and min guard for transfers and ambulation without AD. Pt trialed RW use and had more instability with RW than without. PT recommending HHPT at discharge to work on balance and regaining endurance. PT will continue to follow acutely.        Recommendations for follow up therapy are one component of a multi-disciplinary discharge planning process, led by the attending physician.  Recommendations may be updated based on patient status, additional functional criteria and insurance authorization.  Follow Up Recommendations Home health PT    Assistance Recommended at Discharge Frequent or constant Supervision/Assistance  Functional Status Assessment Patient has had a recent decline in their functional status and demonstrates the ability to make significant improvements in function in a reasonable and predictable amount of time.   Equipment Recommendations  None recommended by PT    Recommendations for Other Services OT consult     Precautions / Restrictions Precautions Precautions: Fall Precaution Comments: R rib 6-11 fx Restrictions Weight Bearing Restrictions: No      Mobility  Bed Mobility Overal bed mobility: Needs Assistance Bed Mobility: Supine to Sit;Sit to Supine     Supine to sit: Supervision Sit to supine: Supervision   General bed mobility comments: supervision for safety with navigation of ED stretcher    Transfers Overall transfer level: Needs assistance Equipment used: Rollator (4 wheels) Transfers: Sit to/from Stand Sit to Stand: Min guard           General transfer comment: min guard for safety, vc for hand placement for power up,    Ambulation/Gait Ambulation/Gait assistance: Min guard Gait Distance (Feet): 50 Feet Assistive device: Rolling walker (2 wheels) Gait Pattern/deviations: Step-through pattern;Decreased step length - right;Decreased step length - left;Shuffle;Trunk flexed Gait velocity: decreasd Gait velocity interpretation: <1.31 ft/sec, indicative of household ambulator   General Gait Details: pt unfamiliar with RW usage, vc for sequencing however, pt obviously uncomfortable with use, ambulated without RW with improved stability, however continues to have mild instability no overt LoB         Balance Overall balance assessment: Mild deficits observed, not formally tested                                           Pertinent Vitals/Pain Pain Assessment: Faces Faces Pain Scale: Hurts even more Pain Location: thoracic spine and shoulders Pain Descriptors / Indicators: Aching;Sore;Throbbing Pain Intervention(s): Limited activity within  patient's tolerance;Monitored during session;Repositioned    Home Living Family/patient expects to be discharged to:: Private residence Living Arrangements: Spouse/significant other Available Help at  Discharge: Family;Available 24 hours/day Type of Home: House Home Access: Stairs to enter   [employer] of Steps: 1   Home Layout: One level Home Equipment: Cane - single point;Shower seat      Prior Function Prior Level of Function : Independent/Modified Independent             Mobility Comments: reports walking 3 miles a day ADLs Comments: independent with ADLs and drives     Hand Dominance        Extremity/Trunk Assessment   Upper Extremity Assessment Upper Extremity Assessment: Defer to OT evaluation    Lower Extremity Assessment Lower Extremity Assessment: Overall WFL for tasks assessed    Cervical / Trunk Assessment Cervical / Trunk Assessment: Other exceptions (large bruising to R flank and back)  Communication   Communication: No difficulties  Cognition Arousal/Alertness: Awake/alert Behavior During Therapy: WFL for tasks assessed/performed Overall Cognitive Status: Within Functional Limits for tasks assessed                                          General Comments General comments (skin integrity, edema, etc.): Brusing on R flank, VSS on RA 3/4 DoE at end of ambulation        Assessment/Plan    PT Assessment Patient needs continued PT services  PT Problem List Decreased strength;Decreased activity tolerance;Decreased balance;Decreased mobility;Pain       PT Treatment Interventions DME instruction;Gait training;Stair training;Functional mobility training;Therapeutic activities;Therapeutic exercise;Balance training;Cognitive remediation;Patient/family education    PT Goals (Current goals can be found in the Care Plan section)  Acute Rehab PT Goals Patient Stated Goal: find out what's wrong PT Goal Formulation: With patient/family Time For Goal Achievement: 05/08/21 Potential to Achieve Goals: Good    Frequency Min 3X/week    AM-PAC PT "6 Clicks" Mobility  Outcome Measure Help needed turning from your back to  your side while in a flat bed without using bedrails?: None Help needed moving from lying on your back to sitting on the side of a flat bed without using bedrails?: None Help needed moving to and from a bed to a chair (including a wheelchair)?: A Little Help needed standing up from a chair using your arms (e.g., wheelchair or bedside chair)?: A Little Help needed to walk in hospital room?: A Little Help needed climbing 3-5 steps with a railing? : A Lot 6 Click Score: 19    End of Session Equipment Utilized During Treatment: Gait belt Activity Tolerance: Patient tolerated treatment well Patient left: in bed;with call bell/phone within reach;with family/visitor present Nurse Communication: Mobility status;Other (comment) (need for condom cath to be replaced) PT Visit Diagnosis: Unsteadiness on feet (R26.81);Other abnormalities of gait and mobility (R26.89);Repeated falls (R29.6);Muscle weakness (generalized) (M62.81);History of falling (Z91.81);Pain Pain - part of body:  (back)    Time: 0109-3235 PT Time Calculation (min) (ACUTE ONLY): 21 min   Charges:   PT Evaluation $PT Eval Moderate Complexity: 1 Mod          Kiyanna Biegler B. Migdalia Dk PT, DPT Acute Rehabilitation Services Pager [phone] Office [phone]   [place] 04/24/2021, 5:17 PM

## 2021-04-24 NOTE — ED Notes (Signed)
Pt ambulated to restroom with this RN and Nurse Tech. Pt required contact-guard assistance to get to restroom.

## 2021-04-24 NOTE — ED Triage Notes (Signed)
Patient is from home, got up to go to the bathroom, was not able to get up and became stiff/tense and unresponsive.  Patient was incontinent of urine after the episode.  Patient has some oral trauma on tongue.  No history of seizures.  Patient was pale, cool and diaphoretic and slow to respond upon EMS arrival.  Patient with crackles left lower lobe.  Patient fell three weeks ago, did not come to hospital but does have bruising to right lower flank.  Patient is now CAOx4.

## 2021-04-25 ENCOUNTER — Inpatient Hospital Stay (HOSPITAL_COMMUNITY): Payer: Medicare HMO

## 2021-04-25 DIAGNOSIS — I951 Orthostatic hypotension: Secondary | ICD-10-CM

## 2021-04-25 DIAGNOSIS — J939 Pneumothorax, unspecified: Secondary | ICD-10-CM

## 2021-04-25 DIAGNOSIS — I6523 Occlusion and stenosis of bilateral carotid arteries: Secondary | ICD-10-CM

## 2021-04-25 DIAGNOSIS — Z9889 Other specified postprocedural states: Secondary | ICD-10-CM | POA: Diagnosis not present

## 2021-04-25 DIAGNOSIS — R55 Syncope and collapse: Secondary | ICD-10-CM

## 2021-04-25 DIAGNOSIS — E119 Type 2 diabetes mellitus without complications: Secondary | ICD-10-CM

## 2021-04-25 DIAGNOSIS — I493 Ventricular premature depolarization: Secondary | ICD-10-CM | POA: Diagnosis not present

## 2021-04-25 DIAGNOSIS — J90 Pleural effusion, not elsewhere classified: Secondary | ICD-10-CM | POA: Diagnosis not present

## 2021-04-25 DIAGNOSIS — I5032 Chronic diastolic (congestive) heart failure: Secondary | ICD-10-CM | POA: Diagnosis not present

## 2021-04-25 LAB — BASIC METABOLIC PANEL
Anion gap: 10 (ref 5–15)
Anion gap: 12 (ref 5–15)
BUN: 21 mg/dL (ref 8–23)
BUN: 23 mg/dL (ref 8–23)
CO2: 20 mmol/L — ABNORMAL LOW (ref 22–32)
CO2: 22 mmol/L (ref 22–32)
Calcium: 9 mg/dL (ref 8.9–10.3)
Calcium: 9.2 mg/dL (ref 8.9–10.3)
Chloride: 94 mmol/L — ABNORMAL LOW (ref 98–111)
Chloride: 94 mmol/L — ABNORMAL LOW (ref 98–111)
Creatinine, Ser: 1.2 mg/dL (ref 0.61–1.24)
Creatinine, Ser: 1.22 mg/dL (ref 0.61–1.24)
GFR, Estimated: 57 mL/min — ABNORMAL LOW (ref >60)
GFR, Estimated: 58 mL/min — ABNORMAL LOW (ref >60)
Glucose, Bld: 145 mg/dL — ABNORMAL HIGH (ref 70–99)
Glucose, Bld: 181 mg/dL — ABNORMAL HIGH (ref 70–99)
Potassium: 3.8 mmol/L (ref 3.5–5.1)
Potassium: 4 mmol/L (ref 3.5–5.1)
Sodium: 126 mmol/L — ABNORMAL LOW (ref 135–145)
Sodium: 126 mmol/L — ABNORMAL LOW (ref 135–145)

## 2021-04-25 LAB — CBC
HCT: 34 % — ABNORMAL LOW (ref 39.0–52.0)
Hemoglobin: 11.7 g/dL — ABNORMAL LOW (ref 13.0–17.0)
MCH: 32.7 pg (ref 26.0–34.0)
MCHC: 34.4 g/dL (ref 30.0–36.0)
MCV: 95 fL (ref 80.0–100.0)
Platelets: 251 10*3/uL (ref 150–400)
RBC: 3.58 MIL/uL — ABNORMAL LOW (ref 4.22–5.81)
RDW: 13.5 % (ref 11.5–15.5)
WBC: 8 10*3/uL (ref 4.0–10.5)
nRBC: 0 % (ref 0.0–0.2)

## 2021-04-25 LAB — MAGNESIUM: Magnesium: 1.8 mg/dL (ref 1.7–2.4)

## 2021-04-25 LAB — HEMOGLOBIN AND HEMATOCRIT, BLOOD
HCT: 34.8 % — ABNORMAL LOW (ref 39.0–52.0)
HCT: 32.5 % — ABNORMAL LOW (ref 39.0–52.0)
Hemoglobin: 11.7 g/dL — ABNORMAL LOW (ref 13.0–17.0)
Hemoglobin: 11 g/dL — ABNORMAL LOW (ref 13.0–17.0)

## 2021-04-25 LAB — BODY FLUID CELL COUNT WITH DIFFERENTIAL
Eos, Fluid: 0 %
Lymphs, Fluid: 35 %
Monocyte-Macrophage-Serous Fluid: 12 % — ABNORMAL LOW (ref 50–90)
Neutrophil Count, Fluid: 53 % — ABNORMAL HIGH (ref 0–25)
Total Nucleated Cell Count, Fluid: 1625 uL — ABNORMAL HIGH (ref 0–1000)

## 2021-04-25 LAB — PROTEIN, PLEURAL OR PERITONEAL FLUID: Total protein, fluid: <3 g/dL

## 2021-04-25 LAB — TYPE AND SCREEN
ABO/RH(D): A POS
Antibody Screen: NEGATIVE

## 2021-04-25 LAB — LACTATE DEHYDROGENASE: LDH: 260 U/L — ABNORMAL HIGH (ref 98–192)

## 2021-04-25 LAB — LACTATE DEHYDROGENASE, PLEURAL OR PERITONEAL FLUID: LD, Fluid: 114 U/L — ABNORMAL HIGH (ref 3–23)

## 2021-04-25 LAB — GLUCOSE, PLEURAL OR PERITONEAL FLUID: Glucose, Fluid: 181 mg/dL

## 2021-04-25 LAB — LACTIC ACID, PLASMA
Lactic Acid, Venous: 1.3 mmol/L (ref 0.5–1.9)
Lactic Acid, Venous: 2.5 mmol/L (ref 0.5–1.9)

## 2021-04-25 LAB — TSH: TSH: 9.172 u[IU]/mL — ABNORMAL HIGH (ref 0.350–4.500)

## 2021-04-25 MED ORDER — ADENOSINE 6 MG/2ML IV SOLN
6.0000 mg | Freq: Once | INTRAVENOUS | Status: AC
  Administered 2021-04-25: 6 mg via INTRAVENOUS
  Filled 2021-04-25: qty 2

## 2021-04-25 MED ORDER — ASPIRIN EC 81 MG PO TBEC
81.0000 mg | DELAYED_RELEASE_TABLET | Freq: Every day | ORAL | Status: DC
  Administered 2021-04-25 – 2021-04-26 (×2): 81 mg via ORAL
  Filled 2021-04-25 (×3): qty 1

## 2021-04-25 MED ORDER — METOPROLOL TARTRATE 12.5 MG HALF TABLET
12.5000 mg | ORAL_TABLET | Freq: Two times a day (BID) | ORAL | Status: DC
  Administered 2021-04-25 – 2021-04-26 (×3): 12.5 mg via ORAL
  Filled 2021-04-25 (×3): qty 1

## 2021-04-25 MED ORDER — ADENOSINE 6 MG/2ML IV SOLN
12.0000 mg | Freq: Once | INTRAVENOUS | Status: AC
  Administered 2021-04-25: 12 mg via INTRAVENOUS

## 2021-04-25 MED ORDER — FINASTERIDE 5 MG PO TABS
5.0000 mg | ORAL_TABLET | Freq: Every day | ORAL | Status: DC
  Administered 2021-04-25 – 2021-04-26 (×2): 5 mg via ORAL
  Filled 2021-04-25 (×2): qty 1

## 2021-04-25 MED ORDER — HYDROCODONE-ACETAMINOPHEN 7.5-325 MG PO TABS
1.0000 | ORAL_TABLET | Freq: Every day | ORAL | Status: DC | PRN
  Administered 2021-04-25: 1 via ORAL
  Filled 2021-04-25: qty 1

## 2021-04-25 MED ORDER — TAMSULOSIN HCL 0.4 MG PO CAPS
0.4000 mg | ORAL_CAPSULE | Freq: Every day | ORAL | Status: DC
  Administered 2021-04-25 – 2021-04-26 (×2): 0.4 mg via ORAL
  Filled 2021-04-25 (×2): qty 1

## 2021-04-25 MED ORDER — PRAVASTATIN SODIUM 40 MG PO TABS
20.0000 mg | ORAL_TABLET | Freq: Every day | ORAL | Status: DC
  Administered 2021-04-25: 20 mg via ORAL
  Filled 2021-04-25: qty 1

## 2021-04-25 MED ORDER — METOPROLOL TARTRATE 5 MG/5ML IV SOLN
5.0000 mg | Freq: Once | INTRAVENOUS | Status: AC
  Administered 2021-04-25: 5 mg via INTRAVENOUS
  Filled 2021-04-25: qty 5

## 2021-04-25 MED ORDER — PSYLLIUM 95 % PO PACK
1.0000 | PACK | Freq: Every day | ORAL | Status: DC
  Administered 2021-04-25 – 2021-04-26 (×2): 1 via ORAL
  Filled 2021-04-25 (×2): qty 1

## 2021-04-25 MED ORDER — LIDOCAINE HCL (PF) 1 % IJ SOLN
5.0000 mL | Freq: Once | INTRAMUSCULAR | Status: AC
  Administered 2021-04-25: 5 mL via INTRADERMAL
  Filled 2021-04-25: qty 30

## 2021-04-25 MED ORDER — ADENOSINE 6 MG/2ML IV SOLN
INTRAVENOUS | Status: AC
  Filled 2021-04-25: qty 2

## 2021-04-25 MED ORDER — TRAMADOL HCL 50 MG PO TABS: 50.0000 mg | ORAL_TABLET | Freq: Two times a day (BID) | ORAL | Status: DC | PRN

## 2021-04-25 NOTE — Procedures (Signed)
Thoracentesis  Procedure Note  Adrian Olson  [id number]  1930-09-19  Date:04/25/21  Time:11:56 AM   Provider Performing:Jamier Urbas R Nishka Heide   Procedure: Thoracentesis with imaging guidance (20721)  Indication(s) Pleural Effusion  Consent Risks of the procedure as well as the alternatives and risks of each were explained to the patient and/or caregiver.  Consent for the procedure was obtained and is signed in the bedside chart  Anesthesia Topical only with 1% lidocaine    Time Out Verified patient identification, verified procedure, site/side was marked, verified correct patient position, special equipment/implants available, medications/allergies/relevant history reviewed, required imaging and test results available.   Sterile Technique Maximal sterile technique including full sterile barrier drape, hand hygiene, sterile gown, sterile gloves, mask, hair covering, sterile ultrasound probe cover (if used).  Procedure Description Ultrasound was used to identify appropriate pleural anatomy for placement and overlying skin marked.  Area of drainage cleaned and draped in sterile fashion. Lidocaine was used to anesthetize the skin and subcutaneous tissue.  1400 cc's of bloody appearing fluid was drained from the right pleural space. Catheter then removed and bandaid applied to site.   Complications/Tolerance None; patient tolerated the procedure well. Chest X-ray is ordered to confirm no post-procedural complication.   EBL Minimal   Specimen(s) Pleural fluid     Otilio Carpen Kensley Lares, PA-C

## 2021-04-25 NOTE — Consult Note (Addendum)
NAME:  ALECXIS Olson, MRN:  [id number], DOB:  1930-12-10, LOS: 1 ADMISSION DATE:  04/24/2021, CONSULTATION DATE:  04/25/21 REFERRING MD:  Cruzita Lederer, CHIEF COMPLAINT:  Fall, dyspnea   History of Present Illness:  Adrian Olson is a 87 y.o. M with PMH of MVP and MR with mitral valve clop, T2DM, multiple falls, HL, HTN, carotid artery disease who presented on 12/11 after he had an episode of unresponsiveness and fall at home.  He has recently been noted to be orthostatic and cardiology had discontinued HCTZ.   He was alert in the ED and trauma work-up notable for multiple mildly displaced and slightly communicated fractures at the posterior lateral right ribs 6 through 11 with bilateral pleural effusions moderate to large on the R.  He was admitted and PCCM consulted for possible thoracentesis  Pertinent  Medical History   has a past medical history of Bilateral carotid artery disease ([place]), Diabetes mellitus without complication ([place]), ED (erectile dysfunction), Hyperlipidemia, Hypertension, Lumbar disc disease, Mitral valve prolapse, Moderate mitral regurgitation (03/16/2014), and S/P mitral valve clip implantation (10/09/2019).   Significant Hospital Events: Including procedures, antibiotic start and stop dates in addition to other pertinent events   12/11 Admitted to [facility] after fall with rib fx's and pleural effusion 12/12 Thoracentesis, PCCM consult  Interim History / Subjective:  Pt had possible SVT overnight, responded briefly to adenosine and was given beta blockers.  In SR this morning  Objective   Blood pressure (!) 158/84, pulse 61, temperature 97.9 F (36.6 C), temperature source Rectal, resp. rate (!) 22, height 5\' 9"  (1.753 m), weight 65.8 kg, SpO2 98 %.        Intake/Output Summary (Last 24 hours) at 04/25/2021 1003 Last data filed at 04/25/2021 0224 Gross per 24 hour  Intake --  Output 2800 ml  Net -2800 ml   Filed Weights   04/24/21 1246  Weight: 65.8 kg    General:   thin, elderly M sitting up and in no distress HEENT: MM pink/moist, sclera anicteric  Neuro: awake and alert, no focal deficits, speech clear CV: s1s2 rrr, no m/r/g PULM:  decreased air entry bilateral bases, no rhonchi or wheezing, no accessory muscle use on nasal cannula GI: soft, bsx4 active  Extremities: warm/dry, no edema  Skin: no rashes or lesions   Resolved Hospital Problem list     Assessment & Plan:    Acute Hypoxic respiratory failure likely secondary to hemothorax in the setting of traumatic R fib fractures  Trauma surgery following -s/p thoracentesis with approx 1400cc bloody fluid removed -pleural fluid gram stain/culture, cytology, glucose, LDH, protein pending -continue nasal cannula O2 to maintain sats >92% -consult CT surgery as patient as risk for persistent hemothorax after displaced rib fractures -type and screen and monitor h/h q4hrs -continue IS  -mobilize as able     Best Practice (right click and "Reselect all SmartList Selections" daily)   Per primary  Labs   CBC: Recent Labs  Lab 04/24/21 0555 04/24/21 0747 04/24/21 0748 04/25/21 0327  WBC 8.2  --   --  8.0  NEUTROABS 5.9  --   --   --   HGB 11.6* 11.6* 12.2* 11.7*  HCT 34.8* 34.0* 36.0* 34.0*  MCV 97.2  --   --  95.0  PLT 247  --   --  628    Basic Metabolic Panel: Recent Labs  Lab 04/24/21 0555 04/24/21 0747 04/24/21 0748 04/24/21 2341 04/25/21 0327 04/25/21 0339  NA 123* 127* 126*  126* 126*  --   K 4.5 4.5 4.5 4.0 3.8  --   CL 92*  --  94* 94* 94*  --   CO2 20*  --   --  22 20*  --   GLUCOSE 178*  --  140* 181* 145*  --   BUN 21  --  22 23 21   --   CREATININE 1.33*  --  1.20 1.22 1.20  --   CALCIUM 9.1  --   --  9.2 9.0  --   MG  --   --   --   --   --  1.8   GFR: Estimated Creatinine Clearance: 39.6 mL/min (by C-G formula based on SCr of 1.2 mg/dL). Recent Labs  Lab 04/24/21 0555 04/24/21 0556 04/24/21 0849 04/25/21 0310 04/25/21 0327  WBC 8.2  --   --   --   8.0  LATICACIDVEN  --  2.6* 1.7 1.3  --     Liver Function Tests: Recent Labs  Lab 04/24/21 0555  AST 114*  ALT 110*  ALKPHOS 116  BILITOT 0.7  PROT 6.5  ALBUMIN 3.5   No results for input(s): LIPASE, AMYLASE in the last 168 hours. No results for input(s): AMMONIA in the last 168 hours.  ABG    Component Value Date/Time   PHART 7.412 10/07/2019 1115   PCO2ART 38.3 10/07/2019 1115   PO2ART 116 (H) 10/07/2019 1115   HCO3 23.3 04/24/2021 0747   TCO2 22 04/24/2021 0748   ACIDBASEDEF 1.0 04/24/2021 0747   O2SAT 36.0 04/24/2021 0747     Coagulation Profile: No results for input(s): INR, PROTIME in the last 168 hours.  Cardiac Enzymes: No results for input(s): CKTOTAL, CKMB, CKMBINDEX, TROPONINI in the last 168 hours.  HbA1C: Hgb A1c MFr Bld  Date/Time Value Ref Range Status  10/07/2019 10:32 AM 6.5 (H) 4.8 - 5.6 % Final    Comment:    (NOTE) Pre diabetes:          5.7%-6.4% Diabetes:              >6.4% Glycemic control for   <7.0% adults with diabetes     CBG: Recent Labs  Lab 04/24/21 0630  GLUCAP 154*    Review of Systems:   Review of Systems  Constitutional: Negative.   Respiratory:  Positive for shortness of breath. Negative for cough and hemoptysis.   Cardiovascular:  Negative for chest pain and palpitations.  Gastrointestinal: Negative.   Musculoskeletal:  Positive for back pain.  Neurological:  Positive for dizziness and weakness. Negative for focal weakness.    Past Medical History:  He,  has a past medical history of Bilateral carotid artery disease ([place]), Diabetes mellitus without complication ([place]), ED (erectile dysfunction), Hyperlipidemia, Hypertension, Lumbar disc disease, Mitral valve prolapse, Moderate mitral regurgitation (03/16/2014), and S/P mitral valve clip implantation (10/09/2019).   Surgical History:   Past Surgical History:  Procedure Laterality Date   CARDIAC CATHETERIZATION     MITRAL VALVE REPAIR  10/09/2019   MITRAL  VALVE REPAIR (N/A )   MITRAL VALVE REPAIR N/A 10/09/2019   Procedure: MITRAL VALVE REPAIR;  Surgeon: Sherren Mocha, MD;  Location: [facility];  Service: Cardiovascular;  Laterality: N/A;   RIGHT/LEFT HEART CATH AND CORONARY ANGIOGRAPHY N/A 09/16/2019   Procedure: RIGHT/LEFT HEART CATH AND CORONARY ANGIOGRAPHY;  Surgeon: Belva Crome, MD;  Location: [facility];  Service: Cardiovascular;  Laterality: N/A;   TEE WITHOUT CARDIOVERSION N/A 09/16/2019  Procedure: TRANSESOPHAGEAL ECHOCARDIOGRAM (TEE);  Surgeon: Lelon Perla, MD;  Location: [facility] ENDOSCOPY;  Service: Cardiovascular;  Laterality: N/A;     Social History:   reports that he has never smoked. He has never used smokeless tobacco. He reports that he does not drink alcohol and does not use drugs.   Family History:  His family history includes Cancer in his mother; Dementia in his sister; Diabetes in his father and sister; Heart disease in his father and sister; Obesity in his sister; Other in his father and sister; Stroke in his father.   Allergies Allergies  Allergen Reactions   Duloxetine Hcl Other (See Comments)    withdrawal     Home Medications  Prior to Admission medications   Medication Sig Start Date End Date Taking? Authorizing Provider  acetaminophen (TYLENOL) 500 MG tablet Take 1,000 mg by mouth every 6 (six) hours as needed (for pain.).   Yes [provider]  amoxicillin (AMOXIL) 500 MG tablet Take 4 capsules (2,000 mg) one hour prior to all dental visits. 10/15/19  Yes Eileen Stanford, PA-C  aspirin 81 MG EC tablet Take 81 mg by mouth daily.   Yes [provider]  Cholecalciferol (VITAMIN D) 2000 UNITS tablet Take 2,000 Units by mouth daily at 12 noon.    Yes [provider]  Cyanocobalamin (VITAMIN B-12) 2000 MCG TBCR 1 tablet   Yes [provider]  finasteride (PROSCAR) 5 MG tablet Take 5 mg by mouth daily at 12 noon.    Yes [provider]   HYDROcodone-acetaminophen (NORCO) 7.5-325 MG tablet Take 1 tablet by mouth daily as needed for moderate pain. 04/21/21  Yes [provider]  hydrocortisone cream 1 % Apply 1 application topically as needed for itching.   Yes [provider]  levothyroxine (SYNTHROID, LEVOTHROID) 75 MCG tablet Take 75 mcg by mouth daily before breakfast.   Yes [provider]  metFORMIN (GLUCOPHAGE) 500 MG tablet Take 500 mg by mouth every evening.  04/23/16  Yes [provider]  pravastatin (PRAVACHOL) 20 MG tablet Take 20 mg by mouth at bedtime.    Yes [provider]  psyllium (METAMUCIL) 58.6 % packet Take 1 packet by mouth daily.   Yes [provider]  ramipril (ALTACE) 5 MG capsule Take 5 mg by mouth daily.    Yes [provider]  sodium chloride (OCEAN) 0.65 % SOLN nasal spray Place 1 spray into both nostrils daily as needed for congestion.   Yes [provider]  tamsulosin (FLOMAX) 0.4 MG CAPS capsule Take 0.4 mg by mouth daily at 12 noon.  04/13/18  Yes [provider]  Thiamine HCl (VITAMIN B1 PO) Take 125 mcg by mouth daily.   Yes [provider]  traMADol (ULTRAM) 50 MG tablet Take 50 mg by mouth 2 (two) times daily as needed. 04/18/21  Yes [provider]  chlordiazePOXIDE (LIBRIUM) 10 MG capsule Take 1 tablet by mouth daily. Patient not taking: Reported on 04/24/2021 07/15/09   [provider]     Critical care time: n/a     Otilio Carpen Sherece Gambrill, PA-C [facility] See Amion for pager If no response to pager , please call [phone] until 7pm After 7:00 pm call Elink  [phone]

## 2021-04-25 NOTE — TOC Progression Note (Signed)
Transition of Care [facility]) - Progression Note    Patient Details  Name: Adrian Olson MRN: [id number] Date of Birth: 19-Mar-1931  [facility] CM/SW Contact  Zenon Mayo, RN Phone Number: 04/25/2021, 8:06 PM  Clinical Narrative:    from home with wife, s/p fall at home, with  rib fx with ptx, s/p throacentesis, cvts recs placement of  a chest tube.         Expected Discharge Plan and Services                                                 Social Determinants of Health (SDOH) Interventions    Readmission Risk Interventions No flowsheet data found.

## 2021-04-25 NOTE — Progress Notes (Signed)
Physical Therapy Treatment Patient Details Name: Adrian Olson MRN: [id number] DOB: [birth year] Today's Date: 04/25/2021   History of Present Illness 87 year old male who presented to the [facility] emergency department 04/24/21 via EMS after having an episode of unresponsiveness at home this morning.  CT angiogram of chest abdomen pelvis with large right pleural effusion collection and small moderate left fluid collection with associated bibasilar atelectasis and multiple mildly displaced and slightly comminuted rib fractures at the posterior lateral right ribs 6 through 11. 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 He required supervision for bed mobility, min guard assist sit to stand, and min/HHA amb 15'. Pt on 2L at rest with SpO2 99%. Mobilized on RA with desat to 92%. 2L replaced at end of session. Pt more fatigued today as compared to previous session, with poor activity tolerance. Pt in SVT overnight but back in NSR this AM at time of session. Pt returned to stretcher at end of session. Pt educated on use of inspirometer.     Recommendations for follow up therapy are one component of a multi-disciplinary discharge planning process, led by the attending physician.  Recommendations may be updated based on patient status, additional functional criteria and insurance authorization.  Follow Up Recommendations  Home health PT     Assistance Recommended at Discharge Frequent or constant Supervision/Assistance  Equipment Recommendations  None recommended by PT    Recommendations for Other Services       Precautions / Restrictions Precautions Precautions: Fall;Other (comment) Precaution Comments: R rib 6-11 fx, watch vitals     Mobility  Bed Mobility Overal bed  mobility: Needs Assistance Bed Mobility: Supine to Sit;Sit to Supine     Supine to sit: Supervision;HOB elevated Sit to supine: Supervision   General bed mobility comments: supervision for safety with navigation of ED stretcher    Transfers Overall transfer level: Needs assistance Equipment used: 1 person hand held assist Transfers: Sit to/from Stand Sit to Stand: Min guard           General transfer comment: min guard for safety    Ambulation/Gait Ambulation/Gait assistance: Min assist Gait Distance (Feet): 15 Feet Assistive device: 1 person hand held assist Gait Pattern/deviations: Step-through pattern;Decreased stride length Gait velocity: decreased Gait velocity interpretation: <1.31 ft/sec, indicative of household ambulator   General Gait Details: fatigued quickly, stating "I just feel really weak today." Amb on RA with SpO2 92%. HR in 70/80s.   Stairs             Wheelchair Mobility    Modified Rankin (Stroke Patients Only)       Balance Overall balance assessment: Mild deficits observed, not formally tested                                          Cognition Arousal/Alertness: Awake/alert Behavior During Therapy: WFL for tasks assessed/performed Overall Cognitive Status: Within Functional Limits for tasks assessed                                          Exercises  General Comments General comments (skin integrity, edema, etc.): SpO2 99% at rest on 2L. Mobilized on RA with desat to 92%. 2L replaced at end of session.      Pertinent Vitals/Pain Pain Assessment: Faces Faces Pain Scale: Hurts little more Pain Location: R flank Pain Descriptors / Indicators: Guarding;Discomfort Pain Intervention(s): Limited activity within patient's tolerance;Monitored during session;Repositioned    Home Living                          Prior Function            PT Goals (current goals can now be found  in the care plan section) Acute Rehab PT Goals Patient Stated Goal: home Progress towards PT goals: Progressing toward goals    Frequency    Min 3X/week      PT Plan Current plan remains appropriate    Co-evaluation              AM-PAC PT "6 Clicks" Mobility   Outcome Measure  Help needed turning from your back to your side while in a flat bed without using bedrails?: None Help needed moving from lying on your back to sitting on the side of a flat bed without using bedrails?: A Little Help needed moving to and from a bed to a chair (including a wheelchair)?: A Little Help needed standing up from a chair using your arms (e.g., wheelchair or bedside chair)?: A Little Help needed to walk in hospital room?: A Little Help needed climbing 3-5 steps with a railing? : A Lot 6 Click Score: 18    End of Session Equipment Utilized During Treatment: Gait belt;Oxygen Activity Tolerance: Patient limited by fatigue Patient left: in bed;with call bell/phone within reach;with family/visitor present Nurse Communication: Mobility status PT Visit Diagnosis: Unsteadiness on feet (R26.81);Other abnormalities of gait and mobility (R26.89);Repeated falls (R29.6);Muscle weakness (generalized) (M62.81);History of falling (Z91.81);Pain Pain - Right/Left: Right     Time: 5993-5701 PT Time Calculation (min) (ACUTE ONLY): 25 min  Charges:  $Gait Training: 23-37 mins                     Lorrin Goodell, [place]  Office # [phone] Pager [phone]    Lorriane Shire 04/25/2021, 10:57 AM

## 2021-04-25 NOTE — Progress Notes (Signed)
OT Cancellation Note  Patient Details Name: Adrian Olson MRN: [id number] DOB: 18-Mar-1931   Cancelled Treatment:    Reason Eval/Treat Not Completed: Patient at procedure or test/ unavailable. Nursing in room with pt and requesting OT return at a later time. Will follow as able.  Helane Gunther, OT/L  Acute Rehab [place] 04/25/2021, 12:03 PM

## 2021-04-25 NOTE — Evaluation (Signed)
Occupational Therapy Evaluation Patient Details Name: Adrian Olson MRN: [id number] DOB: 1931-01-03 Today's Date: 04/25/2021   History of Present Illness 87 year old male who presented to the [facility] emergency department 04/24/21 via EMS after having an episode of unresponsiveness at home this morning.  CT angiogram of chest abdomen pelvis with large right pleural effusion collection and small moderate left fluid collection with associated bibasilar atelectasis and multiple mildly displaced and slightly comminuted rib fractures at the posterior lateral right ribs 6 through 11. 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 Currently requiring supervision - Min guard for ADLs and functional transfers/mobility. Pt independent and highly active at baseline and lives with wife who will be available to provide assistance as needed at home. Pt would likely benefit from [facility] to ensure safe transition home. Will follow acutely to maximize safety/independence with ADLs prior to return home.      Recommendations for follow up therapy are one component of a multi-disciplinary discharge planning process, led by the attending physician.  Recommendations may be updated based on patient status, additional functional criteria and insurance authorization.   Follow Up Recommendations  Home health OT    Assistance Recommended at Discharge Intermittent Supervision/Assistance  Functional Status Assessment  Patient has had a recent decline in their functional status and demonstrates the ability to make significant improvements in function in a reasonable and predictable amount of time.  Equipment Recommendations  None recommended by OT    Recommendations for Other Services       Precautions /  Restrictions Precautions Precautions: Fall;Other (comment) Precaution Comments: R rib 6-11 fx, watch vitals Restrictions Weight Bearing Restrictions: No      Mobility Bed Mobility Overal bed mobility: Needs Assistance Bed Mobility: Supine to Sit;Sit to Supine     Supine to sit: Supervision;HOB elevated Sit to supine: Supervision        Transfers Overall transfer level: Needs assistance Equipment used: 1 person hand held assist Transfers: Sit to/from Stand Sit to Stand: Min guard                  Balance Overall balance assessment: Mild deficits observed, not formally tested                                         ADL either performed or assessed with clinical judgement   ADL Overall ADL's : Needs assistance/impaired Eating/Feeding: Independent   Grooming: Min guard;Standing   Upper Body Bathing: Supervision/ safety;Sitting   Lower Body Bathing: Min guard;Sit to/from stand   Upper Body Dressing : Set up;Sitting   Lower Body Dressing: Min guard;Sit to/from stand   Toilet Transfer: Min guard;Ambulation;Regular Museum/gallery exhibitions officer and Hygiene: Supervision/safety       Functional mobility during ADLs: Min guard       Vision Patient Visual Report: No change from baseline       Perception     Praxis      Pertinent Vitals/Pain Pain Assessment: No/denies pain     Hand Dominance Right   Extremity/Trunk Assessment Upper Extremity Assessment Upper Extremity Assessment: Overall WFL for tasks assessed   Lower Extremity Assessment Lower Extremity Assessment: Defer to PT evaluation  Communication Communication Communication: HOH   Cognition Arousal/Alertness: Awake/alert Behavior During Therapy: WFL for tasks assessed/performed Overall Cognitive Status: Within Functional Limits for tasks assessed                                       General Comments       Exercises      Shoulder Instructions      Home Living Family/patient expects to be discharged to:: Private residence Living Arrangements: Spouse/significant other Available Help at Discharge: Family;Available 24 hours/day Type of Home: House Home Access: Stairs to enter [employer] of Steps: 1   Home Layout: One level     Bathroom Shower/Tub: Occupational psychologist: Standard Bathroom Accessibility: Yes How Accessible: Accessible via walker Home Equipment: [place] - single point;BSC/3in1;Rollator (4 wheels)          Prior Functioning/Environment Prior Level of Function : Independent/Modified Independent             Mobility Comments: reports walking 3 miles a day ADLs Comments: independent with ADLs and drives        OT Problem List: Impaired balance (sitting and/or standing);Decreased activity tolerance;Decreased knowledge of use of DME or AE      OT Treatment/Interventions: Self-care/ADL training;Therapeutic exercise;Energy conservation;DME and/or AE instruction;Therapeutic activities;Patient/family education;Balance training    OT Goals(Current goals can be found in the care plan section) Acute Rehab OT Goals Patient Stated Goal: return home OT Goal Formulation: With patient/family Time For Goal Achievement: 05/09/21 Potential to Achieve Goals: Good  OT Frequency: Min 2X/week   Barriers to D/C:            Co-evaluation              AM-PAC OT "6 Clicks" Daily Activity     Outcome Measure Help from another person eating meals?: None Help from another person taking care of personal grooming?: A Little Help from another person toileting, which includes using toliet, bedpan, or urinal?: A Little Help from another person bathing (including washing, rinsing, drying)?: A Little Help from another person to put on and taking off regular upper body clothing?: A Little Help from another person to put on and taking off regular lower body clothing?: A  Little 6 Click Score: 19   End of Session Equipment Utilized During Treatment: Rolling walker (2 wheels) Nurse Communication: Mobility status  Activity Tolerance: Patient tolerated treatment well Patient left: in bed;with call bell/phone within reach;with family/visitor present  OT Visit Diagnosis: Unsteadiness on feet (R26.81)                Time: 2426-8341 OT Time Calculation (min): 16 min Charges:  OT General Charges $OT Visit: 1 Visit OT Evaluation $OT Eval Low Complexity: 1 Low  Theodoro Koval C, OT/L  Acute Rehab [place] 04/25/2021, 3:44 PM

## 2021-04-25 NOTE — ED Notes (Signed)
RN rounding on patient. RN noted patient HR in the 130s, sustaining between 134-138 bpm. Pt is asymptomatic. Pt does state to be restless, but has no c/o chest pain, SOB or chest pressure/soreness at this time. Patient on room air and has oxygen saturation between 94-96%. Patient BP slightly elevated from WDL parameters. RN to continue to monitor, EKG captured and page put out to hospitalist team concerning patient HR.

## 2021-04-25 NOTE — Progress Notes (Signed)
Patient ID: Adrian Olson, male   DOB: May 08, 1931, 87 y.o.   MRN: [id number] [facility] Surgery Progress Note     Subjective: CC-  Sitting up in bed eating breakfast. No complaints. Not taking any pain medication. SVT over night, received iv adenosine x2. Back in NSR, HR 70s. Denies CP or SOB. O2 sats upper 90s on 2L [place].  Objective: Vital signs in last 24 hours: Pulse Rate:  [34-136] 64 (12/12 0700) Resp:  [14-32] 21 (12/12 0700) BP: (93-160)/(58-119) 141/87 (12/12 0700) SpO2:  [90 %-100 %] 98 % (12/12 0700) Weight:  [65.8 kg] 65.8 kg (12/11 1246)    Intake/Output from previous day: 12/11 0701 - 12/12 0700 In: -  Out: 2800 [Urine:2800] Intake/Output this shift: No intake/output data recorded.  PE: Gen:  Alert, NAD, pleasant HEENT: EOM's intact, pupils equal and round Card:  RRR Pulm:  CTAB, diminished breath sounds bilateral bases, no W/R/R, rate and effort normal on 2L [place] Abd: Soft, NT/ND Psych: A&Ox3 Skin: no rashes noted, warm and dry  Lab Results:  Recent Labs    04/24/21 0555 04/24/21 0747 04/24/21 0748 04/25/21 0327  WBC 8.2  --   --  8.0  HGB 11.6*   < > 12.2* 11.7*  HCT 34.8*   < > 36.0* 34.0*  PLT 247  --   --  251   < > = values in this interval not displayed.   BMET Recent Labs    04/24/21 2341 04/25/21 0327  NA 126* 126*  K 4.0 3.8  CL 94* 94*  CO2 22 20*  GLUCOSE 181* 145*  BUN 23 21  CREATININE 1.22 1.20  CALCIUM 9.2 9.0   PT/INR No results for input(s): LABPROT, INR in the last 72 hours. CMP     Component Value Date/Time   NA 126 (L) 04/25/2021 0327   NA 126 (L) 04/11/2021 1421   K 3.8 04/25/2021 0327   CL 94 (L) 04/25/2021 0327   CO2 20 (L) 04/25/2021 0327   GLUCOSE 145 (H) 04/25/2021 0327   BUN 21 04/25/2021 0327   BUN 21 04/11/2021 1421   CREATININE 1.20 04/25/2021 0327   CALCIUM 9.0 04/25/2021 0327   PROT 6.5 04/24/2021 0555   ALBUMIN 3.5 04/24/2021 0555   AST 114 (H) 04/24/2021 0555   ALT 110 (H) 04/24/2021  0555   ALKPHOS 116 04/24/2021 0555   BILITOT 0.7 04/24/2021 0555   GFRNONAA 58 (L) 04/25/2021 0327   GFRAA >60 10/10/2019 0203   Lipase  No results found for: LIPASE     Studies/Results: CT HEAD WO CONTRAST  Result Date: 04/24/2021 CLINICAL DATA:  87 year old male new onset seizure. Became unresponsive. EXAM: CT HEAD WITHOUT CONTRAST TECHNIQUE: Contiguous axial images were obtained from the base of the skull through the vertex without intravenous contrast. COMPARISON:  None. FINDINGS: Brain: Cerebral volume is within normal limits for age. No midline shift, ventriculomegaly, mass effect, evidence of mass lesion, intracranial hemorrhage or evidence of cortically based acute infarction. Mild dystrophic calcifications bilateral basal ganglia. Gray-white matter differentiation within normal limits for age. No cortical encephalomalacia identified. Vascular: Calcified atherosclerosis at the skull base. No suspicious intracranial vascular hyperdensity. Skull: No fracture identified. Sinuses/Orbits: Visualized paranasal sinuses and mastoids are well aerated. Other: No discrete orbit or scalp soft tissue injury identified. IMPRESSION: Normal for age non contrast CT appearance of the brain. No acute traumatic injury identified. Electronically Signed   By: Genevie Ann M.D.   On: 04/24/2021 07:39   CT  Angio Chest PE W and/or Wo Contrast  Result Date: 04/24/2021 CLINICAL DATA:  Possible pulmonary embolism. Syncope at home when getting up to go to the bathroom. EXAM: CT ANGIOGRAPHY CHEST CT ABDOMEN AND PELVIS WITH CONTRAST TECHNIQUE: Multidetector CT imaging of the chest was performed using the standard protocol during bolus administration of intravenous contrast. Multiplanar CT image reconstructions and MIPs were obtained to evaluate the vascular anatomy. Multidetector CT imaging of the abdomen and pelvis was performed using the standard protocol during bolus administration of intravenous contrast. CONTRAST:   160mL OMNIPAQUE IOHEXOL 350 MG/ML SOLN COMPARISON:  CT abdomen/pelvis 10/08/2008 FINDINGS: CTA CHEST FINDINGS Cardiovascular: Mild-to-moderate cardiomegaly. Prosthetic mitral valve noted. Mild calcified plaque over the left main and right coronary arteries. Ascending thoracic aorta measures approximately 3.6 cm in AP diameter. Calcified plaque over the descending thoracic aorta. Pulmonary arterial system is well opacified and demonstrates no evidence of emboli. Mediastinum/Nodes: No definite mediastinal or hilar adenopathy. Remaining mediastinal structures are unremarkable. Lungs/Pleura: Moderate moderate to large right pleural fluid collection and small to moderate left pleural fluid collection with associated atelectatic change within the lung bases. No pneumothorax. Airways are unremarkable. Musculoskeletal: Numerous right posterior and lateral rib fractures involving ribs 6 through 11. Review of the MIP images confirms the above findings. CT ABDOMEN and PELVIS FINDINGS Hepatobiliary: Liver and biliary tree are normal. Tiny amount of adjacent pericholecystic fluid as the gallbladder is unremarkable. Pancreas: Normal. Spleen: Normal. Adrenals/Urinary Tract: Adrenal glands are normal. Kidneys are normal in size without focal mass or hydronephrosis. Ureters and bladder are normal. Stomach/Bowel: Stomach and small bowel are normal. Diverticulosis of the colon with mild fecal retention throughout the colon. Appendix not visualized. Vascular/Lymphatic: Mild-to-moderate calcified plaque over the abdominal aorta which is normal in caliber. No adenopathy. Reproductive: Mild prostatic enlargement with moderate pressure upon the bladder base. Other: No significant free peritoneal fluid and no free peritoneal air. Small right inguinal hernia containing peritoneal fat and tiny amount of fluid. Musculoskeletal: Degenerative change of the spine. No acute fracture. Review of the MIP images confirms the above findings.  IMPRESSION: 1. No evidence of pulmonary embolism. 2. Moderate to large right pleural fluid collection and small to moderate left pleural fluid collection with associated bibasilar atelectasis. No pneumothorax. Multiple associated mildly displaced and slightly comminuted fractures of posterolateral right ribs 6 through 11. 3. No acute findings in the abdomen/pelvis. 4. Colonic diverticulosis without active inflammation. 5. Mild prostatic enlargement with moderate impression upon the bladder base. 6. Small right inguinal hernia containing peritoneal fat and tiny amount of fluid. 7. Aortic atherosclerosis. Atherosclerotic coronary artery disease. Aortic Atherosclerosis (ICD10-I70.0). Electronically Signed   By: Marin Olp M.D.   On: 04/24/2021 09:09   MR BRAIN WO CONTRAST  Result Date: 04/24/2021 CLINICAL DATA:  Stroke follow-up EXAM: MRI HEAD WITHOUT CONTRAST TECHNIQUE: Multiplanar, multiecho pulse sequences of the brain and surrounding structures were obtained without intravenous contrast. COMPARISON:  CT head 04/24/2021 FINDINGS: Brain: No acute infarction, hemorrhage, hydrocephalus, extra-axial collection or mass lesion. Minimal white matter changes. Mild cerebral atrophy. Vascular: Normal arterial flow voids at the skull base. Skull and upper cervical spine: No focal skeletal lesion. Sinuses/Orbits: Paranasal sinuses clear. Bilateral cataract extraction Other: None IMPRESSION: No acute abnormality. Electronically Signed   By: Franchot Gallo M.D.   On: 04/24/2021 17:47   CT ABDOMEN PELVIS W CONTRAST  Result Date: 04/24/2021 CLINICAL DATA:  Possible pulmonary embolism. Syncope at home when getting up to go to the bathroom. EXAM: CT ANGIOGRAPHY CHEST CT ABDOMEN  AND PELVIS WITH CONTRAST TECHNIQUE: Multidetector CT imaging of the chest was performed using the standard protocol during bolus administration of intravenous contrast. Multiplanar CT image reconstructions and MIPs were obtained to evaluate the  vascular anatomy. Multidetector CT imaging of the abdomen and pelvis was performed using the standard protocol during bolus administration of intravenous contrast. CONTRAST:  123mL OMNIPAQUE IOHEXOL 350 MG/ML SOLN COMPARISON:  CT abdomen/pelvis 10/08/2008 FINDINGS: CTA CHEST FINDINGS Cardiovascular: Mild-to-moderate cardiomegaly. Prosthetic mitral valve noted. Mild calcified plaque over the left main and right coronary arteries. Ascending thoracic aorta measures approximately 3.6 cm in AP diameter. Calcified plaque over the descending thoracic aorta. Pulmonary arterial system is well opacified and demonstrates no evidence of emboli. Mediastinum/Nodes: No definite mediastinal or hilar adenopathy. Remaining mediastinal structures are unremarkable. Lungs/Pleura: Moderate moderate to large right pleural fluid collection and small to moderate left pleural fluid collection with associated atelectatic change within the lung bases. No pneumothorax. Airways are unremarkable. Musculoskeletal: Numerous right posterior and lateral rib fractures involving ribs 6 through 11. Review of the MIP images confirms the above findings. CT ABDOMEN and PELVIS FINDINGS Hepatobiliary: Liver and biliary tree are normal. Tiny amount of adjacent pericholecystic fluid as the gallbladder is unremarkable. Pancreas: Normal. Spleen: Normal. Adrenals/Urinary Tract: Adrenal glands are normal. Kidneys are normal in size without focal mass or hydronephrosis. Ureters and bladder are normal. Stomach/Bowel: Stomach and small bowel are normal. Diverticulosis of the colon with mild fecal retention throughout the colon. Appendix not visualized. Vascular/Lymphatic: Mild-to-moderate calcified plaque over the abdominal aorta which is normal in caliber. No adenopathy. Reproductive: Mild prostatic enlargement with moderate pressure upon the bladder base. Other: No significant free peritoneal fluid and no free peritoneal air. Small right inguinal hernia containing  peritoneal fat and tiny amount of fluid. Musculoskeletal: Degenerative change of the spine. No acute fracture. Review of the MIP images confirms the above findings. IMPRESSION: 1. No evidence of pulmonary embolism. 2. Moderate to large right pleural fluid collection and small to moderate left pleural fluid collection with associated bibasilar atelectasis. No pneumothorax. Multiple associated mildly displaced and slightly comminuted fractures of posterolateral right ribs 6 through 11. 3. No acute findings in the abdomen/pelvis. 4. Colonic diverticulosis without active inflammation. 5. Mild prostatic enlargement with moderate impression upon the bladder base. 6. Small right inguinal hernia containing peritoneal fat and tiny amount of fluid. 7. Aortic atherosclerosis. Atherosclerotic coronary artery disease. Aortic Atherosclerosis (ICD10-I70.0). Electronically Signed   By: Marin Olp M.D.   On: 04/24/2021 09:09    Anti-infectives: Anti-infectives (From admission, onward)    None        Assessment/Plan GLF several days PTA   R 6-11 rib fx - no PTX on CT. pulmonary toilet, incentive spirometry, wean O2 as able, pain control (not taking any pain medication, has tylenol ordered PRN) Moderate to large R pleural effusion - IR consult for image guided thoracentesis pending Small to moderate left pleural effusion - monitor   FEN: Heart healthy ID: None currently VTE: Lovenox   Dispo: IR for thoracentesis, PT/OT - recommending HH PT   Per primary: Hyponatremia Renal insufficiency Hypertension Orthostatic hypotension CHF  Transaminitis Hypothyroidism Right inguinal hernia Hyperlipidemia   LOS: 1 day    [place]ellington [place], [facility] Surgery 04/25/2021, 8:45 AM Please see Amion for pager number during day hours 7:00am-4:30pm

## 2021-04-25 NOTE — ED Notes (Signed)
Tele-transport entered

## 2021-04-25 NOTE — Progress Notes (Signed)
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  Apparently, patient had a fall during Thanksgiving and hit his rib cage on the right onto a dresser.  Ever since, he has been weaker, off balance and having more difficulties moving around.  He was evaluated by PCP as well as cardiology, he was noted to be orthostatic and his HCTZ was discontinued.  He has not had any more falls but does need more more assistance getting around.  The morning of admission he was so weak he could not get up and when family tried to help his legs were stiff, breathing became labored was out of it.  EMS was called, he was noted to be hypoxic and was brought to the hospital.  Work-up in the ED showed large right-sided pleural effusion and small left one.  It also showed multiple rib fractures on the posterior lateral right ribs 6 through 11.  Overnight after admission developed SVT refractory to adenosine, eventually improved with metoprolol  Subjective / 24h Interval events: Doing well this morning, denies any chest pain or palpitations.  He had no chest pains overnight when he was in SVT  Assessment & Plan: Principal Problem Acute hypoxic respiratory failure due to large right-sided pleural effusion-provide oxygen to maintain sats above 90%.  Right-sided pleural effusion likely related to his fall, trauma surgery consulted.  Pulmonary consulted as well and they will do a thoracentesis today, potentially chest tube based on findings.  Continue to monitor  Active Problems Episode of unresponsiveness/syncope- ?  Arrhythmia.  She did have PVCs in the past and wore a Holter monitor for 3 days just a few weeks ago.  He is not sure of the  results.  SVT-developed overnight, received adenosine 6 mg as well as 12 mg without any improvement.  Received metoprolol which apparently slowed down and currently he is in sinus.  Cardiology consulted.  2D echo pending.  On metoprolol 12.5 BID  Chronic diastolic CHF, history of MVP-status post MitraClip.  2D echo pending.  Not sure whether he is right-sided pleural effusion is due to acute on chronic diastolic CHF versus posttrauma  Elevated troponin-likely demand, no chest pain  Dizziness, orthostatic hypotension-an MRI of the brain obtained on admission was negative.  Cardiology to see.  PT to evaluate  Hyponatremia-acute on chronic, appears hypervolemic  CKD 3A-Baseline creatinine 1-1.2, currently close to baseline  Essential hypertension -hold home medication for now.  Has been placed on metoprolol 12.5  Transaminitis-Question if secondary to passive congestion   Hypothyroidism -TSH on the high side.  Will discuss with patient whether he takes it correctly   Right inguinal hernia- Incidental, outpatient follow-up with general surgery   Hyperlipidemia-on statin  Scheduled Meds:  adenosine       enoxaparin (LOVENOX) injection  40 mg Subcutaneous Q24H   metoprolol tartrate  12.5 mg Oral BID   senna-docusate  1 tablet Oral QHS   sodium chloride flush  3 mL Intravenous Q12H   Continuous Infusions: PRN Meds:.acetaminophen **OR** acetaminophen, albuterol, ondansetron **OR** ondansetron (ZOFRAN) IV, sorbitol, milk of mag, mineral oil, glycerin (SMOG) enema  Diet Orders (From admission, onward)     Start     Ordered   04/24/21 1214  Diet Heart Room service appropriate? Yes; Fluid consistency: Thin  Diet effective now       Question Answer Comment  Room service appropriate? Yes   Fluid consistency: Thin      04/24/21 1214            DVT prophylaxis: enoxaparin (LOVENOX) injection 40 mg Start: 04/24/21 1300     Code Status: Full Code  Family Communication: No family  at bedside  Status is: Inpatient  Remains inpatient appropriate because: Further work-up by PCCM, cardiology, still hyponatremic, hypoxic   Level of care: Telemetry Medical  Consultants:  Cardiology PCCM  Procedures:  2D echo: pending  Microbiology  none  Antimicrobials: none    Objective: Vitals:   04/25/21 0900 04/25/21 0915 04/25/21 0930 04/25/21 0945  BP: (!) 156/80 (!) 141/80 136/86 (!) 158/84  Pulse: 63 62 60 61  Resp: 18 15 (!) 21 (!) 22  Temp:      TempSrc:      SpO2: 99% 98% 94% 98%  Weight:      Height:        Intake/Output Summary (Last 24 hours) at 04/25/2021 1009 Last data filed at 04/25/2021 0224 Gross per 24 hour  Intake --  Output 2800 ml  Net -2800 ml   Filed Weights   04/24/21 1246  Weight: 65.8 kg    Examination:  Constitutional: NAD Eyes: no scleral icterus ENMT: Mucous membranes are moist.  Neck: normal, supple Respiratory: No breath sounds on the right lung base, no wheezing, minimal faint crackles on the left, moves air well Cardiovascular: Regular rate and rhythm, 3/6 SEM.  Trace edema Abdomen: non distended, no tenderness. Bowel sounds positive.  Musculoskeletal: no clubbing / cyanosis.  Skin: no rashes Neurologic: CN 2-12 grossly intact. Strength 5/5 in all 4.  Psychiatric: Normal judgment and insight. Alert and oriented x 3. Normal mood.    Data Reviewed: I have independently reviewed following labs and imaging studies  CBC: Recent Labs  Lab 04/24/21 0555 04/24/21 0747 04/24/21 0748 04/25/21 0327  WBC 8.2  --   --  8.0  NEUTROABS 5.9  --   --   --   HGB 11.6* 11.6* 12.2* 11.7*  HCT 34.8* 34.0* 36.0* 34.0*  MCV 97.2  --   --  95.0  PLT 247  --   --  811   Basic Metabolic Panel: Recent Labs  Lab 04/24/21 0555 04/24/21 0747 04/24/21 0748 04/24/21 2341 04/25/21 0327 04/25/21 0339  NA 123* 127* 126* 126* 126*  --   K 4.5 4.5 4.5 4.0 3.8  --   CL 92*  --  94* 94* 94*  --   CO2 20*  --   --  22 20*  --    GLUCOSE 178*  --  140* 181* 145*  --   BUN 21  --  22 23 21   --   CREATININE 1.33*  --  1.20 1.22 1.20  --   CALCIUM 9.1  --   --  9.2 9.0  --   MG  --   --   --   --   --  1.8   Liver Function Tests: Recent Labs  Lab 04/24/21 0555  AST 114*  ALT 110*  ALKPHOS 116  BILITOT 0.7  PROT 6.5  ALBUMIN 3.5   Coagulation Profile: No results for input(s): INR, PROTIME in the last 168 hours. HbA1C: No results for input(s): HGBA1C in the last 72 hours. CBG: Recent Labs  Lab 04/24/21  0630  GLUCAP 154*    Recent Results (from the past 240 hour(s))  Resp Panel by RT-PCR (Flu A&B, Covid) Nasopharyngeal Swab     Status: None   Collection Time: 04/24/21  7:28 AM   Specimen: Nasopharyngeal Swab; Nasopharyngeal(NP) swabs in vial transport medium  Result Value Ref Range Status   SARS Coronavirus 2 by RT PCR NEGATIVE NEGATIVE Final    Comment: (NOTE) SARS-CoV-2 target nucleic acids are NOT DETECTED.  The SARS-CoV-2 RNA is generally detectable in upper respiratory specimens during the acute phase of infection. The lowest concentration of SARS-CoV-2 viral copies this assay can detect is 138 copies/mL. A negative result does not preclude SARS-Cov-2 infection and should not be used as the sole basis for treatment or other patient management decisions. A negative result may occur with  improper specimen collection/handling, submission of specimen other than nasopharyngeal swab, presence of viral mutation(s) within the areas targeted by this assay, and inadequate number of viral copies(<138 copies/mL). A negative result must be combined with clinical observations, patient history, and epidemiological information. The expected result is Negative.  Fact Sheet for Patients:  EntrepreneurPulse.com.au  Fact Sheet for Healthcare Providers:  IncredibleEmployment.be  This test is no t yet approved or cleared by the [place] FDA and  has been authorized  for detection and/or diagnosis of SARS-CoV-2 by FDA under an Emergency Use Authorization (EUA). This EUA will remain  in effect (meaning this test can be used) for the duration of the COVID-19 declaration under Section 564(b)(1) of the Act, 21 U.S.C.section 360bbb-3(b)(1), unless the authorization is terminated  or revoked sooner.       Influenza A by PCR NEGATIVE NEGATIVE Final   Influenza B by PCR NEGATIVE NEGATIVE Final    Comment: (NOTE) The Xpert Xpress SARS-CoV-2/FLU/RSV plus assay is intended as an aid in the diagnosis of influenza from Nasopharyngeal swab specimens and should not be used as a sole basis for treatment. Nasal washings and aspirates are unacceptable for Xpert Xpress SARS-CoV-2/FLU/RSV testing.  Fact Sheet for Patients: EntrepreneurPulse.com.au  Fact Sheet for Healthcare Providers: IncredibleEmployment.be  This test is not yet approved or cleared by the [place] FDA and has been authorized for detection and/or diagnosis of SARS-CoV-2 by FDA under an Emergency Use Authorization (EUA). This EUA will remain in effect (meaning this test can be used) for the duration of the COVID-19 declaration under Section 564(b)(1) of the Act, 21 U.S.C. section 360bbb-3(b)(1), unless the authorization is terminated or revoked.  Performed at [facility], [place] [address] [place], [place] [address]   Blood culture (routine x 2)     Status: None (Preliminary result)   Collection Time: 04/24/21  7:42 AM   Specimen: BLOOD RIGHT FOREARM  Result Value Ref Range Status   Specimen Description BLOOD RIGHT FOREARM  Final   Special Requests   Final    BOTTLES DRAWN AEROBIC AND ANAEROBIC Blood Culture adequate volume   Culture   Final    NO GROWTH < 24 HOURS Performed at [facility], [place] [address] [place], [place] [address]    Report Status PENDING  Incomplete  Blood culture (routine x 2)     Status: None (Preliminary result)    Collection Time: 04/24/21  7:44 AM   Specimen: BLOOD  Result Value Ref Range Status   Specimen Description BLOOD LEFT ANTECUBITAL  Final   Special Requests   Final    BOTTLES DRAWN AEROBIC AND ANAEROBIC Blood Culture adequate volume  Culture   Final    NO GROWTH < 24 HOURS Performed at [facility], [place] [address], [place], [place] [address]    Report Status PENDING  Incomplete     Radiology Studies: MR BRAIN WO CONTRAST  Result Date: 04/24/2021 CLINICAL DATA:  Stroke follow-up EXAM: MRI HEAD WITHOUT CONTRAST TECHNIQUE: Multiplanar, multiecho pulse sequences of the brain and surrounding structures were obtained without intravenous contrast. COMPARISON:  CT head 04/24/2021 FINDINGS: Brain: No acute infarction, hemorrhage, hydrocephalus, extra-axial collection or mass lesion. Minimal white matter changes. Mild cerebral atrophy. Vascular: Normal arterial flow voids at the skull base. Skull and upper cervical spine: No focal skeletal lesion. Sinuses/Orbits: Paranasal sinuses clear. Bilateral cataract extraction Other: None IMPRESSION: No acute abnormality. Electronically Signed   By: Franchot Gallo M.D.   On: 04/24/2021 17:47    Marzetta Board, MD, PhD [facility]  Between 7 am - 7 pm I am available, please contact me via Amion (for emergencies) or Securechat (non urgent messages)  Between 7 pm - 7 am I am not available, please contact night coverage MD/APP via Amion

## 2021-04-25 NOTE — Plan of Care (Signed)

## 2021-04-25 NOTE — Significant Event (Signed)
I was notified by patient's nurse that patient was tachycardic.  On arrival at bedside EKG noted.  Patient blood pressure was 150/60 patient is asymptomatic.  Heart rate was 135 regular narrow QRS on the monitor.  Patient afebrile.  Denies any chest pain or shortness of breath.  Patient EKG was concerning for SVT discussed with on-call cardiologist Dr. Clayton Bibles who advises giving adenosine.  I ordered a 1 dose of 6 mg IV adenosine following which patient's rhythm converted to sinus rhythm for 2 minutes then again went back into SVT.  At this point I gave 12 mg IV adenosine which again converted back to sinus but went back to SVT again.  Cardiologist recommended getting IV 5 mg metoprolol and p.o. metoprolol 12.5.  Which has been ordered.  We will closely monitor.  Repeat labs are also pending.  Adrian Olson

## 2021-04-25 NOTE — ED Notes (Signed)
Breakfast order placed ?

## 2021-04-25 NOTE — Consult Note (Addendum)
The patient has been seen in conjunction with Adrian Kicks, NP-C. All aspects of care have been considered and discussed. The patient has been personally interviewed, examined, and all clinical data has been reviewed.  The patient had syncope or near syncope at Thanksgiving with subsequent injury. Most recent episode of inability to get out of bed associated with incontinence. Etiology of episodes are unclear and not explained by SVT unless there is severe hypotension associated which I doubt. Consider neurologic basis. Also concerned that hyponatremia could be involved. ? SIADH related to pulmonary process.      Cardiology Consultation:   Patient ID: Adrian Olson MRN: [id number]; DOB: Jul 09, 1930  Admit date: 04/24/2021 Date of Consult: 04/25/2021  PCP:  Merrilee Seashore, MD   [facility] Providers Cardiologist:  [name], MD        Patient Profile:   Adrian Olson is a 87 y.o. male with a hx of HTN, HLD, 2DM, anxiety/depression, MVP and MR s/p TEER 10/09/19 who is being seen 04/25/2021 for the evaluation of SVT/and syncope at the request of Dr. Cruzita Lederer.  History of Present Illness:   Mr. Adrian Olson with above hx and successful transcatheter edge to edge repair using 2 MitraClip XTW devices, first device positioned A2 P2, second device positioned A1 P1. Post op echo showed EF 60-65%, mod pulm HTN, stable mitral valve repair with mild to moderate residual MR, mean gradient 5.5 mmHg.  Last echo 11/2020  EF 55-60% RVSP is 62.8 mm Hg. Moderate to severe mitral valve regurgitation.  Moderate mitral stenosis. The mean mitral valve gradient is 6.5 mmHg with average heart rate of 65 bpm.  Carotid doppler 40-59% stenosis bilaterally.  Recently seen in office 04/11/21 for falling back while sitting on bed hitting dresser.  No loss of consciousness.  For last month pt with intermittent dizziness only with standing.   He saw PCP CXR revealed fx ribs and had freq PVCs.  He was  also dealing with balance issues. He was found to orthostatic hypotension.  Compression stockings added, stopped HCTZ.  Was to wear zio patch for PVC burden.  Balance issues for almost 3 months usually when first up and about, he can then walk 3 miles without problems.    Zio patch SR with 1 run NSVT of 6 beats.  HE 148 to 108.  60 SVT runs 4 beats for fastest.  Though one episode 18 sec.  Isolated VEs were freq (13.7%, 41,885) occ couplets    Pt admitted 04/24/21 after getting up to go to [facility] -was not able to get up his wife was trying to help but pt with AMS, and became stiff/tense, unresponsive.  + incontinent of urine, slow to respond upon EMS arrival.  Sp02 on arrival was 85%- Since OV 04/11/21 he has been SOB and getting weaker.  On today's ER visit he was found to have Rt rib fx, 6-11 with large Rt pl effusion and small Lt pl effusion.    MRI of brain with mild cerebral atrophy, normal arterial flow at skull base-no acute abnormality. CT head without acute injury.  CTA chest neg PE, mod to large Rt pl effusion, no pneumothorax   EKG:  The EKG was personally reviewed and demonstrates:  initial SR at 43 freq PVCs LVH, non specific ST but no acute changes from 11/2020 except faster rate.  Follow up 020012/12/22 with SVT at 136, appears retrograde P wave.   Non specific ST changes. Telemetry:  Telemetry was personally  reviewed and demonstrates:  SR to SB and PACs  Once here at 0200 today he had SVT at 135, with retrograde P wave he rec'd adenosine 6 mg which broke the rhythm then back into it and rec;d 12 mg adenosine, then broke but went back into it and rec'd 5 mg IV lopressor and then 12.5 po   Na 123, K+ 4.5 BUN 21 Cr 1.33 on admit now Na 126, k+3.8 Cr 1.20 Mg+ 1.8  AST 114, ALT 110  Hs troponin 79; 135 Lactic acid 2.6  repeat 1.7 and now 1.3  Hgb 11.6, WBC 8.2 plts 247 TSH 9.172  EMS run sheet BP on arrival 200/96,  90 R 26, alert glucose 249   SR with PVCs freq at times, some  couplets.  BP 158/84 to 119/63 P 64 to 55 R 21 to 13   He rec'd lasix 40 once for low sp02, pl effusion and is neg 2,800 cc  Just had thoracentesis with 1400cc bloody fluid removed.  No chest pain anytime recently.  Has been increasing SOB.   Orthostatic BP Lying 141/89 P 83 Sitting 124/74 P-92 Standing 132/80 P 91 Standing at 3 min 146/77 P 92     Past Medical History:  Diagnosis Date   Bilateral carotid artery disease (HCC)    Severe Left and moderate Right   Diabetes mellitus without complication [facility])    ED (erectile dysfunction)    Hyperlipidemia    Hypertension    Lumbar disc disease    Mitral valve prolapse    moderate to severe MR, LVEF 55% Echo 2010   Moderate mitral regurgitation 03/16/2014   S/P mitral valve clip implantation 10/09/2019   s/p TEER using 2 MitraClip XTW devices, first device positioned A2 P2, second device positioned A1 P1 by Dr. Burt Knack    Past Surgical History:  Procedure Laterality Date   CARDIAC CATHETERIZATION     MITRAL VALVE REPAIR  10/09/2019   MITRAL VALVE REPAIR (N/A )   MITRAL VALVE REPAIR N/A 10/09/2019   Procedure: MITRAL VALVE REPAIR;  Surgeon: Sherren Mocha, MD;  Location: [place] [facility];  Service: Cardiovascular;  Laterality: N/A;   RIGHT/LEFT HEART CATH AND CORONARY ANGIOGRAPHY N/A 09/16/2019   Procedure: RIGHT/LEFT HEART CATH AND CORONARY ANGIOGRAPHY;  Surgeon: Belva Crome, MD;  Location: [place] [facility];  Service: Cardiovascular;  Laterality: N/A;   TEE WITHOUT CARDIOVERSION N/A 09/16/2019   Procedure: TRANSESOPHAGEAL ECHOCARDIOGRAM (TEE);  Surgeon: Lelon Perla, MD;  Location[facility] ENDOSCOPY;  Service: Cardiovascular;  Laterality: N/A;     Home Medications:  Prior to Admission medications   Medication Sig Start Date End Date Taking? Authorizing Provider  acetaminophen (TYLENOL) 500 MG tablet Take 1,000 mg by mouth every 6 (six) hours as needed (for pain.).   Yes [provider]  amoxicillin (AMOXIL) 500 MG  tablet Take 4 capsules (2,000 mg) one hour prior to all dental visits. 10/15/19  Yes Eileen Stanford, PA-C  aspirin 81 MG EC tablet Take 81 mg by mouth daily.   Yes [provider]  Cholecalciferol (VITAMIN D) 2000 UNITS tablet Take 2,000 Units by mouth daily at 12 noon.    Yes [provider]  Cyanocobalamin (VITAMIN B-12) 2000 MCG TBCR 1 tablet   Yes [provider]  finasteride (PROSCAR) 5 MG tablet Take 5 mg by mouth daily at 12 noon.    Yes [provider]  HYDROcodone-acetaminophen (NORCO) 7.5-325 MG tablet Take 1 tablet by mouth daily as  needed for moderate pain. 04/21/21  Yes [provider]  hydrocortisone cream 1 % Apply 1 application topically as needed for itching.   Yes [provider]  levothyroxine (SYNTHROID, LEVOTHROID) 75 MCG tablet Take 75 mcg by mouth daily before breakfast.   Yes [provider]  metFORMIN (GLUCOPHAGE) 500 MG tablet Take 500 mg by mouth every evening.  04/23/16  Yes [provider]  pravastatin (PRAVACHOL) 20 MG tablet Take 20 mg by mouth at bedtime.    Yes [provider]  psyllium (METAMUCIL) 58.6 % packet Take 1 packet by mouth daily.   Yes [provider]  ramipril (ALTACE) 5 MG capsule Take 5 mg by mouth daily.    Yes [provider]  sodium chloride (OCEAN) 0.65 % SOLN nasal spray Place 1 spray into both nostrils daily as needed for congestion.   Yes [provider]  tamsulosin (FLOMAX) 0.4 MG CAPS capsule Take 0.4 mg by mouth daily at 12 noon.  04/13/18  Yes [provider]  Thiamine HCl (VITAMIN B1 PO) Take 125 mcg by mouth daily.   Yes [provider]  traMADol (ULTRAM) 50 MG tablet Take 50 mg by mouth 2 (two) times daily as needed. 04/18/21  Yes [provider]  chlordiazePOXIDE (LIBRIUM) 10 MG capsule Take 1 tablet by mouth daily. Patient not taking: Reported on 04/24/2021 07/15/09   [provider]     Inpatient Medications: Scheduled Meds:  adenosine       aspirin EC  81 mg Oral Daily   enoxaparin (LOVENOX) injection  40 mg Subcutaneous Q24H   finasteride  5 mg Oral Q1200   metoprolol tartrate  12.5 mg Oral BID   pravastatin  20 mg Oral QHS   psyllium  1 packet Oral Daily   senna-docusate  1 tablet Oral QHS   sodium chloride flush  3 mL Intravenous Q12H   tamsulosin  0.4 mg Oral Q1200   Continuous Infusions:  PRN Meds: acetaminophen **OR** acetaminophen, albuterol, HYDROcodone-acetaminophen, ondansetron **OR** ondansetron (ZOFRAN) IV, sorbitol, milk of mag, mineral oil, glycerin (SMOG) enema, traMADol  Allergies:    Allergies  Allergen Reactions   Duloxetine Hcl Other (See Comments)    withdrawal    Social History:   Social History   Socioeconomic History   Marital status: Married    Spouse name: Not on file   Number of children: Not on file   Years of education: Not on file   Highest education level: Not on file  Occupational History   Not on file  Tobacco Use   Smoking status: Never   Smokeless tobacco: Never  Vaping Use   Vaping Use: Never used  Substance and Sexual Activity   Alcohol use: No    Alcohol/week: 0.0 standard drinks   Drug use: No   Sexual activity: Not on file  Other Topics Concern   Not on file  Social History Narrative   Not on file   Social Determinants of Health   Financial Resource Strain: Not on file  Food Insecurity: Not on file  Transportation Needs: Not on file  Physical Activity: Not on file  Stress: Not on file  Social Connections: Not on file  Intimate Partner Violence: Not on file    Family History:    Family History  Problem Relation Age of Onset   Cancer Mother        ovarian   Diabetes Father    Other Father  enlarged prostate   Stroke Father    Heart disease Father    Heart disease Sister    Obesity Sister    Dementia Sister    Diabetes Sister    Other Sister        nornmal old age problems      ROS:  Please see the history of present illness.  General:no colds or fevers, no weight changes Skin:no rashes or ulcers HEENT:no blurred vision, no congestion CV:see HPI PUL:see HPI GI:no diarrhea constipation or melena, no indigestion GU:no hematuria, no dysuria MS:no joint pain, no claudication Neuro:+ syncope, no lightheadedness Endo:no diabetes, + thyroid disease  All other ROS reviewed and negative.     Physical Exam/Data:   Vitals:   04/25/21 0900 04/25/21 0915 04/25/21 0930 04/25/21 0945  BP: (!) 156/80 (!) 141/80 136/86 (!) 158/84  Pulse: 63 62 60 61  Resp: 18 15 (!) 21 (!) 22  Temp:      TempSrc:      SpO2: 99% 98% 94% 98%  Weight:      Height:        Intake/Output Summary (Last 24 hours) at 04/25/2021 1117 Last data filed at 04/25/2021 0224 Gross per 24 hour  Intake --  Output 2800 ml  Net -2800 ml   Last 3 Weights 04/24/2021 04/11/2021 12/01/2020  Weight (lbs) 145 lb 145 lb 9.6 oz 145 lb  Weight (kg) 65.772 kg 66.044 kg 65.772 kg     Body mass index is 21.41 kg/m.  General:  thin elderly male, in no acute distress feels better after thoracentesis HEENT: normal Neck: no JVD Vascular: No carotid bruits; Distal pulses 2+ bilaterally Cardiac:  normal S1, S2; RRR; 3/6 systolic murmur no gallup or rub Lungs:  clear to auscultation bilaterally, no wheezing, rhonchi few rales  Abd: soft, nontender, no hepatomegaly  Ext: no edema Musculoskeletal:  No deformities, BUE and BLE strength normal and equal Skin: warm and dry  Neuro:  alert and oriented X 3 MAE follows commands, no focal abnormalities noted Psych:  Normal affect   Relevant CV Studies: Echo 12/01/20 IMPRESSIONS     1. Left ventricular ejection fraction, by estimation, is 55 to 60%. The  left ventricle has normal function. The left ventricle has no regional  wall motion abnormalities. Left ventricular diastolic function could not  be evaluated.   2. Right ventricular systolic function is  normal. The right ventricular  size is mildly enlarged. There is severely elevated pulmonary artery  systolic pressure. The estimated right ventricular systolic pressure is  73.5 mmHg.   3. Left atrial size was severely dilated.   4. Right atrial size was moderately dilated.   5. At least two discrete MR jets seen. In general MR s/p 2 clips appears  moderate, but on image 72, one of the jets appears likely severe due to  Coanda effect.. The mitral valve has been repaired/replaced. Moderate to  severe mitral valve regurgitation.  Moderate mitral stenosis. The mean mitral valve gradient is 6.5 mmHg with  average heart rate of 65 bpm. There is a Mitra-Clip present in the mitral  position. Procedure Date: 10/09/2019.   6. Tricuspid valve regurgitation is moderate to severe.   7. The aortic valve is tricuspid. There is mild calcification of the  aortic valve. There is mild thickening of the aortic valve. Aortic valve  regurgitation is mild to moderate. Aortic regurgitation PHT measures 526  msec.   8. The inferior vena cava is dilated in size with >  50% respiratory  variability, suggesting right atrial pressure of 8 mmHg.   Comparison(s): Prior images reviewed side by side.   Conclusion(s)/Recommendation(s): Valvular findings as outlined below. S/P  prior Mitraclip. Images from study dated 11/06/19 viewed side by side.  Overall visually study appears similar to prior, but on current study  there is better visualization of an MR  jet with Coanda effect, concerning for severe MR. On review of prior, this  jet was only partially seen, but there does appear to be some possible  Coanda effect on that study as well. This suggests that overall the study  is stable from 2021.   FINDINGS   Left Ventricle: Left ventricular ejection fraction, by estimation, is 55  to 60%. The left ventricle has normal function. The left ventricle has no  regional wall motion abnormalities. The left ventricular  internal cavity  size was normal in size. There is   no left ventricular hypertrophy. Left ventricular diastolic function  could not be evaluated due to mitral valve repair. Left ventricular  diastolic function could not be evaluated.   Right Ventricle: The right ventricular size is mildly enlarged. Right  vetricular wall thickness was not well visualized. Right ventricular  systolic function is normal. There is severely elevated pulmonary artery  systolic pressure. The tricuspid  regurgitant velocity is 3.70 m/s, and with an assumed right atrial  pressure of 8 mmHg, the estimated right ventricular systolic pressure is  76.2 mmHg.   Left Atrium: Left atrial size was severely dilated.   Right Atrium: Right atrial size was moderately dilated.   Pericardium: There is no evidence of pericardial effusion.   Mitral Valve: At least two discrete MR jets seen. In general MR s/p 2  clips appears moderate, but on image 72, one of the jets appears likely  severe due to Coanda effect. The mitral valve has been repaired/replaced.  Moderate to severe mitral valve  regurgitation. There is a Mitra-Clip present in the mitral position.  Procedure Date: 10/09/2019. Moderate mitral valve stenosis. MV peak  gradient, 21.9 mmHg. The mean mitral valve gradient is 6.5 mmHg with  average heart rate of 65 bpm.   Tricuspid Valve: The tricuspid valve is normal in structure. Tricuspid  valve regurgitation is moderate to severe. No evidence of tricuspid  stenosis.    Rt and Lt cardiac cath 09/16/19 Documented severe mitral regurgitation due to flail of the P2 segment.  Large left atrium. Mean pulmonary wedge pressure 14 mmHg with V wave spiking to 36 mmHg. Normal left ventricular systolic function.  MR not graded because LV gram was performed by simple hand injection. Normal left main Large left anterior descending that wraps around the left ventricular apex.  Proximal vessel contains eccentric 40% narrowing  from ostium to proximal segment.  The mid segment after the dominant diagonal contains eccentric 60 to 70% narrowing.  LAD diagonal forms a [place] 010 stenosis configuration. Normal circumflex.  Circumflex is relatively small. Large ramus intermedius gives origin to the large first septal perforator. Dominant, RCA with minimal proximal luminal irregularities.   RECOMMENDATIONS:   Moderate LAD coronary disease which can be treated medically until or if he develops anginal symptoms. He is being referred to the valve clinic/Dr. Roxy Manns for consideration of mitral valve surgery/MitraClip.  Flowsheet Row Most Recent Value  Fick Cardiac Output 6.62 L/min  Fick Cardiac Output Index 3.67 (L/min)/BSA  RA A Wave 10 mmHg  RA V Wave 0 mmHg  RA Mean 0 mmHg  RV Systolic Pressure  35 mmHg  RV Diastolic Pressure -1 mmHg  RV EDP 4 mmHg  PA Systolic Pressure 40 mmHg  PA Diastolic Pressure 6 mmHg  PA Mean 23 mmHg  PW A Wave 13 mmHg  PW V Wave 36 mmHg  PW Mean 14 mmHg  AO Systolic Pressure 542 mmHg  AO Diastolic Pressure 54 mmHg  AO Mean 87 mmHg  LV Systolic Pressure 706 mmHg  LV Diastolic Pressure 0 mmHg  LV EDP 14 mmHg  AOp Systolic Pressure 237 mmHg  AOp Diastolic Pressure 55 mmHg  AOp Mean Pressure 89 mmHg  LVp Systolic Pressure 628 mmHg  LVp Diastolic Pressure 5 mmHg  LVp EDP Pressure 14 mmHg  QP/QS 1  TPVR Index 6.28 HRUI  TSVR Index 23.71 HRUI  TPVR/TSVR Ratio    Laboratory Data:  High Sensitivity Troponin:   Recent Labs  Lab 04/24/21 0555 04/24/21 0840  TROPONINIHS 79* 135*     Chemistry Recent Labs  Lab 04/24/21 0555 04/24/21 0747 04/24/21 0748 04/24/21 2341 04/25/21 0327 04/25/21 0339  NA 123*   < > 126* 126* 126*  --   K 4.5   < > 4.5 4.0 3.8  --   CL 92*  --  94* 94* 94*  --   CO2 20*  --   --  22 20*  --   GLUCOSE 178*  --  140* 181* 145*  --   BUN 21  --  22 23 21   --   CREATININE 1.33*  --  1.20 1.22 1.20  --   CALCIUM 9.1  --   --  9.2 9.0  --   MG  --   --    --   --   --  1.8  GFRNONAA 51*  --   --  57* 58*  --   ANIONGAP 11  --   --  10 12  --    < > = values in this interval not displayed.    Recent Labs  Lab 04/24/21 0555  PROT 6.5  ALBUMIN 3.5  AST 114*  ALT 110*  ALKPHOS 116  BILITOT 0.7   Lipids No results for input(s): CHOL, TRIG, HDL, LABVLDL, LDLCALC, CHOLHDL in the last 168 hours.  Hematology Recent Labs  Lab 04/24/21 0555 04/24/21 0747 04/24/21 0748 04/25/21 0327  WBC 8.2  --   --  8.0  RBC 3.58*  --   --  3.58*  HGB 11.6* 11.6* 12.2* 11.7*  HCT 34.8* 34.0* 36.0* 34.0*  MCV 97.2  --   --  95.0  MCH 32.4  --   --  32.7  MCHC 33.3  --   --  34.4  RDW 13.2  --   --  13.5  PLT 247  --   --  251   Thyroid  Recent Labs  Lab 04/25/21 0339  TSH 9.172*    BNPNo results for input(s): BNP, PROBNP in the last 168 hours.  DDimer No results for input(s): DDIMER in the last 168 hours.   Radiology/Studies:  CT HEAD WO CONTRAST  Result Date: 04/24/2021 CLINICAL DATA:  87 year old male new onset seizure. Became unresponsive. EXAM: CT HEAD WITHOUT CONTRAST TECHNIQUE: Contiguous axial images were obtained from the base of the skull through the vertex without intravenous contrast. COMPARISON:  None. FINDINGS: Brain: Cerebral volume is within normal limits for age. No midline shift, ventriculomegaly, mass effect, evidence of mass lesion, intracranial hemorrhage or evidence of cortically based acute infarction. Mild dystrophic calcifications bilateral basal ganglia.  Gray-white matter differentiation within normal limits for age. No cortical encephalomalacia identified. Vascular: Calcified atherosclerosis at the skull base. No suspicious intracranial vascular hyperdensity. Skull: No fracture identified. Sinuses/Orbits: Visualized paranasal sinuses and mastoids are well aerated. Other: No discrete orbit or scalp soft tissue injury identified. IMPRESSION: Normal for age non contrast CT appearance of the brain. No acute traumatic injury  identified. Electronically Signed   By: Genevie Ann M.D.   On: 04/24/2021 07:39   CT Angio Chest PE W and/or Wo Contrast  Result Date: 04/24/2021 CLINICAL DATA:  Possible pulmonary embolism. Syncope at home when getting up to go to the bathroom. EXAM: CT ANGIOGRAPHY CHEST CT ABDOMEN AND PELVIS WITH CONTRAST TECHNIQUE: Multidetector CT imaging of the chest was performed using the standard protocol during bolus administration of intravenous contrast. Multiplanar CT image reconstructions and MIPs were obtained to evaluate the vascular anatomy. Multidetector CT imaging of the abdomen and pelvis was performed using the standard protocol during bolus administration of intravenous contrast. CONTRAST:  177mL OMNIPAQUE IOHEXOL 350 MG/ML SOLN COMPARISON:  CT abdomen/pelvis 10/08/2008 FINDINGS: CTA CHEST FINDINGS Cardiovascular: Mild-to-moderate cardiomegaly. Prosthetic mitral valve noted. Mild calcified plaque over the left main and right coronary arteries. Ascending thoracic aorta measures approximately 3.6 cm in AP diameter. Calcified plaque over the descending thoracic aorta. Pulmonary arterial system is well opacified and demonstrates no evidence of emboli. Mediastinum/Nodes: No definite mediastinal or hilar adenopathy. Remaining mediastinal structures are unremarkable. Lungs/Pleura: Moderate moderate to large right pleural fluid collection and small to moderate left pleural fluid collection with associated atelectatic change within the lung bases. No pneumothorax. Airways are unremarkable. Musculoskeletal: Numerous right posterior and lateral rib fractures involving ribs 6 through 11. Review of the MIP images confirms the above findings. CT ABDOMEN and PELVIS FINDINGS Hepatobiliary: Liver and biliary tree are normal. Tiny amount of adjacent pericholecystic fluid as the gallbladder is unremarkable. Pancreas: Normal. Spleen: Normal. Adrenals/Urinary Tract: Adrenal glands are normal. Kidneys are normal in size without  focal mass or hydronephrosis. Ureters and bladder are normal. Stomach/Bowel: Stomach and small bowel are normal. Diverticulosis of the colon with mild fecal retention throughout the colon. Appendix not visualized. Vascular/Lymphatic: Mild-to-moderate calcified plaque over the abdominal aorta which is normal in caliber. No adenopathy. Reproductive: Mild prostatic enlargement with moderate pressure upon the bladder base. Other: No significant free peritoneal fluid and no free peritoneal air. Small right inguinal hernia containing peritoneal fat and tiny amount of fluid. Musculoskeletal: Degenerative change of the spine. No acute fracture. Review of the MIP images confirms the above findings. IMPRESSION: 1. No evidence of pulmonary embolism. 2. Moderate to large right pleural fluid collection and small to moderate left pleural fluid collection with associated bibasilar atelectasis. No pneumothorax. Multiple associated mildly displaced and slightly comminuted fractures of posterolateral right ribs 6 through 11. 3. No acute findings in the abdomen/pelvis. 4. Colonic diverticulosis without active inflammation. 5. Mild prostatic enlargement with moderate impression upon the bladder base. 6. Small right inguinal hernia containing peritoneal fat and tiny amount of fluid. 7. Aortic atherosclerosis. Atherosclerotic coronary artery disease. Aortic Atherosclerosis (ICD10-I70.0). Electronically Signed   By: Marin Olp M.D.   On: 04/24/2021 09:09   MR BRAIN WO CONTRAST  Result Date: 04/24/2021 CLINICAL DATA:  Stroke follow-up EXAM: MRI HEAD WITHOUT CONTRAST TECHNIQUE: Multiplanar, multiecho pulse sequences of the brain and surrounding structures were obtained without intravenous contrast. COMPARISON:  CT head 04/24/2021 FINDINGS: Brain: No acute infarction, hemorrhage, hydrocephalus, extra-axial collection or mass lesion. Minimal white matter changes. Mild cerebral  atrophy. Vascular: Normal arterial flow voids at the skull  base. Skull and upper cervical spine: No focal skeletal lesion. Sinuses/Orbits: Paranasal sinuses clear. Bilateral cataract extraction Other: None IMPRESSION: No acute abnormality. Electronically Signed   By: Franchot Gallo M.D.   On: 04/24/2021 17:47   CT ABDOMEN PELVIS W CONTRAST  Result Date: 04/24/2021 CLINICAL DATA:  Possible pulmonary embolism. Syncope at home when getting up to go to the bathroom. EXAM: CT ANGIOGRAPHY CHEST CT ABDOMEN AND PELVIS WITH CONTRAST TECHNIQUE: Multidetector CT imaging of the chest was performed using the standard protocol during bolus administration of intravenous contrast. Multiplanar CT image reconstructions and MIPs were obtained to evaluate the vascular anatomy. Multidetector CT imaging of the abdomen and pelvis was performed using the standard protocol during bolus administration of intravenous contrast. CONTRAST:  125mL OMNIPAQUE IOHEXOL 350 MG/ML SOLN COMPARISON:  CT abdomen/pelvis 10/08/2008 FINDINGS: CTA CHEST FINDINGS Cardiovascular: Mild-to-moderate cardiomegaly. Prosthetic mitral valve noted. Mild calcified plaque over the left main and right coronary arteries. Ascending thoracic aorta measures approximately 3.6 cm in AP diameter. Calcified plaque over the descending thoracic aorta. Pulmonary arterial system is well opacified and demonstrates no evidence of emboli. Mediastinum/Nodes: No definite mediastinal or hilar adenopathy. Remaining mediastinal structures are unremarkable. Lungs/Pleura: Moderate moderate to large right pleural fluid collection and small to moderate left pleural fluid collection with associated atelectatic change within the lung bases. No pneumothorax. Airways are unremarkable. Musculoskeletal: Numerous right posterior and lateral rib fractures involving ribs 6 through 11. Review of the MIP images confirms the above findings. CT ABDOMEN and PELVIS FINDINGS Hepatobiliary: Liver and biliary tree are normal. Tiny amount of adjacent pericholecystic  fluid as the gallbladder is unremarkable. Pancreas: Normal. Spleen: Normal. Adrenals/Urinary Tract: Adrenal glands are normal. Kidneys are normal in size without focal mass or hydronephrosis. Ureters and bladder are normal. Stomach/Bowel: Stomach and small bowel are normal. Diverticulosis of the colon with mild fecal retention throughout the colon. Appendix not visualized. Vascular/Lymphatic: Mild-to-moderate calcified plaque over the abdominal aorta which is normal in caliber. No adenopathy. Reproductive: Mild prostatic enlargement with moderate pressure upon the bladder base. Other: No significant free peritoneal fluid and no free peritoneal air. Small right inguinal hernia containing peritoneal fat and tiny amount of fluid. Musculoskeletal: Degenerative change of the spine. No acute fracture. Review of the MIP images confirms the above findings. IMPRESSION: 1. No evidence of pulmonary embolism. 2. Moderate to large right pleural fluid collection and small to moderate left pleural fluid collection with associated bibasilar atelectasis. No pneumothorax. Multiple associated mildly displaced and slightly comminuted fractures of posterolateral right ribs 6 through 11. 3. No acute findings in the abdomen/pelvis. 4. Colonic diverticulosis without active inflammation. 5. Mild prostatic enlargement with moderate impression upon the bladder base. 6. Small right inguinal hernia containing peritoneal fat and tiny amount of fluid. 7. Aortic atherosclerosis. Atherosclerotic coronary artery disease. Aortic Atherosclerosis (ICD10-I70.0). Electronically Signed   By: Marin Olp M.D.   On: 04/24/2021 09:09     Assessment and Plan:   Syncope with stiffness - CT and MRI of head without issue, he had been dizzy recently and did have orthostatic hypotension.  HCTZ was stopped. Now mild drop from lying to sitting only,Was incontinent of urine.  May need neuro consult for possible seizure, Na was 123 on arrival   Recent  dizziness with holter and 1 run NSVT of 6 beats.  HE 148 to 108.  60 SVT runs 4 beats for fastest.  Though one episode 18 sec.  Isolated  VEs were freq (13.7%, 41,885) occ couplets  now with SVT in ER rate of 135   also on zio patch  + PVCs isolated freq 13.7% of 41,885 beats also freq couplets  Recent fall without loss of consciousness, saw PCP.  On CT here has fx rib and large pl effusion,  CCM following and just had thoracentesis 1400 cc removed. Lasix given as well.   Hx of MVP and mitra clip 09/16/19.  Last echo mod to severe MR--Moderate mitral stenosis. The mean mitral valve gradient is 6.5 mmHg with average heart rate of 65 bpm.  Carotid doppler 40-59% stenosis bilaterally repeat echo.   Hyponatremia per IM  Ckd-3 baseline Cr 1-1.2 near baseline.  Hypothyroidism with TSH of 9 IM following  CAD non obstructing with mod LAD disease.  No chest pain.   Risk Assessment/Risk Scores:                For questions or updates, please contact [place] Please consult [email or web address] for contact info under    Signed, Adrian Kicks, NP  04/25/2021 11:17 AM

## 2021-04-25 NOTE — TOC CAGE-AID Note (Signed)
Transition of Care [facility]) - CAGE-AID Screening   Patient Details  Name: Adrian Olson MRN: [id number] Date of Birth: [birth year]  Transition of Care [facility]) CM/SW Contact:    Bethann Berkshire, [place] Phone Number: 04/25/2021, 2:24 PM   Clinical Narrative:  CAGE-AID completed; score of 0. Pt states he does not drink alcohol or use any other substances.   CAGE-AID Screening:    Have You Ever Felt You Ought to Cut Down on Your Drinking or Drug Use?: No Have People Annoyed You By Critizing Your Drinking Or Drug Use?: No Have You Felt Bad Or Guilty About Your Drinking Or Drug Use?: No Have You Ever Had a Drink or Used Drugs First Thing In The Morning to Steady Your Nerves or to Get Rid of a Hangover?: No CAGE-AID Score: 0  Substance Abuse Education Offered: No

## 2021-04-25 NOTE — ED Notes (Signed)
Dr. Hal Hope at bedside ordering adenosine for SVT. Second RN at bedside for assistance. Pt placed on zoll monitor. Charge RN aware. RT called to bedside.

## 2021-04-25 NOTE — Consult Note (Addendum)
[address]       [place] [address]             [phone]         Reason for Consult: Pneumothorax Referring Physician: PCCM  Adrian Olson is an 87 y.o. male.  HPI: The patient is an 87 year old male with a complex medical history who presented to the emergency department on 04/24/2021 after an episode of unresponsiveness following a fall at his home.  He was found to have a large hemothorax as well as multiple mildly displaced and slightly comminuted fractures of posterolateral ribs 6 through 11 on the right.  He was noted to have bilateral effusions with the right larger than the left.  He has been consulted by cardiology as well as PCCM and was admitted to the hospitalist service.  A right-sided thoracentesis was performed by PCCM and they obtained 1400 cc of fluid.  The fluid was noted to be bloody.  Laboratory values are pending from pleural studies.  PCCM felt as though he was at risk for persistent hemothorax due to the displaced rib fractures and we are asked to see in consultation.  He is also noted to have had a previous fall on Thanksgiving landing against his dresser.  He saw his primary care physician at that time as well as cardiologist and was noted to be orthostatic and his HCTZ was discontinued.  He has continued to have pain in his right chest with increasing shortness of breath and gradual loss of strength.  He remains in the emergency department and it is noted he had an episode of SVT overnight.  He received adenosine as well as Lopressor.  He is also being diuresed with Lasix.  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 Burt Knack    Past Surgical History:  Procedure Laterality Date   CARDIAC CATHETERIZATION     MITRAL VALVE REPAIR  10/09/2019   MITRAL VALVE REPAIR (N/A )   MITRAL VALVE REPAIR N/A 10/09/2019   Procedure: MITRAL VALVE REPAIR;  Surgeon: Sherren Mocha, MD;  Location: [facility];  Service: Cardiovascular;  Laterality: N/A;   RIGHT/LEFT HEART CATH AND CORONARY ANGIOGRAPHY N/A 09/16/2019   Procedure: RIGHT/LEFT HEART CATH AND CORONARY ANGIOGRAPHY;  Surgeon: Belva Crome, MD;  Location: [facility];  Service: Cardiovascular;  Laterality: N/A;   TEE WITHOUT CARDIOVERSION N/A 09/16/2019   Procedure: TRANSESOPHAGEAL ECHOCARDIOGRAM (TEE);  Surgeon: Lelon Perla, MD;  Location: [facility];  Service: Cardiovascular;  Laterality: N/A;    Family History  Problem Relation Age of Onset   Cancer Mother        ovarian   Diabetes Father    Other Father        enlarged prostate   Stroke Father    Heart disease Father    Heart disease Sister  Obesity Sister    Dementia Sister    Diabetes Sister    Other Sister        nornmal old age problems    Social History:  reports that he has never smoked. He has never used smokeless tobacco. He reports that he does not drink alcohol and does not use drugs.  Allergies:  Allergies  Allergen Reactions   Duloxetine Hcl Other (See Comments)    withdrawal    Medications: Scheduled Meds:  adenosine       aspirin EC  81 mg Oral Daily   enoxaparin (LOVENOX) injection  40 mg Subcutaneous Q24H   finasteride  5 mg Oral Q1200   metoprolol tartrate  12.5 mg Oral BID   pravastatin  20 mg Oral QHS   psyllium  1 packet Oral Daily   senna-docusate  1 tablet Oral QHS   sodium chloride flush  3 mL Intravenous Q12H   tamsulosin  0.4 mg Oral Q1200     Current Outpatient Medications  Medication Instructions   acetaminophen (TYLENOL) 1,000 mg, Oral, Every 6 hours PRN   amoxicillin (AMOXIL) 500 MG tablet Take 4 capsules (2,000 mg) one hour prior to all dental visits.   aspirin 81 mg, Oral, Daily   chlordiazePOXIDE (LIBRIUM) 10 MG capsule 1 tablet, Daily   Cyanocobalamin (VITAMIN B-12) 2000 MCG TBCR 1 tablet   finasteride (PROSCAR) 5 mg, Oral, Daily   HYDROcodone-acetaminophen (NORCO) 7.5-325 MG tablet 1 tablet, Oral, Daily PRN   hydrocortisone cream 1 % 1 application, Topical, As needed   levothyroxine (SYNTHROID) 75 mcg, Oral, Daily before breakfast   metFORMIN (GLUCOPHAGE) 500 mg, Oral, Every evening   pravastatin (PRAVACHOL) 20 mg, Oral, Daily at bedtime   psyllium (METAMUCIL) 58.6 % packet 1 packet, Oral, Daily   ramipril (ALTACE) 5 mg, Oral, Daily   sodium chloride (OCEAN) 0.65 % SOLN nasal spray 1 spray, Each Nare, Daily PRN   tamsulosin (FLOMAX) 0.4 mg, Oral, Daily   Thiamine HCl (VITAMIN B1 PO) 125 mcg, Oral, Daily   traMADol (ULTRAM) 50 mg, Oral, 2 times daily PRN   Vitamin D 2,000 Units, Oral, Daily    Continuous Infusions: PRN Meds:.acetaminophen **OR** acetaminophen, albuterol, HYDROcodone-acetaminophen, ondansetron **OR** ondansetron (ZOFRAN) IV, sorbitol, milk of mag, mineral oil, glycerin (SMOG) enema, traMADol   Results for orders placed or performed during the hospital encounter of 04/24/21 (from the past 48 hour(s))  Comprehensive metabolic panel     Status: Abnormal   Collection Time: 04/24/21  5:55 AM  Result Value Ref Range   Sodium 123 (L) 135 - 145 mmol/L   Potassium 4.5 3.5 - 5.1 mmol/L   Chloride 92 (L) 98 - 111 mmol/L   CO2 20 (L) 22 - 32 mmol/L   Glucose, Bld 178 (H) 70 - 99 mg/dL    Comment: Glucose reference range applies only to samples taken after fasting for at least 8 hours.   BUN 21 8 - 23 mg/dL   Creatinine, Ser 1.33 (H) 0.61 - 1.24 mg/dL   Calcium 9.1 8.9 - 10.3 mg/dL   Total Protein 6.5  6.5 - 8.1 g/dL   Albumin 3.5 3.5 - 5.0 g/dL   AST 114 (H) 15 - 41 U/L   ALT 110 (H) 0 - 44 U/L   Alkaline Phosphatase 116 38 - 126 U/L   Total Bilirubin 0.7 0.3 - 1.2 mg/dL   GFR, Estimated 51 (L) >60 mL/min    Comment: (NOTE) Calculated using the CKD-EPI Creatinine Equation (2021)  Anion gap 11 5 - 15    Comment: Performed at [facility] [address], [place], [place] [address]  CBC WITH DIFFERENTIAL     Status: Abnormal   Collection Time: 04/24/21  5:55 AM  Result Value Ref Range   WBC 8.2 4.0 - 10.5 K/uL   RBC 3.58 (L) 4.22 - 5.81 MIL/uL   Hemoglobin 11.6 (L) 13.0 - 17.0 g/dL   HCT 34.8 (L) 39.0 - 52.0 %   MCV 97.2 80.0 - 100.0 fL   MCH 32.4 26.0 - 34.0 pg   MCHC 33.3 30.0 - 36.0 g/dL   RDW 13.2 11.5 - 15.5 %   Platelets 247 150 - 400 K/uL   nRBC 0.0 0.0 - 0.2 %   Neutrophils Relative % 71 %   Neutro Abs 5.9 1.7 - 7.7 K/uL   Lymphocytes Relative 20 %   Lymphs Abs 1.6 0.7 - 4.0 K/uL   Monocytes Relative 8 %   Monocytes Absolute 0.6 0.1 - 1.0 K/uL   Eosinophils Relative 1 %   Eosinophils Absolute 0.1 0.0 - 0.5 K/uL   Basophils Relative 0 %   Basophils Absolute 0.0 0.0 - 0.1 K/uL   Immature Granulocytes 0 %   Abs Immature Granulocytes 0.03 0.00 - 0.07 K/uL    Comment: Performed at [facility], [address] [place], [place] [address]  Troponin I (High Sensitivity)     Status: Abnormal   Collection Time: 04/24/21  5:55 AM  Result Value Ref Range   Troponin I (High Sensitivity) 79 (H) <18 ng/L    Comment: (NOTE) Elevated high sensitivity troponin I (hsTnI) values and significant  changes across serial measurements may suggest ACS but many other  chronic and acute conditions are known to elevate hsTnI results.  Refer to the "Links" section for chest pain algorithms and additional  guidance. Performed at [facility], [place] [address] [place], [place] [address]   Lactic acid, plasma     Status: Abnormal   Collection Time: 04/24/21  5:56 AM   Result Value Ref Range   Lactic Acid, Venous 2.6 (HH) 0.5 - 1.9 mmol/L    Comment: CRITICAL RESULT CALLED TO, READ BACK BY AND VERIFIED WITH: Lucienne Capers RN BY SSTEPHENS [phone] Performed at [facility], [place] [address], [place], [place] [address]   Ethanol     Status: None   Collection Time: 04/24/21  5:56 AM  Result Value Ref Range   Alcohol, Ethyl (B) <10 <10 mg/dL    Comment: (NOTE) Lowest detectable limit for serum alcohol is 10 mg/dL.  For medical purposes only. Performed at [facility], [place] [address], [place], [place] [address]   Urinalysis, Routine w reflex microscopic     Status: Abnormal   Collection Time: 04/24/21  6:24 AM  Result Value Ref Range   Color, Urine YELLOW YELLOW   APPearance CLEAR CLEAR   Specific Gravity, Urine 1.014 1.005 - 1.030   pH 6.0 5.0 - 8.0   Glucose, UA NEGATIVE NEGATIVE mg/dL   Hgb urine dipstick SMALL (A) NEGATIVE   Bilirubin Urine NEGATIVE NEGATIVE   Ketones, ur NEGATIVE NEGATIVE mg/dL   Protein, ur 30 (A) NEGATIVE mg/dL   Nitrite NEGATIVE NEGATIVE   Leukocytes,Ua NEGATIVE NEGATIVE   RBC / HPF 11-20 0 - 5 RBC/hpf   WBC, UA 0-5 0 - 5 WBC/hpf   Bacteria, UA NONE SEEN NONE SEEN    Comment: Performed at [facility], [place] [address], [place], [place] [address]  CBG monitoring, ED     Status: Abnormal   Collection Time: 04/24/21  6:30 AM  Result Value Ref Range   Glucose-Capillary 154 (H) 70 - 99 mg/dL    Comment: Glucose reference range applies only to samples taken after fasting for at least 8 hours.  Resp Panel by RT-PCR (Flu A&B, Covid) Nasopharyngeal Swab     Status: None   Collection Time: 04/24/21  7:28 AM   Specimen: Nasopharyngeal Swab; Nasopharyngeal(NP) swabs in vial transport medium  Result Value Ref Range   SARS Coronavirus 2 by RT PCR NEGATIVE NEGATIVE    Comment: (NOTE) SARS-CoV-2 target nucleic acids are NOT DETECTED.  The SARS-CoV-2 RNA is generally detectable in upper respiratory specimens  during the acute phase of infection. The lowest concentration of SARS-CoV-2 viral copies this assay can detect is 138 copies/mL. A negative result does not preclude SARS-Cov-2 infection and should not be used as the sole basis for treatment or other patient management decisions. A negative result may occur with  improper specimen collection/handling, submission of specimen other than nasopharyngeal swab, presence of viral mutation(s) within the areas targeted by this assay, and inadequate number of viral copies(<138 copies/mL). A negative result must be combined with clinical observations, patient history, and epidemiological information. The expected result is Negative.  Fact Sheet for Patients:  EntrepreneurPulse.com.au  Fact Sheet for Healthcare Providers:  IncredibleEmployment.be  This test is no t yet approved or cleared by the [place] FDA and  has been authorized for detection and/or diagnosis of SARS-CoV-2 by FDA under an Emergency Use Authorization (EUA). This EUA will remain  in effect (meaning this test can be used) for the duration of the COVID-19 declaration under Section 564(b)(1) of the Act, 21 U.S.C.section 360bbb-3(b)(1), unless the authorization is terminated  or revoked sooner.       Influenza A by PCR NEGATIVE NEGATIVE   Influenza B by PCR NEGATIVE NEGATIVE    Comment: (NOTE) The Xpert Xpress SARS-CoV-2/FLU/RSV plus assay is intended as an aid in the diagnosis of influenza from Nasopharyngeal swab specimens and should not be used as a sole basis for treatment. Nasal washings and aspirates are unacceptable for Xpert Xpress SARS-CoV-2/FLU/RSV testing.  Fact Sheet for Patients: EntrepreneurPulse.com.au  Fact Sheet for Healthcare Providers: IncredibleEmployment.be  This test is not yet approved or cleared by the [place] FDA and has been authorized for detection and/or diagnosis  of SARS-CoV-2 by FDA under an Emergency Use Authorization (EUA). This EUA will remain in effect (meaning this test can be used) for the duration of the COVID-19 declaration under Section 564(b)(1) of the Act, 21 U.S.C. section 360bbb-3(b)(1), unless the authorization is terminated or revoked.  Performed at [facility], [place] [address], [place], [place] [address]   Blood culture (routine x 2)     Status: None (Preliminary result)   Collection Time: 04/24/21  7:42 AM   Specimen: BLOOD RIGHT FOREARM  Result Value Ref Range   Specimen Description BLOOD RIGHT FOREARM    Special Requests      BOTTLES DRAWN AEROBIC AND ANAEROBIC Blood Culture adequate volume   Culture      NO GROWTH < 24 HOURS Performed at [facility], [place] [address] [place], [place] [address]    Report Status PENDING   Blood culture (routine x 2)     Status: None (Preliminary result)   Collection Time: 04/24/21  7:44 AM  Specimen: BLOOD  Result Value Ref Range   Specimen Description BLOOD LEFT ANTECUBITAL    Special Requests      BOTTLES DRAWN AEROBIC AND ANAEROBIC Blood Culture adequate volume   Culture      NO GROWTH < 24 HOURS Performed at [facility], [place] [address] [place], [place] [address]    Report Status PENDING   I-Stat venous blood gas, ED     Status: Abnormal   Collection Time: 04/24/21  7:47 AM  Result Value Ref Range   pH, Ven 7.399 7.250 - 7.430   pCO2, Ven 37.7 (L) 44.0 - 60.0 mmHg   pO2, Ven 21.0 (LL) 32.0 - 45.0 mmHg   Bicarbonate 23.3 20.0 - 28.0 mmol/L   TCO2 24 22 - 32 mmol/L   O2 Saturation 36.0 %   Acid-base deficit 1.0 0.0 - 2.0 mmol/L   Sodium 127 (L) 135 - 145 mmol/L   Potassium 4.5 3.5 - 5.1 mmol/L   Calcium, Ion 1.19 1.15 - 1.40 mmol/L   HCT 34.0 (L) 39.0 - 52.0 %   Hemoglobin 11.6 (L) 13.0 - 17.0 g/dL   Sample type VENOUS    Comment NOTIFIED PHYSICIAN   I-Stat Chem 8, ED     Status: Abnormal   Collection Time: 04/24/21  7:48 AM  Result Value Ref Range    Sodium 126 (L) 135 - 145 mmol/L   Potassium 4.5 3.5 - 5.1 mmol/L   Chloride 94 (L) 98 - 111 mmol/L   BUN 22 8 - 23 mg/dL   Creatinine, Ser 1.20 0.61 - 1.24 mg/dL   Glucose, Bld 140 (H) 70 - 99 mg/dL    Comment: Glucose reference range applies only to samples taken after fasting for at least 8 hours.   Calcium, Ion 1.21 1.15 - 1.40 mmol/L   TCO2 22 22 - 32 mmol/L   Hemoglobin 12.2 (L) 13.0 - 17.0 g/dL   HCT 36.0 (L) 39.0 - 52.0 %  Troponin I (High Sensitivity)     Status: Abnormal   Collection Time: 04/24/21  8:40 AM  Result Value Ref Range   Troponin I (High Sensitivity) 135 (HH) <18 ng/L    Comment: CRITICAL RESULT CALLED TO, READ BACK BY AND VERIFIED WITH: Gareth Eagle RN BY SSTEPHENS [phone] [id number] (NOTE) Elevated high sensitivity troponin I (hsTnI) values and significant  changes across serial measurements may suggest ACS but many other  chronic and acute conditions are known to elevate hsTnI results.  Refer to the Links section for chest pain algorithms and additional  guidance. Performed at [facility], [place] [address], [place], [place] [address]   Lactic acid, plasma     Status: None   Collection Time: 04/24/21  8:49 AM  Result Value Ref Range   Lactic Acid, Venous 1.7 0.5 - 1.9 mmol/L    Comment: Performed at [facility] [address] [place], [place] [address]  Sodium, urine, random     Status: None   Collection Time: 04/24/21 12:35 PM  Result Value Ref Range   Sodium, Ur 53 mmol/L    Comment: Performed at [facility] [address] [place], [place] [address]  Osmolality, urine     Status: None   Collection Time: 04/24/21 12:35 PM  Result Value Ref Range   Osmolality, Ur 445 300 - 900 mOsm/kg    Comment: Performed at [facility], [place] [place], [place] [address]  Creatinine, urine, random  Status: None   Collection Time: 04/24/21 12:35 PM  Result Value Ref Range   Creatinine, Urine 49.09 mg/dL    Comment: Performed  at [facility], [place] [address] [place], [place] [address]  Basic metabolic panel     Status: Abnormal   Collection Time: 04/24/21 11:41 PM  Result Value Ref Range   Sodium 126 (L) 135 - 145 mmol/L   Potassium 4.0 3.5 - 5.1 mmol/L   Chloride 94 (L) 98 - 111 mmol/L   CO2 22 22 - 32 mmol/L   Glucose, Bld 181 (H) 70 - 99 mg/dL    Comment: Glucose reference range applies only to samples taken after fasting for at least 8 hours.   BUN 23 8 - 23 mg/dL   Creatinine, Ser 1.22 0.61 - 1.24 mg/dL   Calcium 9.2 8.9 - 10.3 mg/dL   GFR, Estimated 57 (L) >60 mL/min    Comment: (NOTE) Calculated using the CKD-EPI Creatinine Equation (2021)    Anion gap 10 5 - 15    Comment: Performed at [facility] [address], [place], [place] [address]  Lactic acid, plasma     Status: None   Collection Time: 04/25/21  3:10 AM  Result Value Ref Range   Lactic Acid, Venous 1.3 0.5 - 1.9 mmol/L    Comment: Performed at [facility] [address], [place], [place] [address]  CBC     Status: Abnormal   Collection Time: 04/25/21  3:27 AM  Result Value Ref Range   WBC 8.0 4.0 - 10.5 K/uL   RBC 3.58 (L) 4.22 - 5.81 MIL/uL   Hemoglobin 11.7 (L) 13.0 - 17.0 g/dL   HCT 34.0 (L) 39.0 - 52.0 %   MCV 95.0 80.0 - 100.0 fL   MCH 32.7 26.0 - 34.0 pg   MCHC 34.4 30.0 - 36.0 g/dL   RDW 13.5 11.5 - 15.5 %   Platelets 251 150 - 400 K/uL   nRBC 0.0 0.0 - 0.2 %    Comment: Performed at [facility], [place] [address] [place], [place] [address]  Basic metabolic panel     Status: Abnormal   Collection Time: 04/25/21  3:27 AM  Result Value Ref Range   Sodium 126 (L) 135 - 145 mmol/L   Potassium 3.8 3.5 - 5.1 mmol/L   Chloride 94 (L) 98 - 111 mmol/L   CO2 20 (L) 22 - 32 mmol/L   Glucose, Bld 145 (H) 70 - 99 mg/dL    Comment: Glucose reference range applies only to samples taken after fasting for at least 8 hours.   BUN 21 8 - 23 mg/dL   Creatinine, Ser 1.20 0.61 - 1.24 mg/dL   Calcium 9.0 8.9 -  10.3 mg/dL   GFR, Estimated 58 (L) >60 mL/min    Comment: (NOTE) Calculated using the CKD-EPI Creatinine Equation (2021)    Anion gap 12 5 - 15    Comment: Performed at [facility] [address], [place], [place] [address]  TSH     Status: Abnormal   Collection Time: 04/25/21  3:39 AM  Result Value Ref Range   TSH 9.172 (H) 0.350 - 4.500 uIU/mL    Comment: Performed by a 3rd Generation assay with a functional sensitivity of <=0.01 uIU/mL. Performed at [facility], [place] [address] [place], [place] [address]   Magnesium     Status: None   Collection Time: 04/25/21  3:39  AM  Result Value Ref Range   Magnesium 1.8 1.7 - 2.4 mg/dL    Comment: Performed at [facility], [place] [address] [place], [place] [address]  Body fluid culture w Gram Stain     Status: None (Preliminary result)   Collection Time: 04/25/21 11:53 AM   Specimen: Pleura; Body Fluid  Result Value Ref Range   Specimen Description PLEURAL FLUID    Special Requests      RIGHT Performed at [facility], [place] [address] [place], [place] [address]    Gram Stain PENDING    Culture PENDING    Report Status PENDING   Hemoglobin and hematocrit, blood     Status: Abnormal   Collection Time: 04/25/21 12:03 PM  Result Value Ref Range   Hemoglobin 11.7 (L) 13.0 - 17.0 g/dL   HCT 34.8 (L) 39.0 - 52.0 %    Comment: Performed at [facility] [address], [place], [place] [address]  Lactate dehydrogenase (pleural or peritoneal fluid)     Status: Abnormal   Collection Time: 04/25/21 12:03 PM  Result Value Ref Range   LD, Fluid 114 (H) 3 - 23 U/L    Comment: (NOTE) Results should be evaluated in conjunction with serum values    Fluid Type-FLDH Pleural R     Comment: Performed at [facility] [address], [place], [place] [address]  Glucose, pleural or peritoneal fluid     Status: None   Collection Time: 04/25/21 12:03 PM  Result Value Ref Range   Glucose, Fluid 181 mg/dL     Comment: (NOTE) No normal range established for this test Results should be evaluated in conjunction with serum values    Fluid Type-FGLU PLEURAL R     Comment: Performed at [facility] [address], [place], [place] [address]  Protein, pleural or peritoneal fluid     Status: None   Collection Time: 04/25/21 12:03 PM  Result Value Ref Range   Total protein, fluid <3.0 g/dL    Comment: (NOTE) No normal range established for this test Results should be evaluated in conjunction with serum values    Fluid Type-FTP PLEURAL R     Comment: Performed at [facility] [address], [place], [place] [address]  Body fluid cell count with differential     Status: Abnormal   Collection Time: 04/25/21 12:03 PM  Result Value Ref Range   Fluid Type-FCT PLEURAL R    Color, Fluid RED (A) YELLOW   Appearance, Fluid TURBID (A) CLEAR   Total Nucleated Cell Count, Fluid 1,625 (H) 0 - 1,000 cu mm   Neutrophil Count, Fluid 53 (H) 0 - 25 %   Lymphs, Fluid 35 %   Monocyte-Macrophage-Serous Fluid 12 (L) 50 - 90 %   Eos, Fluid 0 %    Comment: Performed at [facility] [address] [place], [place] [address]  Lactate dehydrogenase     Status: Abnormal   Collection Time: 04/25/21 12:03 PM  Result Value Ref Range   LDH 260 (H) 98 - 192 U/L    Comment: Performed at [facility], [place] [address], [place], [place] [address]  Type and screen [place]     Status: None   Collection Time: 04/25/21 12:04 PM  Result Value Ref Range   ABO/RH(D) A POS    Antibody Screen NEG    Sample Expiration  04/28/2021,2359 Performed at [facility] [address] [place], [place] [address]     CT HEAD WO CONTRAST  Result Date: 04/24/2021 CLINICAL DATA:  87 year old male new onset seizure. Became unresponsive. EXAM: CT HEAD WITHOUT CONTRAST TECHNIQUE: Contiguous axial images were obtained from the base of the skull through the vertex without intravenous  contrast. COMPARISON:  None. FINDINGS: Brain: Cerebral volume is within normal limits for age. No midline shift, ventriculomegaly, mass effect, evidence of mass lesion, intracranial hemorrhage or evidence of cortically based acute infarction. Mild dystrophic calcifications bilateral basal ganglia. Gray-white matter differentiation within normal limits for age. No cortical encephalomalacia identified. Vascular: Calcified atherosclerosis at the skull base. No suspicious intracranial vascular hyperdensity. Skull: No fracture identified. Sinuses/Orbits: Visualized paranasal sinuses and mastoids are well aerated. Other: No discrete orbit or scalp soft tissue injury identified. IMPRESSION: Normal for age non contrast CT appearance of the brain. No acute traumatic injury identified. Electronically Signed   By: Genevie Ann M.D.   On: 04/24/2021 07:39   CT Angio Chest PE W and/or Wo Contrast  Result Date: 04/24/2021 CLINICAL DATA:  Possible pulmonary embolism. Syncope at home when getting up to go to the bathroom. EXAM: CT ANGIOGRAPHY CHEST CT ABDOMEN AND PELVIS WITH CONTRAST TECHNIQUE: Multidetector CT imaging of the chest was performed using the standard protocol during bolus administration of intravenous contrast. Multiplanar CT image reconstructions and MIPs were obtained to evaluate the vascular anatomy. Multidetector CT imaging of the abdomen and pelvis was performed using the standard protocol during bolus administration of intravenous contrast. CONTRAST:  14mL OMNIPAQUE IOHEXOL 350 MG/ML SOLN COMPARISON:  CT abdomen/pelvis 10/08/2008 FINDINGS: CTA CHEST FINDINGS Cardiovascular: Mild-to-moderate cardiomegaly. Prosthetic mitral valve noted. Mild calcified plaque over the left main and right coronary arteries. Ascending thoracic aorta measures approximately 3.6 cm in AP diameter. Calcified plaque over the descending thoracic aorta. Pulmonary arterial system is well opacified and demonstrates no evidence of emboli.  Mediastinum/Nodes: No definite mediastinal or hilar adenopathy. Remaining mediastinal structures are unremarkable. Lungs/Pleura: Moderate moderate to large right pleural fluid collection and small to moderate left pleural fluid collection with associated atelectatic change within the lung bases. No pneumothorax. Airways are unremarkable. Musculoskeletal: Numerous right posterior and lateral rib fractures involving ribs 6 through 11. Review of the MIP images confirms the above findings. CT ABDOMEN and PELVIS FINDINGS Hepatobiliary: Liver and biliary tree are normal. Tiny amount of adjacent pericholecystic fluid as the gallbladder is unremarkable. Pancreas: Normal. Spleen: Normal. Adrenals/Urinary Tract: Adrenal glands are normal. Kidneys are normal in size without focal mass or hydronephrosis. Ureters and bladder are normal. Stomach/Bowel: Stomach and small bowel are normal. Diverticulosis of the colon with mild fecal retention throughout the colon. Appendix not visualized. Vascular/Lymphatic: Mild-to-moderate calcified plaque over the abdominal aorta which is normal in caliber. No adenopathy. Reproductive: Mild prostatic enlargement with moderate pressure upon the bladder base. Other: No significant free peritoneal fluid and no free peritoneal air. Small right inguinal hernia containing peritoneal fat and tiny amount of fluid. Musculoskeletal: Degenerative change of the spine. No acute fracture. Review of the MIP images confirms the above findings. IMPRESSION: 1. No evidence of pulmonary embolism. 2. Moderate to large right pleural fluid collection and small to moderate left pleural fluid collection with associated bibasilar atelectasis. No pneumothorax. Multiple associated mildly displaced and slightly comminuted fractures of posterolateral right ribs 6 through 11. 3. No acute findings in the abdomen/pelvis. 4. Colonic diverticulosis without active inflammation. 5. Mild prostatic enlargement with moderate  impression upon the  bladder base. 6. Small right inguinal hernia containing peritoneal fat and tiny amount of fluid. 7. Aortic atherosclerosis. Atherosclerotic coronary artery disease. Aortic Atherosclerosis (ICD10-I70.0). Electronically Signed   By: Marin Olp M.D.   On: 04/24/2021 09:09   MR BRAIN WO CONTRAST  Result Date: 04/24/2021 CLINICAL DATA:  Stroke follow-up EXAM: MRI HEAD WITHOUT CONTRAST TECHNIQUE: Multiplanar, multiecho pulse sequences of the brain and surrounding structures were obtained without intravenous contrast. COMPARISON:  CT head 04/24/2021 FINDINGS: Brain: No acute infarction, hemorrhage, hydrocephalus, extra-axial collection or mass lesion. Minimal white matter changes. Mild cerebral atrophy. Vascular: Normal arterial flow voids at the skull base. Skull and upper cervical spine: No focal skeletal lesion. Sinuses/Orbits: Paranasal sinuses clear. Bilateral cataract extraction Other: None IMPRESSION: No acute abnormality. Electronically Signed   By: Franchot Gallo M.D.   On: 04/24/2021 17:47   CT ABDOMEN PELVIS W CONTRAST  Result Date: 04/24/2021 CLINICAL DATA:  Possible pulmonary embolism. Syncope at home when getting up to go to the bathroom. EXAM: CT ANGIOGRAPHY CHEST CT ABDOMEN AND PELVIS WITH CONTRAST TECHNIQUE: Multidetector CT imaging of the chest was performed using the standard protocol during bolus administration of intravenous contrast. Multiplanar CT image reconstructions and MIPs were obtained to evaluate the vascular anatomy. Multidetector CT imaging of the abdomen and pelvis was performed using the standard protocol during bolus administration of intravenous contrast. CONTRAST:  171mL OMNIPAQUE IOHEXOL 350 MG/ML SOLN COMPARISON:  CT abdomen/pelvis 10/08/2008 FINDINGS: CTA CHEST FINDINGS Cardiovascular: Mild-to-moderate cardiomegaly. Prosthetic mitral valve noted. Mild calcified plaque over the left main and right coronary arteries. Ascending thoracic aorta measures  approximately 3.6 cm in AP diameter. Calcified plaque over the descending thoracic aorta. Pulmonary arterial system is well opacified and demonstrates no evidence of emboli. Mediastinum/Nodes: No definite mediastinal or hilar adenopathy. Remaining mediastinal structures are unremarkable. Lungs/Pleura: Moderate moderate to large right pleural fluid collection and small to moderate left pleural fluid collection with associated atelectatic change within the lung bases. No pneumothorax. Airways are unremarkable. Musculoskeletal: Numerous right posterior and lateral rib fractures involving ribs 6 through 11. Review of the MIP images confirms the above findings. CT ABDOMEN and PELVIS FINDINGS Hepatobiliary: Liver and biliary tree are normal. Tiny amount of adjacent pericholecystic fluid as the gallbladder is unremarkable. Pancreas: Normal. Spleen: Normal. Adrenals/Urinary Tract: Adrenal glands are normal. Kidneys are normal in size without focal mass or hydronephrosis. Ureters and bladder are normal. Stomach/Bowel: Stomach and small bowel are normal. Diverticulosis of the colon with mild fecal retention throughout the colon. Appendix not visualized. Vascular/Lymphatic: Mild-to-moderate calcified plaque over the abdominal aorta which is normal in caliber. No adenopathy. Reproductive: Mild prostatic enlargement with moderate pressure upon the bladder base. Other: No significant free peritoneal fluid and no free peritoneal air. Small right inguinal hernia containing peritoneal fat and tiny amount of fluid. Musculoskeletal: Degenerative change of the spine. No acute fracture. Review of the MIP images confirms the above findings. IMPRESSION: 1. No evidence of pulmonary embolism. 2. Moderate to large right pleural fluid collection and small to moderate left pleural fluid collection with associated bibasilar atelectasis. No pneumothorax. Multiple associated mildly displaced and slightly comminuted fractures of posterolateral  right ribs 6 through 11. 3. No acute findings in the abdomen/pelvis. 4. Colonic diverticulosis without active inflammation. 5. Mild prostatic enlargement with moderate impression upon the bladder base. 6. Small right inguinal hernia containing peritoneal fat and tiny amount of fluid. 7. Aortic atherosclerosis. Atherosclerotic coronary artery disease. Aortic Atherosclerosis (ICD10-I70.0). Electronically Signed   By: Marin Olp M.D.  On: 04/24/2021 09:09   DG CHEST PORT 1 VIEW  Result Date: 04/25/2021 CLINICAL DATA:  Status post thoracentesis EXAM: PORTABLE CHEST 1 VIEW COMPARISON:  04/24/2021 chest CT angiogram FINDINGS: Stable cardiomediastinal silhouette with mild cardiomegaly. No pneumothorax. Residual small right pleural effusion, substantially decreased. Stable small left pleural effusion. No overt pulmonary edema. Patchy bibasilar lung opacities, decreased on the right and stable on the left. Redemonstration of posterolateral right lower rib fracture. IMPRESSION: 1. No pneumothorax. Residual small right pleural effusion, substantially decreased. 2. Stable small left pleural effusion. 3. Patchy bibasilar lung opacities, decreased on the right and stable on the left, favor atelectasis. 4. Stable cardiomegaly without overt pulmonary edema. Electronically Signed   By: Ilona Sorrel M.D.   On: 04/25/2021 15:24    Review of Systems  Constitutional:  Positive for activity change, appetite change and fatigue. Negative for chills, diaphoresis, fever and unexpected weight change.  HENT: Negative.    Eyes: Negative.   Respiratory:  Positive for shortness of breath.   Cardiovascular:  Positive for leg swelling. Negative for chest pain and palpitations.  Gastrointestinal:  Positive for constipation. Negative for abdominal distention, abdominal pain, anal bleeding, blood in stool, diarrhea, nausea, rectal pain and vomiting.  Genitourinary:  Positive for frequency.       Right inguinal hernia, no tenderness    Musculoskeletal:  Positive for back pain.  Neurological:  Positive for dizziness, syncope, weakness and light-headedness.  Hematological:  Bruises/bleeds easily.  Psychiatric/Behavioral: Negative.    Blood pressure 132/83, pulse 74, temperature 97.9 F (36.6 C), temperature source Rectal, resp. rate (!) 21, height 5\' 9"  (1.753 m), weight 65.8 kg, SpO2 100 %. Physical Exam Constitutional:      General: He is not in acute distress.    Appearance: Normal appearance. He is normal weight. He is not toxic-appearing.  HENT:     Head: Normocephalic and atraumatic.     Mouth/Throat:     Mouth: Mucous membranes are moist.     Pharynx: No oropharyngeal exudate or posterior oropharyngeal erythema.  Eyes:     General: No scleral icterus.    Extraocular Movements: Extraocular movements intact.     Conjunctiva/sclera: Conjunctivae normal.     Pupils: Pupils are equal, round, and reactive to light.  Neck:     Vascular: Carotid bruit present.  Cardiovascular:     Rate and Rhythm: Normal rate and regular rhythm.     Heart sounds: Murmur heard.  Pulmonary:     Effort: Pulmonary effort is normal.     Breath sounds: Normal breath sounds.  Abdominal:     General: Abdomen is flat. There is no distension.     Palpations: Abdomen is soft. There is no mass.     Tenderness: There is no abdominal tenderness.     Hernia: A hernia is present.  Musculoskeletal:     Cervical back: Normal range of motion.  Skin:    General: Skin is warm and dry.     Capillary Refill: Capillary refill takes less than 2 seconds.     Findings: Bruising present.  Neurological:     General: No focal deficit present.     Mental Status: He is alert and oriented to person, place, and time.  Psychiatric:        Mood and Affect: Mood normal.        Behavior: Behavior normal.        Thought Content: Thought content normal.        Judgment: Judgment  normal.    Assessment/Plan: Right rib fractures was hemothorax.  Status  postthoracentesis with a obtained 1400 cc of serous sanguinous fluid.  Post procedure chest x-ray shows good improvement.  He can be continued to be observed over time with serial x-rays but does not appear to require any surgical intervention at this time.  He will be seen by the surgeon.  Adrian Olson 04/25/2021, 3:42 PM    Agree with above.  This is a 87 year old gentleman sustained a hemothorax after sustaining rib fractures.  I personally reviewed his imaging and there appears to be a good result on his postprocedural chest x-ray.  Would plan on repeating cross-sectional imaging to assess if there is any residual fluid that requires drainage.  Would likely recommend chest tube placement if there is a residual fluid collection.  Adrian Olson

## 2021-04-26 ENCOUNTER — Inpatient Hospital Stay (HOSPITAL_COMMUNITY): Payer: Medicare HMO

## 2021-04-26 ENCOUNTER — Telehealth: Payer: Self-pay | Admitting: Pulmonary Disease

## 2021-04-26 DIAGNOSIS — I517 Cardiomegaly: Secondary | ICD-10-CM | POA: Diagnosis not present

## 2021-04-26 DIAGNOSIS — R079 Chest pain, unspecified: Secondary | ICD-10-CM | POA: Diagnosis not present

## 2021-04-26 DIAGNOSIS — I10 Essential (primary) hypertension: Secondary | ICD-10-CM

## 2021-04-26 DIAGNOSIS — R0602 Shortness of breath: Secondary | ICD-10-CM | POA: Diagnosis not present

## 2021-04-26 DIAGNOSIS — J90 Pleural effusion, not elsewhere classified: Secondary | ICD-10-CM | POA: Diagnosis not present

## 2021-04-26 DIAGNOSIS — R4189 Other symptoms and signs involving cognitive functions and awareness: Secondary | ICD-10-CM | POA: Diagnosis not present

## 2021-04-26 DIAGNOSIS — R9431 Abnormal electrocardiogram [ECG] [EKG]: Secondary | ICD-10-CM

## 2021-04-26 DIAGNOSIS — J939 Pneumothorax, unspecified: Secondary | ICD-10-CM | POA: Diagnosis not present

## 2021-04-26 DIAGNOSIS — I5033 Acute on chronic diastolic (congestive) heart failure: Secondary | ICD-10-CM

## 2021-04-26 DIAGNOSIS — I341 Nonrheumatic mitral (valve) prolapse: Secondary | ICD-10-CM

## 2021-04-26 LAB — MRSA NEXT GEN BY PCR, NASAL: MRSA by PCR Next Gen: NOT DETECTED

## 2021-04-26 LAB — CYTOLOGY - NON PAP

## 2021-04-26 LAB — ECHOCARDIOGRAM COMPLETE
Area-P 1/2: 1.78 cm2
Calc EF: 57 %
Height: 69 in
MV M vel: 4.01 m/s
MV Peak grad: 64.3 mmHg
MV VTI: 0.87 cm2
P 1/2 time: 890 msec
Radius: 1.7 cm
S' Lateral: 3.4 cm
Single Plane A2C EF: 49.4 %
Single Plane A4C EF: 61.6 %
Weight: 2246.93 oz

## 2021-04-26 LAB — BASIC METABOLIC PANEL
Anion gap: 9 (ref 5–15)
BUN: 22 mg/dL (ref 8–23)
CO2: 23 mmol/L (ref 22–32)
Calcium: 8.5 mg/dL — ABNORMAL LOW (ref 8.9–10.3)
Chloride: 96 mmol/L — ABNORMAL LOW (ref 98–111)
Creatinine, Ser: 1.25 mg/dL — ABNORMAL HIGH (ref 0.61–1.24)
GFR, Estimated: 55 mL/min — ABNORMAL LOW (ref >60)
Glucose, Bld: 140 mg/dL — ABNORMAL HIGH (ref 70–99)
Potassium: 4.2 mmol/L (ref 3.5–5.1)
Sodium: 128 mmol/L — ABNORMAL LOW (ref 135–145)

## 2021-04-26 LAB — HEMOGLOBIN AND HEMATOCRIT, BLOOD
HCT: 32 % — ABNORMAL LOW (ref 39.0–52.0)
HCT: 32.7 % — ABNORMAL LOW (ref 39.0–52.0)
HCT: 33.2 % — ABNORMAL LOW (ref 39.0–52.0)
Hemoglobin: 10.9 g/dL — ABNORMAL LOW (ref 13.0–17.0)
Hemoglobin: 11.2 g/dL — ABNORMAL LOW (ref 13.0–17.0)
Hemoglobin: 11.2 g/dL — ABNORMAL LOW (ref 13.0–17.0)

## 2021-04-26 LAB — GLUCOSE, CAPILLARY: Glucose-Capillary: 169 mg/dL — ABNORMAL HIGH (ref 70–99)

## 2021-04-26 LAB — LACTIC ACID, PLASMA: Lactic Acid, Venous: 1.2 mmol/L (ref 0.5–1.9)

## 2021-04-26 MED ORDER — LEVOTHYROXINE SODIUM 75 MCG PO TABS
75.0000 ug | ORAL_TABLET | Freq: Every day | ORAL | Status: DC
  Administered 2021-04-26: 75 ug via ORAL
  Filled 2021-04-26: qty 1

## 2021-04-26 MED ORDER — INSULIN ASPART 100 UNIT/ML IJ SOLN: 0.0000 [IU] | Freq: Three times a day (TID) | INTRAMUSCULAR | Status: DC

## 2021-04-26 MED ORDER — METOPROLOL TARTRATE 25 MG PO TABS: 12.5000 mg | ORAL_TABLET | Freq: Two times a day (BID) | ORAL | 0 refills | Status: DC

## 2021-04-26 MED ORDER — LEVOTHYROXINE SODIUM 75 MCG PO TABS: 75.0000 ug | ORAL_TABLET | Freq: Every day | ORAL | Status: DC

## 2021-04-26 NOTE — Progress Notes (Signed)
Mobility Specialist Progress Note    04/26/21 1359  Mobility  Activity Ambulated in hall  Level of Assistance Contact guard assist, steadying assist  Assistive Device Front wheel walker  Distance Ambulated (ft) 270 ft  Mobility Ambulated with assistance in hallway  Mobility Response Tolerated well  Mobility performed by Mobility specialist  $Mobility charge 1 Mobility   Pt received in bed and agreeable. Attempted BM but was unsuccessful. No complaints on walk. Returned to room with ECHO present.   Pt expressed interest in rolling walker for home and CM notified.   [facility] Mobility Specialist  M.S. Primary Phone: [phone] M.S. Secondary Phone: [phone]

## 2021-04-26 NOTE — Consult Note (Signed)
NAME:  Adrian Olson, MRN:  [id number], DOB:  1931-05-11, LOS: 2 ADMISSION DATE:  04/24/2021, CONSULTATION DATE:  04/26/21 REFERRING MD:  Cruzita Lederer, CHIEF COMPLAINT:  Fall, dyspnea   History of Present Illness:  Adrian Olson is a 87 y.o. M with PMH of MVP and MR with mitral valve clop, T2DM, multiple falls, HL, HTN, carotid artery disease who presented on 12/11 after he had an episode of unresponsiveness and fall at home.  He has recently been noted to be orthostatic and cardiology had discontinued HCTZ.   He was alert in the ED and trauma work-up notable for multiple mildly displaced and slightly communicated fractures at the posterior lateral right ribs 6 through 11 with bilateral pleural effusions moderate to large on the R.  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   Significant Hospital Events: Including procedures, antibiotic start and stop dates in addition to other pertinent events   12/11 Admitted to [facility] after fall with rib fx's and pleural effusion 12/12 Thoracentesis, PCCM consult  Interim History / Subjective:   No acute events overnight. Patient breathing well today. On room air. Ambulated by PT with SpO2 dip to 92% on room air.   Objective   Blood pressure 136/61, pulse 60, temperature 98 F (36.7 C), temperature source Oral, resp. rate 17, height 5\' 9"  (1.753 m), weight 63.7 kg, SpO2 100 %.        Intake/Output Summary (Last 24 hours) at 04/26/2021 1345 Last data filed at 04/26/2021 1321 Gross per 24 hour  Intake 360 ml  Output 200 ml  Net 160 ml    Filed Weights   04/24/21 1246 04/25/21 2000 04/26/21 0400   Weight: 65.8 kg 63.7 kg 63.7 kg    General:  thin, elderly male, no acute distress, in bed resting HEENT: MM pink/moist, sclera anicteric  Neuro: awake and alert, no focal deficits, speech clear CV: s1s2 rrr, no m/r/g PULM:  bibasilar rales. No wheezing. GI: soft, bsx4 active  Extremities: warm/dry, no edema  Skin: no rashes or lesions   Resolved Hospital Problem list     Assessment & Plan:   Acute Hypoxic respiratory failure likely secondary to hemothorax in the setting of traumatic R fib fractures  -s/p thoracentesis with approx 1400cc bloody fluid removed - Transudate based on LDH and total protein ratios. Neutrophil predominant. Negative gram stain. Cytology pending.  - Chest x-ray with no re-accumulation on the right s/p thora, small effusion on left.  - He has been weaned of supplemental O2 and ambulatory O2 sats are normal.  - Will follow up in clinic in 1 week for follow up chest radiograph.   PCCM will sign off.   Best Practice (right click and "Reselect all SmartList Selections" daily)   Per primary  Labs   CBC: Recent Labs  Lab 04/24/21 0555 04/24/21 0747 04/25/21 0327 04/25/21 1203 04/25/21 1918 04/26/21 0016 04/26/21 0425 04/26/21 0752  WBC 8.2  --  8.0  --   --   --   --   --   NEUTROABS 5.9  --   --   --   --   --   --   --   HGB 11.6*   < > 11.7*  11.7* 11.0* 10.9* 11.2* 11.2*  HCT 34.8*   < > 34.0* 34.8* 32.5* 32.0* 32.7* 33.2*  MCV 97.2  --  95.0  --   --   --   --   --   PLT 247  --  251  --   --   --   --   --    < > = values in this interval not displayed.     Basic Metabolic Panel: Recent Labs  Lab 04/24/21 0555 04/24/21 0747 04/24/21 0748 04/24/21 2341 04/25/21 0327 04/25/21 0339 04/26/21 0425  NA 123* 127* 126* 126* 126*  --  128*  K 4.5 4.5 4.5 4.0 3.8  --  4.2  CL 92*  --  94* 94* 94*  --  96*  CO2 20*  --   --  22 20*  --  23  GLUCOSE 178*  --  140* 181* 145*  --  140*  BUN 21  --  22 23 21   --  22  CREATININE 1.33*  --   1.20 1.22 1.20  --  1.25*  CALCIUM 9.1  --   --  9.2 9.0  --  8.5*  MG  --   --   --   --   --  1.8  --     GFR: Estimated Creatinine Clearance: 36.8 mL/min (A) (by C-G formula based on SCr of 1.25 mg/dL (H)). Recent Labs  Lab 04/24/21 0555 04/24/21 0556 04/24/21 0849 04/25/21 0310 04/25/21 0327 04/25/21 1918 04/26/21 0016  WBC 8.2  --   --   --  8.0  --   --   LATICACIDVEN  --    < > 1.7 1.3  --  2.5* 1.2   < > = values in this interval not displayed.     Liver Function Tests: Recent Labs  Lab 04/24/21 0555  AST 114*  ALT 110*  ALKPHOS 116  BILITOT 0.7  PROT 6.5  ALBUMIN 3.5    No results for input(s): LIPASE, AMYLASE in the last 168 hours. No results for input(s): AMMONIA in the last 168 hours.  ABG    Component Value Date/Time   PHART 7.412 10/07/2019 1115   PCO2ART 38.3 10/07/2019 1115   PO2ART 116 (H) 10/07/2019 1115   HCO3 23.3 04/24/2021 0747   TCO2 22 04/24/2021 0748   ACIDBASEDEF 1.0 04/24/2021 0747   O2SAT 36.0 04/24/2021 0747      Coagulation Profile: No results for input(s): INR, PROTIME in the last 168 hours.  Cardiac Enzymes: No results for input(s): CKTOTAL, CKMB, CKMBINDEX, TROPONINI in the last 168 hours.  HbA1C: Hgb A1c MFr Bld  Date/Time Value Ref Range Status  10/07/2019 10:32 AM 6.5 (H) 4.8 - 5.6 % Final    Comment:    (NOTE) Pre diabetes:          5.7%-6.4% Diabetes:              >6.4% Glycemic control for   <7.0% adults with diabetes     CBG: Recent Labs  Lab 04/24/21 0630 04/26/21 [place], [place] [facility] Office: [phone]   See Amion for personal pager PCCM on call pager [phone] until 7pm. Please call Elink 7p-7a. [phone]

## 2021-04-26 NOTE — Progress Notes (Signed)
EEG completed, results pending. 

## 2021-04-26 NOTE — Progress Notes (Signed)
Progress Note  Patient Name: Adrian Olson Date of Encounter: 04/26/2021  [facility] Cardiologist: [name], MD   Subjective   Requesting discharge from hospital.  No chest pain or obvious shortness of breath.  Feels he will be able to recover better at home.  Inpatient Medications    Scheduled Meds:  aspirin EC  81 mg Oral Daily   enoxaparin (LOVENOX) injection  40 mg Subcutaneous Q24H   finasteride  5 mg Oral Q1200   insulin aspart  0-9 Units Subcutaneous TID WC   metoprolol tartrate  12.5 mg Oral BID   pravastatin  20 mg Oral QHS   psyllium  1 packet Oral Daily   senna-docusate  1 tablet Oral QHS   sodium chloride flush  3 mL Intravenous Q12H   tamsulosin  0.4 mg Oral Q1200   Continuous Infusions:  PRN Meds: acetaminophen **OR** acetaminophen, albuterol, HYDROcodone-acetaminophen, ondansetron **OR** ondansetron (ZOFRAN) IV, sorbitol, milk of mag, mineral oil, glycerin (SMOG) enema, traMADol   Vital Signs    Vitals:   04/25/21 2000 04/25/21 2003 04/26/21 0011 04/26/21 0400  BP: (!) 143/73  (!) 120/54 123/74  Pulse: 69 65 (!) 50 (!) 57  Resp: 20 20 16 15   Temp: 98.2 F (36.8 C)  98 F (36.7 C) 98.1 F (36.7 C)  TempSrc: Oral  Oral Oral  SpO2: 95% 95% 97% 95%  Weight: 63.7 kg   63.7 kg  Height: 5\' 9"  (1.753 m)       Intake/Output Summary (Last 24 hours) at 04/26/2021 0913 Last data filed at 04/26/2021 0708 Gross per 24 hour  Intake --  Output 200 ml  Net -200 ml   Last 3 Weights 04/26/2021 04/25/2021 04/24/2021  Weight (lbs) 140 lb 6.9 oz 140 lb 6.9 oz 145 lb  Weight (kg) 63.7 kg 63.7 kg 65.772 kg      Telemetry    Maintaining sinus rhythm without excessive bradycardia or tachycardia- Personally Reviewed  ECG    No new tracing- Personally Reviewed  Physical Exam  Elderly and frail GEN: No acute distress.   Neck: No JVD Cardiac: Regular rate and rhythm with 3/6 apical to left axillary holosystolic mitral regurgitation murmur,  rubs, or gallops.  Respiratory: Clear to auscultation bilaterally. GI: Soft, nontender, non-distended  MS: No edema; No deformity. Neuro:  Nonfocal  Psych: Normal affect   Labs    High Sensitivity Troponin:   Recent Labs  Lab 04/24/21 0555 04/24/21 0840  TROPONINIHS 79* 135*     Chemistry Recent Labs  Lab 04/24/21 0555 04/24/21 0747 04/24/21 2341 04/25/21 0327 04/25/21 0339 04/26/21 0425  NA 123*   < > 126* 126*  --  128*  K 4.5   < > 4.0 3.8  --  4.2  CL 92*   < > 94* 94*  --  96*  CO2 20*  --  22 20*  --  23  GLUCOSE 178*   < > 181* 145*  --  140*  BUN 21   < > 23 21  --  22  CREATININE 1.33*   < > 1.22 1.20  --  1.25*  CALCIUM 9.1  --  9.2 9.0  --  8.5*  MG  --   --   --   --  1.8  --   PROT 6.5  --   --   --   --   --   ALBUMIN 3.5  --   --   --   --   --  AST 114*  --   --   --   --   --   ALT 110*  --   --   --   --   --   ALKPHOS 116  --   --   --   --   --   BILITOT 0.7  --   --   --   --   --   GFRNONAA 51*  --  57* 58*  --  55*  ANIONGAP 11  --  10 12  --  9   < > = values in this interval not displayed.    Lipids No results for input(s): CHOL, TRIG, HDL, LABVLDL, LDLCALC, CHOLHDL in the last 168 hours.  Hematology Recent Labs  Lab 04/24/21 0555 04/24/21 0747 04/25/21 0327 04/25/21 1203 04/26/21 0016 04/26/21 0425 04/26/21 0752  WBC 8.2  --  8.0  --   --   --   --   RBC 3.58*  --  3.58*  --   --   --   --   HGB 11.6*   < > 11.7*   < > 10.9* 11.2* 11.2*  HCT 34.8*   < > 34.0*   < > 32.0* 32.7* 33.2*  MCV 97.2  --  95.0  --   --   --   --   MCH 32.4  --  32.7  --   --   --   --   MCHC 33.3  --  34.4  --   --   --   --   RDW 13.2  --  13.5  --   --   --   --   PLT 247  --  251  --   --   --   --    < > = values in this interval not displayed.   Thyroid  Recent Labs  Lab 04/25/21 0339  TSH 9.172*    BNPNo results for input(s): BNP, PROBNP in the last 168 hours.  DDimer No results for input(s): DDIMER in the last 168 hours.   Radiology     MR BRAIN WO CONTRAST  Result Date: 04/24/2021 CLINICAL DATA:  Stroke follow-up EXAM: MRI HEAD WITHOUT CONTRAST TECHNIQUE: Multiplanar, multiecho pulse sequences of the brain and surrounding structures were obtained without intravenous contrast. COMPARISON:  CT head 04/24/2021 FINDINGS: Brain: No acute infarction, hemorrhage, hydrocephalus, extra-axial collection or mass lesion. Minimal white matter changes. Mild cerebral atrophy. Vascular: Normal arterial flow voids at the skull base. Skull and upper cervical spine: No focal skeletal lesion. Sinuses/Orbits: Paranasal sinuses clear. Bilateral cataract extraction Other: None IMPRESSION: No acute abnormality. Electronically Signed   By: Franchot Gallo M.D.   On: 04/24/2021 17:47   DG CHEST PORT 1 VIEW  Result Date: 04/25/2021 CLINICAL DATA:  Status post thoracentesis EXAM: PORTABLE CHEST 1 VIEW COMPARISON:  04/24/2021 chest CT angiogram FINDINGS: Stable cardiomediastinal silhouette with mild cardiomegaly. No pneumothorax. Residual small right pleural effusion, substantially decreased. Stable small left pleural effusion. No overt pulmonary edema. Patchy bibasilar lung opacities, decreased on the right and stable on the left. Redemonstration of posterolateral right lower rib fracture. IMPRESSION: 1. No pneumothorax. Residual small right pleural effusion, substantially decreased. 2. Stable small left pleural effusion. 3. Patchy bibasilar lung opacities, decreased on the right and stable on the left, favor atelectasis. 4. Stable cardiomegaly without overt pulmonary edema. Electronically Signed   By: Ilona Sorrel M.D.   On: 04/25/2021 15:24    Cardiac Studies  No new data  Patient Profile     87 y.o. male with a hx of HTN, HLD, 2DM, anxiety/depression, MVP and MR s/p TEER 10/09/19 who is being seen 04/25/2021 for the evaluation of SVT/and syncope  Assessment & Plan    Junctional tachycardia, being suppressed with low-dose beta-blocker  therapy. Persistent significant mitral regurgitation despite TEER 2021 ?  Near syncope: Does not appear to be related to severe arrhythmia or excessive bradycardia. Hyponatremia: Could be aggravating overall current clinical syndrome. [facility] will sign off.   Medication Recommendations: Continue beta-blocker Other recommendations (labs, testing, etc): None Follow up as an outpatient: As previously scheduled  For questions or updates, please contact [place] Please consult [email or web address] for contact info under        Signed, [name], MD  04/26/2021, 9:13 AM

## 2021-04-26 NOTE — Telephone Encounter (Signed)
Please schedule patient for hospital follow up with me on 12/20 at 3:30pm with a chest x-ray for pleural effusion.   Thanks, JD

## 2021-04-26 NOTE — Progress Notes (Signed)
°  Echocardiogram 2D Echocardiogram has been performed.  Bobbye Charleston 04/26/2021, 2:36 PM

## 2021-04-26 NOTE — Procedures (Signed)
Patient Name: Adrian Olson  MRN: [id number]  Epilepsy Attending: Lora Havens  Referring Physician/Provider: Dr Marzetta Board Date: 04/26/2021 Duration: 23.23 mins  Patient history: 87 year old male with an episode of unresponsiveness/syncope.  EEG evaluate for seizures.  Level of alertness: Awake, drowsy  AEDs during EEG study: None  Technical aspects: This EEG study was done with scalp electrodes positioned according to the 10-20 International system of electrode placement. Electrical activity was acquired at a sampling rate of 500Hz  and reviewed with a high frequency filter of 70Hz  and a low frequency filter of 1Hz . EEG data were recorded continuously and digitally stored.   Description: The posterior dominant rhythm consists of 8-9 Hz activity of moderate voltage (25-35 uV) seen predominantly in posterior head regions, symmetric and reactive to eye opening and eye closing. Drowsiness was characterized by attenuation of the posterior background rhythm. Hyperventilation and photic stimulation were not performed.     IMPRESSION: This study is within normal limits. No seizures or epileptiform discharges were seen throughout the recording.  Ko Bardon Barbra Sarks

## 2021-04-26 NOTE — Telephone Encounter (Signed)
Patient has been scheduled

## 2021-04-26 NOTE — Plan of Care (Signed)
Problem: Acute Rehab PT Goals(only PT should resolve) Goal: Pt Will Go Supine/Side To Sit Outcome: Adequate for Discharge Goal: Patient Will Transfer Sit To/From Stand Outcome: Adequate for Discharge Goal: Pt Will Ambulate Outcome: Adequate for Discharge Goal: Pt Will Go Up/Down Stairs Outcome: Adequate for Discharge   Problem: Acute Rehab OT Goals (only OT should resolve) Goal: Pt. Will Perform Grooming Outcome: Adequate for Discharge Goal: Pt. Will Perform Lower Body Bathing Outcome: Adequate for Discharge Goal: Pt. Will Perform Lower Body Dressing Outcome: Adequate for Discharge Goal: Pt. Will Transfer To Toilet Outcome: Adequate for Discharge   Problem: Education: Goal: Knowledge of General Education information will improve Description: Including pain rating scale, medication(s)/side effects and non-pharmacologic comfort measures 04/26/2021 1517 by Thressa Sheller, RN Outcome: Adequate for Discharge 04/26/2021 1516 by Thressa Sheller, RN Outcome: Adequate for Discharge   Problem: Health Behavior/Discharge Planning: Goal: Ability to manage health-related needs will improve 04/26/2021 1517 by Thressa Sheller, RN Outcome: Adequate for Discharge 04/26/2021 1516 by Thressa Sheller, RN Outcome: Adequate for Discharge   Problem: Clinical Measurements: Goal: Ability to maintain clinical measurements within normal limits will improve 04/26/2021 1517 by Thressa Sheller, RN Outcome: Adequate for Discharge 04/26/2021 1516 by Thressa Sheller, RN Outcome: Adequate for Discharge Goal: Will remain free from infection 04/26/2021 1517 by Thressa Sheller, RN Outcome: Adequate for Discharge 04/26/2021 1516 by Thressa Sheller, RN Outcome: Adequate for Discharge Goal: Diagnostic test results will improve 04/26/2021 1517 by Thressa Sheller, RN Outcome: Adequate for Discharge 04/26/2021 1516 by Thressa Sheller, RN Outcome: Adequate for Discharge Goal:  Respiratory complications will improve 04/26/2021 1517 by Thressa Sheller, RN Outcome: Adequate for Discharge 04/26/2021 1516 by Thressa Sheller, RN Outcome: Adequate for Discharge Goal: Cardiovascular complication will be avoided 04/26/2021 1517 by Thressa Sheller, RN Outcome: Adequate for Discharge 04/26/2021 1516 by Thressa Sheller, RN Outcome: Adequate for Discharge   Problem: Activity: Goal: Risk for activity intolerance will decrease 04/26/2021 1517 by Thressa Sheller, RN Outcome: Adequate for Discharge 04/26/2021 1516 by Thressa Sheller, RN Outcome: Adequate for Discharge   Problem: Nutrition: Goal: Adequate nutrition will be maintained 04/26/2021 1517 by Thressa Sheller, RN Outcome: Adequate for Discharge 04/26/2021 1516 by Thressa Sheller, RN Outcome: Adequate for Discharge   Problem: Coping: Goal: Level of anxiety will decrease 04/26/2021 1517 by Thressa Sheller, RN Outcome: Adequate for Discharge 04/26/2021 1516 by Thressa Sheller, RN Outcome: Adequate for Discharge   Problem: Elimination: Goal: Will not experience complications related to bowel motility 04/26/2021 1517 by Thressa Sheller, RN Outcome: Adequate for Discharge 04/26/2021 1516 by Thressa Sheller, RN Outcome: Adequate for Discharge Goal: Will not experience complications related to urinary retention 04/26/2021 1517 by Thressa Sheller, RN Outcome: Adequate for Discharge 04/26/2021 1516 by Thressa Sheller, RN Outcome: Adequate for Discharge   Problem: Pain Managment: Goal: General experience of comfort will improve 04/26/2021 1517 by Thressa Sheller, RN Outcome: Adequate for Discharge 04/26/2021 1516 by Thressa Sheller, RN Outcome: Adequate for Discharge   Problem: Safety: Goal: Ability to remain free from injury will improve 04/26/2021 1517 by Thressa Sheller, RN Outcome: Adequate for Discharge 04/26/2021 1516 by Thressa Sheller,  RN Outcome: Adequate for Discharge   Problem: Skin Integrity: Goal: Risk for impaired skin integrity will decrease 04/26/2021 1517 by Thressa Sheller, RN Outcome: Adequate for Discharge 04/26/2021 1516 by Thressa Sheller, RN Outcome: Adequate for Discharge

## 2021-04-26 NOTE — Progress Notes (Signed)
2 D echo attempted, but EEG in room. Will try later

## 2021-04-26 NOTE — Progress Notes (Signed)
Patient ID: Adrian Olson, male   DOB: Jun 20, 1930, 87 y.o.   MRN: [id number] [facility] Surgery Progress Note     Subjective: CC-  Up in chair. No complaints. Denies any pain. Not taking any pain medication. Denies SOB. Currently O2 sats lower 90s on room air. Pulling 1100 on IS.   Objective: Vital signs in last 24 hours: Temp:  [98 F (36.7 C)-98.2 F (36.8 C)] 98.1 F (36.7 C) (12/13 0400) Pulse Rate:  [50-74] 57 (12/13 0400) Resp:  [13-23] 15 (12/13 0400) BP: (111-158)/(54-107) 123/74 (12/13 0400) SpO2:  [94 %-100 %] 95 % (12/13 0400) Weight:  [63.7 kg] 63.7 kg (12/13 0400) Last BM Date: 04/24/21  Intake/Output from previous day: No intake/output data recorded. Intake/Output this shift: Total I/O In: -  Out: 200 [Urine:200]  PE: Gen:  Alert, NAD, pleasant HEENT: EOM's intact, pupils equal and round Card:  RRR Pulm:  CTAB, no W/R/R, rate and effort normal on room air Abd: Soft, NT/ND Psych: A&Ox3 Skin: no rashes noted, warm and dry  Lab Results:  Recent Labs    04/24/21 0555 04/24/21 0747 04/25/21 0327 04/25/21 1203 04/26/21 0016 04/26/21 0425  WBC 8.2  --  8.0  --   --   --   HGB 11.6*   < > 11.7*   < > 10.9* 11.2*  HCT 34.8*   < > 34.0*   < > 32.0* 32.7*  PLT 247  --  251  --   --   --    < > = values in this interval not displayed.   BMET Recent Labs    04/24/21 2341 04/25/21 0327  NA 126* 126*  K 4.0 3.8  CL 94* 94*  CO2 22 20*  GLUCOSE 181* 145*  BUN 23 21  CREATININE 1.22 1.20  CALCIUM 9.2 9.0   PT/INR No results for input(s): LABPROT, INR in the last 72 hours. CMP     Component Value Date/Time   NA 126 (L) 04/25/2021 0327   NA 126 (L) 04/11/2021 1421   K 3.8 04/25/2021 0327   CL 94 (L) 04/25/2021 0327   CO2 20 (L) 04/25/2021 0327   GLUCOSE 145 (H) 04/25/2021 0327   BUN 21 04/25/2021 0327   BUN 21 04/11/2021 1421   CREATININE 1.20 04/25/2021 0327   CALCIUM 9.0 04/25/2021 0327   PROT 6.5 04/24/2021 0555   ALBUMIN 3.5  04/24/2021 0555   AST 114 (H) 04/24/2021 0555   ALT 110 (H) 04/24/2021 0555   ALKPHOS 116 04/24/2021 0555   BILITOT 0.7 04/24/2021 0555   GFRNONAA 58 (L) 04/25/2021 0327   GFRAA >60 10/10/2019 0203   Lipase  No results found for: LIPASE     Studies/Results: CT Angio Chest PE W and/or Wo Contrast  Result Date: 04/24/2021 CLINICAL DATA:  Possible pulmonary embolism. Syncope at home when getting up to go to the bathroom. EXAM: CT ANGIOGRAPHY CHEST CT ABDOMEN AND PELVIS WITH CONTRAST TECHNIQUE: Multidetector CT imaging of the chest was performed using the standard protocol during bolus administration of intravenous contrast. Multiplanar CT image reconstructions and MIPs were obtained to evaluate the vascular anatomy. Multidetector CT imaging of the abdomen and pelvis was performed using the standard protocol during bolus administration of intravenous contrast. CONTRAST:  158mL OMNIPAQUE IOHEXOL 350 MG/ML SOLN COMPARISON:  CT abdomen/pelvis 10/08/2008 FINDINGS: CTA CHEST FINDINGS Cardiovascular: Mild-to-moderate cardiomegaly. Prosthetic mitral valve noted. Mild calcified plaque over the left main and right coronary arteries. Ascending thoracic aorta measures approximately 3.6  cm in AP diameter. Calcified plaque over the descending thoracic aorta. Pulmonary arterial system is well opacified and demonstrates no evidence of emboli. Mediastinum/Nodes: No definite mediastinal or hilar adenopathy. Remaining mediastinal structures are unremarkable. Lungs/Pleura: Moderate moderate to large right pleural fluid collection and small to moderate left pleural fluid collection with associated atelectatic change within the lung bases. No pneumothorax. Airways are unremarkable. Musculoskeletal: Numerous right posterior and lateral rib fractures involving ribs 6 through 11. Review of the MIP images confirms the above findings. CT ABDOMEN and PELVIS FINDINGS Hepatobiliary: Liver and biliary tree are normal. Tiny amount  of adjacent pericholecystic fluid as the gallbladder is unremarkable. Pancreas: Normal. Spleen: Normal. Adrenals/Urinary Tract: Adrenal glands are normal. Kidneys are normal in size without focal mass or hydronephrosis. Ureters and bladder are normal. Stomach/Bowel: Stomach and small bowel are normal. Diverticulosis of the colon with mild fecal retention throughout the colon. Appendix not visualized. Vascular/Lymphatic: Mild-to-moderate calcified plaque over the abdominal aorta which is normal in caliber. No adenopathy. Reproductive: Mild prostatic enlargement with moderate pressure upon the bladder base. Other: No significant free peritoneal fluid and no free peritoneal air. Small right inguinal hernia containing peritoneal fat and tiny amount of fluid. Musculoskeletal: Degenerative change of the spine. No acute fracture. Review of the MIP images confirms the above findings. IMPRESSION: 1. No evidence of pulmonary embolism. 2. Moderate to large right pleural fluid collection and small to moderate left pleural fluid collection with associated bibasilar atelectasis. No pneumothorax. Multiple associated mildly displaced and slightly comminuted fractures of posterolateral right ribs 6 through 11. 3. No acute findings in the abdomen/pelvis. 4. Colonic diverticulosis without active inflammation. 5. Mild prostatic enlargement with moderate impression upon the bladder base. 6. Small right inguinal hernia containing peritoneal fat and tiny amount of fluid. 7. Aortic atherosclerosis. Atherosclerotic coronary artery disease. Aortic Atherosclerosis (ICD10-I70.0). Electronically Signed   By: Marin Olp M.D.   On: 04/24/2021 09:09   MR BRAIN WO CONTRAST  Result Date: 04/24/2021 CLINICAL DATA:  Stroke follow-up EXAM: MRI HEAD WITHOUT CONTRAST TECHNIQUE: Multiplanar, multiecho pulse sequences of the brain and surrounding structures were obtained without intravenous contrast. COMPARISON:  CT head 04/24/2021 FINDINGS: Brain:  No acute infarction, hemorrhage, hydrocephalus, extra-axial collection or mass lesion. Minimal white matter changes. Mild cerebral atrophy. Vascular: Normal arterial flow voids at the skull base. Skull and upper cervical spine: No focal skeletal lesion. Sinuses/Orbits: Paranasal sinuses clear. Bilateral cataract extraction Other: None IMPRESSION: No acute abnormality. Electronically Signed   By: Franchot Gallo M.D.   On: 04/24/2021 17:47   CT ABDOMEN PELVIS W CONTRAST  Result Date: 04/24/2021 CLINICAL DATA:  Possible pulmonary embolism. Syncope at home when getting up to go to the bathroom. EXAM: CT ANGIOGRAPHY CHEST CT ABDOMEN AND PELVIS WITH CONTRAST TECHNIQUE: Multidetector CT imaging of the chest was performed using the standard protocol during bolus administration of intravenous contrast. Multiplanar CT image reconstructions and MIPs were obtained to evaluate the vascular anatomy. Multidetector CT imaging of the abdomen and pelvis was performed using the standard protocol during bolus administration of intravenous contrast. CONTRAST:  116mL OMNIPAQUE IOHEXOL 350 MG/ML SOLN COMPARISON:  CT abdomen/pelvis 10/08/2008 FINDINGS: CTA CHEST FINDINGS Cardiovascular: Mild-to-moderate cardiomegaly. Prosthetic mitral valve noted. Mild calcified plaque over the left main and right coronary arteries. Ascending thoracic aorta measures approximately 3.6 cm in AP diameter. Calcified plaque over the descending thoracic aorta. Pulmonary arterial system is well opacified and demonstrates no evidence of emboli. Mediastinum/Nodes: No definite mediastinal or hilar adenopathy. Remaining mediastinal structures are  unremarkable. Lungs/Pleura: Moderate moderate to large right pleural fluid collection and small to moderate left pleural fluid collection with associated atelectatic change within the lung bases. No pneumothorax. Airways are unremarkable. Musculoskeletal: Numerous right posterior and lateral rib fractures involving ribs  6 through 11. Review of the MIP images confirms the above findings. CT ABDOMEN and PELVIS FINDINGS Hepatobiliary: Liver and biliary tree are normal. Tiny amount of adjacent pericholecystic fluid as the gallbladder is unremarkable. Pancreas: Normal. Spleen: Normal. Adrenals/Urinary Tract: Adrenal glands are normal. Kidneys are normal in size without focal mass or hydronephrosis. Ureters and bladder are normal. Stomach/Bowel: Stomach and small bowel are normal. Diverticulosis of the colon with mild fecal retention throughout the colon. Appendix not visualized. Vascular/Lymphatic: Mild-to-moderate calcified plaque over the abdominal aorta which is normal in caliber. No adenopathy. Reproductive: Mild prostatic enlargement with moderate pressure upon the bladder base. Other: No significant free peritoneal fluid and no free peritoneal air. Small right inguinal hernia containing peritoneal fat and tiny amount of fluid. Musculoskeletal: Degenerative change of the spine. No acute fracture. Review of the MIP images confirms the above findings. IMPRESSION: 1. No evidence of pulmonary embolism. 2. Moderate to large right pleural fluid collection and small to moderate left pleural fluid collection with associated bibasilar atelectasis. No pneumothorax. Multiple associated mildly displaced and slightly comminuted fractures of posterolateral right ribs 6 through 11. 3. No acute findings in the abdomen/pelvis. 4. Colonic diverticulosis without active inflammation. 5. Mild prostatic enlargement with moderate impression upon the bladder base. 6. Small right inguinal hernia containing peritoneal fat and tiny amount of fluid. 7. Aortic atherosclerosis. Atherosclerotic coronary artery disease. Aortic Atherosclerosis (ICD10-I70.0). Electronically Signed   By: Marin Olp M.D.   On: 04/24/2021 09:09   DG CHEST PORT 1 VIEW  Result Date: 04/25/2021 CLINICAL DATA:  Status post thoracentesis EXAM: PORTABLE CHEST 1 VIEW COMPARISON:   04/24/2021 chest CT angiogram FINDINGS: Stable cardiomediastinal silhouette with mild cardiomegaly. No pneumothorax. Residual small right pleural effusion, substantially decreased. Stable small left pleural effusion. No overt pulmonary edema. Patchy bibasilar lung opacities, decreased on the right and stable on the left. Redemonstration of posterolateral right lower rib fracture. IMPRESSION: 1. No pneumothorax. Residual small right pleural effusion, substantially decreased. 2. Stable small left pleural effusion. 3. Patchy bibasilar lung opacities, decreased on the right and stable on the left, favor atelectasis. 4. Stable cardiomegaly without overt pulmonary edema. Electronically Signed   By: Ilona Sorrel M.D.   On: 04/25/2021 15:24    Anti-infectives: Anti-infectives (From admission, onward)    None        Assessment/Plan GLF several days PTA   R 6-11 rib fx - pulmonary toilet, incentive spirometry, pain control (not taking any pain medication, has tylenol ordered PRN) Moderate to large R pleural effusion - s/p thoracentesis 12/12 by CCM with 1400cc bloody fluid drained, TCTS also following and does not recommend any surgery at this time Small to moderate left pleural effusion - monitor   FEN: Heart healthy ID: None currently VTE: Lovenox   Dispo: We will sign off, please call with questions or concerns.     Per primary: Hyponatremia Renal insufficiency Hypertension Orthostatic hypotension CHF  Transaminitis Hypothyroidism Right inguinal hernia Hyperlipidemia   LOS: 2 days    Wellington Hampshire, [facility] Surgery 04/26/2021, 8:00 AM Please see Amion for pager number during day hours 7:00am-4:30pm

## 2021-04-26 NOTE — Discharge Summary (Signed)
Physician Discharge Summary  Adrian Olson:[id number] DOB: 06-13-1930 DOA: 04/24/2021  PCP: Merrilee Seashore, MD  Admit date: 04/24/2021 Discharge date: 04/26/2021  Admitted From: home Disposition:  home  Recommendations for Outpatient Follow-up:  Follow up with Pulmonary in a week Please obtain BMP/CBC in one week Follow up with PCP in 3-4 weeks for TSH recheck   Home Health: PT, OT Equipment/Devices: none  Discharge Condition: stable CODE STATUS: Full code Diet recommendation: regular  HPI: Per admitting MD, Adrian Olson is a 87 y.o. 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  History is obtained from the patient, wife, and daughter who are present at bedside.  Apparently patient has had a steady decline since having a fall during Thanksgiving last month where he bruised the right side of his ribs.  That following Monday on 11/28, he was evaluated by his PCP and subsequently by cardiology who recommended patient discontinue hydrochlorothiazide and possibly Flomax after orthostatic vital signs were noted to be positive.  Family reports chest x-ray at that time did note a small pleural effusion.  Despite patient stopping hydrochlorothiazide he still reports being off balance with getting up and moving around.  Denies that the room spins around him, but he does feel unsteady on his feet.  He has had no subsequent falls, but needs assistance with getting around now and prior could walk a couple miles per day.  This morning the patient had tried to get up to go use the bathroom, but told his wife that he could not get up.  She tried to help him up but patient reportedly completely went out and his legs were stiff, but arms were at the time.  His breathing at that time was very labored and he was never noted to have  lost pulse.  Patient denies any significant weight loss, but did note mild lower extremity leg swelling yesterday.  He admits that he has had progressively worsening shortness of breath, being constipated, and had been eating great at home due to loss of appetite. Denies any significant weight gain or weight loss.  Incidentally patient notes that he had recently worn a Holter monitor for 3 days and just sent it back to the company. EMS noted patient to be hypoxic down to 85% on room air, but reportedly does not wear oxygen at baseline.  Hospital Course / Discharge diagnoses: Principal Problem Acute hypoxic respiratory failure due to large right-sided pleural effusion-patient was admitted to the hospital with respiratory failure.  Imaging showed a large right-sided pleural effusion.  Pulmonary consulted and followed patient while hospitalized.  He underwent a thoracentesis on 04/25/2021 with 1.4 L of bloody fluid removed.  Cardiothoracic surgery also consulted.  He underwent serial H&H and repeat a chest x-ray which showed stability, no chest tube was indicated.  Repeat hemoglobin did not indicate further bleeding.  Discussed with pulmonology, he is stable for discharge, will have outpatient follow-up in a week with a repeat chest x-ray.  Hypoxia resolved  Active Problems Episode of unresponsiveness/syncope- ?  Arrhythmia.  She did have PVCs in the past and wore a Holter monitor for 3 days just a few weeks ago.  Cardiology consulted and followed patient while hospitalized.  He underwent an MRI of the brain which is negative for acute findings.  EEG did not indicate any seizures. SVT-developed overnight after admission,  received adenosine 6 mg as well as 12 mg without any improvement.  Received metoprolol which apparently slowed down and currently he is in sinus.  Cardiology consulted, no further events on metoprolol.  This is to be continued on discharge. Chronic diastolic CHF, history of MVP-status post  MitraClip.   Elevated troponin-likely demand, no chest pain Dizziness, orthostatic hypotension, weakness-this was likely in the setting of fluid enlarging pleural effusion.  Improved significantly after thoracentesis, able to work with PT without issues. Hyponatremia-acute on chronic, gradually improving.  His HCTZ was discontinued as an outpatient, continue to hold on discharge CKD 3A-Baseline creatinine 1-1.2, currently close to baseline Essential hypertension -hold hold HCTZ, continue metoprolol, ramipril Transaminitis-mild, question if secondary to passive congestion Hypothyroidism -TSH on the high side.  Patient takes his Synthroid along with his other medications likely decreasing its absorption.  He was counseled to take Synthroid first thing after he wakes up and allow at least 30 minutes to pass before taking his other medications Right inguinal hernia- Incidental, outpatient follow-up with general surgery Hyperlipidemia-on statin  Sepsis ruled out   Discharge Instructions   Allergies as of 04/26/2021       Reactions   Duloxetine Hcl Other (See Comments)   withdrawal        Medication List     STOP taking these medications    chlordiazePOXIDE 10 MG capsule Commonly known as: LIBRIUM       TAKE these medications    acetaminophen 500 MG tablet Commonly known as: TYLENOL Take 1,000 mg by mouth every 6 (six) hours as needed (for pain.).   amoxicillin 500 MG tablet Commonly known as: AMOXIL Take 4 capsules (2,000 mg) one hour prior to all dental visits.   aspirin 81 MG EC tablet Take 81 mg by mouth daily.   finasteride 5 MG tablet Commonly known as: PROSCAR Take 5 mg by mouth daily at 12 noon.   HYDROcodone-acetaminophen 7.5-325 MG tablet Commonly known as: NORCO Take 1 tablet by mouth daily as needed for moderate pain.   hydrocortisone cream 1 % Apply 1 application topically as needed for itching.   levothyroxine 75 MCG tablet Commonly known as:  SYNTHROID Take 75 mcg by mouth daily before breakfast.   metFORMIN 500 MG tablet Commonly known as: GLUCOPHAGE Take 500 mg by mouth every evening.   metoprolol tartrate 25 MG tablet Commonly known as: LOPRESSOR Take 0.5 tablets (12.5 mg total) by mouth 2 (two) times daily.   pravastatin 20 MG tablet Commonly known as: PRAVACHOL Take 20 mg by mouth at bedtime.   psyllium 58.6 % packet Commonly known as: METAMUCIL Take 1 packet by mouth daily.   ramipril 5 MG capsule Commonly known as: ALTACE Take 5 mg by mouth daily.   sodium chloride 0.65 % Soln nasal spray Commonly known as: OCEAN Place 1 spray into both nostrils daily as needed for congestion.   tamsulosin 0.4 MG Caps capsule Commonly known as: FLOMAX Take 0.4 mg by mouth daily at 12 noon.   traMADol 50 MG tablet Commonly known as: ULTRAM Take 50 mg by mouth 2 (two) times daily as needed.   Vitamin B-12 2000 MCG Tbcr 1 tablet   VITAMIN B1 PO Take 125 mcg by mouth daily.   Vitamin D 50 MCG (2000 UT) tablet Take 2,000 Units by mouth daily at 12 noon.         Consultations: Cardiology Pulmonology Cardiothoracic surgery Trauma surgery  Procedures/Studies:  CT HEAD WO CONTRAST  Result Date: 04/24/2021  CLINICAL DATA:  87 year old male new onset seizure. Became unresponsive. EXAM: CT HEAD WITHOUT CONTRAST TECHNIQUE: Contiguous axial images were obtained from the base of the skull through the vertex without intravenous contrast. COMPARISON:  None. FINDINGS: Brain: Cerebral volume is within normal limits for age. No midline shift, ventriculomegaly, mass effect, evidence of mass lesion, intracranial hemorrhage or evidence of cortically based acute infarction. Mild dystrophic calcifications bilateral basal ganglia. Gray-white matter differentiation within normal limits for age. No cortical encephalomalacia identified. Vascular: Calcified atherosclerosis at the skull base. No suspicious intracranial vascular  hyperdensity. Skull: No fracture identified. Sinuses/Orbits: Visualized paranasal sinuses and mastoids are well aerated. Other: No discrete orbit or scalp soft tissue injury identified. IMPRESSION: Normal for age non contrast CT appearance of the brain. No acute traumatic injury identified. Electronically Signed   By: Genevie Ann M.D.   On: 04/24/2021 07:39   CT Angio Chest PE W and/or Wo Contrast  Result Date: 04/24/2021 CLINICAL DATA:  Possible pulmonary embolism. Syncope at home when getting up to go to the bathroom. EXAM: CT ANGIOGRAPHY CHEST CT ABDOMEN AND PELVIS WITH CONTRAST TECHNIQUE: Multidetector CT imaging of the chest was performed using the standard protocol during bolus administration of intravenous contrast. Multiplanar CT image reconstructions and MIPs were obtained to evaluate the vascular anatomy. Multidetector CT imaging of the abdomen and pelvis was performed using the standard protocol during bolus administration of intravenous contrast. CONTRAST:  149mL OMNIPAQUE IOHEXOL 350 MG/ML SOLN COMPARISON:  CT abdomen/pelvis 10/08/2008 FINDINGS: CTA CHEST FINDINGS Cardiovascular: Mild-to-moderate cardiomegaly. Prosthetic mitral valve noted. Mild calcified plaque over the left main and right coronary arteries. Ascending thoracic aorta measures approximately 3.6 cm in AP diameter. Calcified plaque over the descending thoracic aorta. Pulmonary arterial system is well opacified and demonstrates no evidence of emboli. Mediastinum/Nodes: No definite mediastinal or hilar adenopathy. Remaining mediastinal structures are unremarkable. Lungs/Pleura: Moderate moderate to large right pleural fluid collection and small to moderate left pleural fluid collection with associated atelectatic change within the lung bases. No pneumothorax. Airways are unremarkable. Musculoskeletal: Numerous right posterior and lateral rib fractures involving ribs 6 through 11. Review of the MIP images confirms the above findings. CT  ABDOMEN and PELVIS FINDINGS Hepatobiliary: Liver and biliary tree are normal. Tiny amount of adjacent pericholecystic fluid as the gallbladder is unremarkable. Pancreas: Normal. Spleen: Normal. Adrenals/Urinary Tract: Adrenal glands are normal. Kidneys are normal in size without focal mass or hydronephrosis. Ureters and bladder are normal. Stomach/Bowel: Stomach and small bowel are normal. Diverticulosis of the colon with mild fecal retention throughout the colon. Appendix not visualized. Vascular/Lymphatic: Mild-to-moderate calcified plaque over the abdominal aorta which is normal in caliber. No adenopathy. Reproductive: Mild prostatic enlargement with moderate pressure upon the bladder base. Other: No significant free peritoneal fluid and no free peritoneal air. Small right inguinal hernia containing peritoneal fat and tiny amount of fluid. Musculoskeletal: Degenerative change of the spine. No acute fracture. Review of the MIP images confirms the above findings. IMPRESSION: 1. No evidence of pulmonary embolism. 2. Moderate to large right pleural fluid collection and small to moderate left pleural fluid collection with associated bibasilar atelectasis. No pneumothorax. Multiple associated mildly displaced and slightly comminuted fractures of posterolateral right ribs 6 through 11. 3. No acute findings in the abdomen/pelvis. 4. Colonic diverticulosis without active inflammation. 5. Mild prostatic enlargement with moderate impression upon the bladder base. 6. Small right inguinal hernia containing peritoneal fat and tiny amount of fluid. 7. Aortic atherosclerosis. Atherosclerotic coronary artery disease. Aortic Atherosclerosis (ICD10-I70.0). Electronically Signed  By: Marin Olp M.D.   On: 04/24/2021 09:09   MR BRAIN WO CONTRAST  Result Date: 04/24/2021 CLINICAL DATA:  Stroke follow-up EXAM: MRI HEAD WITHOUT CONTRAST TECHNIQUE: Multiplanar, multiecho pulse sequences of the brain and surrounding structures  were obtained without intravenous contrast. COMPARISON:  CT head 04/24/2021 FINDINGS: Brain: No acute infarction, hemorrhage, hydrocephalus, extra-axial collection or mass lesion. Minimal white matter changes. Mild cerebral atrophy. Vascular: Normal arterial flow voids at the skull base. Skull and upper cervical spine: No focal skeletal lesion. Sinuses/Orbits: Paranasal sinuses clear. Bilateral cataract extraction Other: None IMPRESSION: No acute abnormality. Electronically Signed   By: Franchot Gallo M.D.   On: 04/24/2021 17:47   CT ABDOMEN PELVIS W CONTRAST  Result Date: 04/24/2021 CLINICAL DATA:  Possible pulmonary embolism. Syncope at home when getting up to go to the bathroom. EXAM: CT ANGIOGRAPHY CHEST CT ABDOMEN AND PELVIS WITH CONTRAST TECHNIQUE: Multidetector CT imaging of the chest was performed using the standard protocol during bolus administration of intravenous contrast. Multiplanar CT image reconstructions and MIPs were obtained to evaluate the vascular anatomy. Multidetector CT imaging of the abdomen and pelvis was performed using the standard protocol during bolus administration of intravenous contrast. CONTRAST:  161mL OMNIPAQUE IOHEXOL 350 MG/ML SOLN COMPARISON:  CT abdomen/pelvis 10/08/2008 FINDINGS: CTA CHEST FINDINGS Cardiovascular: Mild-to-moderate cardiomegaly. Prosthetic mitral valve noted. Mild calcified plaque over the left main and right coronary arteries. Ascending thoracic aorta measures approximately 3.6 cm in AP diameter. Calcified plaque over the descending thoracic aorta. Pulmonary arterial system is well opacified and demonstrates no evidence of emboli. Mediastinum/Nodes: No definite mediastinal or hilar adenopathy. Remaining mediastinal structures are unremarkable. Lungs/Pleura: Moderate moderate to large right pleural fluid collection and small to moderate left pleural fluid collection with associated atelectatic change within the lung bases. No pneumothorax. Airways are  unremarkable. Musculoskeletal: Numerous right posterior and lateral rib fractures involving ribs 6 through 11. Review of the MIP images confirms the above findings. CT ABDOMEN and PELVIS FINDINGS Hepatobiliary: Liver and biliary tree are normal. Tiny amount of adjacent pericholecystic fluid as the gallbladder is unremarkable. Pancreas: Normal. Spleen: Normal. Adrenals/Urinary Tract: Adrenal glands are normal. Kidneys are normal in size without focal mass or hydronephrosis. Ureters and bladder are normal. Stomach/Bowel: Stomach and small bowel are normal. Diverticulosis of the colon with mild fecal retention throughout the colon. Appendix not visualized. Vascular/Lymphatic: Mild-to-moderate calcified plaque over the abdominal aorta which is normal in caliber. No adenopathy. Reproductive: Mild prostatic enlargement with moderate pressure upon the bladder base. Other: No significant free peritoneal fluid and no free peritoneal air. Small right inguinal hernia containing peritoneal fat and tiny amount of fluid. Musculoskeletal: Degenerative change of the spine. No acute fracture. Review of the MIP images confirms the above findings. IMPRESSION: 1. No evidence of pulmonary embolism. 2. Moderate to large right pleural fluid collection and small to moderate left pleural fluid collection with associated bibasilar atelectasis. No pneumothorax. Multiple associated mildly displaced and slightly comminuted fractures of posterolateral right ribs 6 through 11. 3. No acute findings in the abdomen/pelvis. 4. Colonic diverticulosis without active inflammation. 5. Mild prostatic enlargement with moderate impression upon the bladder base. 6. Small right inguinal hernia containing peritoneal fat and tiny amount of fluid. 7. Aortic atherosclerosis. Atherosclerotic coronary artery disease. Aortic Atherosclerosis (ICD10-I70.0). Electronically Signed   By: Marin Olp M.D.   On: 04/24/2021 09:09   DG CHEST PORT 1 VIEW  Result Date:  04/26/2021 CLINICAL DATA:  Shortness of breath, status post thoracentesis EXAM: PORTABLE CHEST  1 VIEW COMPARISON:  Previous studies including the examination of 04/25/2021 FINDINGS: Transverse diameter of heart is increased. There is prosthetic device in the region of mitral valve. Central pulmonary vessels are prominent. There are no signs of alveolar pulmonary edema. There is blunting of both lateral CP angles. Undisplaced fracture is seen in the posterolateral aspect of right ninth rib. Increased markings are seen in the lower lung fields with improvement in aeration in the left lower lung fields. There are no new focal pulmonary infiltrates. There is small right apical pneumothorax. IMPRESSION: Cardiomegaly. There are patchy infiltrates in both lower lung fields suggesting atelectasis/pneumonia with interval improvement in the aeration in the left lower lung fields. Small bilateral pleural effusions. There is minimal right apical pneumothorax. Electronically Signed   By: Elmer Picker M.D.   On: 04/26/2021 09:29   DG CHEST PORT 1 VIEW  Result Date: 04/25/2021 CLINICAL DATA:  Status post thoracentesis EXAM: PORTABLE CHEST 1 VIEW COMPARISON:  04/24/2021 chest CT angiogram FINDINGS: Stable cardiomediastinal silhouette with mild cardiomegaly. No pneumothorax. Residual small right pleural effusion, substantially decreased. Stable small left pleural effusion. No overt pulmonary edema. Patchy bibasilar lung opacities, decreased on the right and stable on the left. Redemonstration of posterolateral right lower rib fracture. IMPRESSION: 1. No pneumothorax. Residual small right pleural effusion, substantially decreased. 2. Stable small left pleural effusion. 3. Patchy bibasilar lung opacities, decreased on the right and stable on the left, favor atelectasis. 4. Stable cardiomegaly without overt pulmonary edema. Electronically Signed   By: Ilona Sorrel M.D.   On: 04/25/2021 15:24   EEG adult  Result Date:  04/26/2021 Lora Havens, MD     04/26/2021 11:00 AM Patient Name: Adrian Olson MRN: [id number] Epilepsy Attending: Lora Havens Referring Physician/Provider: Dr Marzetta Board Date: 04/26/2021 Duration: 23.23 mins Patient history: 87 year old male with an episode of unresponsiveness/syncope.  EEG evaluate for seizures. Level of alertness: Awake, drowsy AEDs during EEG study: None Technical aspects: This EEG study was done with scalp electrodes positioned according to the 10-20 International system of electrode placement. Electrical activity was acquired at a sampling rate of 500Hz  and reviewed with a high frequency filter of 70Hz  and a low frequency filter of 1Hz . EEG data were recorded continuously and digitally stored. Description: The posterior dominant rhythm consists of 8-9 Hz activity of moderate voltage (25-35 uV) seen predominantly in posterior head regions, symmetric and reactive to eye opening and eye closing. Drowsiness was characterized by attenuation of the posterior background rhythm. Hyperventilation and photic stimulation were not performed.   IMPRESSION: This study is within normal limits. No seizures or epileptiform discharges were seen throughout the recording. Priyanka Barbra Sarks     Subjective: - no chest pain, shortness of breath, no abdominal pain, nausea or vomiting.   Discharge Exam: BP 136/61 (BP Location: Left Arm)    Pulse 60    Temp 98 F (36.7 C) (Oral)    Resp 17    Ht 5\' 9"  (1.753 m)    Wt 63.7 kg    SpO2 100%    BMI 20.74 kg/m   General: Pt is alert, awake, not in acute distress Cardiovascular: RRR, S1/S2 +, no rubs, no gallops Respiratory: CTA bilaterally, no wheezing, no rhonchi Abdominal: Soft, NT, ND, bowel sounds + Extremities: no edema, no cyanosis  The results of significant diagnostics from this hospitalization (including imaging, microbiology, ancillary and laboratory) are listed below for reference.     Microbiology: Recent Results (from the  past 240  hour(s))  Resp Panel by RT-PCR (Flu A&B, Covid) Nasopharyngeal Swab     Status: None   Collection Time: 04/24/21  7:28 AM   Specimen: Nasopharyngeal Swab; Nasopharyngeal(NP) swabs in vial transport medium  Result Value Ref Range Status   SARS Coronavirus 2 by RT PCR NEGATIVE NEGATIVE Final    Comment: (NOTE) SARS-CoV-2 target nucleic acids are NOT DETECTED.  The SARS-CoV-2 RNA is generally detectable in upper respiratory specimens during the acute phase of infection. The lowest concentration of SARS-CoV-2 viral copies this assay can detect is 138 copies/mL. A negative result does not preclude SARS-Cov-2 infection and should not be used as the sole basis for treatment or other patient management decisions. A negative result may occur with  improper specimen collection/handling, submission of specimen other than nasopharyngeal swab, presence of viral mutation(s) within the areas targeted by this assay, and inadequate number of viral copies(<138 copies/mL). A negative result must be combined with clinical observations, patient history, and epidemiological information. The expected result is Negative.  Fact Sheet for Patients:  EntrepreneurPulse.com.au  Fact Sheet for Healthcare Providers:  IncredibleEmployment.be  This test is no t yet approved or cleared by the [place] FDA and  has been authorized for detection and/or diagnosis of SARS-CoV-2 by FDA under an Emergency Use Authorization (EUA). This EUA will remain  in effect (meaning this test can be used) for the duration of the COVID-19 declaration under Section 564(b)(1) of the Act, 21 U.S.C.section 360bbb-3(b)(1), unless the authorization is terminated  or revoked sooner.       Influenza A by PCR NEGATIVE NEGATIVE Final   Influenza B by PCR NEGATIVE NEGATIVE Final    Comment: (NOTE) The Xpert Xpress SARS-CoV-2/FLU/RSV plus assay is intended as an aid in the diagnosis of  influenza from Nasopharyngeal swab specimens and should not be used as a sole basis for treatment. Nasal washings and aspirates are unacceptable for Xpert Xpress SARS-CoV-2/FLU/RSV testing.  Fact Sheet for Patients: EntrepreneurPulse.com.au  Fact Sheet for Healthcare Providers: IncredibleEmployment.be  This test is not yet approved or cleared by the [place] FDA and has been authorized for detection and/or diagnosis of SARS-CoV-2 by FDA under an Emergency Use Authorization (EUA). This EUA will remain in effect (meaning this test can be used) for the duration of the COVID-19 declaration under Section 564(b)(1) of the Act, 21 U.S.C. section 360bbb-3(b)(1), unless the authorization is terminated or revoked.  Performed at [facility], [place] [address], [place], [place] [address]   Blood culture (routine x 2)     Status: None (Preliminary result)   Collection Time: 04/24/21  7:42 AM   Specimen: BLOOD RIGHT FOREARM  Result Value Ref Range Status   Specimen Description BLOOD RIGHT FOREARM  Final   Special Requests   Final    BOTTLES DRAWN AEROBIC AND ANAEROBIC Blood Culture adequate volume   Culture   Final    NO GROWTH 2 DAYS Performed at [facility], [place] [address], [place], [place] [address]    Report Status PENDING  Incomplete  Blood culture (routine x 2)     Status: None (Preliminary result)   Collection Time: 04/24/21  7:44 AM   Specimen: BLOOD  Result Value Ref Range Status   Specimen Description BLOOD LEFT ANTECUBITAL  Final   Special Requests   Final    BOTTLES DRAWN AEROBIC AND ANAEROBIC Blood Culture adequate volume   Culture   Final    NO GROWTH 2 DAYS Performed at [facility], [place] [address] [place], [place] [address]    Report Status PENDING  Incomplete  Body fluid culture w Gram Stain     Status: None (Preliminary result)   Collection Time: 04/25/21 11:53 AM   Specimen: Pleura; Body Fluid  Result  Value Ref Range Status   Specimen Description PLEURAL FLUID  Final   Special Requests RIGHT  Final   Gram Stain   Final    FEW WBC PRESENT,BOTH PMN AND MONONUCLEAR NO ORGANISMS SEEN    Culture   Final    NO GROWTH < 24 HOURS Performed at [facility], [place] [address], [place], [place] [address]    Report Status PENDING  Incomplete  MRSA Next Gen by PCR, Nasal     Status: None   Collection Time: 04/25/21  7:16 PM   Specimen: Nasal Mucosa; Nasal Swab  Result Value Ref Range Status   MRSA by PCR Next Gen NOT DETECTED NOT DETECTED Final    Comment: (NOTE) The GeneXpert MRSA Assay (FDA approved for NASAL specimens only), is one component of a comprehensive MRSA colonization surveillance program. It is not intended to diagnose MRSA infection nor to guide or monitor treatment for MRSA infections. Test performance is not FDA approved in patients less than 65 years old. Performed at [facility], [place] [address], [place],  [address]      Labs: Basic Metabolic Panel: Recent Labs  Lab 04/24/21 0555 04/24/21 0747 04/24/21 0748 04/24/21 2341 04/25/21 0327 04/25/21 0339 04/26/21 0425  NA 123* 127* 126* 126* 126*  --  128*  K 4.5 4.5 4.5 4.0 3.8  --  4.2  CL 92*  --  94* 94* 94*  --  96*  CO2 20*  --   --  22 20*  --  23  GLUCOSE 178*  --  140* 181* 145*  --  140*  BUN 21  --  22 23 21   --  22  CREATININE 1.33*  --  1.20 1.22 1.20  --  1.25*  CALCIUM 9.1  --   --  9.2 9.0  --  8.5*  MG  --   --   --   --   --  1.8  --    Liver Function Tests: Recent Labs  Lab 04/24/21 0555  AST 114*  ALT 110*  ALKPHOS 116  BILITOT 0.7  PROT 6.5  ALBUMIN 3.5   CBC: Recent Labs  Lab 04/24/21 0555 04/24/21 0747 04/25/21 0327 04/25/21 1203 04/25/21 1918 04/26/21 0016 04/26/21 0425 04/26/21 0752  WBC 8.2  --  8.0  --   --   --   --   --   NEUTROABS 5.9  --   --   --   --   --   --   --   HGB 11.6*   < > 11.7* 11.7* 11.0* 10.9* 11.2* 11.2*  HCT 34.8*   < > 34.0*  34.8* 32.5* 32.0* 32.7* 33.2*  MCV 97.2  --  95.0  --   --   --   --   --   PLT 247  --  251  --   --   --   --   --    < > = values in this interval not displayed.   CBG: Recent Labs  Lab 04/24/21 0630 04/26/21 1209  GLUCAP 154* 169*   Hgb A1c No results for input(s): HGBA1C in the last 72 hours. Lipid Profile No  results for input(s): CHOL, HDL, LDLCALC, TRIG, CHOLHDL, LDLDIRECT in the last 72 hours. Thyroid function studies Recent Labs    04/25/21 0339  TSH 9.172*   Urinalysis    Component Value Date/Time   COLORURINE YELLOW 04/24/2021 0624   APPEARANCEUR CLEAR 04/24/2021 0624   LABSPEC 1.014 04/24/2021 0624   PHURINE 6.0 04/24/2021 0624   GLUCOSEU NEGATIVE 04/24/2021 0624   HGBUR SMALL (A) 04/24/2021 0624   BILIRUBINUR NEGATIVE 04/24/2021 0624   KETONESUR NEGATIVE 04/24/2021 0624   PROTEINUR 30 (A) 04/24/2021 0624   NITRITE NEGATIVE 04/24/2021 0624   LEUKOCYTESUR NEGATIVE 04/24/2021 7353    FURTHER DISCHARGE INSTRUCTIONS:   Get Medicines reviewed and adjusted: Please take all your medications with you for your next visit with your Primary MD   Laboratory/radiological data: Please request your Primary MD to go over all hospital tests and procedure/radiological results at the follow up, please ask your Primary MD to get all Hospital records sent to his/her office.   In some cases, they will be blood work, cultures and biopsy results pending at the time of your discharge. Please request that your primary care M.D. goes through all the records of your hospital data and follows up on these results.   Also Note the following: If you experience worsening of your admission symptoms, develop shortness of breath, life threatening emergency, suicidal or homicidal thoughts you must seek medical attention immediately by calling 911 or calling your MD immediately  if symptoms less severe.   You must read complete instructions/literature along with all the possible adverse  reactions/side effects for all the Medicines you take and that have been prescribed to you. Take any new Medicines after you have completely understood and accpet all the possible adverse reactions/side effects.    Do not drive when taking Pain medications or sleeping medications (Benzodaizepines)   Do not take more than prescribed Pain, Sleep and Anxiety Medications. It is not advisable to combine anxiety,sleep and pain medications without talking with your primary care practitioner   Special Instructions: If you have smoked or chewed Tobacco  in the last 2 yrs please stop smoking, stop any regular Alcohol  and or any Recreational drug use.   Wear Seat belts while driving.   Please note: You were cared for by a hospitalist during your hospital stay. Once you are discharged, your primary care physician will handle any further medical issues. Please note that NO REFILLS for any discharge medications will be authorized once you are discharged, as it is imperative that you return to your primary care physician (or establish a relationship with a primary care physician if you do not have one) for your post hospital discharge needs so that they can reassess your need for medications and monitor your lab values.  Time coordinating discharge: 40 minutes  SIGNED:  Marzetta Board, MD, PhD 04/26/2021, 1:31 PM

## 2021-04-26 NOTE — Plan of Care (Signed)

## 2021-04-26 NOTE — TOC Transition Note (Signed)
Transition of Care [facility]) - CM/SW Discharge Note   Patient Details  Name: Adrian Olson MRN: [id number] Date of Birth: December 27, 1930  Transition of [facility]) CM/SW Contact:  Zenon Mayo, RN Phone Number: 04/26/2021, 3:09 PM   Clinical Narrative:    Patient is for dc today, NCM offered choice to him he states he has no preference.  NCM made referral to [facility] with [place] for [place], [place], she is able to take referral. Soc will begin 24 to 48 hrs post dc.  Patient states his daughter will transport him home at discharge.   Final next level of care: [place] Barriers to Discharge: No Barriers Identified   Patient Goals and CMS Choice Patient states their goals for this hospitalization and ongoing recovery are:: return home with [facility] CMS Medicare.gov Compare Post Acute Care list provided to:: Patient Choice offered to / list presented to : Patient  Discharge Placement                       Discharge Plan and Services                  DME Agency: NA       HH Arranged: PT, OT HH Agency: [place] Date [place]: 04/26/21 Time [place]: 1509 Representative spoke with at [place]: [place] ([place]) Interventions     Readmission Risk Interventions No flowsheet data found.

## 2021-04-28 LAB — BODY FLUID CULTURE W GRAM STAIN: Culture: NO GROWTH

## 2021-04-29 DIAGNOSIS — E039 Hypothyroidism, unspecified: Secondary | ICD-10-CM | POA: Diagnosis not present

## 2021-04-29 DIAGNOSIS — S2241XD Multiple fractures of ribs, right side, subsequent encounter for fracture with routine healing: Secondary | ICD-10-CM | POA: Diagnosis not present

## 2021-04-29 DIAGNOSIS — N1831 Chronic kidney disease, stage 3a: Secondary | ICD-10-CM | POA: Diagnosis not present

## 2021-04-29 DIAGNOSIS — E785 Hyperlipidemia, unspecified: Secondary | ICD-10-CM | POA: Diagnosis not present

## 2021-04-29 DIAGNOSIS — I5033 Acute on chronic diastolic (congestive) heart failure: Secondary | ICD-10-CM | POA: Diagnosis not present

## 2021-04-29 DIAGNOSIS — I130 Hypertensive heart and chronic kidney disease with heart failure and stage 1 through stage 4 chronic kidney disease, or unspecified chronic kidney disease: Secondary | ICD-10-CM | POA: Diagnosis not present

## 2021-04-29 DIAGNOSIS — E1122 Type 2 diabetes mellitus with diabetic chronic kidney disease: Secondary | ICD-10-CM | POA: Diagnosis not present

## 2021-04-29 DIAGNOSIS — R69 Illness, unspecified: Secondary | ICD-10-CM | POA: Diagnosis not present

## 2021-04-29 DIAGNOSIS — S272XXD Traumatic hemopneumothorax, subsequent encounter: Secondary | ICD-10-CM | POA: Diagnosis not present

## 2021-04-29 DIAGNOSIS — J90 Pleural effusion, not elsewhere classified: Secondary | ICD-10-CM | POA: Diagnosis not present

## 2021-04-29 LAB — CULTURE, BLOOD (ROUTINE X 2)
Culture: NO GROWTH
Culture: NO GROWTH
Special Requests: ADEQUATE
Special Requests: ADEQUATE

## 2021-05-02 ENCOUNTER — Other Ambulatory Visit: Payer: Medicare HMO

## 2021-05-03 ENCOUNTER — Encounter: Payer: Self-pay | Admitting: Pulmonary Disease

## 2021-05-03 ENCOUNTER — Encounter (HOSPITAL_COMMUNITY): Payer: Self-pay | Admitting: Internal Medicine

## 2021-05-03 ENCOUNTER — Other Ambulatory Visit: Payer: Self-pay

## 2021-05-03 ENCOUNTER — Ambulatory Visit (INDEPENDENT_AMBULATORY_CARE_PROVIDER_SITE_OTHER): Payer: Medicare HMO

## 2021-05-03 ENCOUNTER — Inpatient Hospital Stay (HOSPITAL_COMMUNITY)
Admission: AD | Admit: 2021-05-03 | Discharge: 2021-05-09 | DRG: 200 | Disposition: A | Discharge status: Home Health | Payer: Medicare HMO | Source: Ambulatory Visit | Attending: Emergency Medicine | Admitting: Emergency Medicine

## 2021-05-03 ENCOUNTER — Ambulatory Visit: Payer: Medicare HMO | Admitting: Pulmonary Disease

## 2021-05-03 VITALS — BP 128/76 | HR 63

## 2021-05-03 DIAGNOSIS — E039 Hypothyroidism, unspecified: Secondary | ICD-10-CM | POA: Diagnosis not present

## 2021-05-03 DIAGNOSIS — N400 Enlarged prostate without lower urinary tract symptoms: Secondary | ICD-10-CM | POA: Diagnosis present

## 2021-05-03 DIAGNOSIS — F32A Depression, unspecified: Secondary | ICD-10-CM | POA: Diagnosis present

## 2021-05-03 DIAGNOSIS — J9811 Atelectasis: Secondary | ICD-10-CM | POA: Diagnosis present

## 2021-05-03 DIAGNOSIS — E860 Dehydration: Secondary | ICD-10-CM | POA: Diagnosis present

## 2021-05-03 DIAGNOSIS — E785 Hyperlipidemia, unspecified: Secondary | ICD-10-CM | POA: Diagnosis present

## 2021-05-03 DIAGNOSIS — I517 Cardiomegaly: Secondary | ICD-10-CM | POA: Diagnosis not present

## 2021-05-03 DIAGNOSIS — S2241XS Multiple fractures of ribs, right side, sequela: Secondary | ICD-10-CM | POA: Diagnosis not present

## 2021-05-03 DIAGNOSIS — W19XXXA Unspecified fall, initial encounter: Secondary | ICD-10-CM | POA: Diagnosis not present

## 2021-05-03 DIAGNOSIS — S2241XA Multiple fractures of ribs, right side, initial encounter for closed fracture: Secondary | ICD-10-CM | POA: Diagnosis present

## 2021-05-03 DIAGNOSIS — Z833 Family history of diabetes mellitus: Secondary | ICD-10-CM | POA: Diagnosis not present

## 2021-05-03 DIAGNOSIS — J90 Pleural effusion, not elsewhere classified: Secondary | ICD-10-CM | POA: Diagnosis not present

## 2021-05-03 DIAGNOSIS — I509 Heart failure, unspecified: Secondary | ICD-10-CM | POA: Diagnosis not present

## 2021-05-03 DIAGNOSIS — R519 Headache, unspecified: Secondary | ICD-10-CM | POA: Diagnosis not present

## 2021-05-03 DIAGNOSIS — F419 Anxiety disorder, unspecified: Secondary | ICD-10-CM | POA: Diagnosis present

## 2021-05-03 DIAGNOSIS — E871 Hypo-osmolality and hyponatremia: Secondary | ICD-10-CM | POA: Diagnosis not present

## 2021-05-03 DIAGNOSIS — J918 Pleural effusion in other conditions classified elsewhere: Secondary | ICD-10-CM | POA: Diagnosis not present

## 2021-05-03 DIAGNOSIS — Z7984 Long term (current) use of oral hypoglycemic drugs: Secondary | ICD-10-CM

## 2021-05-03 DIAGNOSIS — R0602 Shortness of breath: Secondary | ICD-10-CM | POA: Diagnosis not present

## 2021-05-03 DIAGNOSIS — Z79890 Hormone replacement therapy (postmenopausal): Secondary | ICD-10-CM | POA: Diagnosis not present

## 2021-05-03 DIAGNOSIS — Z888 Allergy status to other drugs, medicaments and biological substances status: Secondary | ICD-10-CM | POA: Diagnosis not present

## 2021-05-03 DIAGNOSIS — I341 Nonrheumatic mitral (valve) prolapse: Secondary | ICD-10-CM | POA: Diagnosis present

## 2021-05-03 DIAGNOSIS — E119 Type 2 diabetes mellitus without complications: Secondary | ICD-10-CM | POA: Diagnosis not present

## 2021-05-03 DIAGNOSIS — R54 Age-related physical debility: Secondary | ICD-10-CM | POA: Diagnosis not present

## 2021-05-03 DIAGNOSIS — J948 Other specified pleural conditions: Secondary | ICD-10-CM | POA: Diagnosis not present

## 2021-05-03 DIAGNOSIS — Z978 Presence of other specified devices: Secondary | ICD-10-CM | POA: Diagnosis not present

## 2021-05-03 DIAGNOSIS — Z7982 Long term (current) use of aspirin: Secondary | ICD-10-CM

## 2021-05-03 DIAGNOSIS — I340 Nonrheumatic mitral (valve) insufficiency: Secondary | ICD-10-CM | POA: Diagnosis present

## 2021-05-03 DIAGNOSIS — E1169 Type 2 diabetes mellitus with other specified complication: Secondary | ICD-10-CM

## 2021-05-03 DIAGNOSIS — S272XXA Traumatic hemopneumothorax, initial encounter: Principal | ICD-10-CM | POA: Diagnosis present

## 2021-05-03 DIAGNOSIS — I10 Essential (primary) hypertension: Secondary | ICD-10-CM | POA: Diagnosis present

## 2021-05-03 DIAGNOSIS — Z823 Family history of stroke: Secondary | ICD-10-CM | POA: Diagnosis not present

## 2021-05-03 DIAGNOSIS — R296 Repeated falls: Secondary | ICD-10-CM | POA: Diagnosis present

## 2021-05-03 DIAGNOSIS — Z8249 Family history of ischemic heart disease and other diseases of the circulatory system: Secondary | ICD-10-CM

## 2021-05-03 DIAGNOSIS — Z79899 Other long term (current) drug therapy: Secondary | ICD-10-CM

## 2021-05-03 DIAGNOSIS — G4700 Insomnia, unspecified: Secondary | ICD-10-CM | POA: Diagnosis present

## 2021-05-03 DIAGNOSIS — K5900 Constipation, unspecified: Secondary | ICD-10-CM | POA: Diagnosis not present

## 2021-05-03 DIAGNOSIS — J939 Pneumothorax, unspecified: Secondary | ICD-10-CM | POA: Diagnosis not present

## 2021-05-03 DIAGNOSIS — I3139 Other pericardial effusion (noninflammatory): Secondary | ICD-10-CM | POA: Diagnosis not present

## 2021-05-03 DIAGNOSIS — Z9181 History of falling: Secondary | ICD-10-CM

## 2021-05-03 DIAGNOSIS — J811 Chronic pulmonary edema: Secondary | ICD-10-CM | POA: Diagnosis not present

## 2021-05-03 DIAGNOSIS — I700 Atherosclerosis of aorta: Secondary | ICD-10-CM | POA: Diagnosis not present

## 2021-05-03 DIAGNOSIS — Z95818 Presence of other cardiac implants and grafts: Secondary | ICD-10-CM

## 2021-05-03 LAB — CBC WITH DIFFERENTIAL/PLATELET
Abs Immature Granulocytes: 0.04 10*3/uL (ref 0.00–0.07)
Basophils Absolute: 0.1 10*3/uL (ref 0.0–0.1)
Basophils Relative: 1 %
Eosinophils Absolute: 0.2 10*3/uL (ref 0.0–0.5)
Eosinophils Relative: 3 %
HCT: 34.7 % — ABNORMAL LOW (ref 39.0–52.0)
Hemoglobin: 11.6 g/dL — ABNORMAL LOW (ref 13.0–17.0)
Immature Granulocytes: 1 %
Lymphocytes Relative: 26 %
Lymphs Abs: 2.2 10*3/uL (ref 0.7–4.0)
MCH: 32.2 pg (ref 26.0–34.0)
MCHC: 33.4 g/dL (ref 30.0–36.0)
MCV: 96.4 fL (ref 80.0–100.0)
Monocytes Absolute: 0.8 10*3/uL (ref 0.1–1.0)
Monocytes Relative: 9 %
Neutro Abs: 5.2 10*3/uL (ref 1.7–7.7)
Neutrophils Relative %: 60 %
Platelets: 285 10*3/uL (ref 150–400)
RBC: 3.6 MIL/uL — ABNORMAL LOW (ref 4.22–5.81)
RDW: 13.4 % (ref 11.5–15.5)
WBC: 8.5 10*3/uL (ref 4.0–10.5)
nRBC: 0 % (ref 0.0–0.2)

## 2021-05-03 LAB — COMPREHENSIVE METABOLIC PANEL
ALT: 50 U/L — ABNORMAL HIGH (ref 0–44)
AST: 46 U/L — ABNORMAL HIGH (ref 15–41)
Albumin: 3.4 g/dL — ABNORMAL LOW (ref 3.5–5.0)
Alkaline Phosphatase: 101 U/L (ref 38–126)
Anion gap: 8 (ref 5–15)
BUN: 26 mg/dL — ABNORMAL HIGH (ref 8–23)
CO2: 22 mmol/L (ref 22–32)
Calcium: 9 mg/dL (ref 8.9–10.3)
Chloride: 92 mmol/L — ABNORMAL LOW (ref 98–111)
Creatinine, Ser: 1.27 mg/dL — ABNORMAL HIGH (ref 0.61–1.24)
GFR, Estimated: 54 mL/min — ABNORMAL LOW (ref >60)
Glucose, Bld: 159 mg/dL — ABNORMAL HIGH (ref 70–99)
Potassium: 5 mmol/L (ref 3.5–5.1)
Sodium: 122 mmol/L — ABNORMAL LOW (ref 135–145)
Total Bilirubin: 0.6 mg/dL (ref 0.3–1.2)
Total Protein: 6.7 g/dL (ref 6.5–8.1)

## 2021-05-03 LAB — PROTIME-INR
INR: 1.3 — ABNORMAL HIGH (ref 0.8–1.2)
Prothrombin Time: 15.8 seconds — ABNORMAL HIGH (ref 11.4–15.2)

## 2021-05-03 LAB — BASIC METABOLIC PANEL
Anion gap: 9 (ref 5–15)
BUN: 25 mg/dL — ABNORMAL HIGH (ref 8–23)
CO2: 21 mmol/L — ABNORMAL LOW (ref 22–32)
Calcium: 9.3 mg/dL (ref 8.9–10.3)
Chloride: 94 mmol/L — ABNORMAL LOW (ref 98–111)
Creatinine, Ser: 1.01 mg/dL (ref 0.61–1.24)
GFR, Estimated: >60 mL/min (ref >60)
Glucose, Bld: 151 mg/dL — ABNORMAL HIGH (ref 70–99)
Potassium: 4.8 mmol/L (ref 3.5–5.1)
Sodium: 124 mmol/L — ABNORMAL LOW (ref 135–145)

## 2021-05-03 MED ORDER — SODIUM CHLORIDE 0.9 % IV SOLN: INTRAVENOUS | Status: AC

## 2021-05-03 MED ORDER — TRAMADOL HCL 50 MG PO TABS
50.0000 mg | ORAL_TABLET | Freq: Four times a day (QID) | ORAL | Status: DC | PRN
  Administered 2021-05-04 – 2021-05-05 (×3): 50 mg via ORAL
  Filled 2021-05-03 (×3): qty 1

## 2021-05-03 MED ORDER — MELATONIN 5 MG PO TABS
5.0000 mg | ORAL_TABLET | Freq: Once | ORAL | Status: AC
  Administered 2021-05-03: 23:00:00 5 mg via ORAL
  Filled 2021-05-03: qty 1

## 2021-05-03 NOTE — Patient Instructions (Signed)
We have placed you on the direct-admit wait list. The hospital will call you when a bed is available.   Our teams at either hospital will await your arrival and plan to place a chest tube on the right side and evaluate the left side for fluid as well.   If you develop worsening symptoms then go to the Emergency Room

## 2021-05-03 NOTE — Progress Notes (Signed)
Synopsis: Referred in December 2022 for pleural effusion after hospitalization  Subjective:   PATIENT ID: Adrian Olson GENDER: male DOB: 06-12-1930, MRN: [id number]   HPI  Chief Complaint  Patient presents with   Hospitalization Follow-up    2 wk f/u after being in the hospital. Was doing great at home until yesterday. Extremely weak and SOB. Does not feel like himself.    Adrian Olson is an 87 year old male, never smoker with history of mitral valve prolapse and mitral regurgitation with mitral valve clip, DM II, and hypertension who was recently admitted after multiple falls at home found to have pleural effusion.   Patient had thoracentesis performed on 04/25/2021 with concern for hemothorax due to his fall and multiple rib fractures on the right side.  Follow-up chest x-ray on 12/13 showed resolution of the pleural effusion with no reaccumulation of fluid.  Patient had an ambulatory SPO2 evaluation with no desaturations and requirement for supplemental oxygen.  He was discharged home on 12/13.  Since discharge patient was initially feeling okay at home but reports since yesterday he has noticed increasing shortness of breath and fatigue.  He denies any fevers, chills or cough.  Chest radiograph today shows return of the right pleural effusion and small left pleural effusion.  Past Medical History:  Diagnosis Date   Bilateral carotid artery disease (HCC)    Severe Left and moderate Right   Diabetes mellitus without complication [facility])    ED (erectile dysfunction)    Hyperlipidemia    Hypertension    Lumbar disc disease    Mitral valve prolapse    moderate to severe MR, LVEF 55% Echo 2010   Moderate mitral regurgitation 03/16/2014   S/P mitral valve clip implantation 10/09/2019   s/p TEER using 2 MitraClip XTW devices, first device positioned A2 P2, second device positioned A1 P1 by Dr. Burt Knack     Family History  Problem Relation Age of Onset   Cancer Mother         ovarian   Diabetes Father    Other Father        enlarged prostate   Stroke Father    Heart disease Father    Heart disease Sister    Obesity Sister    Dementia Sister    Diabetes Sister    Other Sister        nornmal old age problems     Social History   Socioeconomic History   Marital status: Married    Spouse name: Not on file   Number of children: Not on file   Years of education: Not on file   Highest education level: Not on file  Occupational History   Not on file  Tobacco Use   Smoking status: Never   Smokeless tobacco: Never  Vaping Use   Vaping Use: Never used  Substance and Sexual Activity   Alcohol use: No    Alcohol/week: 0.0 standard drinks   Drug use: No   Sexual activity: Not on file  Other Topics Concern   Not on file  Social History Narrative   Not on file   Social Determinants of Health   Financial Resource Strain: Not on file  Food Insecurity: Not on file  Transportation Needs: Not on file  Physical Activity: Not on file  Stress: Not on file  Social Connections: Not on file  Intimate Partner Violence: Not on file     Allergies  Allergen Reactions   Duloxetine Hcl Other (  See Comments)    withdrawal     Outpatient Medications Prior to Visit  Medication Sig Dispense Refill   acetaminophen (TYLENOL) 500 MG tablet Take 1,000 mg by mouth every 6 (six) hours as needed (for pain.).     amoxicillin (AMOXIL) 500 MG tablet Take 4 capsules (2,000 mg) one hour prior to all dental visits. 8 tablet 12   aspirin 81 MG EC tablet Take 81 mg by mouth daily.     Cholecalciferol (VITAMIN D) 2000 UNITS tablet Take 2,000 Units by mouth daily at 12 noon.      Cyanocobalamin (VITAMIN B-12) 2000 MCG TBCR 1 tablet     finasteride (PROSCAR) 5 MG tablet Take 5 mg by mouth daily at 12 noon.      HYDROcodone-acetaminophen (NORCO) 7.5-325 MG tablet Take 1 tablet by mouth daily as needed for moderate pain.     hydrocortisone cream 1 % Apply 1 application topically  as needed for itching.     levothyroxine (SYNTHROID, LEVOTHROID) 75 MCG tablet Take 75 mcg by mouth daily before breakfast.     metFORMIN (GLUCOPHAGE) 500 MG tablet Take 500 mg by mouth every evening.      metoprolol tartrate (LOPRESSOR) 25 MG tablet Take 0.5 tablets (12.5 mg total) by mouth 2 (two) times daily. 30 tablet 0   pravastatin (PRAVACHOL) 20 MG tablet Take 20 mg by mouth at bedtime.      psyllium (METAMUCIL) 58.6 % packet Take 1 packet by mouth daily.     ramipril (ALTACE) 5 MG capsule Take 5 mg by mouth daily.      sodium chloride (OCEAN) 0.65 % SOLN nasal spray Place 1 spray into both nostrils daily as needed for congestion.     tamsulosin (FLOMAX) 0.4 MG CAPS capsule Take 0.4 mg by mouth daily at 12 noon.      Thiamine HCl (VITAMIN B1 PO) Take 125 mcg by mouth daily.     traMADol (ULTRAM) 50 MG tablet Take 50 mg by mouth 2 (two) times daily as needed.     No facility-administered medications prior to visit.    Review of Systems  Constitutional:  Positive for malaise/fatigue. Negative for chills, fever and weight loss.  HENT:  Negative for congestion, sinus pain and sore throat.   Eyes: Negative.   Respiratory:  Positive for shortness of breath. Negative for cough, hemoptysis, sputum production and wheezing.   Cardiovascular:  Negative for chest pain, palpitations, orthopnea, claudication and leg swelling.  Gastrointestinal:  Negative for abdominal pain, heartburn, nausea and vomiting.  Genitourinary: Negative.   Musculoskeletal:  Negative for joint pain and myalgias.  Skin:  Negative for rash.  Neurological:  Negative for weakness.  Endo/Heme/Allergies: Negative.   Psychiatric/Behavioral: Negative.     Objective:   Vitals:   05/03/21 1524  BP: 128/76  Pulse: 63  SpO2: 94%   Physical Exam Constitutional:      General: He is not in acute distress. HENT:     Head: Normocephalic and atraumatic.  Eyes:     Extraocular Movements: Extraocular movements intact.      Conjunctiva/sclera: Conjunctivae normal.     Pupils: Pupils are equal, round, and reactive to light.  Cardiovascular:     Rate and Rhythm: Normal rate and regular rhythm.     Pulses: Normal pulses.     Heart sounds: Normal heart sounds. No murmur heard. Pulmonary:     Effort: Respiratory distress (Mild) present.     Breath sounds: Examination of the right-middle field  reveals decreased breath sounds. Examination of the right-lower field reveals decreased breath sounds. Decreased breath sounds present. No wheezing, rhonchi or rales.  Abdominal:     General: Bowel sounds are normal.     Palpations: Abdomen is soft.  Musculoskeletal:     Right lower leg: No edema.     Left lower leg: No edema.  Lymphadenopathy:     Cervical: No cervical adenopathy.  Skin:    General: Skin is warm and dry.  Neurological:     General: No focal deficit present.     Mental Status: He is alert.  Psychiatric:        Mood and Affect: Mood normal.        Behavior: Behavior normal.        Thought Content: Thought content normal.        Judgment: Judgment normal.    CBC    Component Value Date/Time   WBC 8.0 04/25/2021 0327   RBC 3.58 (L) 04/25/2021 0327   HGB 11.2 (L) 04/26/2021 0752   HGB 12.7 (L) 09/08/2019 1121   HCT 33.2 (L) 04/26/2021 0752   HCT 36.8 (L) 09/08/2019 1121   PLT 251 04/25/2021 0327   PLT 191 09/08/2019 1121   MCV 95.0 04/25/2021 0327   MCV 92 09/08/2019 1121   MCH 32.7 04/25/2021 0327   MCHC 34.4 04/25/2021 0327   RDW 13.5 04/25/2021 0327   RDW 11.8 09/08/2019 1121   LYMPHSABS 1.6 04/24/2021 0555   MONOABS 0.6 04/24/2021 0555   EOSABS 0.1 04/24/2021 0555   BASOSABS 0.0 04/24/2021 0555   BMP Latest Ref Rng & Units 04/26/2021 04/25/2021 04/24/2021  Glucose 70 - 99 mg/dL 140(H) 145(H) 181(H)  BUN 8 - 23 mg/dL 22 21 23   Creatinine 0.61 - 1.24 mg/dL 1.25(H) 1.20 1.22  BUN/Creat Ratio 10 - 24 - - -  Sodium 135 - 145 mmol/L 128(L) 126(L) 126(L)  Potassium 3.5 - 5.1 mmol/L 4.2  3.8 4.0  Chloride 98 - 111 mmol/L 96(L) 94(L) 94(L)  CO2 22 - 32 mmol/L 23 20(L) 22  Calcium 8.9 - 10.3 mg/dL 8.5(L) 9.0 9.2   Chest imaging: CXR 05/03/21 Return of right pleural effusion and small left effusion.   PFT: No flowsheet data found.  Labs:  Path:  Echo 04/26/21: LV EF 55-60%. RV function and size is normal. LA moderately dilated. RA moderately dilated.     Assessment & Plan:   Pleural effusion - Plan: DG Chest 2 View  Discussion: Adrian Olson is an 87 year old male, never smoker with history of mitral valve prolapse and mitral regurgitation with mitral valve clip, DM II, and hypertension who was recently admitted after multiple falls at home found to have pleural effusion s/p thoracentesis.   Patient's right pleural effusion was concerning for hemothorax in setting of nondisplaced rib fractures.  The pleural fluid did not meet exudative criteria based on LDH or total protein ratios.  Cytology is negative for malignant cells.  Unfortunately the fluid has returned and the patient is symptomatic once again with shortness of breath.  We have arranged for patient to be directly admitted to the hospital either [facility] or [facility].  I have notified the pulmonary services at each hospital about his arrival and need for right pigtail catheter chest tube.  He will also need evaluation of the left pleural space and possible thoracentesis.  The plan was discussed with the patient, his wife and his daughter.  They expressed understanding and if his  symptoms were to significantly progress before receiving a bed at the hospital then he is to present to the emergency department.  We will schedule patient for follow up in 4-6 weeks.  Freda Jackson, MD [facility] Office: [phone]     Current Outpatient Medications:    acetaminophen (TYLENOL) 500 MG tablet, Take 1,000 mg by mouth every 6 (six) hours as needed (for pain.)., Disp: , Rfl:     amoxicillin (AMOXIL) 500 MG tablet, Take 4 capsules (2,000 mg) one hour prior to all dental visits., Disp: 8 tablet, Rfl: 12   aspirin 81 MG EC tablet, Take 81 mg by mouth daily., Disp: , Rfl:    Cholecalciferol (VITAMIN D) 2000 UNITS tablet, Take 2,000 Units by mouth daily at 12 noon. , Disp: , Rfl:    Cyanocobalamin (VITAMIN B-12) 2000 MCG TBCR, 1 tablet, Disp: , Rfl:    finasteride (PROSCAR) 5 MG tablet, Take 5 mg by mouth daily at 12 noon. , Disp: , Rfl:    HYDROcodone-acetaminophen (NORCO) 7.5-325 MG tablet, Take 1 tablet by mouth daily as needed for moderate pain., Disp: , Rfl:    hydrocortisone cream 1 %, Apply 1 application topically as needed for itching., Disp: , Rfl:    levothyroxine (SYNTHROID, LEVOTHROID) 75 MCG tablet, Take 75 mcg by mouth daily before breakfast., Disp: , Rfl:    metFORMIN (GLUCOPHAGE) 500 MG tablet, Take 500 mg by mouth every evening. , Disp: , Rfl:    metoprolol tartrate (LOPRESSOR) 25 MG tablet, Take 0.5 tablets (12.5 mg total) by mouth 2 (two) times daily., Disp: 30 tablet, Rfl: 0   pravastatin (PRAVACHOL) 20 MG tablet, Take 20 mg by mouth at bedtime. , Disp: , Rfl:    psyllium (METAMUCIL) 58.6 % packet, Take 1 packet by mouth daily., Disp: , Rfl:    ramipril (ALTACE) 5 MG capsule, Take 5 mg by mouth daily. , Disp: , Rfl:    sodium chloride (OCEAN) 0.65 % SOLN nasal spray, Place 1 spray into both nostrils daily as needed for congestion., Disp: , Rfl:    tamsulosin (FLOMAX) 0.4 MG CAPS capsule, Take 0.4 mg by mouth daily at 12 noon. , Disp: , Rfl:    Thiamine HCl (VITAMIN B1 PO), Take 125 mcg by mouth daily., Disp: , Rfl:    traMADol (ULTRAM) 50 MG tablet, Take 50 mg by mouth 2 (two) times daily as needed., Disp: , Rfl:

## 2021-05-03 NOTE — H&P (Addendum)
NAME:  RESEAN Olson, MRN:  [id number], DOB:  Mar 03, 1931, LOS: 0 ADMISSION DATE:  (Not on file), CONSULTATION DATE:  12/20 REFERRING MD: Outpatient pulmonary, CHIEF COMPLAINT: Shortness of breath  History of Present Illness:  87 year old male who PCCM saw in consult 12/11 for traumatic hemothorax s/p fall and subsequently performed right thoracentesis yielding 1400 ML fluid. He was d/c'd to home on 13th. Presented for post-hospital f/u on 12/20 in our clinic. Noted was doing well until 12/19 when felt weak and more SOB. CXR showed reoccurrence of right effusion so he was referred for direct admission  Patient in the room and his wife and daughter are very pleasant and wants something to sleep able to finish sentences he was more short of breath yesterday and then today the fall previous fall that resulted in all of this was related to postural changes and his HCTZ was stopped for dehydration and hyponatremia  Pertinent  Medical History  Bilateral carotid artery disease ([place]), Diabetes mellitus without complication [facility]), ED (erectile dysfunction), Hyperlipidemia, Hypertension, Lumbar disc disease, Mitral valve prolapse, Moderate mitral regurgitation (03/16/2014), and S/P mitral valve clip implantation (10/09/2019). DM type II Hypothyroidism  HL BPH Multiple falls Orthostasis Recent admits s/p fall c/b multiple rib fractures of the right ribs 6 through 11 w/ associated effusion. We performed right thoracentesis drawing 1400 ML bloody pleural fluid  Significant Hospital Events: Including procedures, antibiotic start and stop dates in addition to other pertinent events     Objective   There were no vitals taken for this visit.       No intake or output data in the 24 hours ending 05/03/21 1712 There were no vitals filed for this visit.  Examination: General:  NAD , pleasant and hungry , wants to eat .  Wants something to sleep Neuro:  WNL , AOX3 , EOMI , CN II-XII intact , UL , LL  strength is symmetrical and 5/5 HEENT:  atraumatic , no jaundice , dry mucous membranes  Cardiovascular:  Irregular irregular , ESM 2/6 in the aortic area  Lungs: No obvious ecchymosis decreased air entry on the right compared to the left no wheezing or crackles Abdomen:  Soft lax +BS , no tenderness . Musculoskeletal:  WNL , normal pulses  Skin:  No rash    Resolved Hospital Problem list    Assessment & Plan:  Recurrent right Pleural effusion.  -initially felt 2/2 traumatic hemothorax after a fall back on 12/11 Plan Admit  Place CT routine placement in the a.m. might need pigtail surgical depending on the fluid consistency patient is not on oxygen or respiratory distress now Send pleural analysis for infection  PRN analgesia   Patient is hyponatremic most likely hypovolemic hyponatremia we will start normal saline and recheck sodium   Medication to be restarted once verified    Labs   CBC: No results for input(s): WBC, NEUTROABS, HGB, HCT, MCV, PLT in the last 168 hours.  Basic Metabolic Panel: No results for input(s): NA, K, CL, CO2, GLUCOSE, BUN, CREATININE, CALCIUM, MG, PHOS in the last 168 hours. GFR: CrCl cannot be calculated (Unknown ideal weight.). No results for input(s): PROCALCITON, WBC, LATICACIDVEN in the last 168 hours.  Liver Function Tests: No results for input(s): AST, ALT, ALKPHOS, BILITOT, PROT, ALBUMIN in the last 168 hours. No results for input(s): LIPASE, AMYLASE in the last 168 hours. No results for input(s): AMMONIA in the last 168 hours.  ABG    Component Value Date/Time  PHART 7.412 10/07/2019 1115   PCO2ART 38.3 10/07/2019 1115   PO2ART 116 (H) 10/07/2019 1115   HCO3 23.3 04/24/2021 0747   TCO2 22 04/24/2021 0748   ACIDBASEDEF 1.0 04/24/2021 0747   O2SAT 36.0 04/24/2021 0747     Coagulation Profile: No results for input(s): INR, PROTIME in the last 168 hours.  Cardiac Enzymes: No results for input(s): CKTOTAL, CKMB, CKMBINDEX,  TROPONINI in the last 168 hours.  HbA1C: Hgb A1c MFr Bld  Date/Time Value Ref Range Status  10/07/2019 10:32 AM 6.5 (H) 4.8 - 5.6 % Final    Comment:    (NOTE) Pre diabetes:          5.7%-6.4% Diabetes:              >6.4% Glycemic control for   <7.0% adults with diabetes     CBG: No results for input(s): GLUCAP in the last 168 hours.  Review of Systems:   No fever chills rigors no nausea vomiting diarrhea passing out was related to the postural changes  Past Medical History:  He,  has a past medical history of Bilateral carotid artery disease ([place]), Diabetes mellitus without complication ([place]), ED (erectile dysfunction), Hyperlipidemia, Hypertension, Lumbar disc disease, Mitral valve prolapse, Moderate mitral regurgitation (03/16/2014), and S/P mitral valve clip implantation (10/09/2019).   Surgical History:   Past Surgical History:  Procedure Laterality Date   CARDIAC CATHETERIZATION     MITRAL VALVE REPAIR  10/09/2019   MITRAL VALVE REPAIR (N/A )   MITRAL VALVE REPAIR N/A 10/09/2019   Procedure: MITRAL VALVE REPAIR;  Surgeon: Sherren Mocha, MD;  Location: [facility];  Service: Cardiovascular;  Laterality: N/A;   RIGHT/LEFT HEART CATH AND CORONARY ANGIOGRAPHY N/A 09/16/2019   Procedure: RIGHT/LEFT HEART CATH AND CORONARY ANGIOGRAPHY;  Surgeon: Belva Crome, MD;  Location: [facility];  Service: Cardiovascular;  Laterality: N/A;   TEE WITHOUT CARDIOVERSION N/A 09/16/2019   Procedure: TRANSESOPHAGEAL ECHOCARDIOGRAM (TEE);  Surgeon: Lelon Perla, MD;  Location: [facility] ENDOS[facility];  Service: Cardiovascular;  Laterality: N/A;     Social History:   reports that he has never smoked. He has never used smokeless tobacco. He reports that he does not drink alcohol and does not use drugs.   Family History:  His family history includes Cancer in his mother; Dementia in his sister; Diabetes in his father and sister; Heart disease in his father and sister; Obesity in his  sister; Other in his father and sister; Stroke in his father.   Allergies Allergies  Allergen Reactions   Duloxetine Hcl Other (See Comments)    withdrawal     Home Medications  Prior to Admission medications   Medication Sig Start Date End Date Taking? Authorizing Provider  acetaminophen (TYLENOL) 500 MG tablet Take 1,000 mg by mouth every 6 (six) hours as needed (for pain.).    [provider]  amoxicillin (AMOXIL) 500 MG tablet Take 4 capsules (2,000 mg) one hour prior to all dental visits. 10/15/19   Eileen Stanford, PA-C  aspirin 81 MG EC tablet Take 81 mg by mouth daily.    [provider]  Cholecalciferol (VITAMIN D) 2000 UNITS tablet Take 2,000 Units by mouth daily at 12 noon.     [provider]  Cyanocobalamin (VITAMIN B-12) 2000 MCG TBCR 1 tablet    [provider]  finasteride (PROSCAR) 5 MG tablet Take 5 mg by mouth daily at 12 noon.     [provider]  HYDROcodone-acetaminophen (NORCO) 7.5-325 MG tablet Take 1 tablet by mouth daily as needed for moderate pain. 04/21/21   [provider]  hydrocortisone cream 1 % Apply 1 application topically as needed for itching.    [provider]  levothyroxine (SYNTHROID, LEVOTHROID) 75 MCG tablet Take 75 mcg by mouth daily before breakfast.    [provider]  metFORMIN (GLUCOPHAGE) 500 MG tablet Take 500 mg by mouth every evening.  04/23/16   [provider]  metoprolol tartrate (LOPRESSOR) 25 MG tablet Take 0.5 tablets (12.5 mg total) by mouth 2 (two) times daily. 04/26/21 05/26/21  Caren Griffins, MD  pravastatin (PRAVACHOL) 20 MG tablet Take 20 mg by mouth at bedtime.     [provider]  psyllium (METAMUCIL) 58.6 % packet Take 1 packet by mouth daily.    [provider]  ramipril (ALTACE) 5 MG capsule Take 5 mg by mouth daily.     [provider]  sodium chloride (OCEAN) 0.65 % SOLN nasal spray Place 1 spray into both  nostrils daily as needed for congestion.    [provider]  tamsulosin (FLOMAX) 0.4 MG CAPS capsule Take 0.4 mg by mouth daily at 12 noon.  04/13/18   [provider]  Thiamine HCl (VITAMIN B1 PO) Take 125 mcg by mouth daily.    [provider]  traMADol (ULTRAM) 50 MG tablet Take 50 mg by mouth 2 (two) times daily as needed. 04/18/21   [provider]

## 2021-05-04 ENCOUNTER — Encounter (HOSPITAL_COMMUNITY): Payer: Self-pay | Admitting: Internal Medicine

## 2021-05-04 ENCOUNTER — Telehealth: Payer: Self-pay | Admitting: Pulmonary Disease

## 2021-05-04 ENCOUNTER — Inpatient Hospital Stay (HOSPITAL_COMMUNITY): Payer: Medicare HMO

## 2021-05-04 DIAGNOSIS — S2241XS Multiple fractures of ribs, right side, sequela: Secondary | ICD-10-CM

## 2021-05-04 LAB — BASIC METABOLIC PANEL
Anion gap: 9 (ref 5–15)
Anion gap: 8 (ref 5–15)
BUN: 25 mg/dL — ABNORMAL HIGH (ref 8–23)
BUN: 23 mg/dL (ref 8–23)
CO2: 20 mmol/L — ABNORMAL LOW (ref 22–32)
CO2: 22 mmol/L (ref 22–32)
Calcium: 8.5 mg/dL — ABNORMAL LOW (ref 8.9–10.3)
Calcium: 8.5 mg/dL — ABNORMAL LOW (ref 8.9–10.3)
Chloride: 94 mmol/L — ABNORMAL LOW (ref 98–111)
Chloride: 94 mmol/L — ABNORMAL LOW (ref 98–111)
Creatinine, Ser: 1.05 mg/dL (ref 0.61–1.24)
Creatinine, Ser: 0.99 mg/dL (ref 0.61–1.24)
GFR, Estimated: >60 mL/min (ref >60)
GFR, Estimated: >60 mL/min (ref >60)
Glucose, Bld: 161 mg/dL — ABNORMAL HIGH (ref 70–99)
Glucose, Bld: 172 mg/dL — ABNORMAL HIGH (ref 70–99)
Potassium: 4.5 mmol/L (ref 3.5–5.1)
Potassium: 4.5 mmol/L (ref 3.5–5.1)
Sodium: 123 mmol/L — ABNORMAL LOW (ref 135–145)
Sodium: 124 mmol/L — ABNORMAL LOW (ref 135–145)

## 2021-05-04 LAB — LACTATE DEHYDROGENASE: LDH: 206 U/L — ABNORMAL HIGH (ref 98–192)

## 2021-05-04 LAB — PROTEIN, TOTAL: Total Protein: 6.3 g/dL — ABNORMAL LOW (ref 6.5–8.1)

## 2021-05-04 LAB — HEMATOCRIT: HCT: 33 % — ABNORMAL LOW (ref 39.0–52.0)

## 2021-05-04 MED ORDER — PSYLLIUM 95 % PO PACK
1.0000 | PACK | Freq: Every day | ORAL | Status: DC
  Administered 2021-05-04 – 2021-05-07 (×4): 1 via ORAL
  Filled 2021-05-04 (×7): qty 1

## 2021-05-04 MED ORDER — FINASTERIDE 5 MG PO TABS
5.0000 mg | ORAL_TABLET | Freq: Every day | ORAL | Status: DC
  Administered 2021-05-04 – 2021-05-09 (×6): 5 mg via ORAL
  Filled 2021-05-04 (×6): qty 1

## 2021-05-04 MED ORDER — FENTANYL CITRATE (PF) 100 MCG/2ML IJ SOLN
INTRAMUSCULAR | Status: AC
  Administered 2021-05-04: 14:00:00 50 ug
  Filled 2021-05-04: qty 2

## 2021-05-04 MED ORDER — ALPRAZOLAM 0.25 MG PO TABS
0.2500 mg | ORAL_TABLET | Freq: Two times a day (BID) | ORAL | Status: DC | PRN
  Administered 2021-05-04 – 2021-05-07 (×4): 0.25 mg via ORAL
  Filled 2021-05-04 (×4): qty 1

## 2021-05-04 MED ORDER — PRAVASTATIN SODIUM 20 MG PO TABS
20.0000 mg | ORAL_TABLET | Freq: Every day | ORAL | Status: DC
  Administered 2021-05-04 – 2021-05-08 (×5): 20 mg via ORAL
  Filled 2021-05-04 (×5): qty 1

## 2021-05-04 MED ORDER — SALINE SPRAY 0.65 % NA SOLN
1.0000 | Freq: Every day | NASAL | Status: DC | PRN
  Filled 2021-05-04: qty 44

## 2021-05-04 MED ORDER — LEVOTHYROXINE SODIUM 50 MCG PO TABS: 75.0000 ug | ORAL_TABLET | Freq: Every day | ORAL | Status: DC

## 2021-05-04 MED ORDER — ZOLPIDEM TARTRATE 5 MG PO TABS
5.0000 mg | ORAL_TABLET | Freq: Every evening | ORAL | Status: DC | PRN
  Administered 2021-05-07 – 2021-05-08 (×2): 5 mg via ORAL
  Filled 2021-05-04 (×2): qty 1

## 2021-05-04 MED ORDER — LEVOTHYROXINE SODIUM 50 MCG PO TABS
75.0000 ug | ORAL_TABLET | Freq: Every day | ORAL | Status: DC
  Administered 2021-05-05 – 2021-05-09 (×5): 75 ug via ORAL
  Filled 2021-05-04 (×5): qty 1

## 2021-05-04 MED ORDER — RAMIPRIL 5 MG PO CAPS
5.0000 mg | ORAL_CAPSULE | Freq: Every day | ORAL | Status: DC
  Administered 2021-05-04 – 2021-05-09 (×6): 5 mg via ORAL
  Filled 2021-05-04 (×6): qty 1

## 2021-05-04 MED ORDER — METOPROLOL TARTRATE 25 MG PO TABS
12.5000 mg | ORAL_TABLET | Freq: Two times a day (BID) | ORAL | Status: DC
  Administered 2021-05-04 – 2021-05-09 (×9): 12.5 mg via ORAL
  Filled 2021-05-04 (×10): qty 1

## 2021-05-04 MED ORDER — VITAMIN D 25 MCG (1000 UNIT) PO TABS
2000.0000 [IU] | ORAL_TABLET | Freq: Every day | ORAL | Status: DC
  Administered 2021-05-04 – 2021-05-09 (×6): 2000 [IU] via ORAL
  Filled 2021-05-04 (×6): qty 2

## 2021-05-04 MED ORDER — NALOXONE HCL 0.4 MG/ML IJ SOLN
INTRAMUSCULAR | Status: AC
  Filled 2021-05-04: qty 1

## 2021-05-04 MED ORDER — METFORMIN HCL 500 MG PO TABS
500.0000 mg | ORAL_TABLET | Freq: Every day | ORAL | Status: DC
  Administered 2021-05-04 – 2021-05-08 (×5): 500 mg via ORAL
  Filled 2021-05-04 (×5): qty 1

## 2021-05-04 MED ORDER — TAMSULOSIN HCL 0.4 MG PO CAPS
0.4000 mg | ORAL_CAPSULE | Freq: Every day | ORAL | Status: DC
  Administered 2021-05-04 – 2021-05-09 (×6): 0.4 mg via ORAL
  Filled 2021-05-04 (×6): qty 1

## 2021-05-04 NOTE — Consult Note (Addendum)
Chief Complaint: Patient was seen in consultation today for image guided right chest/pleural drain placement  Referring Physician(s): Byrum,R  Supervising Physician: Arne Cleveland  Patient Status: [facility] - In-pt  History of Present Illness: Adrian Olson is an 87 y.o. male with past medical history of carotid artery disease, diabetes, hyperlipidemia, hypertension, lumbar disc disease, mitral valve prolapse, mitral regurg with mitral valve clip implantation who was seen in consultation by pulmonology on 12/11 for traumatic hemothorax following fall at home.  He underwent thoracentesis on 12/12  by CCM yielding 1400 cc bloody fluid.  He was discharged home on the 13th and presented for follow-up to their clinic on 12/20.  At that time patient noticed more weakness and dyspnea.  CXR  revealed: BILATERAL pleural effusions and basilar atelectasis RIGHT greater than LEFT, significantly increased on RIGHT and mildly increased on LEFT since previous exam.   Small RIGHT apex pneumothorax, slightly increased.   Small loculated hydropneumothorax posterior mid RIGHT chest.  Patient was subsequently referred for direct admission.  Previous pleural fluid cultures were negative.  Cytology was negative.  COVID-19 test on 12/11 was negative.  Request now received from pulmonology for right pigtail pleural catheter placement.  Latest labs include creatinine normal, T15.8, INR 1.3, WBC normal, hemoglobin 11.6, platelets normal. He is afebrile.  Past Medical History:  Diagnosis Date   Bilateral carotid artery disease (HCC)    Severe Left and moderate Right   Diabetes mellitus without complication [facility])    ED (erectile dysfunction)    Hyperlipidemia    Hypertension    Lumbar disc disease    Mitral valve prolapse    moderate to severe MR, LVEF 55% Echo 2010   Moderate mitral regurgitation 03/16/2014   S/P mitral valve clip implantation 10/09/2019   s/p TEER using 2 MitraClip XTW devices, first  device positioned A2 P2, second device positioned A1 P1 by Dr. Burt Knack    Past Surgical History:  Procedure Laterality Date   CARDIAC CATHETERIZATION     MITRAL VALVE REPAIR  10/09/2019   MITRAL VALVE REPAIR (N/A )   MITRAL VALVE REPAIR N/A 10/09/2019   Procedure: MITRAL VALVE REPAIR;  Surgeon: Sherren Mocha, MD;  Location: [facility];  Service: Cardiovascular;  Laterality: N/A;   RIGHT/LEFT HEART CATH AND CORONARY ANGIOGRAPHY N/A 09/16/2019   Procedure: RIGHT/LEFT HEART CATH AND CORONARY ANGIOGRAPHY;  Surgeon: Belva Crome, MD;  Location: [facility];  Service: Cardiovascular;  Laterality: N/A;   TEE WITHOUT CARDIOVERSION N/A 09/16/2019   Procedure: TRANSESOPHAGEAL ECHOCARDIOGRAM (TEE);  Surgeon: Lelon Perla, MD;  Location: [facility];  Service: Cardiovascular;  Laterality: N/A;    Allergies: Duloxetine hcl  Medications: Prior to Admission medications   Medication Sig Start Date End Date Taking? Authorizing Provider  aspirin 81 MG EC tablet Take 81 mg by mouth daily.   Yes [provider]  Cholecalciferol (VITAMIN D) 2000 UNITS tablet Take 2,000 Units by mouth daily at 12 noon.    Yes [provider]  Cyanocobalamin (VITAMIN B-12) 2000 MCG TBCR 1 tablet   Yes [provider]  finasteride (PROSCAR) 5 MG tablet Take 5 mg by mouth daily at 12 noon.    Yes [provider]  levothyroxine (SYNTHROID, LEVOTHROID) 75 MCG tablet Take 75 mcg by mouth daily before breakfast.   Yes [provider]  metFORMIN (GLUCOPHAGE) 500 MG tablet Take 500 mg by mouth every evening.  04/23/16  Yes [provider]  metoprolol tartrate (LOPRESSOR) 25 MG tablet Take 0.5 tablets (12.5 mg total) by mouth 2 (two) times daily. 04/26/21 05/26/21 Yes Gherghe, Vella Redhead, MD  pravastatin (PRAVACHOL) 20 MG tablet Take 20 mg by mouth at bedtime.    Yes [provider]  psyllium (METAMUCIL) 58.6 % packet Take 1 packet by mouth daily.   Yes  [provider]  ramipril (ALTACE) 5 MG capsule Take 5 mg by mouth daily.    Yes [provider]  sodium chloride (OCEAN) 0.65 % SOLN nasal spray Place 1 spray into both nostrils daily as needed for congestion.   Yes [provider]  tamsulosin (FLOMAX) 0.4 MG CAPS capsule Take 0.4 mg by mouth daily at 12 noon.  04/13/18  Yes [provider]  Thiamine HCl (VITAMIN B1 PO) Take 125 mcg by mouth daily.   Yes [provider]  HYDROcodone-acetaminophen (NORCO) 7.5-325 MG tablet Take 1 tablet by mouth daily as needed for moderate pain. 04/21/21   [provider]     Family History  Problem Relation Age of Onset   Cancer Mother        ovarian   Diabetes Father    Other Father        enlarged prostate   Stroke Father    Heart disease Father    Heart disease Sister    Obesity Sister    Dementia Sister    Diabetes Sister    Other Sister        nornmal old age problems    Social History   Socioeconomic History   Marital status: Married    Spouse name: Not on file   Number of children: Not on file   Years of education: Not on file   Highest education level: Not on file  Occupational History   Not on file  Tobacco Use   Smoking status: Never   Smokeless tobacco: Never  Vaping Use   Vaping Use: Never used  Substance and Sexual Activity   Alcohol use: No    Alcohol/week: 0.0 standard drinks   Drug use: No   Sexual activity: Not on file  Other Topics Concern   Not on file  Social History Narrative   Not on file   Social Determinants of Health   Financial Resource Strain: Not on file  Food Insecurity: Not on file  Transportation Needs: Not on file  Physical Activity: Not on file  Stress: Not on file  Social Connections: Not on file      Review of Systems currently denies fever, headache, worsening chest pain, cough, abdominal pain, nausea, vomiting or bleeding.  He does have some dyspnea with exertion and back  pain  Vital Signs: BP (!) 146/91 (BP Location: Right Arm)    Pulse 73    Temp (!) 97.5 F (36.4 C) (Oral)    Resp 18    Ht 5\' 9"  (1.753 m)    Wt 149 lb 4 oz (67.7 kg)    SpO2 91%    BMI 22.04 kg/m   Physical Exam awake, alert.  Hard of hearing.  Chest with diminished breath bases, few crackles,R>L.  Heart with regular rate and rhythm.  Abdomen soft, positive bowel sounds, nontender.  No lower extremity edema.  Imaging: DG Chest 2 View  Result Date: 05/04/2021 CLINICAL DATA:  Pleural effusion EXAM: CHEST - 2 VIEW COMPARISON:  04/26/2021 FINDINGS: Upper normal heart size with pulmonary vascular congestion. Mediastinal contours normal. Atherosclerotic calcification aorta. Significant increase in RIGHT  pleural effusion since previous exam with increased RIGHT basilar atelectasis. Small loculated hydropneumothorax in posterior mid RIGHT chest with air-fluid level. Small LEFT pleural effusion and mild LEFT basilar atelectasis, slightly increased. No definite acute infiltrate. Persistent RIGHT apex pneumothorax, slightly increased. Osseous structures unremarkable. IMPRESSION: BILATERAL pleural effusions and basilar atelectasis RIGHT greater than LEFT, significantly increased on RIGHT and mildly increased on LEFT since previous exam. Small RIGHT apex pneumothorax, slightly increased. Small loculated hydropneumothorax posterior mid RIGHT chest. Critical Value/emergent results were called by telephone at the time of interpretation on 05/04/2021 at 11:06 am to provider Raquel Sarna RN at Dr. Erin Fulling, Who verbally acknowledged these results. Electronically Signed   By: Lavonia Dana M.D.   On: 05/04/2021 11:15   CT HEAD WO CONTRAST  Result Date: 04/24/2021 CLINICAL DATA:  87 year old male new onset seizure. Became unresponsive. EXAM: CT HEAD WITHOUT CONTRAST TECHNIQUE: Contiguous axial images were obtained from the base of the skull through the vertex without intravenous contrast. COMPARISON:  None. FINDINGS: Brain:  Cerebral volume is within normal limits for age. No midline shift, ventriculomegaly, mass effect, evidence of mass lesion, intracranial hemorrhage or evidence of cortically based acute infarction. Mild dystrophic calcifications bilateral basal ganglia. Gray-white matter differentiation within normal limits for age. No cortical encephalomalacia identified. Vascular: Calcified atherosclerosis at the skull base. No suspicious intracranial vascular hyperdensity. Skull: No fracture identified. Sinuses/Orbits: Visualized paranasal sinuses and mastoids are well aerated. Other: No discrete orbit or scalp soft tissue injury identified. IMPRESSION: Normal for age non contrast CT appearance of the brain. No acute traumatic injury identified. Electronically Signed   By: Genevie Ann M.D.   On: 04/24/2021 07:39   CT Angio Chest PE W and/or Wo Contrast  Result Date: 04/24/2021 CLINICAL DATA:  Possible pulmonary embolism. Syncope at home when getting up to go to the bathroom. EXAM: CT ANGIOGRAPHY CHEST CT ABDOMEN AND PELVIS WITH CONTRAST TECHNIQUE: Multidetector CT imaging of the chest was performed using the standard protocol during bolus administration of intravenous contrast. Multiplanar CT image reconstructions and MIPs were obtained to evaluate the vascular anatomy. Multidetector CT imaging of the abdomen and pelvis was performed using the standard protocol during bolus administration of intravenous contrast. CONTRAST:  176mL OMNIPAQUE IOHEXOL 350 MG/ML SOLN COMPARISON:  CT abdomen/pelvis 10/08/2008 FINDINGS: CTA CHEST FINDINGS Cardiovascular: Mild-to-moderate cardiomegaly. Prosthetic mitral valve noted. Mild calcified plaque over the left main and right coronary arteries. Ascending thoracic aorta measures approximately 3.6 cm in AP diameter. Calcified plaque over the descending thoracic aorta. Pulmonary arterial system is well opacified and demonstrates no evidence of emboli. Mediastinum/Nodes: No definite mediastinal or  hilar adenopathy. Remaining mediastinal structures are unremarkable. Lungs/Pleura: Moderate moderate to large right pleural fluid collection and small to moderate left pleural fluid collection with associated atelectatic change within the lung bases. No pneumothorax. Airways are unremarkable. Musculoskeletal: Numerous right posterior and lateral rib fractures involving ribs 6 through 11. Review of the MIP images confirms the above findings. CT ABDOMEN and PELVIS FINDINGS Hepatobiliary: Liver and biliary tree are normal. Tiny amount of adjacent pericholecystic fluid as the gallbladder is unremarkable. Pancreas: Normal. Spleen: Normal. Adrenals/Urinary Tract: Adrenal glands are normal. Kidneys are normal in size without focal mass or hydronephrosis. Ureters and bladder are normal. Stomach/Bowel: Stomach and small bowel are normal. Diverticulosis of the colon with mild fecal retention throughout the colon. Appendix not visualized. Vascular/Lymphatic: Mild-to-moderate calcified plaque over the abdominal aorta which is normal in caliber. No adenopathy. Reproductive: Mild prostatic enlargement with moderate pressure upon the bladder base.  Other: No significant free peritoneal fluid and no free peritoneal air. Small right inguinal hernia containing peritoneal fat and tiny amount of fluid. Musculoskeletal: Degenerative change of the spine. No acute fracture. Review of the MIP images confirms the above findings. IMPRESSION: 1. No evidence of pulmonary embolism. 2. Moderate to large right pleural fluid collection and small to moderate left pleural fluid collection with associated bibasilar atelectasis. No pneumothorax. Multiple associated mildly displaced and slightly comminuted fractures of posterolateral right ribs 6 through 11. 3. No acute findings in the abdomen/pelvis. 4. Colonic diverticulosis without active inflammation. 5. Mild prostatic enlargement with moderate impression upon the bladder base. 6. Small right  inguinal hernia containing peritoneal fat and tiny amount of fluid. 7. Aortic atherosclerosis. Atherosclerotic coronary artery disease. Aortic Atherosclerosis (ICD10-I70.0). Electronically Signed   By: Marin Olp M.D.   On: 04/24/2021 09:09   MR BRAIN WO CONTRAST  Result Date: 04/24/2021 CLINICAL DATA:  Stroke follow-up EXAM: MRI HEAD WITHOUT CONTRAST TECHNIQUE: Multiplanar, multiecho pulse sequences of the brain and surrounding structures were obtained without intravenous contrast. COMPARISON:  CT head 04/24/2021 FINDINGS: Brain: No acute infarction, hemorrhage, hydrocephalus, extra-axial collection or mass lesion. Minimal white matter changes. Mild cerebral atrophy. Vascular: Normal arterial flow voids at the skull base. Skull and upper cervical spine: No focal skeletal lesion. Sinuses/Orbits: Paranasal sinuses clear. Bilateral cataract extraction Other: None IMPRESSION: No acute abnormality. Electronically Signed   By: Franchot Gallo M.D.   On: 04/24/2021 17:47   CT ABDOMEN PELVIS W CONTRAST  Result Date: 04/24/2021 CLINICAL DATA:  Possible pulmonary embolism. Syncope at home when getting up to go to the bathroom. EXAM: CT ANGIOGRAPHY CHEST CT ABDOMEN AND PELVIS WITH CONTRAST TECHNIQUE: Multidetector CT imaging of the chest was performed using the standard protocol during bolus administration of intravenous contrast. Multiplanar CT image reconstructions and MIPs were obtained to evaluate the vascular anatomy. Multidetector CT imaging of the abdomen and pelvis was performed using the standard protocol during bolus administration of intravenous contrast. CONTRAST:  117mL OMNIPAQUE IOHEXOL 350 MG/ML SOLN COMPARISON:  CT abdomen/pelvis 10/08/2008 FINDINGS: CTA CHEST FINDINGS Cardiovascular: Mild-to-moderate cardiomegaly. Prosthetic mitral valve noted. Mild calcified plaque over the left main and right coronary arteries. Ascending thoracic aorta measures approximately 3.6 cm in AP diameter. Calcified  plaque over the descending thoracic aorta. Pulmonary arterial system is well opacified and demonstrates no evidence of emboli. Mediastinum/Nodes: No definite mediastinal or hilar adenopathy. Remaining mediastinal structures are unremarkable. Lungs/Pleura: Moderate moderate to large right pleural fluid collection and small to moderate left pleural fluid collection with associated atelectatic change within the lung bases. No pneumothorax. Airways are unremarkable. Musculoskeletal: Numerous right posterior and lateral rib fractures involving ribs 6 through 11. Review of the MIP images confirms the above findings. CT ABDOMEN and PELVIS FINDINGS Hepatobiliary: Liver and biliary tree are normal. Tiny amount of adjacent pericholecystic fluid as the gallbladder is unremarkable. Pancreas: Normal. Spleen: Normal. Adrenals/Urinary Tract: Adrenal glands are normal. Kidneys are normal in size without focal mass or hydronephrosis. Ureters and bladder are normal. Stomach/Bowel: Stomach and small bowel are normal. Diverticulosis of the colon with mild fecal retention throughout the colon. Appendix not visualized. Vascular/Lymphatic: Mild-to-moderate calcified plaque over the abdominal aorta which is normal in caliber. No adenopathy. Reproductive: Mild prostatic enlargement with moderate pressure upon the bladder base. Other: No significant free peritoneal fluid and no free peritoneal air. Small right inguinal hernia containing peritoneal fat and tiny amount of fluid. Musculoskeletal: Degenerative change of the spine. No acute fracture. Review of  the MIP images confirms the above findings. IMPRESSION: 1. No evidence of pulmonary embolism. 2. Moderate to large right pleural fluid collection and small to moderate left pleural fluid collection with associated bibasilar atelectasis. No pneumothorax. Multiple associated mildly displaced and slightly comminuted fractures of posterolateral right ribs 6 through 11. 3. No acute findings in  the abdomen/pelvis. 4. Colonic diverticulosis without active inflammation. 5. Mild prostatic enlargement with moderate impression upon the bladder base. 6. Small right inguinal hernia containing peritoneal fat and tiny amount of fluid. 7. Aortic atherosclerosis. Atherosclerotic coronary artery disease. Aortic Atherosclerosis (ICD10-I70.0). Electronically Signed   By: Marin Olp M.D.   On: 04/24/2021 09:09   DG CHEST PORT 1 VIEW  Result Date: 04/26/2021 CLINICAL DATA:  Shortness of breath, status post thoracentesis EXAM: PORTABLE CHEST 1 VIEW COMPARISON:  Previous studies including the examination of 04/25/2021 FINDINGS: Transverse diameter of heart is increased. There is prosthetic device in the region of mitral valve. Central pulmonary vessels are prominent. There are no signs of alveolar pulmonary edema. There is blunting of both lateral CP angles. Undisplaced fracture is seen in the posterolateral aspect of right ninth rib. Increased markings are seen in the lower lung fields with improvement in aeration in the left lower lung fields. There are no new focal pulmonary infiltrates. There is small right apical pneumothorax. IMPRESSION: Cardiomegaly. There are patchy infiltrates in both lower lung fields suggesting atelectasis/pneumonia with interval improvement in the aeration in the left lower lung fields. Small bilateral pleural effusions. There is minimal right apical pneumothorax. Electronically Signed   By: Elmer Picker M.D.   On: 04/26/2021 09:29   DG CHEST PORT 1 VIEW  Result Date: 04/25/2021 CLINICAL DATA:  Status post thoracentesis EXAM: PORTABLE CHEST 1 VIEW COMPARISON:  04/24/2021 chest CT angiogram FINDINGS: Stable cardiomediastinal silhouette with mild cardiomegaly. No pneumothorax. Residual small right pleural effusion, substantially decreased. Stable small left pleural effusion. No overt pulmonary edema. Patchy bibasilar lung opacities, decreased on the right and stable on the  left. Redemonstration of posterolateral right lower rib fracture. IMPRESSION: 1. No pneumothorax. Residual small right pleural effusion, substantially decreased. 2. Stable small left pleural effusion. 3. Patchy bibasilar lung opacities, decreased on the right and stable on the left, favor atelectasis. 4. Stable cardiomegaly without overt pulmonary edema. Electronically Signed   By: Ilona Sorrel M.D.   On: 04/25/2021 15:24   EEG adult  Result Date: 04/26/2021 Lora Havens, MD     04/26/2021 11:00 AM Patient Name: WILLET SCHLEIFER MRN: [id number] Epilepsy Attending: Lora Havens Referring Physician/Provider: Dr Marzetta Board Date: 04/26/2021 Duration: 23.23 mins Patient history: 87 year old male with an episode of unresponsiveness/syncope.  EEG evaluate for seizures. Level of alertness: Awake, drowsy AEDs during EEG study: None Technical aspects: This EEG study was done with scalp electrodes positioned according to the 10-20 International system of electrode placement. Electrical activity was acquired at a sampling rate of 500Hz  and reviewed with a high frequency filter of 70Hz  and a low frequency filter of 1Hz . EEG data were recorded continuously and digitally stored. Description: The posterior dominant rhythm consists of 8-9 Hz activity of moderate voltage (25-35 uV) seen predominantly in posterior head regions, symmetric and reactive to eye opening and eye closing. Drowsiness was characterized by attenuation of the posterior background rhythm. Hyperventilation and photic stimulation were not performed.   IMPRESSION: This study is within normal limits. No seizures or epileptiform discharges were seen throughout the recording. [place]   ECHOCARDIOGRAM COMPLETE  Result  Date: 04/26/2021    ECHOCARDIOGRAM REPORT   Patient Name:   ARSAL TAPPAN [facility] Date of Exam: 04/26/2021 Medical Rec #:  [id number]      Height:       69.0 in Accession #:    [id number]     Weight:       140.4 lb Date of Birth:   1931-04-24     BSA:          1.778 m Patient Age:    61 years       BP:           123/74 mmHg Patient Gender: M              HR:           58 bpm. Exam Location:  Inpatient Procedure: 2D Echo, 3D Echo, Cardiac Doppler and Color Doppler Indications:    R94.31 Abnormal EKG  History:        Patient has prior history of Echocardiogram examinations, most                 recent 12/01/2020. Abnormal ECG, Mitral Valve Prolapse and Mitral                 Valve Disease, Signs/Symptoms:Hypotension; Risk                 Factors:Hypertension and Dyslipidemia. Severe mitral                 regurgitation. Post mitra clip.                  Mitral Valve: Mitra-Clip valve is present in the mitral                 position. Procedure Date: 10/09/2019.  Sonographer:    Roseanna Rainbow RDCS Referring Phys: [id number] [place]  1. Left ventricular ejection fraction, by estimation, is 55 to 60%. Left ventricular ejection fraction by 3D volume is 56 %. The left ventricle has normal function. The left ventricle has no regional wall motion abnormalities. There is mild concentric left ventricular hypertrophy. Left ventricular diastolic function could not be evaluated. Elevated left atrial pressure.  2. Right ventricular systolic function is normal. The right ventricular size is normal. Tricuspid regurgitation signal is inadequate for assessing PA pressure.  3. Left atrial size was moderately dilated.  4. Right atrial size was moderately dilated.  5. Moderate pleural effusion in the left lateral region.  6. The mitral valve has been repaired/replaced. Moderate mitral valve regurgitation. The mean mitral valve gradient is 4.0 mmHg with average heart rate of 58 bpm. There is a Mitra-Clip present in the mitral position. Procedure Date: 10/09/2019.  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  8. The aortic valve is tricuspid. There is moderate calcification of the aortic valve. There is moderate thickening of the aortic valve. Aortic valve  regurgitation is mild.  9. The inferior vena cava is dilated in size with <50% respiratory variability, suggesting right atrial pressure of 15 mmHg. Comparison(s): No significant change from prior study. Prior images reviewed side by side. FINDINGS  Left Ventricle: Left ventricular ejection fraction, by estimation, is 55 to 60%. Left ventricular ejection fraction by 3D volume is 56 %. The left ventricle has normal function. The left ventricle has no regional wall motion abnormalities. The left ventricular internal cavity size was normal in size. There is mild concentric left ventricular hypertrophy. Left ventricular diastolic function could not be evaluated due to  mitral valve repair. Left ventricular diastolic function could not be evaluated.  Elevated left atrial pressure. Right Ventricle: The right ventricular size is normal. No increase in right ventricular wall thickness. Right ventricular systolic function is normal. Tricuspid regurgitation signal is inadequate for assessing PA pressure. Left Atrium: Left atrial size was moderately dilated. Right Atrium: Right atrial size was moderately dilated. Pericardium: There is no evidence of pericardial effusion. Mitral Valve: The mitral valve has been repaired/replaced. There is moderate thickening of the mitral valve leaflet(s). Moderate mitral valve regurgitation, with eccentric anteriorly directed jet. There is a Mitra-Clip present in the mitral position. Procedure Date: 10/09/2019. MV peak gradient, 20.1 mmHg. The mean mitral valve gradient is 4.0 mmHg with average heart rate of 58 bpm. Tricuspid Valve: The tricuspid valve is myxomatous. Tricuspid valve regurgitation is trivial. Aortic Valve: The aortic valve is tricuspid. There is moderate calcification of the aortic valve. There is moderate thickening of the aortic valve. Aortic valve regurgitation is mild. Aortic regurgitation PHT measures 890 msec. Pulmonic Valve: The pulmonic valve was grossly normal. Pulmonic  valve regurgitation is not visualized. Aorta: The aortic root and ascending aorta are structurally normal, with no evidence of dilitation. Venous: The inferior vena cava is dilated in size with less than 50% respiratory variability, suggesting right atrial pressure of 15 mmHg. IAS/Shunts: No atrial level shunt detected by color flow Doppler. Additional Comments: There is a moderate pleural effusion in the left lateral region.  LEFT VENTRICLE PLAX 2D LVIDd:         5.00 cm         Diastology LVIDs:         3.40 cm         LV e' medial:    5.10 cm/s LV PW:         1.40 cm         LV E/e' medial:  33.7 LV IVS:        1.20 cm         LV e' lateral:   4.80 cm/s LVOT diam:     2.10 cm         LV E/e' lateral: 35.8 LV SV:         54 LV SV Index:   30 LVOT Area:     3.46 cm        3D Volume EF                                LV 3D EF:    Left                                             ventricul LV Volumes (MOD)                            ar LV vol d, MOD    127.0 ml                   ejection A2C:                                        fraction LV vol d, MOD    134.0 ml  by 3D A4C:                                        volume is LV vol s, MOD    64.3 ml                    56 %. A2C: LV vol s, MOD    51.5 ml A4C:                           3D Volume EF: LV SV MOD A2C:   62.7 ml       3D EF:        56 % LV SV MOD A4C:   134.0 ml      LV EDV:       193 ml LV SV MOD BP:    78.8 ml       LV ESV:       84 ml                                LV SV:        108 ml RIGHT VENTRICLE             IVC RV S prime:     10.30 cm/s  IVC diam: 2.40 cm TAPSE (M-mode): 2.2 cm LEFT ATRIUM             Index        RIGHT ATRIUM           Index LA diam:        4.80 cm 2.70 cm/m   RA Area:     23.10 cm LA Vol (A2C):   93.4 ml 52.54 ml/m  RA Volume:   71.70 ml  40.33 ml/m LA Vol (A4C):   76.0 ml 42.75 ml/m LA Biplane Vol: 89.5 ml 50.34 ml/m  AORTIC VALVE LVOT Vmax:   76.40 cm/s LVOT Vmean:  52.000 cm/s LVOT VTI:    0.155 m AI  PHT:      890 msec  AORTA Ao Root diam: 3.20 cm Ao Asc diam:  3.60 cm MITRAL VALVE MV Area (PHT): 1.78 cm       SHUNTS MV Area VTI:   0.87 cm       Systemic VTI:  0.16 m MV Peak grad:  20.1 mmHg      Systemic Diam: 2.10 cm MV Mean grad:  4.0 mmHg MV Vmax:       2.24 m/s MV Vmean:      85.1 cm/s MV Decel Time: 426 msec MR Peak grad:    64.3 mmHg MR Mean grad:    34.0 mmHg MR Vmax:         401.00 cm/s MR Vmean:        264.0 cm/s MR PISA:         18.16 cm MR PISA Eff ROA: 153 mm MR PISA Radius:  1.70 cm MV E velocity: 172.00 cm/s MV A velocity: 65.00 cm/s MV E/A ratio:  2.65 Mihai Croitoru MD Electronically signed by Sanda Klein MD Signature Date/Time: 04/26/2021/2:53:41 PM    Final    LONG TERM MONITOR (3-14 DAYS)  Result Date: 04/26/2021  Basic underlying rhythm is NSR  60 Self-terminating SVT< 165 bpm with longest 18 seconds and  fastest lasting only 4 beats.  Frequent PVC's with burden 13.7%  One 6 beat non sustained VT at 148 bpm  No atrial fibrillation  Patch Wear Time:  2 days and 23 hours (2022-12-01T15:16:48-0500 to 2022-12-04T15:12:06-498) Patient had a min HR of 55 bpm, max HR of 164 bpm, and avg HR of 74 bpm. Predominant underlying rhythm was Sinus Rhythm. 1 run of Ventricular Tachycardia occurred lasting 6 beats with a max rate of 148 bpm (avg 108 bpm). 60 Supraventricular Tachycardia runs occurred, the run with the fastest interval lasting 4 beats with a max rate of 164 bpm, the longest lasting 18.1 secs with an avg rate of 103 bpm. Isolated SVEs were occasional (1.7%, 5113), SVE Couplets were rare (<1.0%, 759), and SVE Triplets were  rare (<1.0%, 188). Isolated VEs were frequent (13.7%, 41885), VE Couplets were occasional (1.3%, 1999), and VE Triplets were rare (<1.0%, 88). Ventricular Bigeminy and Trigeminy were present.    Labs:  CBC: Recent Labs    04/24/21 0555 04/24/21 0747 04/25/21 0327 04/25/21 1203 04/26/21 0016 04/26/21 0425 04/26/21 0752 05/03/21 2141  WBC 8.2   --  8.0  --   --   --   --  8.5  HGB 11.6*   < > 11.7*   < > 10.9* 11.2* 11.2* 11.6*  HCT 34.8*   < > 34.0*   < > 32.0* 32.7* 33.2* 34.7*  PLT 247  --  251  --   --   --   --  285   < > = values in this interval not displayed.    COAGS: Recent Labs    05/03/21 2141  INR 1.3*    BMP: Recent Labs    05/03/21 2141 05/03/21 2329 05/04/21 0415 05/04/21 1029  NA 122* 124* 123* 124*  K 5.0 4.8 4.5 4.5  CL 92* 94* 94* 94*  CO2 22 21* 20* 22  GLUCOSE 159* 151* 161* 172*  BUN 26* 25* 25* 23  CALCIUM 9.0 9.3 8.5* 8.5*  CREATININE 1.27* 1.01 1.05 0.99  GFRNONAA 54* >60 >60 >60    LIVER FUNCTION TESTS: Recent Labs    04/24/21 0555 05/03/21 2141  BILITOT 0.7 0.6  AST 114* 46*  ALT 110* 50*  ALKPHOS 116 101  PROT 6.5 6.7  ALBUMIN 3.5 3.4*    TUMOR MARKERS: No results for input(s): AFPTM, CEA, CA199, CHROMGRNA in the last 8760 hours.  Assessment and Plan: 87 y.o. male with past medical history of carotid artery disease, diabetes, hyperlipidemia, hypertension, lumbar disc disease, mitral valve prolapse, mitral regurg with mitral valve clip implantation who was seen in consultation by pulmonology on 12/11 for traumatic hemothorax following fall at home.  He underwent thoracentesis on 12/12  by CCM yielding 1400 cc bloody fluid.  He was discharged home on the 13th and presented for follow-up to their clinic on 12/20.  At that time patient noticed more weakness and dyspnea.  CXR  revealed: BILATERAL pleural effusions and basilar atelectasis RIGHT greater than LEFT, significantly increased on RIGHT and mildly increased on LEFT since previous exam.   Small RIGHT apex pneumothorax, slightly increased.   Small loculated hydropneumothorax posterior mid RIGHT chest.  Patient was subsequently referred for direct admission.  Previous pleural fluid cultures were negative.  Cytology was negative.  COVID-19 test on 12/11 was negative.  Request now received from pulmonology for right pigtail  pleural catheter placement.  Latest labs include creatinine normal, T15.8, INR 1.3, WBC normal, hemoglobin 11.6, platelets normal. He is  afebrile.  Imaging studies have been reviewed by Dr. Vernard Gambles and case discussed with Dr. Lamonte Sakai.  Details/risks of procedure, including but not limited to, internal bleeding, infection, pneumothorax, injury to adjacent structures discussed with patient and family with their understanding and consent.  Patient will receive IV fentanyl only today for case secondary to drinking coffee with cream around 1130 am today.   Thank you for this interesting consult.  I greatly enjoyed meeting FINLEY CHEVEZ and look forward to participating in their care.  A copy of this report was sent to the requesting provider on this date.  Electronically Signed: D. Rowe Robert, PA-C 05/04/2021, 12:21 PM   I spent a total of   25 minutes  in face to face in clinical consultation, greater than 50% of which was counseling/coordinating care for image guided right chest/pleural drain placement

## 2021-05-04 NOTE — Telephone Encounter (Signed)
Dr. Thornton Papas called the office about pt. Per Dr. Thornton Papas, there has been an increase in the left pleural effusion and also the pneumothorax has also increased (still small in size but has gotten bigger). Believe Dr. Thornton Papas also stated that there was a hydropneumothorax.  Results should be avail for view very soon per Dr. Thornton Papas. Routing to Dr. Erin Fulling.

## 2021-05-04 NOTE — Telephone Encounter (Signed)
Called and spoke with Adrian Olson letting her know the info stated by JD and she stated that pt has already had a procedure done having drain in place. Nothing further needed.

## 2021-05-04 NOTE — Procedures (Signed)
°  Procedure: CT guided R chest tube placement 20f EBL:   minimal Complications:  none immediate  See full dictation in [facility].  Dillard Cannon MD Main # [phone] Pager  [phone] Mobile [phone]

## 2021-05-04 NOTE — Progress Notes (Addendum)
NAME:  Adrian Olson, MRN:  [id number], DOB:  02-05-1931, LOS: 1 ADMISSION DATE:  05/03/2021, CONSULTATION DATE:  12/20 REFERRING MD: Dr. Erin Fulling, CHIEF COMPLAINT: Shortness of breath  History of Present Illness:  87 y/o M who presented 12/20 with reports of increasing shortness of breath in the setting of rib fractures and pleural effusion.   The patient was recently seen by PCCM in hospital 12/11 for traumatic hemothorax s/p fall and subsequently performed right thoracentesis yielding 1400 ML fluid. He was d/c'd to home on 13th. He was seen for post-hospital f/u on 12/20 in our clinic. He reported he was doing well until 12/19 when felt weak and more SOB. CXR showed reoccurrence of right effusion so he was referred for direct admission. Of note, his HCTZ was stopped during last admission due to hyponatremia, dehydration and dizziness.   Pertinent  Medical History  Bilateral carotid artery disease ([place]), Diabetes mellitus without complication [facility]), ED (erectile dysfunction), Hyperlipidemia, Hypertension, Lumbar disc disease, Mitral valve prolapse, Moderate mitral regurgitation (03/16/2014), and S/P mitral valve clip implantation (10/09/2019). DM type II Hypothyroidism  HL BPH Multiple falls Orthostasis Recent admits s/p fall c/b multiple rib fractures of the right ribs 6 through 11 w/ associated effusion. We performed right thoracentesis drawing 1400 ML bloody pleural fluid   Significant Hospital Events: Including procedures, antibiotic start and stop dates in addition to other pertinent events   12/20 Admit with SOB, recurrent pleural effusion   Objective   Blood pressure (!) 155/80, pulse 97, temperature 98.1 F (36.7 C), temperature source Oral, resp. rate 20, height 5\' 9"  (1.753 m), weight 67.7 kg, SpO2 91 %.        Intake/Output Summary (Last 24 hours) at 05/04/2021 1301 Last data filed at 05/04/2021 1200 Gross per 24 hour  Intake 324.3 ml  Output 800 ml  Net -475.7 ml    Filed Weights   05/03/21 2150  Weight: 67.7 kg    Examination: General: elderly adult male sitting up in bed in NAD, family at bedside   HEENT: MM pink/moist, good dentition, anicteric  Neuro: AAOx4, speech clear, MAE PSY: reports anxiety CV: s1s2 RRR, 2/6 SEM RSB / 2nd ICS PULM: non-labored at rest, diminished breath sounds on right GI: soft, bsx4 active  Extremities: warm/dry, no edema  Skin: no rashes or lesions  Resolved Hospital Problem list     Assessment & Plan:   Recurrent Right Pleural Effusion  Recent Fall with Rib Fractures   Transudative on initial fluid from prior admission.  Negative Cytology.   -plan for IR guided small bore chest tube placement given rib fractures, appreciate assistance with patient care -evacuate pleural space -follow pleural drainage  -follow daily CXR while CT in place -repeat pleural studies > protein, LDH, C&S, cytology, cell count with diff.  Will ask for pleural HCT but not sure they can/will run in lab -pulmonary hygiene - IS, mobilize. Ok for patient to walk with chest tube in place -ultram PRN pain   Hyponatremia  -follow trend  -recently taken off HCTZ -continue NS   HTN  HLD  -continue altace, lopressor -continue pravachol   Hypothyroidism  -continue synthroid   BPH  -continue flomax, proscar   Anxiety / Insomnia  -xanax 0.25 BID PRN  -ambien 5mg  QHS PRN sleep      Noe Gens, MSN, APRN, NP-C, AGACNP-BC [facility] 05/04/2021, 1:01 PM   Please see [email or web address] for pager details.   From 7A-7P if no response, please  call [phone] After hours, please call ELink [phone]

## 2021-05-04 NOTE — Telephone Encounter (Signed)
Spoke with the pt's daughter, [name]  She states that pt has been admitted to [facility] at around 9 pm 05/03/21  She states that the nurse this morning advised her that we need to put in orders for him to have his daily meds  She also wants to know when he should expect thora to be done  Please advise, thanks

## 2021-05-05 ENCOUNTER — Inpatient Hospital Stay (HOSPITAL_COMMUNITY): Payer: Medicare HMO

## 2021-05-05 LAB — PROTEIN, PLEURAL OR PERITONEAL FLUID: Total protein, fluid: <3 g/dL

## 2021-05-05 LAB — CBC
HCT: 34.3 % — ABNORMAL LOW (ref 39.0–52.0)
Hemoglobin: 11.7 g/dL — ABNORMAL LOW (ref 13.0–17.0)
MCH: 32.1 pg (ref 26.0–34.0)
MCHC: 34.1 g/dL (ref 30.0–36.0)
MCV: 94 fL (ref 80.0–100.0)
Platelets: 319 10*3/uL (ref 150–400)
RBC: 3.65 MIL/uL — ABNORMAL LOW (ref 4.22–5.81)
RDW: 13.6 % (ref 11.5–15.5)
WBC: 8.4 10*3/uL (ref 4.0–10.5)
nRBC: 0 % (ref 0.0–0.2)

## 2021-05-05 LAB — LACTATE DEHYDROGENASE, PLEURAL OR PERITONEAL FLUID: LD, Fluid: 221 U/L — ABNORMAL HIGH (ref 3–23)

## 2021-05-05 LAB — BASIC METABOLIC PANEL
Anion gap: 7 (ref 5–15)
BUN: 17 mg/dL (ref 8–23)
CO2: 20 mmol/L — ABNORMAL LOW (ref 22–32)
Calcium: 8 mg/dL — ABNORMAL LOW (ref 8.9–10.3)
Chloride: 99 mmol/L (ref 98–111)
Creatinine, Ser: 0.91 mg/dL (ref 0.61–1.24)
GFR, Estimated: >60 mL/min (ref >60)
Glucose, Bld: 157 mg/dL — ABNORMAL HIGH (ref 70–99)
Potassium: 3.9 mmol/L (ref 3.5–5.1)
Sodium: 126 mmol/L — ABNORMAL LOW (ref 135–145)

## 2021-05-05 LAB — BODY FLUID CELL COUNT WITH DIFFERENTIAL
Eos, Fluid: 12 %
Lymphs, Fluid: 61 %
Monocyte-Macrophage-Serous Fluid: 12 % — ABNORMAL LOW (ref 50–90)
Neutrophil Count, Fluid: 15 % (ref 0–25)
Total Nucleated Cell Count, Fluid: 734 cu mm (ref 0–1000)

## 2021-05-05 MED ORDER — VENLAFAXINE HCL ER 75 MG PO CP24
75.0000 mg | ORAL_CAPSULE | Freq: Every day | ORAL | Status: DC
  Administered 2021-05-06 – 2021-05-09 (×4): 75 mg via ORAL
  Filled 2021-05-05 (×4): qty 1

## 2021-05-05 NOTE — Progress Notes (Addendum)
NAME:  Adrian Olson, MRN:  [id number], DOB:  [birth year], LOS: 2 ADMISSION DATE:  05/03/2021, CONSULTATION DATE:  12/20 REFERRING MD: Dr. Erin Fulling, CHIEF COMPLAINT: Shortness of breath  History of Present Illness:  87 y/o M who presented 12/20 with reports of increasing shortness of breath in the setting of rib fractures and pleural effusion.   The patient was recently seen by PCCM in hospital 12/11 for traumatic hemothorax s/p fall and subsequently performed right thoracentesis yielding 1400 ML fluid. He was d/c'd to home on 13th. He was seen for post-hospital f/u on 12/20 in our clinic. He reported he was doing well until 12/19 when felt weak and more SOB. CXR showed reoccurrence of right effusion so he was referred for direct admission. Of note, his HCTZ was stopped during last admission due to hyponatremia, dehydration and dizziness.   Pertinent  Medical History  Bilateral carotid artery disease ([place]), Diabetes mellitus without complication [facility]), ED (erectile dysfunction), Hyperlipidemia, Hypertension, Lumbar disc disease, Mitral valve prolapse, Moderate mitral regurgitation (03/16/2014), and S/P mitral valve clip implantation (10/09/2019). DM type II Hypothyroidism  HL BPH Multiple falls Orthostasis Recent admits s/p fall c/b multiple rib fractures of the right ribs 6 through 11 w/ associated effusion. We performed right thoracentesis drawing 1400 ML bloody pleural fluid   Significant Hospital Events: Including procedures, antibiotic start and stop dates in addition to other pertinent events   12/20 Admit with SOB, recurrent pleural effusion  12/21 pigtail by IR 12/22 interval improvement on CXR. Pleural labs still pending   Objective   Blood pressure (!) 142/76, pulse 67, temperature 97.7 F (36.5 C), temperature source Oral, resp. rate 18, height 5\' 9"  (1.753 m), weight 67.7 kg, SpO2 96 %.        Intake/Output Summary (Last 24 hours) at 05/05/2021 1338 Last data filed at  05/05/2021 1204 Gross per 24 hour  Intake 2586.51 ml  Output 3560 ml  Net -973.49 ml   Filed Weights   05/03/21 2150  Weight: 67.7 kg    Examination: General: Frail elderly M supine in bed NAD  HEENT: NCAT pink mm anicteric sclera  Neuro: AAO following commands  CV: reg. S1s2 cap refill brisk  PULM: even and unlabored. Diminished R sided sounds. R pigtail chest tube  GI: thin ndnt  Extremities: no acute deformity. No edema. No cyanosis or clubbing  Skin: pale c/d/w   Resolved Hospital Problem list     Assessment & Plan:   Recurrent Right sided pleural effusion Recent fall with Rib fx  Transudative on initial fluid from prior admission w Negative Cytology.   Pleural fluid from 12/21 does not appear bloody. Query r/t underlying volume status & CV status.  P -IR placed pigtail 12/21 -follow output -PRN CXR  -repeat pleural studies pending > protein, LDH, C&S, cytology, cell count with diff.  Will ask for pleural HCT but not sure they can/will run in lab -ultram PRN pain -pulm hygiene, mobility   Hyponatremia of unknown chronicity -possibly chronic -- all 2022 values have been in 120s range. In 2021 ranged 132-137 -was taken off of HCTZ recently  P -follow trend  -encourage PO intake   MVR s/p mitraclip  HTN HLD -continue altace, lopressor -continue pravachol   Hypothyroidism  -cont synthroid   DM2 -Metformin   BPH  -cont flomax, proscar   Anxiety, insomnia  Depression -xanax 0.25 BID PRN for anxiety  -ambien 5mg  QHS PRN sleep  -Venlafaxine ER 75mg  qD   Physical deconditioning Frequent  falls -sounds like unsteady at home with multiple falls, including fall in 11/22 when pt broke numerous ribs.  -progressive deconditioning since this trauma P -PT/OT      Eliseo Gum MSN, AGACNP-BC [place] for pager  05/05/2021, 1:38 PM

## 2021-05-05 NOTE — TOC Progression Note (Signed)
Transition of Care [facility]) - Progression Note    Patient Details  Name: Adrian Olson MRN: [id number] Date of Birth: 09-16-1930  Transition of Care [facility]) CM/SW Contact  Purcell Mouton, RN Phone Number: 05/05/2021, 4:07 PM  Clinical Narrative:    Pt from home with spouse. Not stable for discharge. TOC will continue to follow.    Expected Discharge Plan: [place] Barriers to Discharge: No Barriers Identified  Expected Discharge Plan and Services Expected Discharge Plan: [place] arrangements for the past 2 months: Single Family Home                                       Social Determinants of Health (SDOH) Interventions    Readmission Risk Interventions No flowsheet data found.

## 2021-05-05 NOTE — Progress Notes (Signed)
Referring Physician(s): Dr. Lamonte Sakai   Supervising Physician: Ruthann Cancer  Patient Status:  [facility] - In-pt  Chief Complaint: Bilateral pleural effusions, right > left s/p right chest tube placement 05/04/21 by Dr. Vernard Gambles.   Subjective: Patient sitting on the edge of the bed working with the nurse tech to transfer to a chair. States he feels better and is breathing easier.   Allergies: Duloxetine hcl  Medications: Prior to Admission medications   Medication Sig Start Date End Date Taking? Authorizing Provider  aspirin 81 MG EC tablet Take 81 mg by mouth daily.   Yes [provider]  Cholecalciferol (VITAMIN D) 2000 UNITS tablet Take 2,000 Units by mouth daily at 12 noon.    Yes [provider]  Cyanocobalamin (VITAMIN B-12) 2000 MCG TBCR 1 tablet   Yes [provider]  finasteride (PROSCAR) 5 MG tablet Take 5 mg by mouth daily at 12 noon.    Yes [provider]  levothyroxine (SYNTHROID, LEVOTHROID) 75 MCG tablet Take 75 mcg by mouth daily before breakfast.   Yes [provider]  metFORMIN (GLUCOPHAGE) 500 MG tablet Take 500 mg by mouth every evening.  04/23/16  Yes [provider]  metoprolol tartrate (LOPRESSOR) 25 MG tablet Take 0.5 tablets (12.5 mg total) by mouth 2 (two) times daily. 04/26/21 05/26/21 Yes Gherghe, Vella Redhead, MD  pravastatin (PRAVACHOL) 20 MG tablet Take 20 mg by mouth at bedtime.    Yes [provider]  psyllium (METAMUCIL) 58.6 % packet Take 1 packet by mouth daily.   Yes [provider]  ramipril (ALTACE) 5 MG capsule Take 5 mg by mouth daily.    Yes [provider]  sodium chloride (OCEAN) 0.65 % SOLN nasal spray Place 1 spray into both nostrils daily as needed for congestion.   Yes [provider]  tamsulosin (FLOMAX) 0.4 MG CAPS capsule Take 0.4 mg by mouth daily at 12 noon.  04/13/18  Yes [provider]  Thiamine HCl (VITAMIN B1 PO) Take 125 mcg by mouth daily.    Yes [provider]  HYDROcodone-acetaminophen (NORCO) 7.5-325 MG tablet Take 1 tablet by mouth daily as needed for moderate pain. 04/21/21   [provider]     Vital Signs: BP 123/69 (BP Location: Left Arm)    Pulse 65    Temp 98.3 F (36.8 C) (Oral)    Resp 15    Ht 5\' 9"  (1.753 m)    Wt 149 lb 4 oz (67.7 kg)    SpO2 98%    BMI 22.04 kg/m   Physical Exam Constitutional:      General: He is not in acute distress.    Appearance: He is not ill-appearing.  Pulmonary:     Effort: Pulmonary effort is normal.     Comments: Right chest tube. Dressing is clean and dry. No air leak. Serous fluid in pleurevac.  Neurological:     Mental Status: He is alert and oriented to person, place, and time.    Imaging: DG Chest 2 View  Result Date: 05/04/2021 CLINICAL DATA:  Pleural effusion EXAM: CHEST - 2 VIEW COMPARISON:  04/26/2021 FINDINGS: Upper normal heart size with pulmonary vascular congestion. Mediastinal contours normal. Atherosclerotic calcification aorta. Significant increase in RIGHT pleural effusion since previous exam with increased RIGHT basilar atelectasis. Small loculated hydropneumothorax in posterior mid RIGHT chest with air-fluid level. Small LEFT pleural effusion and mild LEFT basilar atelectasis, slightly increased. No definite acute infiltrate. Persistent RIGHT apex pneumothorax, slightly  increased. Osseous structures unremarkable. IMPRESSION: BILATERAL pleural effusions and basilar atelectasis RIGHT greater than LEFT, significantly increased on RIGHT and mildly increased on LEFT since previous exam. Small RIGHT apex pneumothorax, slightly increased. Small loculated hydropneumothorax posterior mid RIGHT chest. Critical Value/emergent results were called by telephone at the time of interpretation on 05/04/2021 at 11:06 am to provider Raquel Sarna RN at Dr. Erin Fulling, Who verbally acknowledged these results. Electronically Signed   By: Lavonia Dana M.D.   On: 05/04/2021 11:15    DG CHEST PORT 1 VIEW  Result Date: 05/05/2021 CLINICAL DATA:  Pleural effusion. EXAM: PORTABLE CHEST 1 VIEW COMPARISON:  Radiographs 05/03/2021 and 04/26/2021.  CT 04/24/2021. FINDINGS: 1106 hours. Two views are submitted. There is a small caliber pigtail pleural catheter drain on the right with interval marked decrease in size of the right pleural effusion. Small residual right apical pneumothorax is unchanged. There is a persistent small left pleural effusion and bibasilar airspace opacities. The heart size and mediastinal contours are stable. Mitral valve prosthesis noted. IMPRESSION: 1. Interval marked decrease in size of right pleural effusion following right pleural drain catheter placement. Residual small right apical pneumothorax is unchanged. 2. Persistent small left pleural effusion and bibasilar airspace opacities. Electronically Signed   By: Richardean Sale M.D.   On: 05/05/2021 13:40   CT IMAGE GUIDED DRAINAGE BY PERCUTANEOUS CATHETER  Result Date: 05/05/2021 INDICATION: Recurrent pleural effusion EXAM: CT-GUIDED CHEST TUBE PLACEMENT MEDICATIONS: No periprocedural antibiotics were indicated ANESTHESIA/SEDATION: Intravenous Fentanyl 38mcg administered for pain control during continuous monitoring of the patient's level of consciousness and physiological / cardiorespiratory status by the radiology RN. COMPLICATIONS: None immediate. PROCEDURE: Informed written consent was obtained from the patient after a thorough discussion of the procedural risks, benefits and alternatives. All questions were addressed. Maximal Sterile Barrier Technique was utilized including caps, mask, sterile gowns, sterile gloves, sterile drape, hand hygiene and skin antiseptic. A timeout was performed prior to the initiation of the procedure. Select scans through the thorax obtained. Appropriate skin entry site was determined and marked. Region was prepped with chlorhexidine, draped in usual sterile fashion, infiltrated  locally with 1% lidocaine. Percutaneous entry needle was advanced into the pleural space. Thin fluid could be aspirated. Amplatz guidewire advanced easily. Tract dilated to facilitate placement of a 16 French pigtail drain catheter. Catheter was placed to Pleur-Evac suction device. Confirmatory CT demonstrated good catheter position. The catheter was secured externally with 0 Prolene suture and StatLock and a sterile dressing applied. The patient tolerated the procedure well. IMPRESSION: 1. Technically successful CT-guided right chest tube placement. Electronically Signed   By: Lucrezia Europe M.D.   On: 05/05/2021 07:43    Labs:  CBC: Recent Labs    04/24/21 0555 04/24/21 0747 04/25/21 0327 04/25/21 1203 04/26/21 0425 04/26/21 0752 05/03/21 2141 05/04/21 1331 05/05/21 0343  WBC 8.2  --  8.0  --   --   --  8.5  --  8.4  HGB 11.6*   < > 11.7*   < > 11.2* 11.2* 11.6*  --  11.7*  HCT 34.8*   < > 34.0*   < > 32.7* 33.2* 34.7* 33.0* 34.3*  PLT 247  --  251  --   --   --  285  --  319   < > = values in this interval not displayed.    COAGS: Recent Labs    05/03/21 2141  INR 1.3*    BMP: Recent Labs    05/03/21 2329 05/04/21 0415 05/04/21 1029  05/05/21 0343  NA 124* 123* 124* 126*  K 4.8 4.5 4.5 3.9  CL 94* 94* 94* 99  CO2 21* 20* 22 20*  GLUCOSE 151* 161* 172* 157*  BUN 25* 25* 23 17  CALCIUM 9.3 8.5* 8.5* 8.0*  CREATININE 1.01 1.05 0.99 0.91  GFRNONAA >60 >60 >60 >60    LIVER FUNCTION TESTS: Recent Labs    04/24/21 0555 05/03/21 2141 05/04/21 1331  BILITOT 0.7 0.6  --   AST 114* 46*  --   ALT 110* 50*  --   ALKPHOS 116 101  --   PROT 6.5 6.7 6.3*  ALBUMIN 3.5 3.4*  --     Assessment and Plan:  Drain Location: Right chest  Size: 16 Fr Date of placement: 05/04/21 Currently to: Pleur Evac 24 hour output: 2810 ml   Interval imaging/drain manipulation: CXR 05/05/21: Marked decrease in size of right pleural effusion; residual small  right apical pneumothorax is  unchanged.   Current examination: Site is clean/dry. No air leak.   Chest tube to be managed by the Pulmonary/Critical care team. Please contact IR if any issues with tube or if IR needs to remove the drain.     Electronically Signed: Soyla Dryer, AGACNP-BC [phone] 05/05/2021, 2:49 PM   I spent a total of 15 Minutes at the the patient's bedside AND on the patient's hospital floor or unit, greater than 50% of which was counseling/coordinating care for right chest tube.

## 2021-05-06 ENCOUNTER — Inpatient Hospital Stay (HOSPITAL_COMMUNITY): Payer: Medicare HMO

## 2021-05-06 LAB — SODIUM, URINE, RANDOM: Sodium, Ur: 47 mmol/L

## 2021-05-06 LAB — CYTOLOGY - NON PAP

## 2021-05-06 NOTE — Evaluation (Signed)
Occupational Therapy Evaluation Patient Details Name: Adrian Olson MRN: [id number] DOB: 03-23-1931 Today's Date: 05/06/2021   History of Present Illness 87 year old male with recent admission to [facility] from 04/24/21-04/26/21 due to having an episode of unresponsiveness at home this morning.  CT angiogram of chest abdomen pelvis with large right pleural effusion collection and small moderate left fluid collection with associated bibasilar atelectasis and multiple mildly displaced and slightly comminuted rib fractures at the posterior lateral right ribs 6 through 11. Pt required return to [facility] hospital with pt reported new fall and recurrent RT effusion.  Chest tube placed to suction.  PMH: hypertension, hyperlipidemia, MVP with MR s/p mitral valve clip, carotid artery stenosis, DM type II   Clinical Impression   Patient is currently requiring light assistance with LB and out of bed ADLs including Min guard assist with toileting, with LE dressing, and with bathing, and setup assist with seated UE ADLs due to pt is very quick to fatigue.  Current level of function is below patient's typical baseline.  During this evaluation, patient was limited by generalized weakness, impaired activity tolerance, and pain at chest tube insertion site, all of which has the potential to impact patient's safety and independence during functional mobility, as well as performance for ADLs.  Patient lives with her spouse, who is able to provide 24/7 supervision and light assistance.  Patient demonstrates good rehab potential, and should benefit from continued skilled occupational therapy services while in acute care to maximize safety, independence and quality of life at home.   ?    Recommendations for follow up therapy are one component of a multi-disciplinary discharge planning process, led by the attending physician.  Recommendations may be updated based on patient status, additional functional criteria and insurance  authorization.   Follow Up Recommendations  Home health OT    Assistance Recommended at Discharge Intermittent Supervision/Assistance  Functional Status Assessment  Patient has had a recent decline in their functional status and demonstrates the ability to make significant improvements in function in a reasonable and predictable amount of time.  Equipment Recommendations  None recommended by OT    Recommendations for Other Services       Precautions / Restrictions Precautions Precautions: Fall Precaution Comments: R rib 6-11 fx, watch vitals, RT sidede chest tube to suction Restrictions Weight Bearing Restrictions: No      Mobility Bed Mobility               General bed mobility comments: Pt in recliner    Transfers Overall transfer level: Needs assistance   Transfers: Sit to/from Stand Sit to Stand: Min guard                  Balance Overall balance assessment: Mild deficits observed, not formally tested                                         ADL either performed or assessed with clinical judgement   ADL Overall ADL's : Needs assistance/impaired Eating/Feeding: Independent   Grooming: Standing;Wash/dry hands;Supervision/safety   Upper Body Bathing: Supervision/ safety;Sitting   Lower Body Bathing: Min guard;Sit to/from stand   Upper Body Dressing : Set up;Sitting   Lower Body Dressing: Min guard;Sit to/from stand;Sitting/lateral leans Lower Body Dressing Details (indicate cue type and reason): Pt able to demo figure 4 position to demonstrate donning and doffing of each sock with  RPE of ~8/10 and need of extended rest break after.   Toilet Transfer Details (indicate cue type and reason): Pt too fatigued after bathwi th CNA to ambulate to bathroom for OT Evaluation. Please mobility for transfers from recliner. Toileting- Clothing Manipulation and Hygiene: Supervision/safety       Functional mobility during ADLs: Min  guard;Supervision/safety       Vision Patient Visual Report: No change from baseline Vision Assessment?: No apparent visual deficits     Perception Perception Perception: Within Functional Limits   Praxis Praxis Praxis: Intact    Pertinent Vitals/Pain Pain Assessment: 0-10 Pain Score: 1  Pain Location: CT insertion site Pain Descriptors / Indicators: Discomfort;Grimacing Pain Intervention(s): Limited activity within patient's tolerance;Monitored during session;Repositioned     Hand Dominance Right   Extremity/Trunk Assessment Upper Extremity Assessment Upper Extremity Assessment: Overall WFL for tasks assessed (Guarding proximally due to rib fractures and chest tube, but overall functional use of UEs)   Lower Extremity Assessment Lower Extremity Assessment: Defer to PT evaluation   Cervical / Trunk Assessment Cervical / Trunk Assessment: Normal   Communication Communication Communication: HOH   Cognition Arousal/Alertness: Awake/alert Behavior During Therapy: WFL for tasks assessed/performed Overall Cognitive Status: Within Functional Limits for tasks assessed                                       General Comments       Exercises     Shoulder Instructions      Home Living Family/patient expects to be discharged to:: Private residence Living Arrangements: Spouse/significant other Available Help at Discharge: Family;Available 24 hours/day;Other (Comment) (Spouse is disabled but can offer light assistance and can complete IADLs) Type of Home: House Home Access: Stairs to enter Entrance Stairs-Number of Steps: 1   Home Layout: One level     Bathroom Shower/Tub: Occupational psychologist: Standard Bathroom Accessibility: Yes How Accessible: Accessible via walker Home Equipment: BSC/3in1;Rollator (4 wheels);Hand held shower head   Additional Comments: Hearing aids at home.      Prior Functioning/Environment Prior Level of  Function : Independent/Modified Independent             Mobility Comments: reports walking 2-3 miles a day prior to recent hospitalization. Since home on 12/13, pt reports resuming ambulation without an AD. ADLs Comments: independent with ADLs and drives. Pt reports that he has not required assisatnce with any BADLs since returning home on 12/13. Pt has a housekeeper 1x/month for heavy cleaning.        OT Problem List: Impaired balance (sitting and/or standing);Decreased activity tolerance;Decreased knowledge of use of DME or AE;Pain      OT Treatment/Interventions: Self-care/ADL training;Therapeutic exercise;Energy conservation;DME and/or AE instruction;Therapeutic activities;Patient/family education;Balance training    OT Goals(Current goals can be found in the care plan section) Acute Rehab OT Goals Patient Stated Goal: Stop falling, Return home OT Goal Formulation: With patient Time For Goal Achievement: 05/20/21 Potential to Achieve Goals: Good ADL Goals Pt Will Perform Lower Body Dressing: with adaptive equipment;with modified independence;sit to/from stand;sitting/lateral leans Pt Will Transfer to Toilet: with modified independence;ambulating Pt Will Perform Tub/Shower Transfer: Shower transfer;with supervision Additional ADL Goal #1: Patient will identify at least 3 energy conservation strategies to employ at home in order to maximize function and quality of life and decrease caregiver burden while preventing exacerbation of symptoms and rehospitalization. Additional ADL Goal #2: Pt will engage  in at least 8 min functional activities without loss of balance, and with seated rest breaks as needed to pace self, in order to demonstrate improved activity tolerance and standing balance needed to perform ADLs safely at home.  After activity pt will have RPE no greater than 4/10.  OT Frequency: Min 2X/week   Barriers to D/C:            Co-evaluation              AM-PAC OT  "6 Clicks" Daily Activity     Outcome Measure Help from another person eating meals?: None Help from another person taking care of personal grooming?: A Little Help from another person toileting, which includes using toliet, bedpan, or urinal?: A Little Help from another person bathing (including washing, rinsing, drying)?: A Little Help from another person to put on and taking off regular upper body clothing?: A Little Help from another person to put on and taking off regular lower body clothing?: A Little 6 Click Score: 19   End of Session Equipment Utilized During Treatment: Rolling walker (2 wheels) Nurse Communication: Mobility status  Activity Tolerance: Patient tolerated treatment well Patient left: with call bell/phone within reach;with family/visitor present;in chair  OT Visit Diagnosis: Unsteadiness on feet (R26.81);Repeated falls (R29.6);History of falling (Z91.81);Muscle weakness (generalized) (M62.81);Pain Pain - Right/Left: Right Pain - part of body:  (flank)                Time: 1001-1026 OT Time Calculation (min): 25 min Charges:  OT General Charges $OT Visit: 1 Visit OT Evaluation $OT Eval Low Complexity: 1 Low  Lovelyn Sheeran, Marissa Office: [phone] 05/06/2021  Julien Girt 05/06/2021, 12:45 PM

## 2021-05-06 NOTE — Progress Notes (Signed)
°  Transition of Care [facility]) Screening Note   Patient Details  Name: Adrian Olson Date of Birth: [birth year]   Transition of Care (TOC) CM/SW Contact:    Joaquin Courts, RN Phone Number: 05/06/2021, 11:44 AM    Transition of Care Department [facility]) has reviewed patient and no TOC needs have been identified at this time. We will continue to monitor patient advancement through interdisciplinary progression rounds. If new patient transition needs arise, please place a TOC consult.

## 2021-05-06 NOTE — Evaluation (Signed)
Physical Therapy Evaluation Patient Details Name: Adrian Olson MRN: [id number] DOB: 1930-06-07 Today's Date: 05/06/2021  History of Present Illness  87 year old male with recent admission to [facility] from 04/24/21-04/26/21 due to having an episode of unresponsiveness at home this morning.  CT angiogram of chest abdomen pelvis with large right pleural effusion collection and small moderate left fluid collection with associated bibasilar atelectasis and multiple mildly displaced and slightly comminuted rib fractures at the posterior lateral right ribs 6 through 11. Pt required return to [facility] hospital with pt reported new fall and recurrent RT effusion.  Chest tube placed to suction.  PMH: hypertension, hyperlipidemia, MVP with MR s/p mitral valve clip, carotid artery stenosis, DM type II  Clinical Impression  On eval, pt was Min guard assist for mobility. He walked ~250 feet around the unit. Pt tolerated activity well. He was able to converse while ambulating. Will plan to follow and progress activity as tolerated. Recommend daily ambulation in hallway in addition to PT sessions. Pt/family are agreeable to HHPT f/u       Recommendations for follow up therapy are one component of a multi-disciplinary discharge planning process, led by the attending physician.  Recommendations may be updated based on patient status, additional functional criteria and insurance authorization.  Follow Up Recommendations Home health PT    Assistance Recommended at Discharge Frequent or constant Supervision/Assistance  Functional Status Assessment Patient has had a recent decline in their functional status and demonstrates the ability to make significant improvements in function in a reasonable and predictable amount of time.  Equipment Recommendations  None recommended by PT    Recommendations for Other Services       Precautions / Restrictions Precautions Precautions: Fall Precaution Comments: R rib 6-11 fx, R  side chest tube Restrictions Weight Bearing Restrictions: No      Mobility  Bed Mobility Overal bed mobility: Needs Assistance Bed Mobility: Supine to Sit;Sit to Supine     Supine to sit: Supervision;HOB elevated Sit to supine: Supervision;HOB elevated   General bed mobility comments: Increased time. Relied on bed rails    Transfers Overall transfer level: Needs assistance Equipment used: Rollator (4 wheels) Transfers: Sit to/from Stand Sit to Stand: Min guard           General transfer comment: min guard for safety    Ambulation/Gait Ambulation/Gait assistance: Min guard Gait Distance (Feet): 250 Feet Assistive device: Rollator (4 wheels) Gait Pattern/deviations: Decreased stride length;Step-through pattern       General Gait Details: Fair gait speed. Able to converse while ambulating. Tolerated distance well.  Stairs            Wheelchair Mobility    Modified Rankin (Stroke Patients Only)       Balance Overall balance assessment: Mild deficits observed, not formally tested                                           Pertinent Vitals/Pain Pain Assessment: Faces Faces Pain Scale: Hurts little more Pain Location: Chest tube site, L shoulder with overhead ROM Pain Descriptors / Indicators: Discomfort;Sore Pain Intervention(s): Limited activity within patient's tolerance;Monitored during session;Repositioned    Home Living Family/patient expects to be discharged to:: Private residence Living Arrangements: Spouse/significant other Available Help at Discharge: Family;Available 24 hours/day;Other (Comment) Type of Home: House Home Access: Stairs to enter   [employer] of Steps: 1  Home Layout: One level Home Equipment: BSC/3in1;Rollator (4 wheels);Hand held shower head      Prior Function Prior Level of Function : Independent/Modified Independent             Mobility Comments: reports walking 2-3 miles a day  prior to recent hospitalization. Since home on 12/13, pt reports resuming ambulation without an AD. ADLs Comments: independent with ADLs and drives. Pt reports that he has not required assisatnce with any BADLs since returning home on 12/13. Pt has a housekeeper 1x/month for heavy cleaning.     Hand Dominance        Extremity/Trunk Assessment   Upper Extremity Assessment Upper Extremity Assessment: Defer to OT evaluation    Lower Extremity Assessment Lower Extremity Assessment: Generalized weakness    Cervical / Trunk Assessment Cervical / Trunk Assessment: Normal  Communication   Communication: HOH  Cognition Arousal/Alertness: Awake/alert Behavior During Therapy: WFL for tasks assessed/performed Overall Cognitive Status: Within Functional Limits for tasks assessed                                          General Comments      Exercises     Assessment/Plan    PT Assessment Patient needs continued PT services  PT Problem List Decreased strength;Decreased mobility;Decreased activity tolerance;Decreased balance;Decreased knowledge of use of DME       PT Treatment Interventions DME instruction;Therapeutic activities;Gait training;Therapeutic exercise;Patient/family education;Balance training;Functional mobility training    PT Goals (Current goals can be found in the Care Plan section)  Acute Rehab PT Goals Patient Stated Goal: home PT Goal Formulation: With patient/family Time For Goal Achievement: 05/20/21 Potential to Achieve Goals: Good    Frequency Min 3X/week   Barriers to discharge        Co-evaluation               AM-PAC PT "6 Clicks" Mobility  Outcome Measure Help needed turning from your back to your side while in a flat bed without using bedrails?: A Little Help needed moving from lying on your back to sitting on the side of a flat bed without using bedrails?: A Little Help needed moving to and from a bed to a chair  (including a wheelchair)?: A Little Help needed standing up from a chair using your arms (e.g., wheelchair or bedside chair)?: A Little Help needed to walk in hospital room?: A Little Help needed climbing 3-5 steps with a railing? : A Little 6 Click Score: 18    End of Session   Activity Tolerance: Patient tolerated treatment well Patient left: in bed;with call bell/phone within reach;with family/visitor present   PT Visit Diagnosis: Other abnormalities of gait and mobility (R26.89)    Time: 1600-1630 PT Time Calculation (min) (ACUTE ONLY): 30 min   Charges:   PT Evaluation $PT Eval Moderate Complexity: 1 Mod PT Treatments $Gait Training: 8-22 mins          Doreatha Massed, PT Acute Rehabilitation  Office: [phone] Pager: [phone]

## 2021-05-06 NOTE — Progress Notes (Signed)
NAME:  Adrian Olson, MRN:  [id number], DOB:  [birth year], LOS: 3 ADMISSION DATE:  05/03/2021, CONSULTATION DATE:  12/20 REFERRING MD: Dr. Erin Fulling, CHIEF COMPLAINT: Shortness of breath  History of Present Illness:  87 y/o M who presented 12/20 with reports of increasing shortness of breath in the setting of rib fractures and pleural effusion.   The patient was recently seen by PCCM in hospital 12/11 for traumatic hemothorax s/p fall and subsequently performed right thoracentesis yielding 1400 ML fluid. He was d/c'd to home on 13th. He was seen for post-hospital f/u on 12/20 in our clinic. He reported he was doing well until 12/19 when felt weak and more SOB. CXR showed reoccurrence of right effusion so he was referred for direct admission. Of note, his HCTZ was stopped during last admission due to hyponatremia, dehydration and dizziness.   Pertinent  Medical History  Bilateral carotid artery disease ([place]), Diabetes mellitus without complication [facility]), ED (erectile dysfunction), Hyperlipidemia, Hypertension, Lumbar disc disease, Mitral valve prolapse, Moderate mitral regurgitation (03/16/2014), and S/P mitral valve clip implantation (10/09/2019). DM type II Hypothyroidism  HL BPH Multiple falls Orthostasis Recent admits s/p fall c/b multiple rib fractures of the right ribs 6 through 11 w/ associated effusion. We performed right thoracentesis drawing 1400 ML bloody pleural fluid   Significant Hospital Events: Including procedures, antibiotic start and stop dates in addition to other pertinent events   12/20 Admit with SOB, recurrent pleural effusion  12/21 pigtail by IR 12/22 interval improvement on CXR. Pleural labs still pending    Subjective: Feels a bit better, stronger voice. Has been doing deep breathing exercises.  Chest tube output > 540cc 12/22 day + 120cc 12/22 nite shift = 660cc    Objective   Blood pressure 133/88, pulse 64, temperature 97.9 F (36.6 C), temperature  source Oral, resp. rate 20, height 5\' 9"  (1.753 m), weight 67.7 kg, SpO2 97 %.        Intake/Output Summary (Last 24 hours) at 05/06/2021 1308 Last data filed at 05/06/2021 0820 Gross per 24 hour  Intake 460 ml  Output 1415 ml  Net -955 ml   Filed Weights   05/03/21 2150  Weight: 67.7 kg    Examination: General: thin elderly man, NAD HEENT: OP clear, no icterus, no stridor Neuro: Awake, alert, + FC, moves all ext CV:  regular, 2/6 syst M PULM:decreased R base, no wheeze or crackles.  GI: soft, NT, + BS Extremities: no edema Skin: no rash  Resolved Hospital Problem list     Assessment & Plan:   Recurrent Right sided pleural effusion Recent fall with Rib fx 03/2021 Transudative on initial fluid from prior admission w Negative Cytology.  Mixed picture on this admission > low protein but high LDH. WBC 734 (61% lymphs, 12% eos, 12% mono). Cytology pending Pleural fluid from 12/21 does not appear bloody. Query r/t underlying volume status & CV status.  P -IR placed pigtail 12/21 -follow cultures and cytology -chest tube to waterseal and follow output; ? Remove soon depending on drainage -check CT chest to better characterize RLL post-thora -pain control -pulm hygiene  Hyponatremia of unknown chronicity -possibly chronic -- all 2022 values have been in 120s range. In 2021 ranged 132-137 -was taken off of HCTZ recently  P -follow trend  -encourage PO intake  -check urine osm and Na  MVR s/p mitraclip  HTN HLD -continue altace, lopressor -continue pravachol   Hypothyroidism  -continue synthroid   DM2 -continue Metformin   BPH  -  cont flomax, proscar   Anxiety, insomnia  Depression -xanax 0.25 BID PRN for anxiety  -ambien 5mg  QHS PRN sleep  -continue Venlafaxine ER 75mg  qD   Physical deconditioning Frequent falls -sounds like unsteady at home with multiple falls, including fall in 11/22 when pt broke numerous ribs.  -progressive deconditioning since  this trauma P -continue PT/OT      Baltazar Apo, MD, PhD 05/06/2021, 1:19 PM  Pulmonary and Critical Care [phone] or if no answer before 7:00PM call [phone] For any issues after 7:00PM please call eLink [phone]

## 2021-05-07 ENCOUNTER — Inpatient Hospital Stay (HOSPITAL_COMMUNITY): Payer: Medicare HMO

## 2021-05-07 LAB — BASIC METABOLIC PANEL
Anion gap: 7 (ref 5–15)
BUN: 16 mg/dL (ref 8–23)
CO2: 24 mmol/L (ref 22–32)
Calcium: 8.2 mg/dL — ABNORMAL LOW (ref 8.9–10.3)
Chloride: 97 mmol/L — ABNORMAL LOW (ref 98–111)
Creatinine, Ser: 0.93 mg/dL (ref 0.61–1.24)
GFR, Estimated: >60 mL/min (ref >60)
Glucose, Bld: 133 mg/dL — ABNORMAL HIGH (ref 70–99)
Potassium: 4 mmol/L (ref 3.5–5.1)
Sodium: 128 mmol/L — ABNORMAL LOW (ref 135–145)

## 2021-05-07 LAB — OSMOLALITY, URINE: Osmolality, Ur: 417 mOsm/kg (ref 300–900)

## 2021-05-07 LAB — OSMOLALITY: Osmolality: 272 mOsm/kg — ABNORMAL LOW (ref 275–295)

## 2021-05-07 MED ORDER — DOXYCYCLINE HYCLATE 100 MG PO TABS
100.0000 mg | ORAL_TABLET | Freq: Two times a day (BID) | ORAL | Status: DC
  Administered 2021-05-07 – 2021-05-09 (×5): 100 mg via ORAL
  Filled 2021-05-07 (×5): qty 1

## 2021-05-07 MED ORDER — AQUAPHOR EX OINT
TOPICAL_OINTMENT | Freq: Two times a day (BID) | CUTANEOUS | Status: DC | PRN
  Administered 2021-05-07 – 2021-05-08 (×2): 1 via TOPICAL
  Filled 2021-05-07: qty 50

## 2021-05-07 MED ORDER — ACETAMINOPHEN 325 MG PO TABS
650.0000 mg | ORAL_TABLET | Freq: Three times a day (TID) | ORAL | Status: DC | PRN
  Administered 2021-05-07: 09:00:00 650 mg via ORAL
  Filled 2021-05-07: qty 2

## 2021-05-07 NOTE — Progress Notes (Signed)
NAME:  Adrian Olson, MRN:  [id number], DOB:  [birth year], LOS: 4 ADMISSION DATE:  05/03/2021, CONSULTATION DATE:  12/20 REFERRING MD: Dr. Erin Fulling, CHIEF COMPLAINT: Shortness of breath  History of Present Illness:  87 y/o M who presented 12/20 with reports of increasing shortness of breath in the setting of rib fractures and pleural effusion.   The patient was recently seen by PCCM in hospital 12/11 for traumatic hemothorax s/p fall and subsequently performed right thoracentesis yielding 1400 ML fluid. He was d/c'd to home on 13th. He was seen for post-hospital f/u on 12/20 in our clinic. He reported he was doing well until 12/19 when felt weak and more SOB. CXR showed reoccurrence of right effusion so he was referred for direct admission. Of note, his HCTZ was stopped during last admission due to hyponatremia, dehydration and dizziness.   Pertinent  Medical History  Bilateral carotid artery disease ([place]), Diabetes mellitus without complication [facility]), ED (erectile dysfunction), Hyperlipidemia, Hypertension, Lumbar disc disease, Mitral valve prolapse, Moderate mitral regurgitation (03/16/2014), and S/P mitral valve clip implantation (10/09/2019). DM type II Hypothyroidism  HL BPH Multiple falls Orthostasis Recent admits s/p fall c/b multiple rib fractures of the right ribs 6 through 11 w/ associated effusion. We performed right thoracentesis drawing 1400 ML bloody pleural fluid   Significant Hospital Events: Including procedures, antibiotic start and stop dates in addition to other pertinent events   12/20 Admit with SOB, recurrent pleural effusion  12/21 pigtail by IR 12/22 interval improvement on CXR. Pleural labs still pending    Subjective: Chest tube output recorded at 530 cc over last 24 hours, 80 cc over the night shift on waterseal Remains globally weak Headache this morning, requested Tylenol   Objective   Blood pressure 119/72, pulse 63, temperature (!) 97.4 F (36.3 C),  temperature source Oral, resp. rate 19, height 5\' 9"  (1.753 m), weight 67.2 kg, SpO2 95 %.        Intake/Output Summary (Last 24 hours) at 05/07/2021 0951 Last data filed at 05/07/2021 7425 Gross per 24 hour  Intake 540 ml  Output 2280 ml  Net -1740 ml   Filed Weights   05/03/21 2150 05/07/21 0508  Weight: 67.7 kg 67.2 kg    Examination: General: thin man, NAD HEENT:strong voice, OP clear, no stridor Neuro: Awake, alert, follows commands, moves all ext, well oriented  CV:  regular, 2/6 syst M PULM: decreased R base, no wheeze GI: non-distended, + BS Extremities: no edema Skin: no rash  Resolved Hospital Problem list     Assessment & Plan:   Recurrent Right sided pleural effusion, pneumothorax Recent fall with Rib fx 03/2021 Mild right lower lobe and right lower lobe patchy infiltrate. Transudative on initial fluid from prior admission w Negative Cytology.  Mixed picture on this admission > low protein but high LDH. WBC 734 (61% lymphs, 12% eos, 12% mono). Cytology pending Pleural fluid from 12/21 does not appear bloody. Query r/t underlying volume status & CV status.  P -IR placed pigtail 12/21 -All culture data.  Cytology reassuring.  Cell count inconsistent with empyema -Over 500 cc output last 24 hours.  Suspect that he will quickly reaccumulate if we remove chest tube.  Unclear endpoint here; also unclear cause. ? Nutritional and volume status contributing to pleural fluid -No clear evidence to support pneumonia.  Right lower lobe and middle lobe infiltrates could reflect some component of atelectasis, residua from his trauma.  Will start po doxycycline for completeness. No clear indication for  steroids.  -residual pleural air without an air leak. Does not seem to be enlarging on waterseal. ? Component trapped lung, ? Intermittent mild air leak following his rib fractures 1 month ago.  -Pulmonary hygiene -Pain control  Hyponatremia of unknown chronicity -possibly  chronic -- all 2022 values have been in 120s range. In 2021 ranged 132-137 -was taken off of HCTZ recently  -No evidence SIADH based on his urine osmolality and sodium P -Following BMP -encourage PO intake   MVR s/p mitraclip  HTN HLD -Continue altace, lopressor -Continue pravachol  -Remains off HCTZ  Hypothyroidism  -Continue synthroid   DM2 -Continue metformin   BPH  -Continue flomax, proscar   Anxiety, insomnia  Depression -Using xanax 0.25 BID PRN for anxiety, plan to continue -Using ambien 5mg  QHS PRN sleep, plan to continue -Continue venlafaxine ER 75mg  qD   Physical deconditioning Frequent falls -sounds like unsteady at home with multiple falls, including fall in 11/22 when pt broke numerous ribs.  -progressive deconditioning since this trauma P -Push PT/OT -Will need to determine whether he -Will be safe to go home, versus need rehab, etc.   Called to update daughter 12/24. Left VM.      Baltazar Apo, MD, PhD 05/07/2021, 9:51 AM [facility] [phone] or if no answer before 7:00PM call [phone] For any issues after 7:00PM please call eLink [phone]

## 2021-05-08 MED ORDER — DOCUSATE SODIUM 100 MG PO CAPS
100.0000 mg | ORAL_CAPSULE | Freq: Every day | ORAL | Status: DC | PRN
  Administered 2021-05-08: 07:00:00 100 mg via ORAL
  Filled 2021-05-08: qty 1

## 2021-05-08 NOTE — Progress Notes (Signed)
[facility] Progress Note Patient Name: Adrian Olson DOB: [birth year] MRN: [id number]   Date of Service  05/08/2021  HPI/Events of Note  Patient asking for PRN stool softener   eICU Interventions  Colace ordered     Intervention Category Intermediate Interventions: Other:  Margaretmary Lombard 05/08/2021, 5:46 AM

## 2021-05-08 NOTE — Progress Notes (Signed)
NAME:  Adrian Olson, MRN:  [id number], DOB:  Nov 02, 1930, LOS: 5 ADMISSION DATE:  05/03/2021, CONSULTATION DATE:  12/20 REFERRING MD: Dr. Erin Fulling, CHIEF COMPLAINT: Shortness of breath  History of Present Illness:  87 y/o M who presented 12/20 with reports of increasing shortness of breath in the setting of rib fractures and pleural effusion.   The patient was recently seen by PCCM in hospital 12/11 for traumatic hemothorax s/p fall and subsequently performed right thoracentesis yielding 1400 ML fluid. He was d/c'd to home on 13th. He was seen for post-hospital f/u on 12/20 in our clinic. He reported he was doing well until 12/19 when felt weak and more SOB. CXR showed reoccurrence of right effusion so he was referred for direct admission. Of note, his HCTZ was stopped during last admission due to hyponatremia, dehydration and dizziness.   Pertinent  Medical History  Bilateral carotid artery disease ([place]), Diabetes mellitus without complication [facility]), ED (erectile dysfunction), Hyperlipidemia, Hypertension, Lumbar disc disease, Mitral valve prolapse, Moderate mitral regurgitation (03/16/2014), and S/P mitral valve clip implantation (10/09/2019). DM type II Hypothyroidism  HL BPH Multiple falls Orthostasis Recent admits s/p fall c/b multiple rib fractures of the right ribs 6 through 11 w/ associated effusion. We performed right thoracentesis drawing 1400 ML bloody pleural fluid   Significant Hospital Events: Including procedures, antibiotic start and stop dates in addition to other pertinent events   12/20 Admit with SOB, recurrent pleural effusion  12/21 pigtail by IR 12/22 interval improvement on CXR. Pleural labs still pending    Subjective: Chest tube output decreasing, down to 190 cc over the last 24 hours Has had some constipation, Colace initiated Tired of being in the hospital, feels cooped up   Objective   Blood pressure 135/70, pulse 60, temperature 97.8 F (36.6 C),  temperature source Oral, resp. rate 19, height 5\' 9"  (1.753 m), weight 67.2 kg, SpO2 95 %.        Intake/Output Summary (Last 24 hours) at 05/08/2021 1105 Last data filed at 05/08/2021 0700 Gross per 24 hour  Intake --  Output 1200 ml  Net -1200 ml   Filed Weights   05/03/21 2150 05/07/21 0508  Weight: 67.7 kg 67.2 kg    Examination: General: Thin gentleman, comfortable HEENT: Oropharynx clear, strong voice, no stridor Neuro: Awake, alert, interacting appropriately, nonfocal CV: Regular, 2/6 systolic murmur PULM: Few bibasilar inspiratory crackles best heard on the right.  Chest tube to waterseal, no evidence of an air leak GI: Nondistended, positive bowel sounds Extremities: No edema Skin: No rash  Resolved Hospital Problem list     Assessment & Plan:   Recurrent Right sided pleural effusion, pneumothorax Recent fall with Rib fx 03/2021 Mild right lower lobe and right lower lobe patchy infiltrate. Transudative on initial fluid from prior admission w Negative Cytology.  Mixed picture on this admission > low protein but high LDH. WBC 734 (61% lymphs, 12% eos, 12% mono). Cytology pending Pleural fluid from 12/21 does not appear bloody. Query r/t underlying volume status & CV status.  P -IR placed pigtail 12/21 -All culture data.  Cytology reassuring.  Cell count inconsistent with empyema -Output decreasing, 190 cc over last 24 hours.  Hopefully will continue to drop.  Optimally would like for output to be 50-100 cc in 24 hours before pulling chest tube.  His overall nutritional status, volume status likely contributing some to his pleural fluid. If no significant increase in the next 24h believe we should go ahead and pull  the chest tube -Started empiric doxycycline given some patchy infiltrates seen in the right lower lobe on CT chest.  This even in absence of any evidence for clinical pneumonia.  Plan to continue 5 days.  No clear indication for steroids. -Question component  of trapped lung given his persistent apical pneumothorax.  I do not see any evidence of an air leak -Pulmonary hygiene -Pain control  Hyponatremia of unknown chronicity -possibly chronic -- all 2022 values have been in 120s range. In 2021 ranged 132-137 -was taken off of HCTZ recently  -No evidence SIADH based on his urine osmolality and sodium P -Plan to follow BMP -Encourage p.o. intake  Constipation -Colace added  MVR s/p mitraclip  HTN HLD -Continue altace, lopressor -Continue pravachol  -Remains off HCTZ  Hypothyroidism  -Continue synthroid   DM2 -Continue metformin   BPH  -Continue flomax, proscar   Anxiety, insomnia  Depression -Using xanax 0.25 BID PRN for anxiety, plan to continue -Using ambien 5mg  QHS PRN sleep, plan to continue -Continue venlafaxine ER 75mg  qD   Physical deconditioning Frequent falls -sounds like unsteady at home with multiple falls, including fall in 11/22 when pt broke numerous ribs.  -progressive deconditioning since this trauma P -Push PT/OT -Will need to determine whether he -Will be safe to go home, versus need rehab, etc.   Status reviewed w patient 12/25, with family 12/24     Baltazar Apo, MD, PhD 05/08/2021, 11:05 AM [facility] [phone] or if no answer before 7:00PM call [phone] For any issues after 7:00PM please call eLink [phone]

## 2021-05-09 ENCOUNTER — Inpatient Hospital Stay (HOSPITAL_COMMUNITY): Payer: Medicare HMO

## 2021-05-09 DIAGNOSIS — I517 Cardiomegaly: Secondary | ICD-10-CM | POA: Diagnosis not present

## 2021-05-09 DIAGNOSIS — N400 Enlarged prostate without lower urinary tract symptoms: Secondary | ICD-10-CM | POA: Diagnosis present

## 2021-05-09 DIAGNOSIS — F32A Depression, unspecified: Secondary | ICD-10-CM | POA: Diagnosis present

## 2021-05-09 DIAGNOSIS — J811 Chronic pulmonary edema: Secondary | ICD-10-CM | POA: Diagnosis not present

## 2021-05-09 DIAGNOSIS — J939 Pneumothorax, unspecified: Secondary | ICD-10-CM | POA: Diagnosis not present

## 2021-05-09 DIAGNOSIS — J90 Pleural effusion, not elsewhere classified: Secondary | ICD-10-CM | POA: Diagnosis not present

## 2021-05-09 DIAGNOSIS — F419 Anxiety disorder, unspecified: Secondary | ICD-10-CM | POA: Diagnosis present

## 2021-05-09 DIAGNOSIS — E1169 Type 2 diabetes mellitus with other specified complication: Secondary | ICD-10-CM

## 2021-05-09 LAB — BODY FLUID CULTURE W GRAM STAIN: Culture: NO GROWTH

## 2021-05-09 MED ORDER — VENLAFAXINE HCL ER 75 MG PO CP24: 75.0000 mg | ORAL_CAPSULE | Freq: Every day | ORAL | Status: DC

## 2021-05-09 MED ORDER — DOXYCYCLINE HYCLATE 100 MG PO TABS
100.0000 mg | ORAL_TABLET | Freq: Two times a day (BID) | ORAL | 0 refills | Status: AC
Start: 2021-05-09 — End: 2021-05-11

## 2021-05-09 NOTE — Progress Notes (Signed)
Physical Therapy Treatment Patient Details Name: Adrian Olson MRN: [id number] DOB: 1930-09-04 Today's Date: 05/09/2021   History of Present Illness 87 year old male with recent admission to [facility] from 04/24/21-04/26/21 due to having an episode of unresponsiveness at home this morning.  CT angiogram of chest abdomen pelvis with large right pleural effusion collection and small moderate left fluid collection with associated bibasilar atelectasis and multiple mildly displaced and slightly comminuted rib fractures at the posterior lateral right ribs 6 through 11. Pt required return to [facility] hospital with pt reported new fall and recurrent RT effusion.  Chest tube placed to suction.  PMH: hypertension, hyperlipidemia, MVP with MR s/p mitral valve clip, carotid artery stenosis, DM type II    PT Comments    Pt AxO x 3 very pleasant and motivated.  Retired Scientist, clinical (histocompatibility and immunogenetics).  Mild c/o pain at chest tube sight.  Tolerated amb around 1/2 unit with light lean on walker for support.  Pt plans to return home with spouse.     Recommendations for follow up therapy are one component of a multi-disciplinary discharge planning process, led by the attending physician.  Recommendations may be updated based on patient status, additional functional criteria and insurance authorization.  Follow Up Recommendations  Home health PT     Assistance Recommended at Discharge    Equipment Recommendations  None recommended by PT    Recommendations for Other Services       Precautions / Restrictions Precautions Precaution Comments: R rib 6-11 fx, R side chest tube     Mobility  Bed Mobility Overal bed mobility: Needs Assistance Bed Mobility: Supine to Sit     Supine to sit: Supervision;HOB elevated     General bed mobility comments: Increased time. Relied on bed rails    Transfers Overall transfer level: Needs assistance Equipment used: Rolling walker (2 wheels) Transfers: Sit to/from Stand Sit to  Stand: Min guard;Supervision           General transfer comment: light lean    Ambulation/Gait Ambulation/Gait assistance: Supervision;Min guard Gait Distance (Feet): 255 Feet   Gait Pattern/deviations: Decreased stride length;Step-through pattern Gait velocity: decreased     General Gait Details: tolerated amb 1/2 unit with light lean on walker for balance.   Stairs             Wheelchair Mobility    Modified Rankin (Stroke Patients Only)       Balance                                            Cognition Arousal/Alertness: Awake/alert Behavior During Therapy: WFL for tasks assessed/performed Overall Cognitive Status: Within Functional Limits for tasks assessed                                 General Comments: AxO x 3 very pleasant and motivated        Exercises      General Comments        Pertinent Vitals/Pain Pain Assessment: Faces Pain Location: Chest tube site, Pain Descriptors / Indicators: Discomfort;Sore Pain Intervention(s): Monitored during session;Repositioned    Home Living                          Prior Function  PT Goals (current goals can now be found in the care plan section) Progress towards PT goals: Progressing toward goals    Frequency    Min 3X/week      PT Plan Current plan remains appropriate    Co-evaluation              AM-PAC PT "6 Clicks" Mobility   Outcome Measure  Help needed turning from your back to your side while in a flat bed without using bedrails?: A Little Help needed moving from lying on your back to sitting on the side of a flat bed without using bedrails?: A Little Help needed moving to and from a bed to a chair (including a wheelchair)?: A Little Help needed standing up from a chair using your arms (e.g., wheelchair or bedside chair)?: A Little Help needed to walk in hospital room?: A Little Help needed climbing 3-5 steps with a  railing? : A Little 6 Click Score: 18    End of Session Equipment Utilized During Treatment: Gait belt Activity Tolerance: Patient tolerated treatment well Patient left: in chair;with call bell/phone within reach Nurse Communication: Mobility status PT Visit Diagnosis: Other abnormalities of gait and mobility (R26.89) Pain - Right/Left: Right     Time: 6213-0865 PT Time Calculation (min) (ACUTE ONLY): 21 min  Charges:  $Gait Training: 8-22 mins                     Rica Koyanagi  PTA Acute  Rehabilitation Services Pager      [phone] Office      [phone]

## 2021-05-09 NOTE — Progress Notes (Signed)
NAME:  Adrian Olson, MRN:  [id number], DOB:  [birth year], LOS: 6 ADMISSION DATE:  05/03/2021, CONSULTATION DATE:  12/20 REFERRING MD: Dr. Erin Fulling, CHIEF COMPLAINT: Shortness of breath  History of Present Illness:  87 y/o M who presented 12/20 with reports of increasing shortness of breath in the setting of rib fractures and pleural effusion.   The patient was recently seen by PCCM in hospital 12/11 for traumatic hemothorax s/p fall and subsequently performed right thoracentesis yielding 1400 ML fluid. He was d/c'd to home on 13th. He was seen for post-hospital f/u on 12/20 in our clinic. He reported he was doing well until 12/19 when felt weak and more SOB. CXR showed reoccurrence of right effusion so he was referred for direct admission. Of note, his HCTZ was stopped during last admission due to hyponatremia, dehydration and dizziness.   Pertinent  Medical History  Bilateral carotid artery disease ([place]), Diabetes mellitus without complication [facility]), ED (erectile dysfunction), Hyperlipidemia, Hypertension, Lumbar disc disease, Mitral valve prolapse, Moderate mitral regurgitation (03/16/2014), and S/P mitral valve clip implantation (10/09/2019). DM type II Hypothyroidism  HL BPH Multiple falls Orthostasis Recent admits s/p fall c/b multiple rib fractures of the right ribs 6 through 11 w/ associated effusion. We performed right thoracentesis drawing 1400 ML bloody pleural fluid   Significant Hospital Events: Including procedures, antibiotic start and stop dates in addition to other pertinent events   12/20 Admit with SOB, recurrent pleural effusion  12/21 pigtail by IR 12/22 interval improvement on CXR. Pleural labs still pending    Subjective: Chest tube output 190 > 250 last 24h (on waterseal) CXR today reviewed > no change tiny R effusion, small apical PTX, small L effusion.    Objective   Blood pressure (!) 142/80, pulse 75, temperature 98.1 F (36.7 C), temperature source Oral,  resp. rate 20, height 5\' 9"  (1.753 m), weight 67.2 kg, SpO2 94 %.        Intake/Output Summary (Last 24 hours) at 05/09/2021 1149 Last data filed at 05/09/2021 0820 Gross per 24 hour  Intake 480 ml  Output 400 ml  Net 80 ml   Filed Weights   05/03/21 2150 05/07/21 0508  Weight: 67.7 kg 67.2 kg    Examination: General: Thin gentleman, comfortable, sitting up in chair HEENT: Oropharynx clear, strong voice, no stridor Neuro: Awake, alert, interacting appropriately, nonfocal CV: Regular 2/6 systolic murmur PULM: Few bibasilar inspiratory crackles.  Right chest tube in place GI: Nondistended, positive bowel sounds Extremities: Thin, warm, no edema Skin: No rash  Resolved Hospital Problem list     Assessment & Plan:   Recurrent Right sided pleural effusion, pneumothorax Recent fall with Rib fx 03/2021 Mild right lower lobe and right lower lobe patchy infiltrate. Transudative on initial fluid from prior admission w Negative Cytology.  Mixed picture on this admission > low protein but high LDH. WBC 734 (61% lymphs, 12% eos, 12% mono). Cytology pending Pleural fluid from 12/21 does not appear bloody. Query r/t underlying volume status & CV status.  At this point suspect change date, volume shifts with persistent steady drainage P -I believe the pigtail chest tube has served its purpose.  No loculation, no evidence of infection or malignancy.  Still with moderate output, 250 cc last 24 hours.  Have explained to patient and family that this may relate to his chronic issues including cardiac status (off diuretics), nutritional status, etc. -Plan to pull his chest tube today 10/26 -Recheck chest x-ray this afternoon.  If no  increased pneumothorax or other concerning findings then we will plan to discharge him to home -Complete 5-day course of empiric doxycycline given his infiltrates noted on CT chest.  No clinical evidence for pneumonia -Pulmonary hygiene  Hyponatremia of unknown  chronicity -possibly chronic -- all 2022 values have been in 120s range. In 2021 ranged 132-137 -was taken off of HCTZ recently  -No evidence SIADH based on his urine osmolality and sodium P -Follow intermittent BMP -Encourage p.o. intake  Constipation -Colace added  MVR s/p mitraclip  HTN HLD -Continue Altace, Lopressor -Continue Pravachol -Remains off HCTZ.  Will need to follow with cardiology, Dr Tamala Julian, Dr Curly [place]  Hypothyroidism  -Continue levothyroxine  DM2 -Continue metformin  BPH  -Flomax, Proscar  Anxiety, insomnia  Depression -Started xanax 0.25 BID PRN for anxiety, no plan to continue for home -Using ambien 5mg  QHS PRN sleep, will not plan to continue for home -Continue venlafaxine ER 75mg  qD.  Was on this as an outpatient per his report  Physical deconditioning Frequent falls -sounds like unsteady at home with multiple falls, including fall in 11/22 when pt broke numerous ribs.  -progressive deconditioning since this trauma P -PT/OT -Family and patient would both like for him to go home as opposed to rehab, etc.    Status reviewed w patient and family 12/26     Baltazar Apo, MD, PhD 05/09/2021, 11:49 AM [facility] [phone] or if no answer before 7:00PM call [phone] For any issues after 7:00PM please call eLink [phone]

## 2021-05-09 NOTE — TOC Transition Note (Signed)
Transition of Care [facility]) - CM/SW Discharge Note   Patient Details  Name: GABRIELE LOVELAND MRN: [id number] Date of Birth: 06-11-1930  Transition of Care [facility]) CM/SW Contact:  Ross Ludwig, LCSW Phone Number: 05/09/2021, 4:40 PM   Clinical Narrative:     Patient is currenlty open to [facility] for [facility] PT and OT.  Patient will be going home with home health through [place].  CSW signing off please reconsult with any other social work needs, home health agency has been notified of planned discharge.   Final next level of care: [place] Barriers to Discharge: Barriers Resolved   Patient Goals and CMS Choice Patient states their goals for this hospitalization and ongoing recovery are:: To return back home with home health. CMS Medicare.gov Compare Post Acute Care list provided to:: Patient Choice offered to / list presented to : Patient  Discharge Placement                       Discharge Plan and Services                          HH Arranged: PT, OT [facility] Agency: [place] Date [facility] Agency Contacted: 05/09/21 Time HH Agency Contacted: 1640 Representative spoke with at [place]: [place] ([place]) Interventions     Readmission Risk Interventions No flowsheet data found.

## 2021-05-09 NOTE — Plan of Care (Signed)
°  Problem: Clinical Measurements: Goal: Respiratory complications will improve Outcome: Adequate for Discharge   Problem: Clinical Measurements: Goal: Cardiovascular complication will be avoided Outcome: Adequate for Discharge   Problem: Activity: Goal: Risk for activity intolerance will decrease Outcome: Adequate for Discharge   Problem: Nutrition: Goal: Adequate nutrition will be maintained Outcome: Adequate for Discharge

## 2021-05-09 NOTE — Discharge Summary (Signed)
Physician Discharge Summary  Patient ID: Adrian Olson MRN: [id number] DOB/AGE: August 26, 1930 87 y.o.  Admit date: 05/03/2021 Discharge date: 05/09/2021  Admission Diagnoses: Recurrent right pleural effusion  Discharge Diagnoses:  Principal Problem:   Recurrent right pleural effusion Active Problems:   Multiple closed fractures of ribs of right side   Nonrheumatic mitral (valve) insufficiency   Essential hypertension   Hyperlipidemia   Hypothyroidism   Diabetes type 2, controlled (HCC)   BPH (benign prostatic hyperplasia)   Anxiety   Depression   Discharged Condition: stable, improved  Hospital Course:  87 y/o M who presented 12/20 with reports of increasing shortness of breath in the setting of rib fractures and pleural effusion.    The patient was recently seen by PCCM in hospital 12/11 for traumatic hemothorax s/p fall and subsequently performed right thoracentesis yielding 1400 ML fluid. He was d/c'd to home on 13th. He was seen for post-hospital f/u on 12/20 in our clinic. He reported he was doing well until 12/19 when felt weak and more SOB. CXR showed reoccurrence of right effusion so he was referred for direct admission. Of note, his HCTZ was stopped during last admission due to hyponatremia, dehydration and dizziness.   Right pigtail catheter was placed in the pleural space by interventional radiology on 12/21.  2810 cc removed with initial placement.  He had improvement in chest x-ray, significant decrease in pleural fluid and a small residual apical pneumothorax.    Pleural fluid: low protein but high LDH. WBC 734 (61% lymphs, 12% eos, 12% mono). Cytology negative.  Culture negative  CT scan of the chest 05/06/2021 showed adequate drainage, trace right pleural fluid, small left effusion, some focal areas of patchy infiltrate in the right middle lobe and right lower lobe, question inflammatory or infectious.  Question contusion.  Multiple right 6th through 11th rib  fractures.  Patient was not experiencing any evidence for pneumonia.  No cough, fever, sputum.  Suspect that the right middle lobe and right lower lobe infiltrates represented atelectasis versus sequela from his trauma.  He was treated with doxycycline empirically in the event there was subclinical pneumonia present, completed 3 days and will finish 2 more days as an outpatient.  Prescription sent.  Patient continued to drain, total 4440 cc recorded.  He was still draining 250 cc/24h on 12/26 when decision was made to remove his chest tube.  He will need follow-up in pulmonary clinic, cardiology clinic to continue evaluation for what looks like transudative fluid with persistent drainage through his pigtail catheter.  Did explain to family that likely chronic issues are contributing including cardiac status, nutritional status etc.  He will need to continue evaluation for suitability for diuretics in light of his hyponatremia.  In the short-term he will need follow-up in pulmonary clinic with Dr. Erin Fulling with a chest x-ray to look for reaccumulation.  He will be discharged home in improved condition on 05/09/2021.  He already has physical therapy lined up, needs to call them to resume.  Discussed this with him and his family.   Hospital Plans per problem:  Recurrent Right sided pleural effusion, pneumothorax Recent fall with Rib fx 03/2021 Mild right lower lobe and right lower lobe patchy infiltrate. Transudative on initial fluid from prior admission w Negative Cytology.  Mixed picture on this admission > low protein but high LDH. WBC 734 (61% lymphs, 12% eos, 12% mono). Cytology negative, Culture negative Pleural fluid from 12/21 does not appear bloody. Query r/t underlying volume status &  CV status.  At this point suspect transudative process as he over a month out from his rib fractures, volume shifts with persistent steady drainage P -Have explained to patient and family that this may relate to his  chronic issues including cardiac status (off diuretics), nutritional status, etc. -Plan to pull his chest tube today 10/26 > standard site care at home -Complete 5-day course of empiric doxycycline given his infiltrates noted on CT chest.  No clinical evidence for pneumonia -Pulmonary hygiene   Hyponatremia of unknown chronicity -possibly chronic -- all 2022 values have been in 120s range. In 2021 ranged 132-137 -was taken off of HCTZ recently  -No evidence SIADH based on his urine osmolality and sodium P -will need outpatient follow-up, labs and determination appropriate cardiac / diuretic regimen  Constipation -was treated w colace    MVR s/p mitraclip  HTN HLD -Continued Altace, Lopressor -Continued Pravachol -Remains off HCTZ.  Will need to follow with cardiology, Dr Tamala Julian   Hypothyroidism  -Continued levothyroxine   DM2 -Continued metformin   BPH  -Continued Flomax, Proscar   Anxiety, insomnia  Depression -Started xanax 0.25 BID PRN for anxiety, no plan to continue for home -Using ambien 5mg  QHS PRN sleep, will not plan to continue for home -Continue venlafaxine ER 75mg  qD.  Was on this as an outpatient per his report and on review of his meds from [place] to continue   Physical deconditioning Frequent falls -sounds like unsteady at home with multiple falls, including fall in 11/22 when pt broke numerous ribs.  -progressive deconditioning since this trauma P -PT/OT arranged. Family will reinitiate -Family and patient would both like for him to go home as opposed to rehab, etc.     Consults:  Interventional radiology   Discharge Exam: Blood pressure (!) 142/80, pulse 75, temperature 98.1 F (36.7 C), temperature source Oral, resp. rate 20, height 5\' 9"  (1.753 m), weight 67.2 kg, SpO2 94 %.  General: Thin gentleman, comfortable, sitting up in chair HEENT: Oropharynx clear, strong voice, no stridor Neuro: Awake, alert, interacting  appropriately, nonfocal CV: Regular 2/6 systolic murmur PULM: Few bibasilar inspiratory crackles.  Right chest tube in place GI: Nondistended, positive bowel sounds Extremities: Thin, warm, no edema Skin: No rash  Disposition: Discharge disposition: 01-Home or Self Care       Discharge Instructions     Call MD for:   Complete by: As directed    Fever > 101 degrees   Call MD for:  difficulty breathing, headache or visual disturbances   Complete by: As directed    Call MD for:  persistant dizziness or light-headedness   Complete by: As directed    Call MD for:  redness, tenderness, or signs of infection (pain, swelling, redness, odor or green/yellow discharge around incision site)   Complete by: As directed    Diet - low sodium heart healthy   Complete by: As directed    Discharge instructions   Complete by: As directed    OK to slowly resume activity with assistance and your walker. Contact your Physical Therapy provider to re-establish your treatment routine.   Finish 5 day course of doxycycline (2 more days)   Discharge wound care:   Complete by: As directed    Keep right chest tube site clean with soap and water. Ok to cover your chest tube insertion site with a simple band-aid.   Increase activity slowly   Complete by: As directed  Allergies as of 05/09/2021       Reactions   Duloxetine Hcl Other (See Comments)   withdrawal        Medication List     STOP taking these medications    HYDROcodone-acetaminophen 7.5-325 MG tablet Commonly known as: NORCO       TAKE these medications    aspirin 81 MG EC tablet Take 81 mg by mouth daily.   doxycycline 100 MG tablet Commonly known as: VIBRA-TABS Take 1 tablet (100 mg total) by mouth every 12 (twelve) hours for 2 days.   finasteride 5 MG tablet Commonly known as: PROSCAR Take 5 mg by mouth daily at 12 noon.   levothyroxine 75 MCG tablet Commonly known as: SYNTHROID Take 75 mcg by mouth daily  before breakfast.   metFORMIN 500 MG tablet Commonly known as: GLUCOPHAGE Take 500 mg by mouth every evening.   metoprolol tartrate 25 MG tablet Commonly known as: LOPRESSOR Take 0.5 tablets (12.5 mg total) by mouth 2 (two) times daily.   pravastatin 20 MG tablet Commonly known as: PRAVACHOL Take 20 mg by mouth at bedtime.   psyllium 58.6 % packet Commonly known as: METAMUCIL Take 1 packet by mouth daily.   ramipril 5 MG capsule Commonly known as: ALTACE Take 5 mg by mouth daily.   sodium chloride 0.65 % Soln nasal spray Commonly known as: OCEAN Place 1 spray into both nostrils daily as needed for congestion.   tamsulosin 0.4 MG Caps capsule Commonly known as: FLOMAX Take 0.4 mg by mouth daily at 12 noon.   venlafaxine XR 75 MG 24 hr capsule Commonly known as: EFFEXOR-XR Take 1 capsule (75 mg total) by mouth daily with breakfast. Start taking on: May 10, 2021   Vitamin B-12 2000 MCG Tbcr 1 tablet   VITAMIN B1 PO Take 125 mcg by mouth daily.   Vitamin D 50 MCG (2000 UT) tablet Take 2,000 Units by mouth daily at 12 noon.               Discharge Care Instructions  (From admission, onward)           Start     Ordered   05/09/21 0000  Discharge wound care:       Comments: Keep right chest tube site clean with soap and water. Ok to cover your chest tube insertion site with a simple band-aid.   05/09/21 1441            Follow-up Information     Freddi Starr, MD Follow up in 1 week(s).   Specialty: Pulmonary Disease Why: we will call you with an appointment Contact information: [address] [place] [address] [phone]         Belva Crome, MD. Schedule an appointment as soon as possible for a visit in 2 week(s).   Specialty: Cardiology Why: Call to be set up to see Dr Tamala Julian or associate. Contact information: [address] [place] [address] [phone]                  Signed: Collene Gobble 05/09/2021, 3:13 PM

## 2021-05-10 ENCOUNTER — Telehealth: Payer: Self-pay | Admitting: Interventional Cardiology

## 2021-05-10 NOTE — TOC Progression Note (Addendum)
Transition of Care [facility]) - Progression Note    Patient Details  Name: Adrian Olson MRN: [id number] Date of Birth: 1930-08-29  Transition of Care [facility] CM/SW Contact  Ross Ludwig, [place] Phone Number: 05/10/2021, 2:23 PM  Clinical Narrative:    12/27 2:15pm:  CSW received a message from patient's daughter [name] asking if she had to notify [facility] that patient has discharged.  CSW called her back and informed her that [place] has been notified and will reach out to her.  CSW spoke to [place] at [place], she will let the scheduler know to reach out to the daughter to set up the visit.   Expected Discharge Plan: [place] Barriers to Discharge: Barriers Resolved  Expected Discharge Plan and Services Expected Discharge Plan: [place] arrangements for the past 2 months: Single Family Home Expected Discharge Date: 05/09/21                         HH Arranged: PT, OT HH Agency: [place] Date HH Agency Contacted: 05/09/21 Time HH Agency Contacted: 1640 Representative spoke with at [place]: [place] ([place]) Interventions    Readmission Risk Interventions No flowsheet data found.

## 2021-05-10 NOTE — Telephone Encounter (Signed)
Patient's daughter calling for a 2 week hospital follow up appointment. I did not see anything with Dr. Tamala Julian or an APP until March. She would like to know if he can be worked in. Patient was also added to the waitlist.

## 2021-05-10 NOTE — Telephone Encounter (Signed)
Found opening at [place] with [place] on May 25, 2021 at 2:45 pm.  Pt and EC notified and agreeable to this appointment.  Will leave f/u scheduled for March with Dr. Tamala Julian.

## 2021-05-18 ENCOUNTER — Other Ambulatory Visit: Payer: Self-pay

## 2021-05-18 ENCOUNTER — Ambulatory Visit (INDEPENDENT_AMBULATORY_CARE_PROVIDER_SITE_OTHER): Payer: Medicare HMO

## 2021-05-18 ENCOUNTER — Other Ambulatory Visit: Payer: Self-pay | Admitting: *Deleted

## 2021-05-18 ENCOUNTER — Encounter: Payer: Self-pay | Admitting: Nurse Practitioner

## 2021-05-18 ENCOUNTER — Ambulatory Visit: Payer: Medicare HMO | Admitting: Nurse Practitioner

## 2021-05-18 VITALS — BP 110/64 | HR 67 | Temp 97.4°F | Ht 69.0 in | Wt 135.4 lb

## 2021-05-18 DIAGNOSIS — R5382 Chronic fatigue, unspecified: Secondary | ICD-10-CM

## 2021-05-18 DIAGNOSIS — J90 Pleural effusion, not elsewhere classified: Secondary | ICD-10-CM

## 2021-05-18 DIAGNOSIS — F3289 Other specified depressive episodes: Secondary | ICD-10-CM | POA: Diagnosis not present

## 2021-05-18 DIAGNOSIS — I5033 Acute on chronic diastolic (congestive) heart failure: Secondary | ICD-10-CM

## 2021-05-18 DIAGNOSIS — R69 Illness, unspecified: Secondary | ICD-10-CM | POA: Diagnosis not present

## 2021-05-18 DIAGNOSIS — J9811 Atelectasis: Secondary | ICD-10-CM | POA: Diagnosis not present

## 2021-05-18 DIAGNOSIS — I340 Nonrheumatic mitral (valve) insufficiency: Secondary | ICD-10-CM

## 2021-05-18 DIAGNOSIS — R5383 Other fatigue: Secondary | ICD-10-CM | POA: Insufficient documentation

## 2021-05-18 NOTE — Patient Instructions (Addendum)
Chest x ray today showed resolution of your left pleural effusion and a significant decrease in your right. Please notify us if you develop shortness of breath, cough, or if your fatigue worsens.  Follow up with cardiology with [facility] to determine appropriate next steps for diuretic therapy.  Follow up with your primary care provider regarding your depression symptoms and discuss making a change to your current regimen.  Walking oximetry today 93%, which is good!  Follow up in one month with Dr. Erin Fulling. If symptoms do not improve or worsen, please contact office for sooner follow up or seek emergency care.

## 2021-05-18 NOTE — Progress Notes (Signed)
@Patient  ID: Polly Cobia, male    DOB: May 05, 1931, 88 y.o.   MRN: [id number]  Chief Complaint  Patient presents with   Hospitalization Follow-up    Still tired and weak.    Referring provider: Merrilee Seashore, MD  HPI: 88 year old male, never smoker followed for pleural effusion.  He is a patient of Dr. August Albino and was last seen in office on 05/03/2021.  Past medical history significant for bilateral carotid artery disease, DM2, ED, hyperlipidemia, hypertension, lumbar disc disease, MVP status post mitral valve clip.   TEST/EVENTS:  04/24/2021 CTA chest: Mild to moderate cardiomegaly.  Prosthetic mitral valve noted.  Atherosclerosis.  No PE.  Moderate to large right pleural fluid collection and small to moderate left pleural fluid collection with associated bibasilar atelectasis.  Multiple associated mildly displaced and slightly comminuted fractures of posterolateral right ribs 6 through 11.  No pneumothorax.   04/27/2019 echocardiogram: EF 55 to 60%.  Mild concentric LVH.  Unable to evaluate LV diastolic function.  Elevated left atrial pressure.  Left and right atrium is moderately dilated.  Moderate pleural effusion in the left lateral region.  MV previously repaired/replaced with moderate MV regurgitation.  Mild AVR. 05/06/2021 CT chest: Adequate drainage of previously noted pleural effusion, trace right pleural fluid, small left effusion, some focal areas of patchy infiltrate in the right middle lobe and right lower lobe.  Question inflammatory or infectious.  Question contusion.  Multiple right sixth through 11th rib fractures.  05/03/2021: OV with Dr. Erin Fulling for hospital follow-up for pleural effusion after fall as home - admitted 12/11-12/13.  He was noted to be symptomatic at his visit with shortness of breath and mild respiratory distress.  He was directly admitted to the hospital due to the need for further intervention.  05/03/2021-05/09/2021: Hospital admission for  recurrent right pleural effusion.  Right pigtail catheter placed in pleural space on 1221 with 2810 cc removed initially and total 4440 cc.  Discontinued 12 /26. Pleural fluid significant for low protein but high LDH, WBC 734 (61% lymphs, 12% eos, 12% mono). Negative cytology and culture.  Clinical status without evidence for pneumonia; however treated empirically with doxycycline x 5 days for possible subclinical pneumonia.  Previously discontinued HCTZ due to hyponatremia; however this appeared to be a chronic problem and did not have any evidence of SIADH based on urine osmolality and sodium. Follow-up outpatient with pulm and cards with CXR and labs.  05/18/2021: Today - hospital follow up Patient presents today with his wife and daughter for hospital follow up for large right pleural effusion requiring pigtail chest tube. Today, he reports complete resolution of his shortness of breath. He denies any SOB with exertion or at rest. He does continue to experience some fatigue and loss of interest in activities that he previously enjoyed. This has been ongoing to a few months but his fatigue worsened with his hospitalizations. He has some difficulties sleeping at night related to anxious thoughts and having to use the bathroom frequently. His wife does not report noticing any snoring and he denies drowsiness or morning headaches. He denies any cough, wheezing, orthopnea, PND, chest pain, or lower extremity swelling. He denies SI/HI. He was started on effexor recently but feels like his dose may need to be increased. He recently started working with PT, which he has enjoyed but he feels tired afterwards. Overall, his breathing is stable and he offers no further complaints.  CXR today with trace right pleural effusion, decreased compared to previous exam,  with right basilar atelectasis which has also improved. Left effusion resolved.   Allergies  Allergen Reactions   Duloxetine Hcl Other (See Comments)     withdrawal    Immunization History  Administered Date(s) Administered   Fluad Quad(high Dose 65+) 02/18/2021   PFIZER(Purple Top)SARS-COV-2 Vaccination 06/19/2019, 07/15/2019    Past Medical History:  Diagnosis Date   Bilateral carotid artery disease (HCC)    Severe Left and moderate Right   Diabetes mellitus without complication [facility])    ED (erectile dysfunction)    Hyperlipidemia    Hypertension    Lumbar disc disease    Mitral valve prolapse    moderate to severe MR, LVEF 55% Echo 2010   Moderate mitral regurgitation 03/16/2014   S/P mitral valve clip implantation 10/09/2019   s/p TEER using 2 MitraClip XTW devices, first device positioned A2 P2, second device positioned A1 P1 by Dr. Burt Knack    Tobacco History: Social History   Tobacco Use  Smoking Status Never  Smokeless Tobacco Never   Counseling given: Not Answered   Outpatient Medications Prior to Visit  Medication Sig Dispense Refill   aspirin 81 MG EC tablet Take 81 mg by mouth daily.     Cholecalciferol (VITAMIN D) 2000 UNITS tablet Take 2,000 Units by mouth daily at 12 noon.      Cyanocobalamin (VITAMIN B-12) 2000 MCG TBCR 1 tablet     finasteride (PROSCAR) 5 MG tablet Take 5 mg by mouth daily at 12 noon.      levothyroxine (SYNTHROID, LEVOTHROID) 75 MCG tablet Take 75 mcg by mouth daily before breakfast.     metFORMIN (GLUCOPHAGE) 500 MG tablet Take 500 mg by mouth every evening.      metoprolol tartrate (LOPRESSOR) 25 MG tablet Take 0.5 tablets (12.5 mg total) by mouth 2 (two) times daily. 30 tablet 0   pravastatin (PRAVACHOL) 20 MG tablet Take 20 mg by mouth at bedtime.      psyllium (METAMUCIL) 58.6 % packet Take 1 packet by mouth daily.     ramipril (ALTACE) 5 MG capsule Take 5 mg by mouth daily.      sodium chloride (OCEAN) 0.65 % SOLN nasal spray Place 1 spray into both nostrils daily as needed for congestion.     tamsulosin (FLOMAX) 0.4 MG CAPS capsule Take 0.4 mg by mouth daily at 12 noon.       Thiamine HCl (VITAMIN B1 PO) Take 125 mcg by mouth daily.     venlafaxine XR (EFFEXOR-XR) 75 MG 24 hr capsule Take 1 capsule (75 mg total) by mouth daily with breakfast.     No facility-administered medications prior to visit.     Review of Systems:   Constitutional: No weight loss or gain, night sweats, fevers, chills. +fatigue (chronic; no worsening) HEENT: No headaches, difficulty swallowing, tooth/dental problems, or sore throat. No sneezing, itching, ear ache, nasal congestion, or post nasal drip CV:  No chest pain, orthopnea, PND, swelling in lower extremities, anasarca, dizziness, palpitations, syncope Resp: No shortness of breath with exertion or at rest. No excess mucus or change in color of mucus. No productive or non-productive. No hemoptysis. No wheezing.  No chest wall deformity GI:  No heartburn, indigestion, abdominal pain, nausea, vomiting, diarrhea, change in bowel habits, loss of appetite, bloody stools.  GU: No dysuria, change in color of urine, urgency or frequency.  No flank pain, no hematuria  Skin: No rash, lesions, ulcerations MSK:  No joint pain or swelling.  No decreased range  of motion.  No back pain. Neuro: No dizziness or lightheadedness.  Psych: +depressed feelings; anhedonia; difficulties sleeping d/t anxious thoughts. No SI/HI.     Physical Exam:  BP 110/64 (BP Location: Left Arm, Patient Position: Sitting, Cuff Size: Normal)    Pulse 67    Temp (!) 97.4 F (36.3 C) (Oral)    Ht 5\' 9"  (1.753 m)    Wt 135 lb 6.4 oz (61.4 kg)    SpO2 97%    BMI 20.00 kg/m   GEN: Pleasant, interactive, well-nourished; in no acute distress. HEENT:  Normocephalic and atraumatic. EACs patent bilaterally. TM pearly gray with present light reflex bilaterally. PERRLA. Sclera white. Nasal turbinates pink, moist and patent bilaterally. No rhinorrhea present. Oropharynx pink and moist, without exudate or edema. No lesions, ulcerations, or postnasal drip.  NECK:  Supple w/ fair ROM.  No JVD present. Normal carotid impulses w/o bruits. Thyroid symmetrical with no goiter or nodules palpated. No lymphadenopathy.   CV: RRR, no m/r/g, no peripheral edema. Pulses intact, +2 bilaterally. No cyanosis, pallor or clubbing. PULMONARY:  Unlabored, regular breathing. Clear bilaterally A&P w/o wheezes/rales/rhonchi. No accessory muscle use. No dullness to percussion. GI: BS present and normoactive. Soft, non-tender to palpation. No organomegaly or masses detected. No CVA tenderness. MSK: No erythema, warmth or tenderness. Cap refil <2 sec all extrem. No deformities or joint swelling noted.  Neuro: A/Ox3. No focal deficits noted.   Skin: Warm, no lesions or rashe Psych: Normal affect and behavior. Judgement and thought content appropriate.     Lab Results:  CBC    Component Value Date/Time   WBC 8.4 05/05/2021 0343   RBC 3.65 (L) 05/05/2021 0343   HGB 11.7 (L) 05/05/2021 0343   HGB 12.7 (L) 09/08/2019 1121   HCT 34.3 (L) 05/05/2021 0343   HCT 36.8 (L) 09/08/2019 1121   PLT 319 05/05/2021 0343   PLT 191 09/08/2019 1121   MCV 94.0 05/05/2021 0343   MCV 92 09/08/2019 1121   MCH 32.1 05/05/2021 0343   MCHC 34.1 05/05/2021 0343   RDW 13.6 05/05/2021 0343   RDW 11.8 09/08/2019 1121   LYMPHSABS 2.2 05/03/2021 2141   MONOABS 0.8 05/03/2021 2141   EOSABS 0.2 05/03/2021 2141   BASOSABS 0.1 05/03/2021 2141    BMET    Component Value Date/Time   NA 128 (L) 05/07/2021 0334   NA 126 (L) 04/11/2021 1421   K 4.0 05/07/2021 0334   CL 97 (L) 05/07/2021 0334   CO2 24 05/07/2021 0334   GLUCOSE 133 (H) 05/07/2021 0334   BUN 16 05/07/2021 0334   BUN 21 04/11/2021 1421   CREATININE 0.93 05/07/2021 0334   CALCIUM 8.2 (L) 05/07/2021 0334   GFRNONAA >60 05/07/2021 0334   GFRAA >60 10/10/2019 0203    BNP    Component Value Date/Time   BNP 245.8 (H) 10/07/2019 1031     Imaging:  DG Chest 2 View  Result Date: 05/18/2021 CLINICAL DATA:  Right pleural effusion. EXAM: CHEST - 2  VIEW COMPARISON:  Chest radiograph dated May 09, 2021 FINDINGS: The heart size and mediastinal contours are within normal limits. Right basilar opacity likely representing atelectasis. Trace right pleural effusion, which appears to have decreased since prior examination. Osteopenia with mild thoracic kyphosis. IMPRESSION: 1. Trace right pleural effusion, which appears to have decreased since prior examination. Right basilar atelectasis. No appreciable pneumothorax. Electronically Signed   By: Keane Police D.O.   On: 05/18/2021 14:51   DG Chest 2 View  Result Date: 05/04/2021 CLINICAL DATA:  Pleural effusion EXAM: CHEST - 2 VIEW COMPARISON:  04/26/2021 FINDINGS: Upper normal heart size with pulmonary vascular congestion. Mediastinal contours normal. Atherosclerotic calcification aorta. Significant increase in RIGHT pleural effusion since previous exam with increased RIGHT basilar atelectasis. Small loculated hydropneumothorax in posterior mid RIGHT chest with air-fluid level. Small LEFT pleural effusion and mild LEFT basilar atelectasis, slightly increased. No definite acute infiltrate. Persistent RIGHT apex pneumothorax, slightly increased. Osseous structures unremarkable. IMPRESSION: BILATERAL pleural effusions and basilar atelectasis RIGHT greater than LEFT, significantly increased on RIGHT and mildly increased on LEFT since previous exam. Small RIGHT apex pneumothorax, slightly increased. Small loculated hydropneumothorax posterior mid RIGHT chest. Critical Value/emergent results were called by telephone at the time of interpretation on 05/04/2021 at 11:06 am to provider Raquel Sarna RN at Dr. Erin Fulling, Who verbally acknowledged these results. Electronically Signed   By: Lavonia Dana M.D.   On: 05/04/2021 11:15   CT HEAD WO CONTRAST  Result Date: 04/24/2021 CLINICAL DATA:  88 year old male new onset seizure. Became unresponsive. EXAM: CT HEAD WITHOUT CONTRAST TECHNIQUE: Contiguous axial images were obtained  from the base of the skull through the vertex without intravenous contrast. COMPARISON:  None. FINDINGS: Brain: Cerebral volume is within normal limits for age. No midline shift, ventriculomegaly, mass effect, evidence of mass lesion, intracranial hemorrhage or evidence of cortically based acute infarction. Mild dystrophic calcifications bilateral basal ganglia. Gray-white matter differentiation within normal limits for age. No cortical encephalomalacia identified. Vascular: Calcified atherosclerosis at the skull base. No suspicious intracranial vascular hyperdensity. Skull: No fracture identified. Sinuses/Orbits: Visualized paranasal sinuses and mastoids are well aerated. Other: No discrete orbit or scalp soft tissue injury identified. IMPRESSION: Normal for age non contrast CT appearance of the brain. No acute traumatic injury identified. Electronically Signed   By: Genevie Ann M.D.   On: 04/24/2021 07:39   CT CHEST WO CONTRAST  Result Date: 05/06/2021 CLINICAL DATA:  Chronic dyspnea, increasing shortness of breath in the setting of rib fractures and pleural effusion, traumatic hemothorax post fall with subsequent RIGHT thoracentesis removing 1400 mL of fluid EXAM: CT CHEST WITHOUT CONTRAST TECHNIQUE: Multidetector CT imaging of the chest was performed following the standard protocol without IV contrast. COMPARISON:  04/24/2021 FINDINGS: Cardiovascular: Atherosclerotic calcifications aorta, proximal great vessels, and coronary arteries. Ascending thoracic aorta upper normal caliber. Post MVR. Slight enlargement of cardiac chambers. Trace pericardial effusion. Mediastinum/Nodes: Esophagus unremarkable. Base of cervical region normal appearance. No thoracic adenopathy. Lungs/Pleura: Pigtail RIGHT thoracostomy tube. Small RIGHT pneumothorax despite thoracostomy tube, as noted on radiograph. BILATERAL pleural effusions and compressive atelectasis of the lower lobes, greater on LEFT. Patchy infiltrates RIGHT middle  lobe and to lesser degree RIGHT lower lobe. Upper Abdomen: No upper abdominal abnormalities. Musculoskeletal: Osseous demineralization. Sternum and vertebra intact. Fractures of RIGHT 6 seventh eighth ninth tenth and eleventh ribs, with fractures at 2 sites in the majority of these ribs. IMPRESSION: RIGHT thoracostomy tube with small RIGHT pneumothorax despite thoracostomy tube. BILATERAL pleural effusions and compressive atelectasis of the lower lobes, greater on LEFT. Patchy infiltrates RIGHT lung in RIGHT middle and RIGHT lower lobes. Extensive atherosclerotic calcifications including coronary arteries. Multiple fractures of the RIGHT sixth through eleventh ribs. Aortic Atherosclerosis (ICD10-I70.0). Electronically Signed   By: Lavonia Dana M.D.   On: 05/06/2021 16:33   CT Angio Chest PE W and/or Wo Contrast  Result Date: 04/24/2021 CLINICAL DATA:  Possible pulmonary embolism. Syncope at home when getting up to go to the bathroom. EXAM: CT ANGIOGRAPHY CHEST  CT ABDOMEN AND PELVIS WITH CONTRAST TECHNIQUE: Multidetector CT imaging of the chest was performed using the standard protocol during bolus administration of intravenous contrast. Multiplanar CT image reconstructions and MIPs were obtained to evaluate the vascular anatomy. Multidetector CT imaging of the abdomen and pelvis was performed using the standard protocol during bolus administration of intravenous contrast. CONTRAST:  170mL OMNIPAQUE IOHEXOL 350 MG/ML SOLN COMPARISON:  CT abdomen/pelvis 10/08/2008 FINDINGS: CTA CHEST FINDINGS Cardiovascular: Mild-to-moderate cardiomegaly. Prosthetic mitral valve noted. Mild calcified plaque over the left main and right coronary arteries. Ascending thoracic aorta measures approximately 3.6 cm in AP diameter. Calcified plaque over the descending thoracic aorta. Pulmonary arterial system is well opacified and demonstrates no evidence of emboli. Mediastinum/Nodes: No definite mediastinal or hilar adenopathy. Remaining  mediastinal structures are unremarkable. Lungs/Pleura: Moderate moderate to large right pleural fluid collection and small to moderate left pleural fluid collection with associated atelectatic change within the lung bases. No pneumothorax. Airways are unremarkable. Musculoskeletal: Numerous right posterior and lateral rib fractures involving ribs 6 through 11. Review of the MIP images confirms the above findings. CT ABDOMEN and PELVIS FINDINGS Hepatobiliary: Liver and biliary tree are normal. Tiny amount of adjacent pericholecystic fluid as the gallbladder is unremarkable. Pancreas: Normal. Spleen: Normal. Adrenals/Urinary Tract: Adrenal glands are normal. Kidneys are normal in size without focal mass or hydronephrosis. Ureters and bladder are normal. Stomach/Bowel: Stomach and small bowel are normal. Diverticulosis of the colon with mild fecal retention throughout the colon. Appendix not visualized. Vascular/Lymphatic: Mild-to-moderate calcified plaque over the abdominal aorta which is normal in caliber. No adenopathy. Reproductive: Mild prostatic enlargement with moderate pressure upon the bladder base. Other: No significant free peritoneal fluid and no free peritoneal air. Small right inguinal hernia containing peritoneal fat and tiny amount of fluid. Musculoskeletal: Degenerative change of the spine. No acute fracture. Review of the MIP images confirms the above findings. IMPRESSION: 1. No evidence of pulmonary embolism. 2. Moderate to large right pleural fluid collection and small to moderate left pleural fluid collection with associated bibasilar atelectasis. No pneumothorax. Multiple associated mildly displaced and slightly comminuted fractures of posterolateral right ribs 6 through 11. 3. No acute findings in the abdomen/pelvis. 4. Colonic diverticulosis without active inflammation. 5. Mild prostatic enlargement with moderate impression upon the bladder base. 6. Small right inguinal hernia containing  peritoneal fat and tiny amount of fluid. 7. Aortic atherosclerosis. Atherosclerotic coronary artery disease. Aortic Atherosclerosis (ICD10-I70.0). Electronically Signed   By: Marin Olp M.D.   On: 04/24/2021 09:09   MR BRAIN WO CONTRAST  Result Date: 04/24/2021 CLINICAL DATA:  Stroke follow-up EXAM: MRI HEAD WITHOUT CONTRAST TECHNIQUE: Multiplanar, multiecho pulse sequences of the brain and surrounding structures were obtained without intravenous contrast. COMPARISON:  CT head 04/24/2021 FINDINGS: Brain: No acute infarction, hemorrhage, hydrocephalus, extra-axial collection or mass lesion. Minimal white matter changes. Mild cerebral atrophy. Vascular: Normal arterial flow voids at the skull base. Skull and upper cervical spine: No focal skeletal lesion. Sinuses/Orbits: Paranasal sinuses clear. Bilateral cataract extraction Other: None IMPRESSION: No acute abnormality. Electronically Signed   By: Franchot Gallo M.D.   On: 04/24/2021 17:47   CT ABDOMEN PELVIS W CONTRAST  Result Date: 04/24/2021 CLINICAL DATA:  Possible pulmonary embolism. Syncope at home when getting up to go to the bathroom. EXAM: CT ANGIOGRAPHY CHEST CT ABDOMEN AND PELVIS WITH CONTRAST TECHNIQUE: Multidetector CT imaging of the chest was performed using the standard protocol during bolus administration of intravenous contrast. Multiplanar CT image reconstructions and MIPs were obtained to  evaluate the vascular anatomy. Multidetector CT imaging of the abdomen and pelvis was performed using the standard protocol during bolus administration of intravenous contrast. CONTRAST:  18mL OMNIPAQUE IOHEXOL 350 MG/ML SOLN COMPARISON:  CT abdomen/pelvis 10/08/2008 FINDINGS: CTA CHEST FINDINGS Cardiovascular: Mild-to-moderate cardiomegaly. Prosthetic mitral valve noted. Mild calcified plaque over the left main and right coronary arteries. Ascending thoracic aorta measures approximately 3.6 cm in AP diameter. Calcified plaque over the descending  thoracic aorta. Pulmonary arterial system is well opacified and demonstrates no evidence of emboli. Mediastinum/Nodes: No definite mediastinal or hilar adenopathy. Remaining mediastinal structures are unremarkable. Lungs/Pleura: Moderate moderate to large right pleural fluid collection and small to moderate left pleural fluid collection with associated atelectatic change within the lung bases. No pneumothorax. Airways are unremarkable. Musculoskeletal: Numerous right posterior and lateral rib fractures involving ribs 6 through 11. Review of the MIP images confirms the above findings. CT ABDOMEN and PELVIS FINDINGS Hepatobiliary: Liver and biliary tree are normal. Tiny amount of adjacent pericholecystic fluid as the gallbladder is unremarkable. Pancreas: Normal. Spleen: Normal. Adrenals/Urinary Tract: Adrenal glands are normal. Kidneys are normal in size without focal mass or hydronephrosis. Ureters and bladder are normal. Stomach/Bowel: Stomach and small bowel are normal. Diverticulosis of the colon with mild fecal retention throughout the colon. Appendix not visualized. Vascular/Lymphatic: Mild-to-moderate calcified plaque over the abdominal aorta which is normal in caliber. No adenopathy. Reproductive: Mild prostatic enlargement with moderate pressure upon the bladder base. Other: No significant free peritoneal fluid and no free peritoneal air. Small right inguinal hernia containing peritoneal fat and tiny amount of fluid. Musculoskeletal: Degenerative change of the spine. No acute fracture. Review of the MIP images confirms the above findings. IMPRESSION: 1. No evidence of pulmonary embolism. 2. Moderate to large right pleural fluid collection and small to moderate left pleural fluid collection with associated bibasilar atelectasis. No pneumothorax. Multiple associated mildly displaced and slightly comminuted fractures of posterolateral right ribs 6 through 11. 3. No acute findings in the abdomen/pelvis. 4.  Colonic diverticulosis without active inflammation. 5. Mild prostatic enlargement with moderate impression upon the bladder base. 6. Small right inguinal hernia containing peritoneal fat and tiny amount of fluid. 7. Aortic atherosclerosis. Atherosclerotic coronary artery disease. Aortic Atherosclerosis (ICD10-I70.0). Electronically Signed   By: Marin Olp M.D.   On: 04/24/2021 09:09   DG Chest Port 1 View  Result Date: 05/09/2021 CLINICAL DATA:  Pleural effusion on the right EXAM: PORTABLE CHEST 1 VIEW COMPARISON:  09/07/2020 FINDINGS: Interval removal of right chest tube. Tiny residual right apical pneumothorax. Small to moderate bilateral layering pleural effusions, increasing on the right since prior study, stable on the left. Bibasilar atelectasis or infiltrates, also increasing on the right since prior study. Heart is borderline in size. IMPRESSION: Interval removal of right chest tube with stable small right apical pneumothorax. Small to moderate layering bilateral effusions and bibasilar opacities, both increased on the right since prior study. Electronically Signed   By: Rolm Baptise M.D.   On: 05/09/2021 15:11   DG Chest Port 1 View  Result Date: 05/09/2021 CLINICAL DATA:  Right chest tube, right pleural effusion EXAM: PORTABLE CHEST 1 VIEW COMPARISON:  05/07/2021. FINDINGS: Tiny right apical pneumothorax, unchanged. Stable right basilar pigtail drain. Small left pleural effusion. Trace right pleural effusion. Pulmonary vascular congestion without frank interstitial edema. Cardiomegaly. Overall, this appears unchanged. IMPRESSION: Tiny right apical pneumothorax with indwelling right basilar pigtail drain, unchanged. Small left pleural effusion and trace right pleural effusion. Electronically Signed   By: Bertis Ruddy  [place] Pink M.D.   On: 05/09/2021 08:21   DG Chest Port 1 View  Result Date: 05/07/2021 CLINICAL DATA:  Pleural effusion EXAM: PORTABLE CHEST 1 VIEW COMPARISON:  05/06/2021 FINDINGS:  Cardiomegaly. Right chest tube remains in place. Small right apical pneumothorax, similar to prior chest CT. Vascular congestion with interstitial prominence, favor interstitial edema. Focal left lower lobe airspace opacity, stable. Small bilateral effusions. IMPRESSION: Right chest tube remains in place with small right apical pneumothorax, unchanged since prior CT. Cardiomegaly, vascular congestion, possibly early interstitial edema. Small bilateral effusions. Left lower lobe atelectasis or infiltrate. Electronically Signed   By: Rolm Baptise M.D.   On: 05/07/2021 06:41   DG Chest Port 1 View  Result Date: 05/06/2021 CLINICAL DATA:  Right-sided pleural effusion, status post drain EXAM: PORTABLE CHEST 1 VIEW COMPARISON:  1 day prior FINDINGS: Two frontal views of the chest. The right-sided pigtail pleural drain remains in place. Remote bilateral rib fractures. Midline trachea. Mild cardiomegaly. Small to moderate left pleural effusion, similar. The right-sided pneumothorax is slightly increased, on the order of 10-15%. Mild interstitial edema. Improved right infrahilar and persistent left lower lobe airspace disease. IMPRESSION: Right-sided pleural drain remaining in place with increase in approximately 10-15% superolateral right pneumothorax. Congestive heart failure. Persistent left pleural effusion with adjacent atelectasis or pneumonia. Slightly improved right infrahilar and lung base airspace disease. Electronically Signed   By: Abigail Miyamoto M.D.   On: 05/06/2021 14:54   DG CHEST PORT 1 VIEW  Result Date: 05/05/2021 CLINICAL DATA:  Pleural effusion. EXAM: PORTABLE CHEST 1 VIEW COMPARISON:  Radiographs 05/03/2021 and 04/26/2021.  CT 04/24/2021. FINDINGS: 1106 hours. Two views are submitted. There is a small caliber pigtail pleural catheter drain on the right with interval marked decrease in size of the right pleural effusion. Small residual right apical pneumothorax is unchanged. There is a persistent  small left pleural effusion and bibasilar airspace opacities. The heart size and mediastinal contours are stable. Mitral valve prosthesis noted. IMPRESSION: 1. Interval marked decrease in size of right pleural effusion following right pleural drain catheter placement. Residual small right apical pneumothorax is unchanged. 2. Persistent small left pleural effusion and bibasilar airspace opacities. Electronically Signed   By: Richardean Sale M.D.   On: 05/05/2021 13:40   DG CHEST PORT 1 VIEW  Result Date: 04/26/2021 CLINICAL DATA:  Shortness of breath, status post thoracentesis EXAM: PORTABLE CHEST 1 VIEW COMPARISON:  Previous studies including the examination of 04/25/2021 FINDINGS: Transverse diameter of heart is increased. There is prosthetic device in the region of mitral valve. Central pulmonary vessels are prominent. There are no signs of alveolar pulmonary edema. There is blunting of both lateral CP angles. Undisplaced fracture is seen in the posterolateral aspect of right ninth rib. Increased markings are seen in the lower lung fields with improvement in aeration in the left lower lung fields. There are no new focal pulmonary infiltrates. There is small right apical pneumothorax. IMPRESSION: Cardiomegaly. There are patchy infiltrates in both lower lung fields suggesting atelectasis/pneumonia with interval improvement in the aeration in the left lower lung fields. Small bilateral pleural effusions. There is minimal right apical pneumothorax. Electronically Signed   By: Elmer Picker M.D.   On: 04/26/2021 09:29   DG CHEST PORT 1 VIEW  Result Date: 04/25/2021 CLINICAL DATA:  Status post thoracentesis EXAM: PORTABLE CHEST 1 VIEW COMPARISON:  04/24/2021 chest CT angiogram FINDINGS: Stable cardiomediastinal silhouette with mild cardiomegaly. No pneumothorax. Residual small right pleural effusion, substantially decreased. Stable small left pleural  effusion. No overt pulmonary edema. Patchy bibasilar  lung opacities, decreased on the right and stable on the left. Redemonstration of posterolateral right lower rib fracture. IMPRESSION: 1. No pneumothorax. Residual small right pleural effusion, substantially decreased. 2. Stable small left pleural effusion. 3. Patchy bibasilar lung opacities, decreased on the right and stable on the left, favor atelectasis. 4. Stable cardiomegaly without overt pulmonary edema. Electronically Signed   By: Ilona Sorrel M.D.   On: 04/25/2021 15:24   EEG adult  Result Date: 04/26/2021 Lora Havens, MD     04/26/2021 11:00 AM Patient Name: LAVERNE KLUGH MRN: [id number] Epilepsy Attending: Lora Havens Referring Physician/Provider: Dr Marzetta Board Date: 04/26/2021 Duration: 23.23 mins Patient history: 88 year old male with an episode of unresponsiveness/syncope.  EEG evaluate for seizures. Level of alertness: Awake, drowsy AEDs during EEG study: None Technical aspects: This EEG study was done with scalp electrodes positioned according to the 10-20 International system of electrode placement. Electrical activity was acquired at a sampling rate of 500Hz  and reviewed with a high frequency filter of 70Hz  and a low frequency filter of 1Hz . EEG data were recorded continuously and digitally stored. Description: The posterior dominant rhythm consists of 8-9 Hz activity of moderate voltage (25-35 uV) seen predominantly in posterior head regions, symmetric and reactive to eye opening and eye closing. Drowsiness was characterized by attenuation of the posterior background rhythm. Hyperventilation and photic stimulation were not performed.   IMPRESSION: This study is within normal limits. No seizures or epileptiform discharges were seen throughout the recording. Lora Havens   ECHOCARDIOGRAM COMPLETE  Result Date: 04/26/2021    ECHOCARDIOGRAM REPORT   Patient Name:   BRYSUN ESCHMANN [facility] Date of Exam: 04/26/2021 Medical Rec #:  [id number]      Height:       69.0 in Accession #:     [id number]     Weight:       140.4 lb Date of Birth:  Apr 28, 1931     BSA:          1.778 m Patient Age:    19 years       BP:           123/74 mmHg Patient Gender: M              HR:           58 bpm. Exam Location:  Inpatient Procedure: 2D Echo, 3D Echo, Cardiac Doppler and Color Doppler Indications:    R94.31 Abnormal EKG  History:        Patient has prior history of Echocardiogram examinations, most                 recent 12/01/2020. Abnormal ECG, Mitral Valve Prolapse and Mitral                 Valve Disease, Signs/Symptoms:Hypotension; Risk                 Factors:Hypertension and Dyslipidemia. Severe mitral                 regurgitation. Post mitra clip.                  Mitral Valve: Mitra-Clip valve is present in the mitral                 position. Procedure Date: 10/09/2019.  Sonographer:    Roseanna Rainbow RDCS Referring Phys: [id number] [place]  1. Left ventricular ejection fraction,  by estimation, is 55 to 60%. Left ventricular ejection fraction by 3D volume is 56 %. The left ventricle has normal function. The left ventricle has no regional wall motion abnormalities. There is mild concentric left ventricular hypertrophy. Left ventricular diastolic function could not be evaluated. Elevated left atrial pressure.  2. Right ventricular systolic function is normal. The right ventricular size is normal. Tricuspid regurgitation signal is inadequate for assessing PA pressure.  3. Left atrial size was moderately dilated.  4. Right atrial size was moderately dilated.  5. Moderate pleural effusion in the left lateral region.  6. The mitral valve has been repaired/replaced. Moderate mitral valve regurgitation. The mean mitral valve gradient is 4.0 mmHg with average heart rate of 58 bpm. There is a Mitra-Clip present in the mitral position. Procedure Date: 10/09/2019.  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  8. The aortic valve is tricuspid. There is moderate calcification of the aortic valve. There is  moderate thickening of the aortic valve. Aortic valve regurgitation is mild.  9. The inferior vena cava is dilated in size with <50% respiratory variability, suggesting right atrial pressure of 15 mmHg. Comparison(s): No significant change from prior study. Prior images reviewed side by side. FINDINGS  Left Ventricle: Left ventricular ejection fraction, by estimation, is 55 to 60%. Left ventricular ejection fraction by 3D volume is 56 %. The left ventricle has normal function. The left ventricle has no regional wall motion abnormalities. The left ventricular internal cavity size was normal in size. There is mild concentric left ventricular hypertrophy. Left ventricular diastolic function could not be evaluated due to mitral valve repair. Left ventricular diastolic function could not be evaluated.  Elevated left atrial pressure. Right Ventricle: The right ventricular size is normal. No increase in right ventricular wall thickness. Right ventricular systolic function is normal. Tricuspid regurgitation signal is inadequate for assessing PA pressure. Left Atrium: Left atrial size was moderately dilated. Right Atrium: Right atrial size was moderately dilated. Pericardium: There is no evidence of pericardial effusion. Mitral Valve: The mitral valve has been repaired/replaced. There is moderate thickening of the mitral valve leaflet(s). Moderate mitral valve regurgitation, with eccentric anteriorly directed jet. There is a Mitra-Clip present in the mitral position. Procedure Date: 10/09/2019. MV peak gradient, 20.1 mmHg. The mean mitral valve gradient is 4.0 mmHg with average heart rate of 58 bpm. Tricuspid Valve: The tricuspid valve is myxomatous. Tricuspid valve regurgitation is trivial. Aortic Valve: The aortic valve is tricuspid. There is moderate calcification of the aortic valve. There is moderate thickening of the aortic valve. Aortic valve regurgitation is mild. Aortic regurgitation PHT measures 890 msec. Pulmonic  Valve: The pulmonic valve was grossly normal. Pulmonic valve regurgitation is not visualized. Aorta: The aortic root and ascending aorta are structurally normal, with no evidence of dilitation. Venous: The inferior vena cava is dilated in size with less than 50% respiratory variability, suggesting right atrial pressure of 15 mmHg. IAS/Shunts: No atrial level shunt detected by color flow Doppler. Additional Comments: There is a moderate pleural effusion in the left lateral region.  LEFT VENTRICLE PLAX 2D LVIDd:         5.00 cm         Diastology LVIDs:         3.40 cm         LV e' medial:    5.10 cm/s LV PW:         1.40 cm         LV E/e' medial:  33.7  LV IVS:        1.20 cm         LV e' lateral:   4.80 cm/s LVOT diam:     2.10 cm         LV E/e' lateral: 35.8 LV SV:         54 LV SV Index:   30 LVOT Area:     3.46 cm        3D Volume EF                                LV 3D EF:    Left                                             ventricul LV Volumes (MOD)                            ar LV vol d, MOD    127.0 ml                   ejection A2C:                                        fraction LV vol d, MOD    134.0 ml                   by 3D A4C:                                        volume is LV vol s, MOD    64.3 ml                    56 %. A2C: LV vol s, MOD    51.5 ml A4C:                           3D Volume EF: LV SV MOD A2C:   62.7 ml       3D EF:        56 % LV SV MOD A4C:   134.0 ml      LV EDV:       193 ml LV SV MOD BP:    78.8 ml       LV ESV:       84 ml                                LV SV:        108 ml RIGHT VENTRICLE             IVC RV S prime:     10.30 cm/s  IVC diam: 2.40 cm TAPSE (M-mode): 2.2 cm LEFT ATRIUM             Index        RIGHT ATRIUM           Index LA diam:        4.80 cm 2.70  cm/m   RA Area:     23.10 cm LA Vol (A2C):   93.4 ml 52.54 ml/m  RA Volume:   71.70 ml  40.33 ml/m LA Vol (A4C):   76.0 ml 42.75 ml/m LA Biplane Vol: 89.5 ml 50.34 ml/m  AORTIC VALVE LVOT Vmax:   76.40  cm/s LVOT Vmean:  52.000 cm/s LVOT VTI:    0.155 m AI PHT:      890 msec  AORTA Ao Root diam: 3.20 cm Ao Asc diam:  3.60 cm MITRAL VALVE MV Area (PHT): 1.78 cm       SHUNTS MV Area VTI:   0.87 cm       Systemic VTI:  0.16 m MV Peak grad:  20.1 mmHg      Systemic Diam: 2.10 cm MV Mean grad:  4.0 mmHg MV Vmax:       2.24 m/s MV Vmean:      85.1 cm/s MV Decel Time: 426 msec MR Peak grad:    64.3 mmHg MR Mean grad:    34.0 mmHg MR Vmax:         401.00 cm/s MR Vmean:        264.0 cm/s MR PISA:         18.16 cm MR PISA Eff ROA: 153 mm MR PISA Radius:  1.70 cm MV E velocity: 172.00 cm/s MV A velocity: 65.00 cm/s MV E/A ratio:  2.65 Mihai Croitoru MD Electronically signed by Sanda Klein MD Signature Date/Time: 04/26/2021/2:53:41 PM    Final    CT IMAGE GUIDED DRAINAGE BY PERCUTANEOUS CATHETER  Result Date: 05/05/2021 INDICATION: Recurrent pleural effusion EXAM: CT-GUIDED CHEST TUBE PLACEMENT MEDICATIONS: No periprocedural antibiotics were indicated ANESTHESIA/SEDATION: Intravenous Fentanyl 8mcg administered for pain control during continuous monitoring of the patient's level of consciousness and physiological / cardiorespiratory status by the radiology RN. COMPLICATIONS: None immediate. PROCEDURE: Informed written consent was obtained from the patient after a thorough discussion of the procedural risks, benefits and alternatives. All questions were addressed. Maximal Sterile Barrier Technique was utilized including caps, mask, sterile gowns, sterile gloves, sterile drape, hand hygiene and skin antiseptic. A timeout was performed prior to the initiation of the procedure. Select scans through the thorax obtained. Appropriate skin entry site was determined and marked. Region was prepped with chlorhexidine, draped in usual sterile fashion, infiltrated locally with 1% lidocaine. Percutaneous entry needle was advanced into the pleural space. Thin fluid could be aspirated. Amplatz guidewire advanced easily. Tract  dilated to facilitate placement of a 16 French pigtail drain catheter. Catheter was placed to Pleur-Evac suction device. Confirmatory CT demonstrated good catheter position. The catheter was secured externally with 0 Prolene suture and StatLock and a sterile dressing applied. The patient tolerated the procedure well. IMPRESSION: 1. Technically successful CT-guided right chest tube placement. Electronically Signed   By: Lucrezia Europe M.D.   On: 05/05/2021 07:43   LONG TERM MONITOR (3-14 DAYS)  Result Date: 04/26/2021  Basic underlying rhythm is NSR  60 Self-terminating SVT< 165 bpm with longest 18 seconds and fastest lasting only 4 beats.  Frequent PVC's with burden 13.7%  One 6 beat non sustained VT at 148 bpm  No atrial fibrillation  Patch Wear Time:  2 days and 23 hours (2022-12-01T15:16:48-0500 to 2022-12-04T15:12:06-498) Patient had a min HR of 55 bpm, max HR of 164 bpm, and avg HR of 74 bpm. Predominant underlying rhythm was Sinus Rhythm. 1 run of Ventricular Tachycardia occurred lasting 6 beats with a max rate of 148 bpm (  avg 108 bpm). 60 Supraventricular Tachycardia runs occurred, the run with the fastest interval lasting 4 beats with a max rate of 164 bpm, the longest lasting 18.1 secs with an avg rate of 103 bpm. Isolated SVEs were occasional (1.7%, 5113), SVE Couplets were rare (<1.0%, 759), and SVE Triplets were  rare (<1.0%, 188). Isolated VEs were frequent (13.7%, 41885), VE Couplets were occasional (1.3%, 1999), and VE Triplets were rare (<1.0%, 88). Ventricular Bigeminy and Trigeminy were present.      No flowsheet data found.  No results found for: NITRICOXIDE      Assessment & Plan:   Recurrent right pleural effusion Clinically improved. CXR improving with trace right pleural effusion, decreased in size, and resolved left pleural effusion. Discussed return precautions. Activity encouraged, as tolerated - continue working with PT. No respiratory/pulm symptoms since  hospitalization. Walking oximetry today with SpO2 low of 93% and without any SOB/difficulties. Discuss diuretic therapy with cardiology at visit on 1/11. Suspect fatigue multifactorial related to deconditioning from recent hospitalizations, CHF/valve dysfunction, and depression.   Patient Instructions  Chest x ray today showed resolution of your left pleural effusion and a significant decrease in your right. Please notify us if you develop shortness of breath, cough, or if your fatigue worsens.  Follow up with cardiology with BMET to determine appropriate next steps for diuretic therapy.  Follow up with your primary care provider regarding your depression symptoms and discuss making a change to your current regimen.  Walking oximetry today 93%, which is good!  Follow up in one month with Dr. Erin Fulling. If symptoms do not improve or worsen, please contact office for sooner follow up or seek emergency care.   Nonrheumatic mitral (valve) insufficiency Follow up with cardiology as scheduled. Discuss diuretic therapy.   Depression No SI/HI. Anhedonia and fatigue present. Following up with PCP next week to discuss Effexor dosing.  Fatigue Suspect multifactorial related to CHF/valvular dysfunction, depression, and deconditioning. Continue working with PT to build strength back after hospitalization. Follow up with cards and PCP, as scheduled. See above.    Clayton Bibles, NP 05/18/2021  Pt aware and understands NP's role.

## 2021-05-18 NOTE — Assessment & Plan Note (Signed)
Suspect multifactorial related to CHF/valvular dysfunction, depression, and deconditioning. Continue working with PT to build strength back after hospitalization. Follow up with cards and PCP, as scheduled. See above.

## 2021-05-18 NOTE — Assessment & Plan Note (Signed)
Clinically improved. CXR improving with trace right pleural effusion, decreased in size, and resolved left pleural effusion. Discussed return precautions. Activity encouraged, as tolerated - continue working with PT. No respiratory/pulm symptoms since hospitalization. Walking oximetry today with SpO2 low of 93% and without any SOB/difficulties. Discuss diuretic therapy with cardiology at visit on 1/11. Suspect fatigue multifactorial related to deconditioning from recent hospitalizations, CHF/valve dysfunction, and depression.   Patient Instructions  Chest x ray today showed resolution of your left pleural effusion and a significant decrease in your right. Please notify us if you develop shortness of breath, cough, or if your fatigue worsens.  Follow up with cardiology with BMET to determine appropriate next steps for diuretic therapy.  Follow up with your primary care provider regarding your depression symptoms and discuss making a change to your current regimen.  Walking oximetry today 93%, which is good!  Follow up in one month with Dr. Erin Fulling. If symptoms do not improve or worsen, please contact office for sooner follow up or seek emergency care.

## 2021-05-18 NOTE — Assessment & Plan Note (Signed)
No SI/HI. Anhedonia and fatigue present. Following up with PCP next week to discuss Effexor dosing.

## 2021-05-18 NOTE — Assessment & Plan Note (Addendum)
Follow up with cardiology as scheduled. Discuss diuretic therapy.

## 2021-05-23 DIAGNOSIS — E871 Hypo-osmolality and hyponatremia: Secondary | ICD-10-CM | POA: Diagnosis not present

## 2021-05-24 NOTE — Progress Notes (Signed)
Cardiology Office Note:    Date:  05/24/2021   ID:  Adrian Olson, DOB [birth year], MRN [id number]  PCP:  Merrilee Seashore, MD   [facility] Providers Cardiologist:  [name], MD      Referring MD: Merrilee Seashore, [facility] follow-up right recurrent pleural effusion  History of Present Illness:    Adrian Olson is a 88 y.o. male with a hx of carotid artery stenosis, diabetes mellitus type 2, hyperlipidemia, HTN, lumbar disc disease, and mitral valve prolapse status post mitral valve clip.  He was admitted to the hospital on 05/03/2021 and discharged on 05/09/2021.  He was noted to have recurrent right pleural effusion.  A pigtail catheter was placed in his right pleural space 12/21.  2810 mL of fluid were initially removed and a total of 4440 mL total were pulled from the space.  His cytology and culture were negative.  He was treated with empiric antibiotics, doxycycline x5 days for possible subclinical pneumonia.  His HCTZ was previously discontinued due to hyponatremia however, it was felt to be a chronic problem.  There was no evidence of SIADH based off of his urine osmolality and sodium.  Follow-up with pulmonology and cardiology with repeat labs and chest x-ray were recommended.  He followed up with pulmonology 05/18/2021.  He presented with his wife and daughter.  He denied shortness of breath.  He denied increased work of breathing with exertion and at rest.  He continued to have some fatigue.  His fatigue has been ongoing for a few months but had worsened due to his hospitalizations.  They reported difficulty sleeping related to bathroom frequency and anxious thoughts.  His wife denied snoring.  He denied cough, orthopnea, PND, chest pain, and lower extremity edema.  He was previously started on Effexor and felt that his dose may need to be increased.  He was working with physical therapy which she was enjoying.  He was felt to be stable and had no further  complaints at the time of the visit.  His chest x-ray showed trace right pleural effusion which was decreased from his previous x-ray and his left effusion had resolved.  He presents to the clinic today for follow-up evaluation and states he continues to improve each day.  He has been working with occupational therapy and physical therapy.  He reports that he walked outside with occupational therapy today.  They did note a lower readings with his pulse oximetry meter.  He quickly rebounded into the mid 90s with rest.  He feels that his energy is returning.  His appetite is returned.  His blood pressure is well controlled and his heart rate has stayed in the 60-70 range.  He continues to work with his incentive spirometer and do deep breathing on his own.  We reviewed his current medications and I will refill his metoprolol.  We discussed his recently discontinued hydrochlorothiazide.  We also reviewed his recent pulmonology visit.  I will request his fasting lipid panel from his PCP.  We will keep his follow-up appointment as scheduled with Dr. Emogene Morgan.  Today he denies chest pain, shortness of breath, lower extremity edema, fatigue, palpitations, melena, hematuria, hemoptysis, diaphoresis, weakness, presyncope, syncope, orthopnea, and PND.  Past Medical History:  Diagnosis Date   Bilateral carotid artery disease (HCC)    Severe Left and moderate Right   Diabetes mellitus without complication [facility])    ED (erectile dysfunction)    Hyperlipidemia  Hypertension    Lumbar disc disease    Mitral valve prolapse    moderate to severe MR, LVEF 55% Echo 2010   Moderate mitral regurgitation 03/16/2014   S/P mitral valve clip implantation 10/09/2019   s/p TEER using 2 MitraClip XTW devices, first device positioned A2 P2, second device positioned A1 P1 by Dr. Burt Knack    Past Surgical History:  Procedure Laterality Date   CARDIAC CATHETERIZATION     MITRAL VALVE REPAIR  10/09/2019   MITRAL VALVE REPAIR  (N/A )   MITRAL VALVE REPAIR N/A 10/09/2019   Procedure: MITRAL VALVE REPAIR;  Surgeon: Sherren Mocha, MD;  Location: [place] [facility];  Service: Cardiovascular;  Laterality: N/A;   RIGHT/LEFT HEART CATH AND CORONARY ANGIOGRAPHY N/A 09/16/2019   Procedure: RIGHT/LEFT HEART CATH AND CORONARY ANGIOGRAPHY;  Surgeon: Belva Crome, MD;  Location: [place] [facility];  Service: Cardiovascular;  Laterality: N/A;   TEE WITHOUT CARDIOVERSION N/A 09/16/2019   Procedure: TRANSESOPHAGEAL ECHOCARDIOGRAM (TEE);  Surgeon: Lelon Perla, MD;  Location: [facility] ENDOSCOPY;  Service: Cardiovascular;  Laterality: N/A;    Current Medications: No outpatient medications have been marked as taking for the 05/25/21 encounter (Appointment) with Deberah Pelton, NP.     Allergies:   Duloxetine hcl   Social History   Socioeconomic History   Marital status: Married    Spouse name: Not on file   Number of children: Not on file   Years of education: Not on file   Highest education level: Not on file  Occupational History   Not on file  Tobacco Use   Smoking status: Never   Smokeless tobacco: Never  Vaping Use   Vaping Use: Never used  Substance and Sexual Activity   Alcohol use: No    Alcohol/week: 0.0 standard drinks   Drug use: No   Sexual activity: Not on file  Other Topics Concern   Not on file  Social History Narrative   Not on file   Social Determinants of Health   Financial Resource Strain: Not on file  Food Insecurity: Not on file  Transportation Needs: Not on file  Physical Activity: Not on file  Stress: Not on file  Social Connections: Not on file     Family History: The patient's family history includes Cancer in his mother; Dementia in his sister; Diabetes in his father and sister; Heart disease in his father and sister; Obesity in his sister; Other in his father and sister; Stroke in his father.  ROS:   Please see the history of present illness.     All other systems reviewed and  are negative.   Risk Assessment/Calculations:           Physical Exam:    VS:  There were no vitals taken for this visit.    Wt Readings from Last 3 Encounters:  05/18/21 135 lb 6.4 oz (61.4 kg)  05/07/21 148 lb 2.4 oz (67.2 kg)  04/26/21 140 lb 6.9 oz (63.7 kg)     GEN:  Well nourished, well developed in no acute distress HEENT: Normal NECK: No JVD; No carotid bruits LYMPHATICS: No lymphadenopathy CARDIAC: RRR, no murmurs, rubs, gallops RESPIRATORY:  Clear to auscultation without rales, wheezing or rhonchi  ABDOMEN: Soft, non-tender, non-distended MUSCULOSKELETAL:  No edema; No deformity  SKIN: Warm and dry NEUROLOGIC:  Alert and oriented x 3 PSYCHIATRIC:  Normal affect    EKGs/Labs/Other Studies Reviewed:    The following studies were reviewed today: Echocardiogram 04/26/2021  IMPRESSIONS     1. Left ventricular ejection fraction, by estimation, is 55 to 60%. Left  ventricular ejection fraction by 3D volume is 56 %. The left ventricle has  normal function. The left ventricle has no regional wall motion  abnormalities. There is mild concentric  left ventricular hypertrophy. Left ventricular diastolic function could  not be evaluated. Elevated left atrial pressure.   2. Right ventricular systolic function is normal. The right ventricular  size is normal. Tricuspid regurgitation signal is inadequate for assessing  PA pressure.   3. Left atrial size was moderately dilated.   4. Right atrial size was moderately dilated.   5. Moderate pleural effusion in the left lateral region.   6. The mitral valve has been repaired/replaced. Moderate mitral valve  regurgitation. The mean mitral valve gradient is 4.0 mmHg with average  heart rate of 58 bpm. There is a Mitra-Clip present in the mitral  position. Procedure Date: 10/09/2019.   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   8. The aortic valve is tricuspid. There is moderate calcification of the  aortic valve. There is  moderate thickening of the aortic valve. Aortic  valve regurgitation is mild.   9. The inferior vena cava is dilated in size with <50% respiratory  variability, suggesting right atrial pressure of 15 mmHg.   Comparison(s): No significant change from prior study. Prior images  reviewed side by side.   EKG:  EKG is  ordered today.  The ekg ordered today demonstrates sinus rhythm with first-degree AV block with premature atrial complexes atrial septal infarct undetermined age 20 bpm  Recent Labs: 04/25/2021: Magnesium 1.8; TSH 9.172 05/03/2021: ALT 50 05/05/2021: Hemoglobin 11.7; Platelets 319 05/07/2021: BUN 16; Creatinine, Ser 0.93; Potassium 4.0; Sodium 128  Recent Lipid Panel No results found for: CHOL, TRIG, HDL, CHOLHDL, VLDL, LDLCALC, LDLDIRECT  ASSESSMENT & PLAN    Junctional tachycardia-EKG today shows sinus rhythm with first-degree AV block with premature atrial complexes atrial septal infarct undetermined age 37 bpm.  Denies recent episodes of accelerated heart rate or shortness of breath. Continue metoprolol Heart healthy low-sodium diet-salty 6 given Increase physical activity as tolerated  Near syncope-denies further episodes of lightheadedness, dizziness, presyncope or syncope. Maintain p.o. hydration, change positions slowly. Continue to monitor  Essential hypertension-BP today 108/60.  Well-controlled at home. Continue ramipril, metoprolol Heart healthy low-sodium diet-salty 6 given Increase physical activity as tolerated  Mitral valve prolapse-no increased DOE or activity intolerance.  Status post mitral valve clip implantation 10/09/2019.  Echocardiogram 12/22 showed moderate mitral valve regurgitation with well-positioned mitral clip. Follows with Dr. Burt Knack  Hyperlipidemia-LDL 44 on 12/22/2019 Continue aspirin, pravastatin Heart healthy low-sodium diet-salty 6 given Increase physical activity as tolerated Request lipid panel from PCP  Recurrent right pleural  effusion-no increased dyspnea.  CXR 05/18/2021 showed trace right pleural effusion with improvement from prior exam. Follows with pulmonology  Disposition: Follow-up with Dr. Tamala Julian as scheduled.       Medication Adjustments/Labs and Tests Ordered: Current medicines are reviewed at length with the patient today.  Concerns regarding medicines are outlined above.  No orders of the defined types were placed in this encounter.  No orders of the defined types were placed in this encounter.   There are no Patient Instructions on file for this visit.   Signed, Deberah Pelton, NP  05/24/2021 8:41 AM        Notice: This dictation was prepared with Dragon dictation along with smaller phrase technology. Any transcriptional errors that result  from this process are unintentional and may not be corrected upon review.  I spent 15 minutes examining this patient, reviewing medications, and using patient centered shared decision making involving her cardiac care.  Prior to her visit I spent greater than 20 minutes reviewing her past medical history,  medications, and prior cardiac tests.

## 2021-05-25 ENCOUNTER — Ambulatory Visit (HOSPITAL_BASED_OUTPATIENT_CLINIC_OR_DEPARTMENT_OTHER): Payer: Medicare HMO | Admitting: General Practice

## 2021-05-25 ENCOUNTER — Other Ambulatory Visit: Payer: Self-pay

## 2021-05-25 ENCOUNTER — Encounter (HOSPITAL_BASED_OUTPATIENT_CLINIC_OR_DEPARTMENT_OTHER): Payer: Self-pay | Admitting: General Practice

## 2021-05-25 VITALS — BP 108/60 | HR 63 | Ht 69.0 in | Wt 135.7 lb

## 2021-05-25 DIAGNOSIS — I4719 Other supraventricular tachycardia: Secondary | ICD-10-CM

## 2021-05-25 DIAGNOSIS — E782 Mixed hyperlipidemia: Secondary | ICD-10-CM | POA: Diagnosis not present

## 2021-05-25 DIAGNOSIS — J90 Pleural effusion, not elsewhere classified: Secondary | ICD-10-CM

## 2021-05-25 DIAGNOSIS — Z9889 Other specified postprocedural states: Secondary | ICD-10-CM

## 2021-05-25 DIAGNOSIS — R55 Syncope and collapse: Secondary | ICD-10-CM

## 2021-05-25 DIAGNOSIS — Z95818 Presence of other cardiac implants and grafts: Secondary | ICD-10-CM | POA: Diagnosis not present

## 2021-05-25 DIAGNOSIS — I10 Essential (primary) hypertension: Secondary | ICD-10-CM

## 2021-05-25 DIAGNOSIS — I471 Supraventricular tachycardia: Secondary | ICD-10-CM

## 2021-05-25 MED ORDER — METOPROLOL TARTRATE 25 MG PO TABS: 12.5000 mg | ORAL_TABLET | Freq: Two times a day (BID) | ORAL | 3 refills | Status: DC

## 2021-05-25 NOTE — Patient Instructions (Signed)
Medication Instructions:  Continue current medications  *If you need a refill on your cardiac medications before your next appointment, please call your pharmacy*   Lab Work: None Ordered   Testing/Procedures: None Ordered   Follow-Up: At [facility], you and your health needs are our priority.  As part of our continuing mission to provide you with exceptional heart care, we have created designated Provider Care Teams.  These Care Teams include your primary Cardiologist (physician) and Advanced Practice Providers (APPs -  Physician Assistants and Nurse Practitioners) who all work together to provide you with the care you need, when you need it.  We recommend signing up for the patient portal called "MyChart".  Sign up information is provided on this After Visit Summary.  MyChart is used to connect with patients for Virtual Visits (Telemedicine).  Patients are able to view lab/test results, encounter notes, upcoming appointments, etc.  Non-urgent messages can be sent to your provider as well.   To learn more about what you can do with MyChart, go to [email or web address].    Your next appointment:   Keep appointment on March 20th @ 11:40 am  The format for your next appointment:   In Person  Provider:   [name], MD

## 2021-05-27 DIAGNOSIS — R7303 Prediabetes: Secondary | ICD-10-CM | POA: Diagnosis not present

## 2021-05-27 DIAGNOSIS — R69 Illness, unspecified: Secondary | ICD-10-CM | POA: Diagnosis not present

## 2021-05-27 DIAGNOSIS — I5032 Chronic diastolic (congestive) heart failure: Secondary | ICD-10-CM | POA: Diagnosis not present

## 2021-05-27 DIAGNOSIS — I10 Essential (primary) hypertension: Secondary | ICD-10-CM | POA: Diagnosis not present

## 2021-05-27 DIAGNOSIS — E782 Mixed hyperlipidemia: Secondary | ICD-10-CM | POA: Diagnosis not present

## 2021-05-27 DIAGNOSIS — E039 Hypothyroidism, unspecified: Secondary | ICD-10-CM | POA: Diagnosis not present

## 2021-05-31 DIAGNOSIS — I5033 Acute on chronic diastolic (congestive) heart failure: Secondary | ICD-10-CM | POA: Diagnosis not present

## 2021-05-31 DIAGNOSIS — N1831 Chronic kidney disease, stage 3a: Secondary | ICD-10-CM | POA: Diagnosis not present

## 2021-05-31 DIAGNOSIS — S2241XD Multiple fractures of ribs, right side, subsequent encounter for fracture with routine healing: Secondary | ICD-10-CM | POA: Diagnosis not present

## 2021-05-31 DIAGNOSIS — R69 Illness, unspecified: Secondary | ICD-10-CM | POA: Diagnosis not present

## 2021-05-31 DIAGNOSIS — I130 Hypertensive heart and chronic kidney disease with heart failure and stage 1 through stage 4 chronic kidney disease, or unspecified chronic kidney disease: Secondary | ICD-10-CM | POA: Diagnosis not present

## 2021-05-31 DIAGNOSIS — E785 Hyperlipidemia, unspecified: Secondary | ICD-10-CM | POA: Diagnosis not present

## 2021-05-31 DIAGNOSIS — E039 Hypothyroidism, unspecified: Secondary | ICD-10-CM | POA: Diagnosis not present

## 2021-05-31 DIAGNOSIS — S272XXD Traumatic hemopneumothorax, subsequent encounter: Secondary | ICD-10-CM | POA: Diagnosis not present

## 2021-05-31 DIAGNOSIS — E1122 Type 2 diabetes mellitus with diabetic chronic kidney disease: Secondary | ICD-10-CM | POA: Diagnosis not present

## 2021-06-02 DIAGNOSIS — R69 Illness, unspecified: Secondary | ICD-10-CM | POA: Diagnosis not present

## 2021-06-02 DIAGNOSIS — S272XXD Traumatic hemopneumothorax, subsequent encounter: Secondary | ICD-10-CM | POA: Diagnosis not present

## 2021-06-02 DIAGNOSIS — E1122 Type 2 diabetes mellitus with diabetic chronic kidney disease: Secondary | ICD-10-CM | POA: Diagnosis not present

## 2021-06-02 DIAGNOSIS — E039 Hypothyroidism, unspecified: Secondary | ICD-10-CM | POA: Diagnosis not present

## 2021-06-02 DIAGNOSIS — I130 Hypertensive heart and chronic kidney disease with heart failure and stage 1 through stage 4 chronic kidney disease, or unspecified chronic kidney disease: Secondary | ICD-10-CM | POA: Diagnosis not present

## 2021-06-02 DIAGNOSIS — S2241XD Multiple fractures of ribs, right side, subsequent encounter for fracture with routine healing: Secondary | ICD-10-CM | POA: Diagnosis not present

## 2021-06-02 DIAGNOSIS — I5033 Acute on chronic diastolic (congestive) heart failure: Secondary | ICD-10-CM | POA: Diagnosis not present

## 2021-06-02 DIAGNOSIS — N1831 Chronic kidney disease, stage 3a: Secondary | ICD-10-CM | POA: Diagnosis not present

## 2021-06-02 DIAGNOSIS — E785 Hyperlipidemia, unspecified: Secondary | ICD-10-CM | POA: Diagnosis not present

## 2021-06-07 DIAGNOSIS — I5033 Acute on chronic diastolic (congestive) heart failure: Secondary | ICD-10-CM | POA: Diagnosis not present

## 2021-06-07 DIAGNOSIS — R69 Illness, unspecified: Secondary | ICD-10-CM | POA: Diagnosis not present

## 2021-06-07 DIAGNOSIS — S2241XD Multiple fractures of ribs, right side, subsequent encounter for fracture with routine healing: Secondary | ICD-10-CM | POA: Diagnosis not present

## 2021-06-07 DIAGNOSIS — E039 Hypothyroidism, unspecified: Secondary | ICD-10-CM | POA: Diagnosis not present

## 2021-06-07 DIAGNOSIS — S272XXD Traumatic hemopneumothorax, subsequent encounter: Secondary | ICD-10-CM | POA: Diagnosis not present

## 2021-06-07 DIAGNOSIS — E785 Hyperlipidemia, unspecified: Secondary | ICD-10-CM | POA: Diagnosis not present

## 2021-06-07 DIAGNOSIS — N1831 Chronic kidney disease, stage 3a: Secondary | ICD-10-CM | POA: Diagnosis not present

## 2021-06-07 DIAGNOSIS — E1122 Type 2 diabetes mellitus with diabetic chronic kidney disease: Secondary | ICD-10-CM | POA: Diagnosis not present

## 2021-06-07 DIAGNOSIS — I130 Hypertensive heart and chronic kidney disease with heart failure and stage 1 through stage 4 chronic kidney disease, or unspecified chronic kidney disease: Secondary | ICD-10-CM | POA: Diagnosis not present

## 2021-06-09 DIAGNOSIS — E039 Hypothyroidism, unspecified: Secondary | ICD-10-CM | POA: Diagnosis not present

## 2021-06-09 DIAGNOSIS — E1122 Type 2 diabetes mellitus with diabetic chronic kidney disease: Secondary | ICD-10-CM | POA: Diagnosis not present

## 2021-06-09 DIAGNOSIS — S272XXD Traumatic hemopneumothorax, subsequent encounter: Secondary | ICD-10-CM | POA: Diagnosis not present

## 2021-06-09 DIAGNOSIS — R69 Illness, unspecified: Secondary | ICD-10-CM | POA: Diagnosis not present

## 2021-06-09 DIAGNOSIS — I5033 Acute on chronic diastolic (congestive) heart failure: Secondary | ICD-10-CM | POA: Diagnosis not present

## 2021-06-09 DIAGNOSIS — N1831 Chronic kidney disease, stage 3a: Secondary | ICD-10-CM | POA: Diagnosis not present

## 2021-06-09 DIAGNOSIS — E785 Hyperlipidemia, unspecified: Secondary | ICD-10-CM | POA: Diagnosis not present

## 2021-06-09 DIAGNOSIS — S2241XD Multiple fractures of ribs, right side, subsequent encounter for fracture with routine healing: Secondary | ICD-10-CM | POA: Diagnosis not present

## 2021-06-09 DIAGNOSIS — I130 Hypertensive heart and chronic kidney disease with heart failure and stage 1 through stage 4 chronic kidney disease, or unspecified chronic kidney disease: Secondary | ICD-10-CM | POA: Diagnosis not present

## 2021-06-11 ENCOUNTER — Emergency Department (HOSPITAL_BASED_OUTPATIENT_CLINIC_OR_DEPARTMENT_OTHER): Payer: Medicare HMO

## 2021-06-11 ENCOUNTER — Emergency Department (HOSPITAL_BASED_OUTPATIENT_CLINIC_OR_DEPARTMENT_OTHER)
Admission: EM | Admit: 2021-06-11 | Discharge: 2021-06-11 | Disposition: A | Discharge status: Home / Self Care | Payer: Medicare HMO | Attending: Emergency Medicine | Admitting: Emergency Medicine

## 2021-06-11 ENCOUNTER — Other Ambulatory Visit: Payer: Self-pay

## 2021-06-11 ENCOUNTER — Encounter (HOSPITAL_BASED_OUTPATIENT_CLINIC_OR_DEPARTMENT_OTHER): Payer: Self-pay | Admitting: Emergency Medicine

## 2021-06-11 DIAGNOSIS — Z7982 Long term (current) use of aspirin: Secondary | ICD-10-CM | POA: Insufficient documentation

## 2021-06-11 DIAGNOSIS — K5289 Other specified noninfective gastroenteritis and colitis: Secondary | ICD-10-CM | POA: Insufficient documentation

## 2021-06-11 DIAGNOSIS — J90 Pleural effusion, not elsewhere classified: Secondary | ICD-10-CM | POA: Insufficient documentation

## 2021-06-11 DIAGNOSIS — K5900 Constipation, unspecified: Secondary | ICD-10-CM

## 2021-06-11 DIAGNOSIS — I10 Essential (primary) hypertension: Secondary | ICD-10-CM | POA: Diagnosis not present

## 2021-06-11 DIAGNOSIS — K529 Noninfective gastroenteritis and colitis, unspecified: Secondary | ICD-10-CM | POA: Diagnosis not present

## 2021-06-11 DIAGNOSIS — Z79899 Other long term (current) drug therapy: Secondary | ICD-10-CM | POA: Diagnosis not present

## 2021-06-11 DIAGNOSIS — K5730 Diverticulosis of large intestine without perforation or abscess without bleeding: Secondary | ICD-10-CM | POA: Diagnosis not present

## 2021-06-11 DIAGNOSIS — R0602 Shortness of breath: Secondary | ICD-10-CM | POA: Diagnosis not present

## 2021-06-11 LAB — COMPREHENSIVE METABOLIC PANEL
ALT: 53 U/L — ABNORMAL HIGH (ref 0–44)
AST: 49 U/L — ABNORMAL HIGH (ref 15–41)
Albumin: 4.2 g/dL (ref 3.5–5.0)
Alkaline Phosphatase: 76 U/L (ref 38–126)
Anion gap: 12 (ref 5–15)
BUN: 21 mg/dL (ref 8–23)
CO2: 22 mmol/L (ref 22–32)
Calcium: 9.8 mg/dL (ref 8.9–10.3)
Chloride: 95 mmol/L — ABNORMAL LOW (ref 98–111)
Creatinine, Ser: 1.18 mg/dL (ref 0.61–1.24)
GFR, Estimated: 59 mL/min — ABNORMAL LOW (ref >60)
Glucose, Bld: 158 mg/dL — ABNORMAL HIGH (ref 70–99)
Potassium: 3.9 mmol/L (ref 3.5–5.1)
Sodium: 129 mmol/L — ABNORMAL LOW (ref 135–145)
Total Bilirubin: 0.9 mg/dL (ref 0.3–1.2)
Total Protein: 7.7 g/dL (ref 6.5–8.1)

## 2021-06-11 LAB — URINALYSIS, ROUTINE W REFLEX MICROSCOPIC
Bilirubin Urine: NEGATIVE
Glucose, UA: NEGATIVE mg/dL
Leukocytes,Ua: NEGATIVE
Nitrite: NEGATIVE
Protein, ur: 30 mg/dL — AB
Specific Gravity, Urine: 1.015 (ref 1.005–1.030)
pH: 6 (ref 5.0–8.0)

## 2021-06-11 LAB — CBC WITH DIFFERENTIAL/PLATELET
Abs Immature Granulocytes: 0.03 10*3/uL (ref 0.00–0.07)
Basophils Absolute: 0 10*3/uL (ref 0.0–0.1)
Basophils Relative: 1 %
Eosinophils Absolute: 0 10*3/uL (ref 0.0–0.5)
Eosinophils Relative: 0 %
HCT: 40.9 % (ref 39.0–52.0)
Hemoglobin: 13.8 g/dL (ref 13.0–17.0)
Immature Granulocytes: 0 %
Lymphocytes Relative: 29 %
Lymphs Abs: 2.3 10*3/uL (ref 0.7–4.0)
MCH: 31.4 pg (ref 26.0–34.0)
MCHC: 33.7 g/dL (ref 30.0–36.0)
MCV: 93.2 fL (ref 80.0–100.0)
Monocytes Absolute: 0.6 10*3/uL (ref 0.1–1.0)
Monocytes Relative: 8 %
Neutro Abs: 4.8 10*3/uL (ref 1.7–7.7)
Neutrophils Relative %: 62 %
Platelets: 191 10*3/uL (ref 150–400)
RBC: 4.39 MIL/uL (ref 4.22–5.81)
RDW: 14 % (ref 11.5–15.5)
WBC: 7.8 10*3/uL (ref 4.0–10.5)
nRBC: 0 % (ref 0.0–0.2)

## 2021-06-11 LAB — LIPASE, BLOOD: Lipase: <10 U/L — ABNORMAL LOW (ref 11–51)

## 2021-06-11 LAB — TROPONIN I (HIGH SENSITIVITY)
Troponin I (High Sensitivity): 29 ng/L — ABNORMAL HIGH (ref <18)
Troponin I (High Sensitivity): 30 ng/L — ABNORMAL HIGH (ref <18)

## 2021-06-11 LAB — BRAIN NATRIURETIC PEPTIDE: B Natriuretic Peptide: 2199.4 pg/mL — ABNORMAL HIGH (ref 0.0–100.0)

## 2021-06-11 MED ORDER — PEG 3350-KCL-NABCB-NACL-NASULF 236 G PO SOLR: ORAL | 0 refills | Status: DC

## 2021-06-11 MED ORDER — POLYETHYLENE GLYCOL 3350 17 GM/SCOOP PO POWD: 1.0000 | Freq: Every day | ORAL | 0 refills | Status: DC

## 2021-06-11 MED ORDER — FLEET ENEMA 7-19 GM/118ML RE ENEM: 1.0000 | ENEMA | Freq: Once | RECTAL | 0 refills | Status: AC

## 2021-06-11 MED ORDER — IOHEXOL 300 MG/ML  SOLN
100.0000 mL | Freq: Once | INTRAMUSCULAR | Status: AC | PRN
  Administered 2021-06-11: 100 mL via INTRAVENOUS

## 2021-06-11 MED ORDER — GLYCERIN (ADULT) 2 G RE SUPP: 1.0000 | RECTAL | 0 refills | Status: DC | PRN

## 2021-06-11 NOTE — ED Provider Notes (Addendum)
[place] EMERGENCY DEPT Provider Note   CSN: [id number] Arrival date & time: 06/11/21  4098     History  Chief Complaint  Patient presents with   Constipation   Shortness of Breath    Adrian COLELLO is a 88 y.o. male.  Pt is a 88 yo male with pmh of htn, hyperlipidemia, db, presenting for sob. Recent hx pertinent for fall on 11/24 resulting in right sided rib fractures that resulted in recurrent in right sided pleural effusion with recent pig tail placement and removal on 12/20-12/26. Admits to sob today that has slowly been worsening over the past three days associated with orthopnea. No leg swelling. Pt also admits to constipation x3 days. Using dulcolax x 1 day.    Constipation Associated symptoms: no abdominal pain, no back pain, no dysuria, no fever and no vomiting   Shortness of Breath Associated symptoms: no abdominal pain, no chest pain, no cough, no ear pain, no fever, no rash, no sore throat and no vomiting       Home Medications Prior to Admission medications   Medication Sig Start Date End Date Taking? Authorizing Provider  aspirin 81 MG EC tablet Take 81 mg by mouth daily.    [provider]  Cholecalciferol (VITAMIN D) 2000 UNITS tablet Take 2,000 Units by mouth daily at 12 noon.     [provider]  Cyanocobalamin (VITAMIN B-12) 2000 MCG TBCR 1 tablet    [provider]  finasteride (PROSCAR) 5 MG tablet Take 5 mg by mouth daily at 12 noon.     [provider]  levothyroxine (SYNTHROID, LEVOTHROID) 75 MCG tablet Take 75 mcg by mouth daily before breakfast.    [provider]  metFORMIN (GLUCOPHAGE) 500 MG tablet Take 500 mg by mouth every evening.  04/23/16   [provider]  metoprolol tartrate (LOPRESSOR) 25 MG tablet Take 0.5 tablets (12.5 mg total) by mouth 2 (two) times daily. 05/25/21 06/24/21  Deberah Pelton, NP  pravastatin (PRAVACHOL) 20 MG tablet Take 20 mg by mouth at bedtime.      [provider]  psyllium (METAMUCIL) 58.6 % packet Take 1 packet by mouth daily.    [provider]  ramipril (ALTACE) 5 MG capsule Take 5 mg by mouth daily.     [provider]  sodium chloride (OCEAN) 0.65 % SOLN nasal spray Place 1 spray into both nostrils daily as needed for congestion.    [provider]  tamsulosin (FLOMAX) 0.4 MG CAPS capsule Take 0.4 mg by mouth daily at 12 noon.  04/13/18   [provider]  Thiamine HCl (VITAMIN B1 PO) Take 125 mcg by mouth daily.    [provider]  venlafaxine XR (EFFEXOR-XR) 75 MG 24 hr capsule Take 1 capsule (75 mg total) by mouth daily with breakfast. 05/10/21   Collene Gobble, MD      Allergies    Duloxetine hcl    Review of Systems   Review of Systems  Constitutional:  Negative for chills and fever.  HENT:  Negative for ear pain and sore throat.   Eyes:  Negative for pain and visual disturbance.  Respiratory:  Positive for shortness of breath. Negative for cough.   Cardiovascular:  Negative for chest pain and palpitations.  Gastrointestinal:  Positive for constipation. Negative for abdominal pain and vomiting.  Genitourinary:  Negative for dysuria and hematuria.  Musculoskeletal:  Negative for arthralgias and back pain.  Skin:  Negative for color  change and rash.  Neurological:  Negative for seizures and syncope.  All other systems reviewed and are negative.  Physical Exam Updated Vital Signs Pulse 80    Resp (!) 24    SpO2 95%  Physical Exam Vitals and nursing note reviewed.  Constitutional:      General: He is not in acute distress.    Appearance: He is well-developed.  HENT:     Head: Normocephalic and atraumatic.  Eyes:     Conjunctiva/sclera: Conjunctivae normal.  Cardiovascular:     Rate and Rhythm: Normal rate and regular rhythm.     Heart sounds: No murmur heard. Pulmonary:     Effort: Pulmonary effort is normal. No respiratory distress.     Breath sounds:  Examination of the right-lower field reveals rales. Rales present.  Abdominal:     Palpations: Abdomen is soft.     Tenderness: There is no abdominal tenderness.  Musculoskeletal:        General: No swelling.     Cervical back: Neck supple.     Right lower leg: No edema.     Left lower leg: No edema.  Skin:    General: Skin is warm and dry.     Capillary Refill: Capillary refill takes less than 2 seconds.  Neurological:     Mental Status: He is alert.     GCS: GCS eye subscore is 4. GCS verbal subscore is 5. GCS motor subscore is 6.  Psychiatric:        Mood and Affect: Mood normal.    ED Results / Procedures / Treatments   Labs (all labs ordered are listed, but only abnormal results are displayed) Labs Reviewed - No data to display  EKG None  Radiology No results found.  Procedures Procedures    Medications Ordered in ED Medications - No data to display  ED Course/ Medical Decision Making/ A&P                           Medical Decision Making Amount and/or Complexity of Data Reviewed Labs: ordered. Radiology: ordered.  Risk OTC drugs. Prescription drug management.   10:13 AM 88 yo male with pmh of htn, hyperlipidemia, db, presenting for sob. Recent hx pertinent for fall on 11/24 resulting in right sided rib fractures that resulted in recurrent in right sided pleural effusion with recent pig tail placement and removal on 12/20-12/26.  Pt has rales in right lower lung field. CXR shows small pleural effusions decreased in size from recent admission. Troponin minimally elevated, decreased from previous studies, and likely secondary to ischemic demand. No ST segment elevation or depression on ECG. Pt denies chest pain. BNP elevated likely secondary to chronic diastolic heart failure. Pt has improving cxr from previous studies. Recommendations to follow up outpatient with pulmonologist. Pt has established appointment on Feb 13th 2023.   Pt also presenting for  constipation. CT abdomen concerning for stercoral colitis. Digital disimpaction offered and declined. Long list of interventions provided including: For constipation prevention: -Take stool softer Miralax daily for 14 days -Drink water -Daily salad -Increase physical activity/walking For constipation treatment: -Try enema -Try Fleet suppository -Try colonoscopy prep liquids  Drink at least six 8 oz water bottles this day to prevent dehydration  Schedule appointment with GI doctor for chronic constipation   Patient in no distress and overall condition improved here in the ED. Detailed discussions were had with the patient regarding current findings, and need for  close f/u with PCP or on call doctor. The patient has been instructed to return immediately if the symptoms worsen in any way for re-evaluation. Patient verbalized understanding and is in agreement with current care plan. All questions answered prior to discharge.            Final Clinical Impression(s) / ED Diagnoses Final diagnoses:  Constipation, unspecified constipation type  Recurrent Pleural effusion, bilateral  Stercoral colitis    Rx / DC Orders ED Discharge Orders     None         Lianne Cure, DO 53/29/92 [id number]    Lianne Cure, DO 34/19/62 1325

## 2021-06-11 NOTE — ED Notes (Signed)
Patient transported to CT 

## 2021-06-11 NOTE — ED Notes (Signed)
Patient to rest room and had large BM

## 2021-06-11 NOTE — ED Triage Notes (Signed)
Pts family states patient has had constipation since December, lbm 3 days ago. Pt states it has involved his urination as well,to the point he has hard time voiding. Pt also then states he has been short of breath for past few days. Pt 's baseline since December has been short of breath, difficult to ascertain from family if has worsened .

## 2021-06-11 NOTE — Discharge Instructions (Addendum)
For constipation prevention: -Take stool softer Miralax daily for 14 days -Drink water -Daily salad -Increase physical activity/walking  For constipation treatment: -Try enema -Try Fleet suppository -Try colonoscopy prep liquids  Drink at least six 8 oz water bottles this day to prevent dehydration  Schedule appointment with GI doctor for chronic constipation

## 2021-06-13 ENCOUNTER — Ambulatory Visit: Payer: Medicare HMO | Admitting: Nurse Practitioner

## 2021-06-13 ENCOUNTER — Encounter: Payer: Self-pay | Admitting: Nurse Practitioner

## 2021-06-13 ENCOUNTER — Other Ambulatory Visit: Payer: Self-pay

## 2021-06-13 ENCOUNTER — Ambulatory Visit: Payer: Medicare HMO | Admitting: Physician Assistant

## 2021-06-13 VITALS — BP 126/70 | HR 55 | Ht 69.0 in | Wt 137.6 lb

## 2021-06-13 DIAGNOSIS — I5032 Chronic diastolic (congestive) heart failure: Secondary | ICD-10-CM

## 2021-06-13 DIAGNOSIS — J9601 Acute respiratory failure with hypoxia: Secondary | ICD-10-CM

## 2021-06-13 DIAGNOSIS — J189 Pneumonia, unspecified organism: Secondary | ICD-10-CM | POA: Diagnosis not present

## 2021-06-13 DIAGNOSIS — J90 Pleural effusion, not elsewhere classified: Secondary | ICD-10-CM

## 2021-06-13 MED ORDER — AMOXICILLIN-POT CLAVULANATE 875-125 MG PO TABS
1.0000 | ORAL_TABLET | Freq: Two times a day (BID) | ORAL | 0 refills | Status: DC
Start: 2021-06-13 — End: 2021-06-21

## 2021-06-13 MED ORDER — FUROSEMIDE 20 MG PO TABS: 20.0000 mg | ORAL_TABLET | Freq: Every day | ORAL | 0 refills | Status: DC

## 2021-06-13 NOTE — Patient Instructions (Signed)
-  Lasix 20 mg daily for five days. Take in AM. Monitor BP and notify if <100/60. Change positions slowly and notify if dizziness occurs.  -Augmentin 875 Twice daily for 7 days. Notify immediately of any rash, itching, hives, or swelling, or seek emergency care.Finish your antibiotics in their entirety. Do not stop just because symptoms improve. Take with food to reduce GI upset.  Follow up with cardiology to discuss heart failure, elevated BNP and recurrent pleural effusions.   I will discuss repeating echocardiogram with cardiology and Dr. Erin Fulling and let you know if we move forward with that.   Follow up in one week with Dr. Erin Fulling or Alanson Aly. If symptoms do not improve or worsen, please contact office for sooner follow up or seek emergency care.

## 2021-06-13 NOTE — Progress Notes (Signed)
@Patient  ID: Adrian Olson, male    DOB: 1930/09/19, 88 y.o.   MRN: [id number]  Chief Complaint  Patient presents with   Follow-up    Fluid in pleural cavity    Referring provider: Merrilee Seashore, MD  HPI: 88 year old male, never smoker followed for pleural effusion.  He is a patient Dr. August Albino and was last seen in office on 05/18/2021 by [facility] NP.  Past medical history significant for bilateral carotid artery disease, DM2, ED, hyperlipidemia, hypertension, lumbar disc disease, MVP status post mitral valve clip, CHF.  TEST/EVENTS:  04/24/2021 CTA chest: Mild to moderate cardiomegaly.  Prosthetic mitral valve noted.  Atherosclerosis.  Moderate to large right pleural fluid collection and small to moderate left pleural fluid collection with associated bibasilar atelectasis.  Multiple associated mildly displaced and slightly comminuted fractures of posterior lateral right ribs 6 through 11. 04/26/2021 echocardiogram: EF 55 to 60%.  Mild concentric LVH.  Unable to evaluate LV diastolic function.  Left and right atrium is moderately dilated.  Moderate pleural effusion and the left lateral region.  MV previously repaired/replaced with moderate MVR.  Mild AVR. 05/06/2021 CT chest: Adequate drainage of previously noted pleural effusion.  Trace right pleural effusion.  Small left pleural effusion.  Some focal areas of patchy infiltrate in the right middle lobe and right lower lobe.  Question inflammatory versus infectious.  Question contusion. 05/18/2021 CXR 2 view: Trace right pleural effusion, decreased in size compared to prior.  Right basilar atelectasis. 06/11/2021 CXR 1 view: Lungs hyperexpanded.  Interstitial thickening and peribronchovascular opacities are noted in the lung bases, increased when compared to prior exam.  Interstitial prominence throughout the remainder of the lungs is stable.  Small effusion suspected.  05/03/2021: OV with Dr. Erin Fulling for hospital follow-up for pleural effusion  after fall.  Admitted 12/11-12/13.  Noted to be symptomatic at this visit with shortness of breath and mild respiratory distress.  Directly admitted to the hospital due to need for further intervention.  05/03/2021 to 05/09/2021: Hospital admission for recurrent right pleural effusion.  Right pigtail catheter placed in pleural space on 12/21 with 2810 cc removed initially and total 4440 cc; d/c 12/26.  Pleural fluid significant for low protein but high LDH, WBC 734 (61% lymphs, 12% eos, 12% mono).  Negative cytology and culture.  Treated with doxycycline for possible subclinical pneumonia.  HCTZ previously discontinued by cardiology due to hyponatremia; however during hospitalization this was noted to be a chronic problem and did not have any evidence of SIADH.   05/18/2021: OV with [facility] Martinson NP for hospital follow-up.  Resolution of shortness of breath.  Was experiencing some persistent fatigue and loss of interest in activities that he previously enjoyed.  Advised following up with PCP to discuss depression.  Fatigue possibly related to deconditioning posthospitalization.  CXR with trace right pleural effusion, decreased in size when compared to previous.  Touch base with cardiology to follow-up with patient and discussed restarting diuretic therapy.  Walking oximetry without desaturation.  06/13/2021: Today-acute visit Patient presents today with daughter after being seen in the ED on 1/28 for shortness of breath and constipation.  He was found to have bilateral small pleural effusions and BNP was elevated to 2199.4 pg/mL.  Discharged home on bowel regimen and advised to follow up with pulm for SOB. Today, he reports continuing to experience shortness of breath that started last week. This occurs with minimal exertion and he feels as though he can no longer complete easy daily activities such as  dishes without becoming short of breath. Minimal dry cough. He denies orthopnea, PND, chest pain or lower extremity  swelling. He has had multiple BMs since leaving the ED with some relief in his abdominal pressure but has not noticed a difference in his breathing. He was seen by cardiology on 1/11 but was not restarted on diuretic therapy.   Allergies  Allergen Reactions   Duloxetine Hcl Other (See Comments)    withdrawal    Immunization History  Administered Date(s) Administered   Fluad Quad(high Dose 65+) 02/18/2021   PFIZER(Purple Top)SARS-COV-2 Vaccination 06/19/2019, 07/15/2019    Past Medical History:  Diagnosis Date   Bilateral carotid artery disease (HCC)    Severe Left and moderate Right   Diabetes mellitus without complication [facility])    ED (erectile dysfunction)    Hyperlipidemia    Hypertension    Lumbar disc disease    Mitral valve prolapse    moderate to severe MR, LVEF 55% Echo 2010   Moderate mitral regurgitation 03/16/2014   S/P mitral valve clip implantation 10/09/2019   s/p TEER using 2 MitraClip XTW devices, first device positioned A2 P2, second device positioned A1 P1 by Dr. Burt Knack    Tobacco History: Social History   Tobacco Use  Smoking Status Never  Smokeless Tobacco Never   Counseling given: Not Answered   Outpatient Medications Prior to Visit  Medication Sig Dispense Refill   aspirin 81 MG EC tablet Take 81 mg by mouth daily.     Cholecalciferol (VITAMIN D) 2000 UNITS tablet Take 2,000 Units by mouth daily at 12 noon.      Cyanocobalamin (VITAMIN B-12) 2000 MCG TBCR 1 tablet     finasteride (PROSCAR) 5 MG tablet Take 5 mg by mouth daily at 12 noon.      levothyroxine (SYNTHROID, LEVOTHROID) 75 MCG tablet Take 75 mcg by mouth daily before breakfast.     metFORMIN (GLUCOPHAGE) 500 MG tablet Take 500 mg by mouth every evening.      metoprolol tartrate (LOPRESSOR) 25 MG tablet Take 0.5 tablets (12.5 mg total) by mouth 2 (two) times daily. 90 tablet 3   pravastatin (PRAVACHOL) 20 MG tablet Take 20 mg by mouth at bedtime.      ramipril (ALTACE) 5 MG capsule Take 5  mg by mouth daily.      sodium chloride (OCEAN) 0.65 % SOLN nasal spray Place 1 spray into both nostrils daily as needed for congestion.     tamsulosin (FLOMAX) 0.4 MG CAPS capsule Take 0.4 mg by mouth daily at 12 noon.      Thiamine HCl (VITAMIN B1 PO) Take 125 mcg by mouth daily.     venlafaxine XR (EFFEXOR-XR) 75 MG 24 hr capsule Take 1 capsule (75 mg total) by mouth daily with breakfast.     glycerin adult 2 g suppository Place 1 suppository rectally as needed for constipation. (Patient not taking: Reported on 06/13/2021) 12 suppository 0   polyethylene glycol (GOLYTELY) 236 g solution Drink 240 ml every 15 minutes for bowel movement up until 1 L. If still no bowel movement wait 90 min before repeating. (Patient not taking: Reported on 06/13/2021) 2000 mL 0   polyethylene glycol powder (GLYCOLAX/MIRALAX) 17 GM/SCOOP powder Take 255 g by mouth daily for 14 doses. (Patient not taking: Reported on 06/13/2021) 255 g 0   psyllium (METAMUCIL) 58.6 % packet Take 1 packet by mouth daily. (Patient not taking: Reported on 06/13/2021)     No facility-administered medications prior to visit.  Review of Systems:   Constitutional: No weight loss or gain, night sweats, fevers, chills. +fatigue HEENT: No headaches, difficulty swallowing, tooth/dental problems, or sore throat. No sneezing, itching, ear ache, nasal congestion, or post nasal drip CV:  No chest pain, orthopnea, PND, swelling in lower extremities, anasarca, dizziness, palpitations, syncope Resp: +shortness of breath with exertion; minimal dry cough. No excess mucus or change in color of mucus.  No hemoptysis. No wheezing.  No chest wall deformity GI:  +constipation. No heartburn, indigestion, abdominal pain, nausea, vomiting, diarrhea, loss of appetite, bloody stools.  GU: No dysuria, change in color of urine, urgency or frequency.  No flank pain, no hematuria  Skin: No rash, lesions, ulcerations MSK:  No joint pain or swelling.  No decreased  range of motion.  No back pain. Neuro: No dizziness or lightheadedness.  Psych: No depression or anxiety. Mood stable.     Physical Exam:  BP 126/70 (BP Location: Right Arm, Cuff Size: Normal)    Pulse (!) 55    Ht 5\' 9"  (1.753 m)    Wt 137 lb 9.6 oz (62.4 kg)    SpO2 97%    BMI 20.32 kg/m   GEN: Pleasant, interactive, well-nourished; in no acute distress. HEENT:  Normocephalic and atraumatic. EACs patent bilaterally. TM pearly gray with present light reflex bilaterally. PERRLA. Sclera white. Nasal turbinates pink, moist and patent bilaterally. No rhinorrhea present. Oropharynx pink and moist, without exudate or edema. No lesions, ulcerations, or postnasal drip.  NECK:  Supple w/ fair ROM. No JVD present. Normal carotid impulses w/o bruits. Thyroid symmetrical with no goiter or nodules palpated. No lymphadenopathy.   CV: RRR, no m/r/g, no peripheral edema. Pulses intact, +2 bilaterally. No cyanosis, pallor or clubbing. PULMONARY:  Unlabored, regular breathing. Diminished breath sounds b/l bases R>L; Clear anteriorly w/o wheezes/rales/rhonchi. No accessory muscle use. No dullness to percussion. GI: BS present and normoactive. Soft, non-tender to palpation. No organomegaly or masses detected. No CVA tenderness. MSK: No erythema, warmth or tenderness. Cap refil <2 sec all extrem. No deformities or joint swelling noted.  Neuro: A/Ox3. No focal deficits noted.   Skin: Warm, no lesions or rashe Psych: Normal affect and behavior. Judgement and thought content appropriate.     Lab Results:  CBC    Component Value Date/Time   WBC 7.8 06/11/2021 1015   RBC 4.39 06/11/2021 1015   HGB 13.8 06/11/2021 1015   HGB 12.7 (L) 09/08/2019 1121   HCT 40.9 06/11/2021 1015   HCT 36.8 (L) 09/08/2019 1121   PLT 191 06/11/2021 1015   PLT 191 09/08/2019 1121   MCV 93.2 06/11/2021 1015   MCV 92 09/08/2019 1121   MCH 31.4 06/11/2021 1015   MCHC 33.7 06/11/2021 1015   RDW 14.0 06/11/2021 1015   RDW 11.8  09/08/2019 1121   LYMPHSABS 2.3 06/11/2021 1015   MONOABS 0.6 06/11/2021 1015   EOSABS 0.0 06/11/2021 1015   BASOSABS 0.0 06/11/2021 1015    BMET    Component Value Date/Time   NA 129 (L) 06/11/2021 1015   NA 126 (L) 04/11/2021 1421   K 3.9 06/11/2021 1015   CL 95 (L) 06/11/2021 1015   CO2 22 06/11/2021 1015   GLUCOSE 158 (H) 06/11/2021 1015   BUN 21 06/11/2021 1015   BUN 21 04/11/2021 1421   CREATININE 1.18 06/11/2021 1015   CALCIUM 9.8 06/11/2021 1015   GFRNONAA 59 (L) 06/11/2021 1015   GFRAA >60 10/10/2019 0203    BNP  Component Value Date/Time   BNP 2,199.4 (H) 06/11/2021 1018     Imaging:  DG Chest 2 View  Result Date: 05/18/2021 CLINICAL DATA:  Right pleural effusion. EXAM: CHEST - 2 VIEW COMPARISON:  Chest radiograph dated May 09, 2021 FINDINGS: The heart size and mediastinal contours are within normal limits. Right basilar opacity likely representing atelectasis. Trace right pleural effusion, which appears to have decreased since prior examination. Osteopenia with mild thoracic kyphosis. IMPRESSION: 1. Trace right pleural effusion, which appears to have decreased since prior examination. Right basilar atelectasis. No appreciable pneumothorax. Electronically Signed   By: Keane Police D.O.   On: 05/18/2021 14:51   CT ABDOMEN PELVIS W CONTRAST  Result Date: 06/11/2021 CLINICAL DATA:  Constipation EXAM: CT ABDOMEN AND PELVIS WITH CONTRAST TECHNIQUE: Multidetector CT imaging of the abdomen and pelvis was performed using the standard protocol following bolus administration of intravenous contrast. RADIATION DOSE REDUCTION: This exam was performed according to the departmental dose-optimization program which includes automated exposure control, adjustment of the mA and/or kV according to patient size and/or use of iterative reconstruction technique. CONTRAST:  136mL OMNIPAQUE IOHEXOL 300 MG/ML  SOLN COMPARISON:  CT abdomen/pelvis 04/24/2021 FINDINGS: Lower chest: There  are bilateral pleural effusions with adjacent atelectasis, slightly decreased in size since the prior CT from 04/24/2021. The heart is enlarged. A mitral valve prosthesis is noted. Hepatobiliary: The liver and gallbladder are unremarkable. There is no biliary ductal dilatation. Pancreas: Unremarkable. Spleen: Unremarkable. Adrenals/Urinary Tract: The adrenals are unremarkable. The kidneys are unremarkable, with no focal lesion, stone, hydronephrosis, or hydroureter. There is symmetric excretion of contrast into the collecting systems on the delayed images. The bladder is unremarkable. Stomach/Bowel: The stomach is unremarkable. There is a large stool burden in the rectum with moderate stool throughout the remainder of the colon. There are no findings to suggest definite stercoral colitis. There is colonic diverticulosis without evidence of acute diverticulitis. Appendix is not definitively identified. Vascular/Lymphatic: There is scattered calcified atherosclerotic plaque throughout the nonaneurysmal abdominal aorta. The major branch vessels are patent. The main portal and splenic veins are patent. Reproductive: Prostate is markedly enlarged and impresses upon the inferior aspect of the bladder. Other: There is trace free fluid in the presacral space. There is no evidence of organized or drainable fluid collection. There is no free intraperitoneal air. Musculoskeletal: Mildly displaced fractures of the right eighth through eleventh ribs are unchanged, with 2 separate fractures of the ninth through eleventh ribs identified. There is no new acute osseous abnormality. There is no aggressive osseous lesion. IMPRESSION: 1. Large stool burden in the rectum with moderate stool throughout the remainder of the colon without findings to suggest definite stercoral colitis. 2. Bilateral pleural effusions with adjacent atelectasis, slightly decreased in size since the prior CT from 04/24/2021. 3. Colonic diverticulosis without  evidence of acute diverticulitis. 4. Trace free fluid in the presacral space, nonspecific. 5. Unchanged mildly displaced fractures of the right eighth through eleventh ribs. 6. Marked prostatomegaly. 7. Cardiomegaly. Aortic Atherosclerosis (ICD10-I70.0). Electronically Signed   By: Valetta Mole M.D.   On: 06/11/2021 11:58   DG Chest Portable 1 View  Result Date: 06/11/2021 CLINICAL DATA:  Pts family states patient has had constipation since December, lbm 3 days ago. Pt states it has involved his urination as well,to the point he has hard time voiding. Pt also then states he has been short of breath for past few days. Pt 's baseline since December has been short of breath, difficult to ascertain from  family if has worsened . EXAM: PORTABLE CHEST 1 VIEW COMPARISON:  05/18/2021. FINDINGS: Cardiac silhouette borderline enlarged. No mediastinal or hilar masses. Lungs are hyperexpanded. Interstitial thickening and peribronchovascular opacities are noted in the lung bases increased when compared to the most recent prior exam. Interstitial prominence throughout the remainder of the lungs is stable. Small effusion suspected.  No pneumothorax. Skeletal structures grossly intact. IMPRESSION: 1. Lung base opacities consistent with atelectasis or pneumonia, somewhat greater on the left, increased compared to the most recent prior chest radiograph. 2. Lung hyperexpansion and chronic interstitial thickening suggesting underlying COPD. Electronically Signed   By: Lajean Manes M.D.   On: 06/11/2021 11:41      No flowsheet data found.  No results found for: NITRICOXIDE      Assessment & Plan:   Recurrent pleural effusion Recurrent b/l small pleural effusions. Increased SOB suspect multifactorial r/t HF, pulm edema and pleural effusions. Will diurese with lasix 20 mg for 5 days. Advised to monitor BP and notify if <100/60 or if dizziness develops. Follow up with cardiology d/t extreme elevation in BNP and  recurrence of pleural effusions.   Patient Instructions  -Lasix 20 mg daily for five days. Take in AM. Monitor BP and notify if <100/60. Change positions slowly and notify if dizziness occurs.  -Augmentin 875 Twice daily for 7 days. Notify immediately of any rash, itching, hives, or swelling, or seek emergency care.Finish your antibiotics in their entirety. Do not stop just because symptoms improve. Take with food to reduce GI upset.  Follow up with cardiology to discuss heart failure, elevated BNP and recurrent pleural effusions.   I will discuss repeating echocardiogram with cardiology and Dr. Erin Fulling and let you know if we move forward with that.   Follow up in one week with Dr. Erin Fulling or Alanson Aly. If symptoms do not improve or worsen, please contact office for sooner follow up or seek emergency care.    Chronic diastolic (congestive) heart failure (HCC) Suspect recurrence of pleural effusions and pulm edema r/t HF given significant elevation in BNP from a year ago (245.8 --> 2199.4). Will diurese and advised cards follow up for further management.   Pneumonia Increased lung base opacities L>R, concern for pulmonary edema vs atelectasis vs pna. Will cover with augmentin Twice daily for 7 days given risk for pna with recent rib fractures and hospitalization.    Clayton Bibles, NP 06/14/2021  Pt aware and understands NP's role.

## 2021-06-14 ENCOUNTER — Telehealth: Payer: Self-pay

## 2021-06-14 ENCOUNTER — Encounter: Payer: Self-pay | Admitting: Nurse Practitioner

## 2021-06-14 DIAGNOSIS — J189 Pneumonia, unspecified organism: Secondary | ICD-10-CM | POA: Insufficient documentation

## 2021-06-14 NOTE — Assessment & Plan Note (Signed)
Suspect recurrence of pleural effusions and pulm edema r/t HF given significant elevation in BNP from a year ago (245.8 --> 2199.4). Will diurese and advised cards follow up for further management.

## 2021-06-14 NOTE — Telephone Encounter (Signed)
Called pt and scheduled 1st available f/u appt Angie Duke, PA-C at [facility] at 1120am pt will arrive early.

## 2021-06-14 NOTE — Assessment & Plan Note (Signed)
Recurrent b/l small pleural effusions. Increased SOB suspect multifactorial r/t HF, pulm edema and pleural effusions. Will diurese with lasix 20 mg for 5 days. Advised to monitor BP and notify if <100/60 or if dizziness develops. Follow up with cardiology d/t extreme elevation in BNP and recurrence of pleural effusions.   Patient Instructions  -Lasix 20 mg daily for five days. Take in AM. Monitor BP and notify if <100/60. Change positions slowly and notify if dizziness occurs.  -Augmentin 875 Twice daily for 7 days. Notify immediately of any rash, itching, hives, or swelling, or seek emergency care.Finish your antibiotics in their entirety. Do not stop just because symptoms improve. Take with food to reduce GI upset.  Follow up with cardiology to discuss heart failure, elevated BNP and recurrent pleural effusions.   I will discuss repeating echocardiogram with cardiology and Dr. Erin Fulling and let you know if we move forward with that.   Follow up in one week with Dr. Erin Fulling or Alanson Aly. If symptoms do not improve or worsen, please contact office for sooner follow up or seek emergency care.

## 2021-06-14 NOTE — Telephone Encounter (Signed)
-----   Message from Deberah Pelton, NP sent at 06/14/2021  9:07 AM EST ----- Please contact this patient and set him up with a cardiology appointment.  It can be any APP or primary cardiologist.  Please put him on the callback list and schedule for next available appointment 1-2 wks if possible.   ----- Message ----- From: Clayton Bibles, NP Sent: 06/14/2021   8:44 AM EST To: Deberah Pelton, NP  Precious Haws, We previously chatted about this patient. I believe he saw you 1/11. He was seen in the ED on 1/28 for worsening SOB (as well as constipation) and imaging showed recurrence of b/l pleural effusions as well as some pulm edema. He also had a significant elevation in his BNP to 2,199 from the 200's a year ago. He is still not on any diuretic regimen. I started him on lasix 20 mg for 5 days but I believe he is going to need to be restarted on his hctz or other. Can you set him up with an appt for further management of this? I've advised them to call you all this week as well.   Thanks! [employer]

## 2021-06-14 NOTE — Assessment & Plan Note (Addendum)
Increased lung base opacities L>R, concern for pulmonary edema vs atelectasis vs pna. Will cover with augmentin Twice daily for 7 days given risk for pna with recent rib fractures and hospitalization.

## 2021-06-15 ENCOUNTER — Ambulatory Visit: Payer: Medicare HMO | Admitting: Interventional Cardiology

## 2021-06-17 DIAGNOSIS — S272XXD Traumatic hemopneumothorax, subsequent encounter: Secondary | ICD-10-CM | POA: Diagnosis not present

## 2021-06-17 DIAGNOSIS — E039 Hypothyroidism, unspecified: Secondary | ICD-10-CM | POA: Diagnosis not present

## 2021-06-17 DIAGNOSIS — N1831 Chronic kidney disease, stage 3a: Secondary | ICD-10-CM | POA: Diagnosis not present

## 2021-06-17 DIAGNOSIS — S2241XD Multiple fractures of ribs, right side, subsequent encounter for fracture with routine healing: Secondary | ICD-10-CM | POA: Diagnosis not present

## 2021-06-17 DIAGNOSIS — E1122 Type 2 diabetes mellitus with diabetic chronic kidney disease: Secondary | ICD-10-CM | POA: Diagnosis not present

## 2021-06-17 DIAGNOSIS — E785 Hyperlipidemia, unspecified: Secondary | ICD-10-CM | POA: Diagnosis not present

## 2021-06-17 DIAGNOSIS — I130 Hypertensive heart and chronic kidney disease with heart failure and stage 1 through stage 4 chronic kidney disease, or unspecified chronic kidney disease: Secondary | ICD-10-CM | POA: Diagnosis not present

## 2021-06-17 DIAGNOSIS — I5033 Acute on chronic diastolic (congestive) heart failure: Secondary | ICD-10-CM | POA: Diagnosis not present

## 2021-06-17 DIAGNOSIS — R69 Illness, unspecified: Secondary | ICD-10-CM | POA: Diagnosis not present

## 2021-06-21 ENCOUNTER — Encounter: Payer: Self-pay | Admitting: Nurse Practitioner

## 2021-06-21 ENCOUNTER — Other Ambulatory Visit: Payer: Self-pay

## 2021-06-21 ENCOUNTER — Ambulatory Visit: Payer: Medicare HMO | Admitting: Nurse Practitioner

## 2021-06-21 ENCOUNTER — Telehealth: Payer: Self-pay | Admitting: Nurse Practitioner

## 2021-06-21 ENCOUNTER — Ambulatory Visit (INDEPENDENT_AMBULATORY_CARE_PROVIDER_SITE_OTHER): Payer: Medicare HMO

## 2021-06-21 VITALS — BP 120/68 | HR 61 | Temp 98.0°F | Ht 69.0 in | Wt 141.6 lb

## 2021-06-21 DIAGNOSIS — I5032 Chronic diastolic (congestive) heart failure: Secondary | ICD-10-CM

## 2021-06-21 DIAGNOSIS — R059 Cough, unspecified: Secondary | ICD-10-CM | POA: Diagnosis not present

## 2021-06-21 DIAGNOSIS — R0609 Other forms of dyspnea: Secondary | ICD-10-CM

## 2021-06-21 DIAGNOSIS — R0602 Shortness of breath: Secondary | ICD-10-CM | POA: Diagnosis not present

## 2021-06-21 DIAGNOSIS — J00 Acute nasopharyngitis [common cold]: Secondary | ICD-10-CM | POA: Diagnosis not present

## 2021-06-21 DIAGNOSIS — J90 Pleural effusion, not elsewhere classified: Secondary | ICD-10-CM

## 2021-06-21 MED ORDER — ALBUTEROL SULFATE HFA 108 (90 BASE) MCG/ACT IN AERS: 2.0000 | INHALATION_SPRAY | Freq: Four times a day (QID) | RESPIRATORY_TRACT | 2 refills | Status: AC | PRN

## 2021-06-21 MED ORDER — FLUTICASONE PROPIONATE 50 MCG/ACT NA SUSP: 2.0000 | Freq: Every day | NASAL | 2 refills | Status: DC

## 2021-06-21 MED ORDER — CETIRIZINE HCL 10 MG PO TABS: 10.0000 mg | ORAL_TABLET | Freq: Every day | ORAL | 3 refills | Status: AC

## 2021-06-21 NOTE — Assessment & Plan Note (Signed)
Recent BNP increased to 2199.4 from 245.8 one year ago. Persistent pleural effusions. Previously had some swelling in LE. Responsive to lasix. Follow up with cardiology tomorrow for further management.

## 2021-06-21 NOTE — Assessment & Plan Note (Signed)
Reports occasional allergy symptoms with current flare. Advised to start Zyrtec daily and flonase nasal spray. Continue saline nasal sprays.

## 2021-06-21 NOTE — Assessment & Plan Note (Addendum)
Persistent b/l small pleural effusions r/t HF. CXR today stable.  Walking oximetry without desaturation.  Patient Instructions  -Albuterol 2 puffs every 6 hours as needed for shortness of breath -Zyrtec (cetirizine) 10 mg daily -Flonase 2 puffs each nostril daily for nasal congestion/runny nose   Follow up with cardiology tomorrow to discuss heart failure, elevated BNP and recurrent pleural effusions.   Pulmonary function testing scheduled today.  Chest x ray today. We will notify you of any abnormal results.    Follow up as scheduled with Dr. Erin Fulling next week. If symptoms do not improve or worsen, please contact office for sooner follow up or seek emergency care.

## 2021-06-21 NOTE — Progress Notes (Signed)
@Patient  ID: Adrian Olson, male    DOB: 1930/06/19, 88 y.o.   MRN: [id number]  Chief Complaint  Patient presents with   Follow-up    1 week follow up. Pt states that even though being on lasix he doesn't feel a different in the urinating. He states that he feels the same     Referring provider: Merrilee Seashore, MD  HPI: 88 year old male, never smoker follow-up for pleural effusion.  He is a patient of Dr. August Albino and was last seen in office on 06/13/2021 by [facility] NP.  Past medical history significant for bilateral carotid artery disease, DM 2, ED, hyperlipidemia, hypertension, lumbar disc disease, MVP status post mitral valve clip, CHF.  TEST/EVENTS:  04/24/2021 CTA chest: Mild to moderate cardiomegaly.  Prosthetic mitral valve noted.  Atherosclerosis.  Moderate to large right pleural fluid collection and small to moderate left fluid collection with associated bibasilar atelectasis.  Multiple associated mildly displaced and slightly comminuted fractures of posterior lateral right ribs 6 through 11. 04/26/2021 echocardiogram: EF 55 to 60%.  Mild concentric LVH.  Unable to evaluate LV diastolic function.  Left and right atrium moderately dilated.  Moderate pleural effusion in the left lateral region.  MV previously repaired/replaced with moderate MVR.  Mild AVR. 05/18/2021 CXR 2 view: Trace right pleural effusion, decreased in size compared to prior.  Right basilar atelectasis. 06/11/2021 CXR 1 view: Lungs hyperexpanded.  Interstitial thickening and peribronchovascular opacities are noted in the lung bases, increased when compared to prior exam.  Interstitial prominence throughout the remainder of the lungs is stable.  Small effusion suspected.  05/03/2021: OV with Dr. Erin Fulling for hospital follow-up for pleural effusion post fall.  Admitted 12/11-12/13.  Noted to be symptomatic at visit with shortness of breath and mild respiratory distress.  Directly admitted to the hospital due to need for  further intervention.  05/03/2021 to 05/09/2021: Hospital admission for recurrent right pleural effusion.  Treated with right pigtail catheter.  Pleural fluid with low protein, high LDH.  Negative cytology and culture.  Treated with Doxy for possible subclinical pneumonia.  HCTZ was previously discontinued by cardiology due to hyponatremia; however, during hospitalization this was noted to be a chronic problem and did not have any evidence of SIADH.  05/18/2021: OV with NP for hospital follow-up.  Resolution of symptoms.  Was having some fatigue; suspected possibly related to deconditioning posthospitalization.  CXR with trace right pleural effusion, decreased in size when compared to previous.  Advised he follow-up with cardiology to restart HCTZ.  Walking oximetry without desaturation.  06/13/2021: OV with [facility] NP for follow-up after being seen in the ED for shortness of breath and constipation.  Found to have bilateral small pleural effusions and BNP was elevated to 2199.4 pg/mL in ED. reported dyspnea upon minimal exertion at visit.  Started on Lasix 20 mg for 5 days.  Possible pneumonia on CXR treated with 7-day course of Augmentin.  Reached out to cardiology who scheduled visit for further evaluation given increase in BNP and pulmonary edema, pleural effusions.  06/21/2021: Today-follow-up Patient presents today with daughter and wife for follow-up.  He continues to have some shortness of breath upon exertion.  He did notice some improvement with the lasix.  He describe his cough more as a throat tickle with the feeling of needing to clear his throat. HE has had increased nasal drainage and congestion over the past few days.  He denies any recent sick exposures or fevers.  He denies any wheezing, orthopnea,  chest pain, lower extremity swelling.  He completed his augmentin course yesterday. He is not currently on any maintenance inhalers and has not tried albuterol previously.  He is supposed to follow-up  with cardiology tomorrow.  Allergies  Allergen Reactions   Duloxetine Hcl Other (See Comments)    withdrawal    Immunization History  Administered Date(s) Administered   Fluad Quad(high Dose 65+) 02/18/2021   PFIZER(Purple Top)SARS-COV-2 Vaccination 06/19/2019, 07/15/2019    Past Medical History:  Diagnosis Date   Bilateral carotid artery disease (HCC)    Severe Left and moderate Right   Diabetes mellitus without complication [facility])    ED (erectile dysfunction)    Hyperlipidemia    Hypertension    Lumbar disc disease    Mitral valve prolapse    moderate to severe MR, LVEF 55% Echo 2010   Moderate mitral regurgitation 03/16/2014   S/P mitral valve clip implantation 10/09/2019   s/p TEER using 2 MitraClip XTW devices, first device positioned A2 P2, second device positioned A1 P1 by Dr. Burt Knack    Tobacco History: Social History   Tobacco Use  Smoking Status Never  Smokeless Tobacco Never   Counseling given: Not Answered   Outpatient Medications Prior to Visit  Medication Sig Dispense Refill   aspirin 81 MG EC tablet Take 81 mg by mouth daily.     Cholecalciferol (VITAMIN D) 2000 UNITS tablet Take 2,000 Units by mouth daily at 12 noon.      Cyanocobalamin (VITAMIN B-12) 2000 MCG TBCR 1 tablet     finasteride (PROSCAR) 5 MG tablet Take 5 mg by mouth daily at 12 noon.      glycerin adult 2 g suppository Place 1 suppository rectally as needed for constipation. 12 suppository 0   levothyroxine (SYNTHROID, LEVOTHROID) 75 MCG tablet Take 75 mcg by mouth daily before breakfast.     metFORMIN (GLUCOPHAGE) 500 MG tablet Take 500 mg by mouth every evening.      metoprolol tartrate (LOPRESSOR) 25 MG tablet Take 0.5 tablets (12.5 mg total) by mouth 2 (two) times daily. 90 tablet 3   polyethylene glycol (GOLYTELY) 236 g solution Drink 240 ml every 15 minutes for bowel movement up until 1 L. If still no bowel movement wait 90 min before repeating. 2000 mL 0   polyethylene glycol powder  (GLYCOLAX/MIRALAX) 17 GM/SCOOP powder Take 255 g by mouth daily for 14 doses. 255 g 0   pravastatin (PRAVACHOL) 20 MG tablet Take 20 mg by mouth at bedtime.      psyllium (METAMUCIL) 58.6 % packet Take 1 packet by mouth daily.     ramipril (ALTACE) 5 MG capsule Take 5 mg by mouth daily.      sodium chloride (OCEAN) 0.65 % SOLN nasal spray Place 1 spray into both nostrils daily as needed for congestion.     tamsulosin (FLOMAX) 0.4 MG CAPS capsule Take 0.4 mg by mouth daily at 12 noon.      Thiamine HCl (VITAMIN B1 PO) Take 125 mcg by mouth daily.     venlafaxine XR (EFFEXOR-XR) 75 MG 24 hr capsule Take 1 capsule (75 mg total) by mouth daily with breakfast.     amoxicillin-clavulanate (AUGMENTIN) 875-125 MG tablet Take 1 tablet by mouth 2 (two) times daily. 14 tablet 0   furosemide (LASIX) 20 MG tablet Take 1 tablet (20 mg total) by mouth daily for 5 days. 5 tablet 0   No facility-administered medications prior to visit.     Review of Systems:  Constitutional: No weight loss or gain, night sweats, fevers, chills. +fatigue. HEENT: No headaches, difficulty swallowing, tooth/dental problems, or sore throat. No sneezing, itching, ear ache. +nasal congestion; rhinorrhea CV:  No chest pain, orthopnea, PND, swelling in lower extremities, anasarca, dizziness, palpitations, syncope Resp: +shortness of breath with exertion; occasional cough/throat clearing. No excess mucus or change in color of mucus.  No hemoptysis. No wheezing.  No chest wall deformity GI:  No heartburn, indigestion, abdominal pain, nausea, vomiting, diarrhea, change in bowel habits, loss of appetite, bloody stools.  GU: No dysuria, change in color of urine, urgency or frequency.  No flank pain, no hematuria  Skin: No rash, lesions, ulcerations MSK:  No joint pain or swelling.  No decreased range of motion.  No back pain. Neuro: No dizziness or lightheadedness.  Psych: No depression or anxiety. Mood stable.     Physical  Exam:  BP 120/68 (BP Location: Left Arm, Patient Position: Sitting, Cuff Size: Normal)    Pulse 61    Temp 98 F (36.7 C) (Oral)    Ht 5\' 9"  (1.753 m)    Wt 141 lb 9.6 oz (64.2 kg)    SpO2 98%    BMI 20.91 kg/m   GEN: Pleasant, interactive, well-nourished; in no acute distress. HEENT:  Normocephalic and atraumatic. EACs patent bilaterally. TM pearly gray with present light reflex bilaterally. PERRLA. Sclera white. Nasal turbinates pink, moist and patent bilaterally. Clear rhinorrhea present. Oropharynx pink and moist, without exudate or edema. No lesions, ulcerations, or postnasal drip.  NECK:  Supple w/ fair ROM. No JVD present. Normal carotid impulses w/o bruits. Thyroid symmetrical with no goiter or nodules palpated. No lymphadenopathy.   CV: RRR, systolic murmur, no peripheral edema. Pulses intact, +2 bilaterally. No cyanosis, pallor or clubbing. PULMONARY:  Unlabored, regular breathing. Clear bilaterally A&P w/o wheezes/rales/rhonchi. No accessory muscle use. No dullness to percussion. GI: BS present and normoactive. Soft, non-tender to palpation. No organomegaly or masses detected. No CVA tenderness. MSK: No erythema, warmth or tenderness. Cap refil <2 sec all extrem. No deformities or joint swelling noted.  Neuro: A/Ox3. No focal deficits noted.   Skin: Warm, no lesions or rashe Psych: Normal affect and behavior. Judgement and thought content appropriate.     Lab Results:  CBC    Component Value Date/Time   WBC 7.8 06/11/2021 1015   RBC 4.39 06/11/2021 1015   HGB 13.8 06/11/2021 1015   HGB 12.7 (L) 09/08/2019 1121   HCT 40.9 06/11/2021 1015   HCT 36.8 (L) 09/08/2019 1121   PLT 191 06/11/2021 1015   PLT 191 09/08/2019 1121   MCV 93.2 06/11/2021 1015   MCV 92 09/08/2019 1121   MCH 31.4 06/11/2021 1015   MCHC 33.7 06/11/2021 1015   RDW 14.0 06/11/2021 1015   RDW 11.8 09/08/2019 1121   LYMPHSABS 2.3 06/11/2021 1015   MONOABS 0.6 06/11/2021 1015   EOSABS 0.0 06/11/2021 1015    BASOSABS 0.0 06/11/2021 1015    BMET    Component Value Date/Time   NA 129 (L) 06/11/2021 1015   NA 126 (L) 04/11/2021 1421   K 3.9 06/11/2021 1015   CL 95 (L) 06/11/2021 1015   CO2 22 06/11/2021 1015   GLUCOSE 158 (H) 06/11/2021 1015   BUN 21 06/11/2021 1015   BUN 21 04/11/2021 1421   CREATININE 1.18 06/11/2021 1015   CALCIUM 9.8 06/11/2021 1015   GFRNONAA 59 (L) 06/11/2021 1015   GFRAA >60 10/10/2019 0203    BNP  Component Value Date/Time   BNP 2,199.4 (H) 06/11/2021 1018     Imaging:  DG Chest 2 View  Result Date: 06/21/2021 CLINICAL DATA:  Increased shortness of breath, cough EXAM: CHEST - 2 VIEW COMPARISON:  06/11/2021 FINDINGS: Small bilateral pleural effusions. Bibasilar patchy opacities. Chronic interstitial changes. Stable cardiomediastinal contours. IMPRESSION: Small bilateral pleural effusions. Patchy caliectasis/consolidation at the bases. Electronically Signed   By: Macy Mis M.D.   On: 06/21/2021 12:00   CT ABDOMEN PELVIS W CONTRAST  Result Date: 06/11/2021 CLINICAL DATA:  Constipation EXAM: CT ABDOMEN AND PELVIS WITH CONTRAST TECHNIQUE: Multidetector CT imaging of the abdomen and pelvis was performed using the standard protocol following bolus administration of intravenous contrast. RADIATION DOSE REDUCTION: This exam was performed according to the departmental dose-optimization program which includes automated exposure control, adjustment of the mA and/or kV according to patient size and/or use of iterative reconstruction technique. CONTRAST:  174mL OMNIPAQUE IOHEXOL 300 MG/ML  SOLN COMPARISON:  CT abdomen/pelvis 04/24/2021 FINDINGS: Lower chest: There are bilateral pleural effusions with adjacent atelectasis, slightly decreased in size since the prior CT from 04/24/2021. The heart is enlarged. A mitral valve prosthesis is noted. Hepatobiliary: The liver and gallbladder are unremarkable. There is no biliary ductal dilatation. Pancreas: Unremarkable. Spleen:  Unremarkable. Adrenals/Urinary Tract: The adrenals are unremarkable. The kidneys are unremarkable, with no focal lesion, stone, hydronephrosis, or hydroureter. There is symmetric excretion of contrast into the collecting systems on the delayed images. The bladder is unremarkable. Stomach/Bowel: The stomach is unremarkable. There is a large stool burden in the rectum with moderate stool throughout the remainder of the colon. There are no findings to suggest definite stercoral colitis. There is colonic diverticulosis without evidence of acute diverticulitis. Appendix is not definitively identified. Vascular/Lymphatic: There is scattered calcified atherosclerotic plaque throughout the nonaneurysmal abdominal aorta. The major branch vessels are patent. The main portal and splenic veins are patent. Reproductive: Prostate is markedly enlarged and impresses upon the inferior aspect of the bladder. Other: There is trace free fluid in the presacral space. There is no evidence of organized or drainable fluid collection. There is no free intraperitoneal air. Musculoskeletal: Mildly displaced fractures of the right eighth through eleventh ribs are unchanged, with 2 separate fractures of the ninth through eleventh ribs identified. There is no new acute osseous abnormality. There is no aggressive osseous lesion. IMPRESSION: 1. Large stool burden in the rectum with moderate stool throughout the remainder of the colon without findings to suggest definite stercoral colitis. 2. Bilateral pleural effusions with adjacent atelectasis, slightly decreased in size since the prior CT from 04/24/2021. 3. Colonic diverticulosis without evidence of acute diverticulitis. 4. Trace free fluid in the presacral space, nonspecific. 5. Unchanged mildly displaced fractures of the right eighth through eleventh ribs. 6. Marked prostatomegaly. 7. Cardiomegaly. Aortic Atherosclerosis (ICD10-I70.0). Electronically Signed   By: Valetta Mole M.D.   On:  06/11/2021 11:58   DG Chest Portable 1 View  Result Date: 06/11/2021 CLINICAL DATA:  Pts family states patient has had constipation since December, lbm 3 days ago. Pt states it has involved his urination as well,to the point he has hard time voiding. Pt also then states he has been short of breath for past few days. Pt 's baseline since December has been short of breath, difficult to ascertain from family if has worsened . EXAM: PORTABLE CHEST 1 VIEW COMPARISON:  05/18/2021. FINDINGS: Cardiac silhouette borderline enlarged. No mediastinal or hilar masses. Lungs are hyperexpanded. Interstitial thickening and peribronchovascular opacities are noted  in the lung bases increased when compared to the most recent prior exam. Interstitial prominence throughout the remainder of the lungs is stable. Small effusion suspected.  No pneumothorax. Skeletal structures grossly intact. IMPRESSION: 1. Lung base opacities consistent with atelectasis or pneumonia, somewhat greater on the left, increased compared to the most recent prior chest radiograph. 2. Lung hyperexpansion and chronic interstitial thickening suggesting underlying COPD. Electronically Signed   By: Lajean Manes M.D.   On: 06/11/2021 11:41      No flowsheet data found.  No results found for: NITRICOXIDE      Assessment & Plan:   Recurrent pleural effusion Persistent b/l small pleural effusions r/t HF. CXR today stable.  Walking oximetry without desaturation.  Patient Instructions  -Albuterol 2 puffs every 6 hours as needed for shortness of breath -Zyrtec (cetirizine) 10 mg daily -Flonase 2 puffs each nostril daily for nasal congestion/runny nose   Follow up with cardiology tomorrow to discuss heart failure, elevated BNP and recurrent pleural effusions.   Pulmonary function testing scheduled today.  Chest x ray today. We will notify you of any abnormal results.    Follow up as scheduled with Dr. Erin Fulling next week. If symptoms do not  improve or worsen, please contact office for sooner follow up or seek emergency care.    DOE (dyspnea on exertion) Suspect DOE primarily related to HF given significant increase in BNP, pleural effusions and some pulmonary edema.  He is anticipated to see cardiology tomorrow.  Would recommend restarting daily diuretic regimen given hyponatremia appears chronic and no evidence of SIADH.  Will obtain PFTs to further rule out pulmonary etiology given hyperexpansion and some interstitial thickening on imaging.  Trial albuterol as needed for shortness of breath.  Chronic diastolic (congestive) heart failure (HCC) Recent BNP increased to 2199.4 from 245.8 one year ago. Persistent pleural effusions. Previously had some swelling in LE. Responsive to lasix. Follow up with cardiology tomorrow for further management.   Acute rhinitis Reports occasional allergy symptoms with current flare. Advised to start Zyrtec daily and flonase nasal spray. Continue saline nasal sprays.      Clayton Bibles, NP 06/21/2021  Pt aware and understands NP's role.

## 2021-06-21 NOTE — Progress Notes (Signed)
Office Visit    Patient Name: Adrian Olson Date of Encounter: 06/22/2021  Primary Care Provider:  Merrilee Seashore, MD Primary Cardiologist:  [name], MD  Chief Complaint    88 year old male with a history of carotid artery stenosis, mitral valve prolapse s/p mitral valve clip, SVT/VT, hypertension, hyperlipidemia, recurrent right pleural effusion, type 2 diabetes, and lumbar disc disease who presents for follow-up post ED visit related to dyspnea.   Past Medical History    Past Medical History:  Diagnosis Date   Bilateral carotid artery disease (HCC)    Severe Left and moderate Right   Diabetes mellitus without complication [facility])    ED (erectile dysfunction)    Hyperlipidemia    Hypertension    Lumbar disc disease    Mitral valve prolapse    moderate to severe MR, LVEF 55% Echo 2010   Moderate mitral regurgitation 03/16/2014   S/P mitral valve clip implantation 10/09/2019   s/p TEER using 2 MitraClip XTW devices, first device positioned A2 P2, second device positioned A1 P1 by Dr. Burt Knack   Past Surgical History:  Procedure Laterality Date   CARDIAC CATHETERIZATION     MITRAL VALVE REPAIR  10/09/2019   MITRAL VALVE REPAIR (N/A )   MITRAL VALVE REPAIR N/A 10/09/2019   Procedure: MITRAL VALVE REPAIR;  Surgeon: Sherren Mocha, MD;  Location: [facility];  Service: Cardiovascular;  Laterality: N/A;   RIGHT/LEFT HEART CATH AND CORONARY ANGIOGRAPHY N/A 09/16/2019   Procedure: RIGHT/LEFT HEART CATH AND CORONARY ANGIOGRAPHY;  Surgeon: Belva Crome, MD;  Location: [facility];  Service: Cardiovascular;  Laterality: N/A;   TEE WITHOUT CARDIOVERSION N/A 09/16/2019   Procedure: TRANSESOPHAGEAL ECHOCARDIOGRAM (TEE);  Surgeon: Lelon Perla, MD;  Location: [facility];  Service: Cardiovascular;  Laterality: N/A;    Allergies  Allergies  Allergen Reactions   Duloxetine Hcl Other (See Comments)    withdrawal    History of Present Illness     88 year old male with the above past medical history including carotid artery stenosis, mitral valve prolapse s/p mitral valve clip, SVT/VT, hypertension, hyperlipidemia, recurrent right pleural effusion, type 2 diabetes, and lumbar disc disease.   He has a history of mitral valve prolapse s/p mitral valve clip May 2021.  Cardiac catheterization prior to surgery showed moderate CAD (o-mLAD 40%, mLAD 65%, pRCA 20%). Additionally, he has a history of bilateral carotid artery stenosis.  Most recent carotid duplex in July 2022 with stable 40 to 59% B ICA stenosis.  With repeat study recommended in 1 year.  He had a syncopal episode in early December 2022, fall with injury. He was hospitalized in the setting of traumatic hemothorax, s/p R thoracentesis. Monitor showed SVT, 6 beat non-sustained VT.  He was hospitalized again from 05/03/21-05/09/21 with recurrent right pleural effusion. A pigtail catheter was placed, cytology and culture were negative. He was treated with antibiotics for possible subclinical pneumonia. Echocardiogram in December 2022 showed EF 55 to 60%, mild concentric LVH, indeterminate diastolic function, elevated left atrial pressure, moderate BAE, moderate MR, with well-positioned mitral clip.  At follow-up with pulmonology in early January 2023, he reported ongoing dyspnea with exertion.  He was last seen in the office on May 25, 2021 and was stable overall though he did report ongoing dyspnea.    Unfortunately, he presented to the ED on 06/11/2021 with complaints of constipation or shortness of breath. BNP was elevated at 2199.4. Chest x-ray showed small pleural effusions decreased in  size from previous. Troponin was mildly elevated with flat trend (29 >). He was treated for constipation. He was discharged home and advised to follow-up with pulmonology as an outpatient. He followed up with his PCP on 06/13/2021 and was started on Lasix 20 mg daily for 5 days and treated with antibiotics for  possible pneumonia, possible acute diastolic heart failure. Follow-up chest x-ray per PCP on 06/21/2021 showed small bilateral pleural effusions, no change from prior chest x-ray.  He presents today for follow-up accompanied by his wife and daughter. Since his recent ED visit he reports ongoing dyspnea on exertion, unchanged. He denies pnd, orthopnea, edema, weight gain. He does report a decreased appetite. He is concerned that over the past 2 weeks he has had increased fatigue, weakness, increased daytime somnolence. He falls asleep readily and spilled coffee on himself the other morning as he fell asleep. His daughter is requesting that his ammonia level be checked as there was concern for elevated ammonia levels with transaminitis during previous hospitalization. He denies any symptoms concerning for angina. Overall, he and his family are concerned that he is not improving.    Current Outpatient Medications  Medication Sig Dispense Refill   albuterol (VENTOLIN HFA) 108 (90 Base) MCG/ACT inhaler Inhale 2 puffs into the lungs every 6 (six) hours as needed for wheezing or shortness of breath. 8 g 2   aspirin 81 MG EC tablet Take 81 mg by mouth daily.     cetirizine (ZYRTEC ALLERGY) 10 MG tablet Take 1 tablet (10 mg total) by mouth daily. 30 tablet 3   Cholecalciferol (VITAMIN D) 2000 UNITS tablet Take 2,000 Units by mouth daily at 12 noon.      Cyanocobalamin (VITAMIN B-12) 2000 MCG TBCR 1 tablet     finasteride (PROSCAR) 5 MG tablet Take 5 mg by mouth daily at 12 noon.      fluticasone (FLONASE) 50 MCG/ACT nasal spray Place 2 sprays into both nostrils daily. 18.2 mL 2   furosemide (LASIX) 40 MG tablet Take 1 tablet (40 mg total) by mouth daily. 90 tablet 3   glycerin adult 2 g suppository Place 1 suppository rectally as needed for constipation. 12 suppository 0   levothyroxine (SYNTHROID, LEVOTHROID) 75 MCG tablet Take 75 mcg by mouth daily before breakfast.     metFORMIN (GLUCOPHAGE) 500 MG tablet  Take 500 mg by mouth every evening.      metoprolol tartrate (LOPRESSOR) 25 MG tablet Take 0.5 tablets (12.5 mg total) by mouth 2 (two) times daily. 90 tablet 3   polyethylene glycol (GOLYTELY) 236 g solution Drink 240 ml every 15 minutes for bowel movement up until 1 L. If still no bowel movement wait 90 min before repeating. 2000 mL 0   polyethylene glycol powder (GLYCOLAX/MIRALAX) 17 GM/SCOOP powder Take 255 g by mouth daily for 14 doses. 255 g 0   potassium chloride SA (KLOR-CON M) 20 MEQ tablet Take 1 tablet (20 mEq total) by mouth daily. 90 tablet 3   pravastatin (PRAVACHOL) 20 MG tablet Take 20 mg by mouth at bedtime.      psyllium (METAMUCIL) 58.6 % packet Take 1 packet by mouth daily.     ramipril (ALTACE) 5 MG capsule Take 5 mg by mouth daily.      sodium chloride (OCEAN) 0.65 % SOLN nasal spray Place 1 spray into both nostrils daily as needed for congestion.     tamsulosin (FLOMAX) 0.4 MG CAPS capsule Take 0.4 mg by mouth daily at 12 noon.  Thiamine HCl (VITAMIN B1 PO) Take 125 mcg by mouth daily.     venlafaxine XR (EFFEXOR-XR) 75 MG 24 hr capsule Take 1 capsule (75 mg total) by mouth daily with breakfast.     No current facility-administered medications for this visit.     Review of Systems    He denies chest pain, palpitations, dyspnea, pnd, orthopnea, n, v, dizziness, syncope, edema, weight gain. All other systems reviewed and are otherwise negative except as noted above.   Physical Exam    VS:  BP 122/68 (BP Location: Left Arm, Patient Position: Sitting, Cuff Size: Normal)    Pulse 64    Ht 5\' 9"  (1.753 m)    Wt 140 lb (63.5 kg)    BMI 20.67 kg/m   GEN: Well nourished, well developed, in no acute distress. HEENT: normal. Neck: Supple, no JVD, carotid bruits, or masses. Cardiac: RRR, 2/6 murmurs, rubs, or gallops. No clubbing, cyanosis, edema.  Radials/DP/PT 2+ and equal bilaterally.  Respiratory:  Respirations regular and unlabored, crackles to bilateral lung bases,  diminished RLL to auscultation. GI: Soft, nontender, nondistended, BS + x 4. MS: no deformity or atrophy. Skin: warm and dry, no rash. Neuro:  Strength and sensation are intact. Psych: Normal affect.  Accessory Clinical Findings    ECG personally reviewed by me today - No EKG in office today.  Lab Results  Component Value Date   WBC 7.8 06/11/2021   HGB 13.8 06/11/2021   HCT 40.9 06/11/2021   MCV 93.2 06/11/2021   PLT 191 06/11/2021   Lab Results  Component Value Date   CREATININE 1.18 06/11/2021   BUN 21 06/11/2021   NA 129 (L) 06/11/2021   K 3.9 06/11/2021   CL 95 (L) 06/11/2021   CO2 22 06/11/2021   Lab Results  Component Value Date   ALT 53 (H) 06/11/2021   AST 49 (H) 06/11/2021   ALKPHOS 76 06/11/2021   BILITOT 0.9 06/11/2021   No results found for: CHOL, HDL, LDLCALC, LDLDIRECT, TRIG, CHOLHDL  Lab Results  Component Value Date   HGBA1C 6.5 (H) 10/07/2019    Assessment & Plan    1. Dyspnea on exertion/fatigue/recurrent pleural effusions/chronic diastolic heart failure: Prior G2DD on echo. Most recent echo showed EF 55 to 60%, mild concentric LVH, indeterminate diastolic function, elevated left atrial pressure, moderate BAE, moderate MR, with well-positioned mitral clip. BNP was elevated at 2199.4 during recent ED visit. He took lasix for 5 days per PCP with no improvement in symptoms. Weight is stable, no edema. Crackles to bilateral bases, diminished RLL on exam today. CMET on 06/11/21 showed stable kidney function, elevated AST/ALT (improved from hospitalization in 04/2021), CBC was also stable. His family also reports noticeable increase in fatigue, daytime somnolence. They do think this correlates to when he increased his dose of Effexor, and they are weaning this as of today. However, with increased fatigue, daytime somnolence, family is requesting ammonia level in office today.  Will check ammonia, repeat BNP, CMET today. Will start lasix 40 mg daily, supplement  potassium with 20 mEq po daily.  Repeat BMET in 1 week. BP and HR stable. Continue metoprolol. Given history and exam, symptoms are likely multifactorial (cardiac, +/- pulmonary). If no improvement with increased lasix, consider escalation of diuretic, but appreciate pulmonology input. He does have PFTs scheduled for tomorrow and follow-up with pulmonology scheduled for 06/27/2021. Recommend follow-up with PCP for continued weaning of Effexor.  2. Mitral valve prolapse: S/p mitral valve clip in 2021.  Most recent echo as above. Positive murmur on exam, stable.   3. PSVT/NSVT:  Monitor in 04/2021 following syncopal episode showed SVT, 6 beat non-sustained VT. Denies palpitations, dizziness, presyncope, or syncope. Continue metoprolol.   4. Hypertension: BP well controlled. Continue current antihypertensive regimen.   5. Hyperlipidemia: Last documented LDL Was 96 in 2018. No recent lipid profile available for review. Unfortunately, we did not have the opportunity to discuss this further at today's visit. Can discuss at next follow-up visit. Continue ASA, statin.   6. Carotid artery stenosis: Most recent carotid duplex in July 2022 with stable 40 to 59% B ICA stenosis. Asymptomatic.   7. Disposition: F/u as scheduled with Dr. Tamala Julian in March 2023.   [name], NP 06/22/2021, 12:39 PM

## 2021-06-21 NOTE — Patient Instructions (Addendum)
-  Albuterol 2 puffs every 6 hours as needed for shortness of breath -Zyrtec (cetirizine) 10 mg daily -Flonase 2 puffs each nostril daily for nasal congestion/runny nose   Follow up with cardiology tomorrow to discuss heart failure, elevated BNP and recurrent pleural effusions.   Pulmonary function testing scheduled today.  Chest x ray today. We will notify you of any abnormal results.    Follow up as scheduled with Dr. Erin Fulling next week. If symptoms do not improve or worsen, please contact office for sooner follow up or seek emergency care.

## 2021-06-21 NOTE — Telephone Encounter (Signed)
Attempted to contact pt to notify that CXR showed small, b/l pleural effusions. No change from previous CXR. He has some patchy consolidations at the bases of his lungs, which could be pulmonary edema, which were also noted on his previous. He was already treated with abx course for possible pna. Follow up with cardiology tomorrow as scheduled for further management of fluid overload/heart failure.

## 2021-06-21 NOTE — Assessment & Plan Note (Addendum)
Suspect DOE primarily related to HF given significant increase in BNP, pleural effusions and some pulmonary edema.  He is anticipated to see cardiology tomorrow.  Would recommend restarting daily diuretic regimen given hyponatremia appears chronic and no evidence of SIADH.  Will obtain PFTs to further rule out pulmonary etiology given hyperexpansion and some interstitial thickening on imaging.  Trial albuterol as needed for shortness of breath.

## 2021-06-22 ENCOUNTER — Ambulatory Visit: Payer: Medicare HMO | Admitting: Nurse Practitioner

## 2021-06-22 ENCOUNTER — Encounter: Payer: Self-pay | Admitting: Physician Assistant

## 2021-06-22 ENCOUNTER — Telehealth: Payer: Self-pay | Admitting: *Deleted

## 2021-06-22 VITALS — BP 122/68 | HR 64 | Ht 69.0 in | Wt 140.0 lb

## 2021-06-22 DIAGNOSIS — I10 Essential (primary) hypertension: Secondary | ICD-10-CM

## 2021-06-22 DIAGNOSIS — I471 Supraventricular tachycardia: Secondary | ICD-10-CM | POA: Diagnosis not present

## 2021-06-22 DIAGNOSIS — I4729 Other ventricular tachycardia: Secondary | ICD-10-CM

## 2021-06-22 DIAGNOSIS — E785 Hyperlipidemia, unspecified: Secondary | ICD-10-CM

## 2021-06-22 DIAGNOSIS — Z9889 Other specified postprocedural states: Secondary | ICD-10-CM

## 2021-06-22 DIAGNOSIS — I341 Nonrheumatic mitral (valve) prolapse: Secondary | ICD-10-CM | POA: Diagnosis not present

## 2021-06-22 DIAGNOSIS — Z95818 Presence of other cardiac implants and grafts: Secondary | ICD-10-CM | POA: Diagnosis not present

## 2021-06-22 DIAGNOSIS — I5032 Chronic diastolic (congestive) heart failure: Secondary | ICD-10-CM

## 2021-06-22 DIAGNOSIS — J90 Pleural effusion, not elsewhere classified: Secondary | ICD-10-CM | POA: Diagnosis not present

## 2021-06-22 MED ORDER — FUROSEMIDE 40 MG PO TABS: 40.0000 mg | ORAL_TABLET | Freq: Every day | ORAL | 3 refills | Status: DC

## 2021-06-22 MED ORDER — POTASSIUM CHLORIDE CRYS ER 20 MEQ PO TBCR: 20.0000 meq | EXTENDED_RELEASE_TABLET | Freq: Every day | ORAL | 3 refills | Status: DC

## 2021-06-22 NOTE — Patient Instructions (Addendum)
Medication Instructions:  Your physician has recommended you make the following change in your medication:   START Lasix 4o mg taking 1 daily  START Potassium 20 meq taking  1 daily    *If you need a refill on your cardiac medications before your next appointment, please call your pharmacy*   Lab Work:  TODAY:  CMET, BNP, & AMMONIA LEVEL   06/29/21:  TO GO DR. SMITH'S OFFICE FOR A REPEAT  BMET, YOU CAN COME ANYTIME BETWEEN 7:15-5:00  If you have labs (blood work) drawn today and your tests are completely normal, you will receive your results only by: [place] (if you have MyChart) OR A paper copy in the mail If you have any lab test that is abnormal or we need to change your treatment, we will call you to review the results.   Testing/Procedures: None ordered   Follow-Up: At [facility], you and your health needs are our priority.  As part of our continuing mission to provide you with exceptional heart care, we have created designated Provider Care Teams.  These Care Teams include your primary Cardiologist (physician) and Advanced Practice Providers (APPs -  Physician Assistants and Nurse Practitioners) who all work together to provide you with the care you need, when you need it.  We recommend signing up for the patient portal called "MyChart".  Sign up information is provided on this After Visit Summary.  MyChart is used to connect with patients for Virtual Visits (Telemedicine).  Patients are able to view lab/test results, encounter notes, upcoming appointments, etc.  Non-urgent messages can be sent to your provider as well.   To learn more about what you can do with MyChart, go to [email or web address].    Your next appointment:   08/01/21 ARRIVE AT 11:00   The format for your next appointment:   In Person  Provider:   [name], MD     Other Instructions Your physician recommends that you weigh, daily, at the same time every day, and in the same amount  of clothing.  If you notice you gain 3 lbs in 1 day or 5 lbs in 3 days or longer, we want to know about it, could be accumulating fluid.   Follow-up with your Primary Care Physician and Pulmonary Providers

## 2021-06-22 NOTE — Telephone Encounter (Signed)
Daughter was returning call. Please advise  ?

## 2021-06-22 NOTE — Telephone Encounter (Signed)
Returned call to the daughter.   She has been made aware to have the pt drink plenty of water the rest of the day and in the morning, and she will bring him back for lab work tomorrow.

## 2021-06-22 NOTE — Telephone Encounter (Signed)
Call placed to daughter, regarding lab work ordered today. Per [facility] Technician, pt was too dry and couldn't get enough blood.  Left a message for daughter to call back for instructions.

## 2021-06-23 ENCOUNTER — Emergency Department (HOSPITAL_BASED_OUTPATIENT_CLINIC_OR_DEPARTMENT_OTHER): Payer: Medicare HMO

## 2021-06-23 ENCOUNTER — Telehealth: Payer: Self-pay | Admitting: Interventional Cardiology

## 2021-06-23 ENCOUNTER — Other Ambulatory Visit: Payer: Self-pay

## 2021-06-23 ENCOUNTER — Observation Stay (HOSPITAL_COMMUNITY): Payer: Medicare HMO

## 2021-06-23 ENCOUNTER — Telehealth: Payer: Self-pay | Admitting: Pulmonary Disease

## 2021-06-23 ENCOUNTER — Emergency Department (HOSPITAL_BASED_OUTPATIENT_CLINIC_OR_DEPARTMENT_OTHER): Payer: Medicare HMO | Admitting: Radiology

## 2021-06-23 ENCOUNTER — Observation Stay (HOSPITAL_BASED_OUTPATIENT_CLINIC_OR_DEPARTMENT_OTHER)
Admission: EM | Admit: 2021-06-23 | Discharge: 2021-06-25 | Disposition: A | Discharge status: Home Health | Payer: Medicare HMO | Attending: Internal Medicine | Admitting: Internal Medicine

## 2021-06-23 ENCOUNTER — Encounter (HOSPITAL_BASED_OUTPATIENT_CLINIC_OR_DEPARTMENT_OTHER): Payer: Self-pay | Admitting: Emergency Medicine

## 2021-06-23 DIAGNOSIS — J90 Pleural effusion, not elsewhere classified: Secondary | ICD-10-CM | POA: Diagnosis present

## 2021-06-23 DIAGNOSIS — E1122 Type 2 diabetes mellitus with diabetic chronic kidney disease: Secondary | ICD-10-CM | POA: Insufficient documentation

## 2021-06-23 DIAGNOSIS — I10 Essential (primary) hypertension: Secondary | ICD-10-CM | POA: Diagnosis present

## 2021-06-23 DIAGNOSIS — E039 Hypothyroidism, unspecified: Secondary | ICD-10-CM | POA: Diagnosis present

## 2021-06-23 DIAGNOSIS — Z7984 Long term (current) use of oral hypoglycemic drugs: Secondary | ICD-10-CM | POA: Diagnosis not present

## 2021-06-23 DIAGNOSIS — Z7982 Long term (current) use of aspirin: Secondary | ICD-10-CM | POA: Diagnosis not present

## 2021-06-23 DIAGNOSIS — Z20822 Contact with and (suspected) exposure to covid-19: Secondary | ICD-10-CM | POA: Diagnosis not present

## 2021-06-23 DIAGNOSIS — R7989 Other specified abnormal findings of blood chemistry: Secondary | ICD-10-CM | POA: Diagnosis present

## 2021-06-23 DIAGNOSIS — F32A Depression, unspecified: Secondary | ICD-10-CM | POA: Diagnosis not present

## 2021-06-23 DIAGNOSIS — R188 Other ascites: Secondary | ICD-10-CM | POA: Diagnosis not present

## 2021-06-23 DIAGNOSIS — R2689 Other abnormalities of gait and mobility: Secondary | ICD-10-CM | POA: Diagnosis not present

## 2021-06-23 DIAGNOSIS — N1831 Chronic kidney disease, stage 3a: Secondary | ICD-10-CM | POA: Insufficient documentation

## 2021-06-23 DIAGNOSIS — E785 Hyperlipidemia, unspecified: Secondary | ICD-10-CM | POA: Diagnosis not present

## 2021-06-23 DIAGNOSIS — I130 Hypertensive heart and chronic kidney disease with heart failure and stage 1 through stage 4 chronic kidney disease, or unspecified chronic kidney disease: Secondary | ICD-10-CM | POA: Diagnosis not present

## 2021-06-23 DIAGNOSIS — I5033 Acute on chronic diastolic (congestive) heart failure: Secondary | ICD-10-CM | POA: Insufficient documentation

## 2021-06-23 DIAGNOSIS — E1169 Type 2 diabetes mellitus with other specified complication: Secondary | ICD-10-CM | POA: Insufficient documentation

## 2021-06-23 DIAGNOSIS — J449 Chronic obstructive pulmonary disease, unspecified: Secondary | ICD-10-CM | POA: Diagnosis not present

## 2021-06-23 DIAGNOSIS — R945 Abnormal results of liver function studies: Secondary | ICD-10-CM | POA: Diagnosis not present

## 2021-06-23 DIAGNOSIS — Z79899 Other long term (current) drug therapy: Secondary | ICD-10-CM | POA: Insufficient documentation

## 2021-06-23 DIAGNOSIS — N1832 Chronic kidney disease, stage 3b: Secondary | ICD-10-CM | POA: Insufficient documentation

## 2021-06-23 DIAGNOSIS — I509 Heart failure, unspecified: Secondary | ICD-10-CM

## 2021-06-23 DIAGNOSIS — R0602 Shortness of breath: Secondary | ICD-10-CM | POA: Diagnosis not present

## 2021-06-23 DIAGNOSIS — Z09 Encounter for follow-up examination after completed treatment for conditions other than malignant neoplasm: Secondary | ICD-10-CM

## 2021-06-23 DIAGNOSIS — R4182 Altered mental status, unspecified: Secondary | ICD-10-CM | POA: Diagnosis not present

## 2021-06-23 DIAGNOSIS — I110 Hypertensive heart disease with heart failure: Secondary | ICD-10-CM | POA: Diagnosis not present

## 2021-06-23 DIAGNOSIS — R0600 Dyspnea, unspecified: Secondary | ICD-10-CM | POA: Diagnosis not present

## 2021-06-23 DIAGNOSIS — J9811 Atelectasis: Secondary | ICD-10-CM | POA: Diagnosis not present

## 2021-06-23 LAB — COMPREHENSIVE METABOLIC PANEL
ALT: 189 U/L — ABNORMAL HIGH (ref 0–44)
AST: 165 U/L — ABNORMAL HIGH (ref 15–41)
Albumin: 4 g/dL (ref 3.5–5.0)
Alkaline Phosphatase: 150 U/L — ABNORMAL HIGH (ref 38–126)
Anion gap: 11 (ref 5–15)
BUN: 27 mg/dL — ABNORMAL HIGH (ref 8–23)
CO2: 21 mmol/L — ABNORMAL LOW (ref 22–32)
Calcium: 9.3 mg/dL (ref 8.9–10.3)
Chloride: 93 mmol/L — ABNORMAL LOW (ref 98–111)
Creatinine, Ser: 1.33 mg/dL — ABNORMAL HIGH (ref 0.61–1.24)
GFR, Estimated: 51 mL/min — ABNORMAL LOW (ref >60)
Glucose, Bld: 177 mg/dL — ABNORMAL HIGH (ref 70–99)
Potassium: 4.7 mmol/L (ref 3.5–5.1)
Sodium: 125 mmol/L — ABNORMAL LOW (ref 135–145)
Total Bilirubin: 1 mg/dL (ref 0.3–1.2)
Total Protein: 6.9 g/dL (ref 6.5–8.1)

## 2021-06-23 LAB — CBC WITH DIFFERENTIAL/PLATELET
Abs Immature Granulocytes: 0.02 10*3/uL (ref 0.00–0.07)
Basophils Absolute: 0 10*3/uL (ref 0.0–0.1)
Basophils Relative: 0 %
Eosinophils Absolute: 0 10*3/uL (ref 0.0–0.5)
Eosinophils Relative: 0 %
HCT: 39.5 % (ref 39.0–52.0)
Hemoglobin: 13.2 g/dL (ref 13.0–17.0)
Immature Granulocytes: 0 %
Lymphocytes Relative: 24 %
Lymphs Abs: 2 10*3/uL (ref 0.7–4.0)
MCH: 30.6 pg (ref 26.0–34.0)
MCHC: 33.4 g/dL (ref 30.0–36.0)
MCV: 91.6 fL (ref 80.0–100.0)
Monocytes Absolute: 0.7 10*3/uL (ref 0.1–1.0)
Monocytes Relative: 9 %
Neutro Abs: 5.5 10*3/uL (ref 1.7–7.7)
Neutrophils Relative %: 67 %
Platelets: 98 10*3/uL — ABNORMAL LOW (ref 150–400)
RBC: 4.31 MIL/uL (ref 4.22–5.81)
RDW: 14.6 % (ref 11.5–15.5)
WBC: 8.3 10*3/uL (ref 4.0–10.5)
nRBC: 0 % (ref 0.0–0.2)

## 2021-06-23 LAB — URINALYSIS, ROUTINE W REFLEX MICROSCOPIC
Bilirubin Urine: NEGATIVE
Glucose, UA: NEGATIVE mg/dL
Hgb urine dipstick: NEGATIVE
Ketones, ur: NEGATIVE mg/dL
Leukocytes,Ua: NEGATIVE
Nitrite: NEGATIVE
Protein, ur: 30 mg/dL — AB
Specific Gravity, Urine: 1.02 (ref 1.005–1.030)
pH: 5.5 (ref 5.0–8.0)

## 2021-06-23 LAB — TROPONIN I (HIGH SENSITIVITY)
Troponin I (High Sensitivity): 27 ng/L — ABNORMAL HIGH (ref <18)
Troponin I (High Sensitivity): 28 ng/L — ABNORMAL HIGH (ref <18)

## 2021-06-23 LAB — CBG MONITORING, ED: Glucose-Capillary: 164 mg/dL — ABNORMAL HIGH (ref 70–99)

## 2021-06-23 LAB — RESP PANEL BY RT-PCR (FLU A&B, COVID) ARPGX2
Influenza A by PCR: NEGATIVE
Influenza B by PCR: NEGATIVE
SARS Coronavirus 2 by RT PCR: NEGATIVE

## 2021-06-23 LAB — GLUCOSE, CAPILLARY: Glucose-Capillary: 156 mg/dL — ABNORMAL HIGH (ref 70–99)

## 2021-06-23 LAB — BRAIN NATRIURETIC PEPTIDE: B Natriuretic Peptide: 1489.3 pg/mL — ABNORMAL HIGH (ref 0.0–100.0)

## 2021-06-23 MED ORDER — ONDANSETRON HCL 4 MG/2ML IJ SOLN: 4.0000 mg | Freq: Four times a day (QID) | INTRAMUSCULAR | Status: DC | PRN

## 2021-06-23 MED ORDER — METOPROLOL TARTRATE 12.5 MG HALF TABLET
12.5000 mg | ORAL_TABLET | Freq: Two times a day (BID) | ORAL | Status: DC
  Administered 2021-06-24 – 2021-06-25 (×3): 12.5 mg via ORAL
  Filled 2021-06-23 (×4): qty 1

## 2021-06-23 MED ORDER — VITAMIN D 25 MCG (1000 UNIT) PO TABS
2000.0000 [IU] | ORAL_TABLET | Freq: Every day | ORAL | Status: DC
  Administered 2021-06-24 – 2021-06-25 (×2): 2000 [IU] via ORAL
  Filled 2021-06-23 (×2): qty 2

## 2021-06-23 MED ORDER — VENLAFAXINE HCL ER 75 MG PO CP24
75.0000 mg | ORAL_CAPSULE | Freq: Every day | ORAL | Status: DC
  Administered 2021-06-24: 75 mg via ORAL
  Filled 2021-06-23: qty 1

## 2021-06-23 MED ORDER — FINASTERIDE 5 MG PO TABS
5.0000 mg | ORAL_TABLET | Freq: Every day | ORAL | Status: DC
  Administered 2021-06-24 – 2021-06-25 (×2): 5 mg via ORAL
  Filled 2021-06-23 (×2): qty 1

## 2021-06-23 MED ORDER — ENOXAPARIN SODIUM 40 MG/0.4ML IJ SOSY
40.0000 mg | PREFILLED_SYRINGE | INTRAMUSCULAR | Status: DC
  Administered 2021-06-23 – 2021-06-24 (×2): 40 mg via SUBCUTANEOUS
  Filled 2021-06-23 (×2): qty 0.4

## 2021-06-23 MED ORDER — ASPIRIN EC 81 MG PO TBEC
81.0000 mg | DELAYED_RELEASE_TABLET | Freq: Every day | ORAL | Status: DC
  Administered 2021-06-24 – 2021-06-25 (×2): 81 mg via ORAL
  Filled 2021-06-23 (×2): qty 1

## 2021-06-23 MED ORDER — SODIUM CHLORIDE 0.9% FLUSH
3.0000 mL | Freq: Two times a day (BID) | INTRAVENOUS | Status: DC
  Administered 2021-06-23 – 2021-06-25 (×4): 3 mL via INTRAVENOUS

## 2021-06-23 MED ORDER — TAMSULOSIN HCL 0.4 MG PO CAPS
0.4000 mg | ORAL_CAPSULE | Freq: Every day | ORAL | Status: DC
  Administered 2021-06-24 – 2021-06-25 (×2): 0.4 mg via ORAL
  Filled 2021-06-23 (×2): qty 1

## 2021-06-23 MED ORDER — FUROSEMIDE 10 MG/ML IJ SOLN
40.0000 mg | Freq: Every day | INTRAMUSCULAR | Status: DC
  Administered 2021-06-24 – 2021-06-25 (×2): 40 mg via INTRAVENOUS
  Filled 2021-06-23 (×2): qty 4

## 2021-06-23 MED ORDER — LEVOTHYROXINE SODIUM 75 MCG PO TABS
75.0000 ug | ORAL_TABLET | Freq: Every day | ORAL | Status: DC
  Administered 2021-06-24 – 2021-06-25 (×2): 75 ug via ORAL
  Filled 2021-06-23 (×2): qty 1

## 2021-06-23 MED ORDER — SODIUM CHLORIDE 0.9 % IV SOLN: 250.0000 mL | INTRAVENOUS | Status: DC | PRN

## 2021-06-23 MED ORDER — RAMIPRIL 5 MG PO CAPS
5.0000 mg | ORAL_CAPSULE | Freq: Every day | ORAL | Status: DC
  Administered 2021-06-24 – 2021-06-25 (×2): 5 mg via ORAL
  Filled 2021-06-23 (×2): qty 1

## 2021-06-23 MED ORDER — ACETAMINOPHEN 325 MG PO TABS: 650.0000 mg | ORAL_TABLET | ORAL | Status: DC | PRN

## 2021-06-23 MED ORDER — METFORMIN HCL 500 MG PO TABS: 500.0000 mg | ORAL_TABLET | Freq: Every evening | ORAL | Status: DC

## 2021-06-23 MED ORDER — FUROSEMIDE 10 MG/ML IJ SOLN
40.0000 mg | Freq: Once | INTRAMUSCULAR | Status: AC
  Administered 2021-06-23: 40 mg via INTRAVENOUS
  Filled 2021-06-23: qty 4

## 2021-06-23 MED ORDER — INSULIN ASPART 100 UNIT/ML IJ SOLN
0.0000 [IU] | Freq: Three times a day (TID) | INTRAMUSCULAR | Status: DC
  Administered 2021-06-24: 2 [IU] via SUBCUTANEOUS
  Administered 2021-06-24: 1 [IU] via SUBCUTANEOUS
  Administered 2021-06-24 – 2021-06-25 (×3): 2 [IU] via SUBCUTANEOUS

## 2021-06-23 MED ORDER — SODIUM CHLORIDE 0.9% FLUSH: 3.0000 mL | INTRAVENOUS | Status: DC | PRN

## 2021-06-23 NOTE — Telephone Encounter (Signed)
Patient's daughter states the patient is at the ED at [facility]. She says she is not sure if he will be transferred or not, but wanted to let the office know. She says they brought the paperwork for labs there and they have drawn blood today in the ED. She says she does not need a call back.

## 2021-06-23 NOTE — H&P (Signed)
History and Physical    Adrian Olson:[id number] DOB: 1931/02/03 DOA: 06/23/2021  PCP: Merrilee Seashore, MD (Confirm with patient/family/NH records and if not entered, this has to be entered at [facility] point of entry) Patient coming from: Home  I have personally briefly reviewed patient's old medical records in [place]  Chief Complaint: SOB  HPI: Adrian Olson is a 88 y.o. male with medical history significant of severe MR status post clipping 2021, HFpEF,recurrent pleural effusion, HTN, HLD, IIDM, chronic hyponatremia, hypothyroidism, BPH, presented with increasing shortness of breath and fatigue.  Patient sustained a mechanical fall and broke his right-sided ribs around Thanksgiving.  Incidentally, it was found that he has a large right-sided pleural effusion, for which he underwent thoracentesis.  However the right-sided pleural effusion grew back in December, patient underwent a total of 6 days pigtail chest tubes.  Patient reported that since Thanksgiving, his strength and breathing problem have gradually become worse.  He used to be able to walk 2 miles every day before Thanksgiving but now even walks around his home causes significant shortness of breath, this week even dressing himself cause significant shortness of breath and he feels extremely tired as well.  He denies any chest pain, no cough no fever or chills.  His other complaint is he has had cycles of constipation and diarrhea for last weeks, he feels " there is a bump on the right side of anus", but no pain no blood in the stool.  Patient came to pulmonary on January 30, for recurrent right-sided pleural effusion, was prescribed with 5 days of 20 mg Lasix.  Patient reported despite taking Lasix 20 mg for last week, no significant improvement of breathing/urine output, he said that is a drastic contrast to what happened after today's 40 mg Lasix given at [facility] ED  ED Course: Borderline bradycardia, no  hypotension.  No hypoxia.  Chest x-ray showed parietal pleural effusion, CT chest showed lung congestion in the bilateral small to moderate pleural effusion.  Patient was given 40 mg IV Lasix and breathing symptoms significantly improved.  Lab value creatinine 1.3 compared to 1.0 baseline, AST 165 ALT 189, bilirubin 1.0  Review of Systems: As per HPI otherwise 14 point review of systems negative.    Past Medical History:  Diagnosis Date   Bilateral carotid artery disease (HCC)    Severe Left and moderate Right   Diabetes mellitus without complication [facility])    ED (erectile dysfunction)    Hyperlipidemia    Hypertension    Lumbar disc disease    Mitral valve prolapse    moderate to severe MR, LVEF 55% Echo 2010   Moderate mitral regurgitation 03/16/2014   S/P mitral valve clip implantation 10/09/2019   s/p TEER using 2 MitraClip XTW devices, first device positioned A2 P2, second device positioned A1 P1 by Dr. Burt Knack    Past Surgical History:  Procedure Laterality Date   CARDIAC CATHETERIZATION     MITRAL VALVE REPAIR  10/09/2019   MITRAL VALVE REPAIR (N/A )   MITRAL VALVE REPAIR N/A 10/09/2019   Procedure: MITRAL VALVE REPAIR;  Surgeon: Sherren Mocha, MD;  Location: [place] [facility];  Service: Cardiovascular;  Laterality: N/A;   RIGHT/LEFT HEART CATH AND CORONARY ANGIOGRAPHY N/A 09/16/2019   Procedure: RIGHT/LEFT HEART CATH AND CORONARY ANGIOGRAPHY;  Surgeon: Belva Crome, MD;  Location: [place] [facility];  Service: Cardiovascular;  Laterality: N/A;   TEE WITHOUT CARDIOVERSION N/A 09/16/2019   Procedure: TRANSESOPHAGEAL ECHOCARDIOGRAM (  TEE);  Surgeon: Lelon Perla, MD;  Location: [facility] ENDOSCOPY;  Service: Cardiovascular;  Laterality: N/A;     reports that he has never smoked. He has never used smokeless tobacco. He reports that he does not drink alcohol and does not use drugs.  Allergies  Allergen Reactions   Duloxetine Hcl Other (See Comments)    withdrawal    Family  History  Problem Relation Age of Onset   Cancer Mother        ovarian   Diabetes Father    Other Father        enlarged prostate   Stroke Father    Heart disease Father    Heart disease Sister    Obesity Sister    Dementia Sister    Diabetes Sister    Other Sister        nornmal old age problems     Prior to Admission medications   Medication Sig Start Date End Date Taking? Authorizing Provider  aspirin 81 MG EC tablet Take 81 mg by mouth daily.   Yes [provider]  Cholecalciferol (VITAMIN D) 2000 UNITS tablet Take 2,000 Units by mouth daily at 12 noon.    Yes [provider]  Cyanocobalamin (VITAMIN B-12) 2000 MCG TBCR 1 tablet   Yes [provider]  finasteride (PROSCAR) 5 MG tablet Take 5 mg by mouth daily at 12 noon.    Yes [provider]  levothyroxine (SYNTHROID, LEVOTHROID) 75 MCG tablet Take 75 mcg by mouth daily before breakfast.   Yes [provider]  metFORMIN (GLUCOPHAGE) 500 MG tablet Take 500 mg by mouth every evening.  04/23/16  Yes [provider]  metoprolol tartrate (LOPRESSOR) 25 MG tablet Take 0.5 tablets (12.5 mg total) by mouth 2 (two) times daily. 05/25/21 06/24/21 Yes Cleaver, Jossie Ng, NP  pravastatin (PRAVACHOL) 20 MG tablet Take 20 mg by mouth at bedtime.    Yes [provider]  ramipril (ALTACE) 5 MG capsule Take 5 mg by mouth daily.    Yes [provider]  tamsulosin (FLOMAX) 0.4 MG CAPS capsule Take 0.4 mg by mouth daily at 12 noon.  04/13/18  Yes [provider]  Thiamine HCl (VITAMIN B1 PO) Take 125 mcg by mouth daily.   Yes [provider]  venlafaxine XR (EFFEXOR-XR) 75 MG 24 hr capsule Take 1 capsule (75 mg total) by mouth daily with breakfast. 05/10/21  Yes Byrum, Rose Fillers, MD  albuterol (VENTOLIN HFA) 108 (90 Base) MCG/ACT inhaler Inhale 2 puffs into the lungs every 6 (six) hours as needed for wheezing or shortness of breath. 06/21/21   Cobb, Karie Schwalbe, NP   cetirizine (ZYRTEC ALLERGY) 10 MG tablet Take 1 tablet (10 mg total) by mouth daily. 06/21/21   Cobb, Karie Schwalbe, NP  fluticasone (FLONASE) 50 MCG/ACT nasal spray Place 2 sprays into both nostrils daily. 06/21/21   Cobb, Karie Schwalbe, NP  furosemide (LASIX) 40 MG tablet Take 1 tablet (40 mg total) by mouth daily. 06/22/21 09/20/21  [name], NP  glycerin adult 2 g suppository Place 1 suppository rectally as needed for constipation. Patient not taking: Reported on 06/23/2021 1/60/73   Campbell Stall P, DO  polyethylene glycol (GOLYTELY) 236 g solution Drink 240 ml every 15 minutes for bowel movement up until 1 L. If still no bowel movement wait 90 min before repeating. Patient not taking: Reported on 06/23/2021 11/21/60   Campbell Stall P, DO  polyethylene glycol powder (GLYCOLAX/MIRALAX) 17 GM/SCOOP  powder Take 255 g by mouth daily for 14 doses. Patient not taking: Reported on 06/23/2021 06/11/21 3/71/06  Campbell Stall P, DO  potassium chloride SA (KLOR-CON M) 20 MEQ tablet Take 1 tablet (20 mEq total) by mouth daily. 06/22/21   [name], NP  sodium chloride (OCEAN) 0.65 % SOLN nasal spray Place 1 spray into both nostrils daily as needed for congestion.    [provider]    Physical Exam: Vitals:   06/23/21 1630 06/23/21 1700 06/23/21 1730 06/23/21 1839  BP: (!) 127/98 119/72 113/73 127/63  Pulse: (!) 57  (!) 57 67  Resp: (!) 23 (!) 23 (!) 21 17  Temp:    97.8 F (36.6 C)  TempSrc:    Oral  SpO2: 93%  98% 92%  Weight:    69.1 kg  Height:    5\' 9"  (1.753 m)    Constitutional: NAD, calm, comfortable Vitals:   06/23/21 1630 06/23/21 1700 06/23/21 1730 06/23/21 1839  BP: (!) 127/98 119/72 113/73 127/63  Pulse: (!) 57  (!) 57 67  Resp: (!) 23 (!) 23 (!) 21 17  Temp:    97.8 F (36.6 C)  TempSrc:    Oral  SpO2: 93%  98% 92%  Weight:    69.1 kg  Height:    5\' 9"  (1.753 m)   Eyes: PERRL, lids and conjunctivae normal ENMT: Mucous membranes are moist. Posterior pharynx clear of any  exudate or lesions.Normal dentition.  Neck: normal, supple, no masses, no thyromegaly Respiratory: clear to auscultation bilaterally, no wheezing, fine crackles on bilateral lower bases. Normal respiratory effort. No accessory muscle use.  Cardiovascular: Regular rate and rhythm, loud systolic murmur on heart apex. No extremity edema. 2+ pedal pulses. No carotid bruits.  Abdomen: no tenderness, no masses palpated. No hepatosplenomegaly. Bowel sounds positive.  Musculoskeletal: no clubbing / cyanosis. No joint deformity upper and lower extremities. Good ROM, no contractures. Normal muscle tone.  Skin: no rashes, lesions, ulcers. No induration Neurologic: CN 2-12 grossly intact. Sensation intact, DTR normal. Strength 5/5 in all 4.  Psychiatric: Normal judgment and insight. Alert and oriented x 3. Normal mood.    Labs on Admission: I have personally reviewed following labs and imaging studies  CBC: Recent Labs  Lab 06/23/21 1048  WBC 8.3  NEUTROABS 5.5  HGB 13.2  HCT 39.5  MCV 91.6  PLT 98*   Basic Metabolic Panel: Recent Labs  Lab 06/23/21 1048  NA 125*  K 4.7  CL 93*  CO2 21*  GLUCOSE 177*  BUN 27*  CREATININE 1.33*  CALCIUM 9.3   GFR: Estimated Creatinine Clearance: 37.5 mL/min (A) (by C-G formula based on SCr of 1.33 mg/dL (H)). Liver Function Tests: Recent Labs  Lab 06/23/21 1048  AST 165*  ALT 189*  ALKPHOS 150*  BILITOT 1.0  PROT 6.9  ALBUMIN 4.0   No results for input(s): LIPASE, AMYLASE in the last 168 hours. No results for input(s): AMMONIA in the last 168 hours. Coagulation Profile: No results for input(s): INR, PROTIME in the last 168 hours. Cardiac Enzymes: No results for input(s): CKTOTAL, CKMB, CKMBINDEX, TROPONINI in the last 168 hours. BNP (last 3 results) No results for input(s): PROBNP in the last 8760 hours. HbA1C: No results for input(s): HGBA1C in the last 72 hours. CBG: Recent Labs  Lab 06/23/21 1724  GLUCAP 164*   Lipid  Profile: No results for input(s): CHOL, HDL, LDLCALC, TRIG, CHOLHDL, LDLDIRECT in the last 72 hours. Thyroid Function Tests:  No results for input(s): TSH, T4TOTAL, FREET4, T3FREE, THYROIDAB in the last 72 hours. Anemia Panel: No results for input(s): VITAMINB12, FOLATE, FERRITIN, TIBC, IRON, RETICCTPCT in the last 72 hours. Urine analysis:    Component Value Date/Time   COLORURINE YELLOW 06/23/2021 1320   APPEARANCEUR CLEAR 06/23/2021 1320   LABSPEC 1.020 06/23/2021 1320   PHURINE 5.5 06/23/2021 1320   GLUCOSEU NEGATIVE 06/23/2021 1320   HGBUR NEGATIVE 06/23/2021 1320   BILIRUBINUR NEGATIVE 06/23/2021 1320   KETONESUR NEGATIVE 06/23/2021 1320   PROTEINUR 30 (A) 06/23/2021 1320   NITRITE NEGATIVE 06/23/2021 1320   LEUKOCYTESUR NEGATIVE 06/23/2021 1320    Radiological Exams on Admission: DG Chest 2 View  Result Date: 06/23/2021 CLINICAL DATA:  Dyspnea EXAM: CHEST - 2 VIEW COMPARISON:  06/21/2021 FINDINGS: Patchy bibasilar airspace disease similar to the prior study. Small effusions. COPD with hyperinflation.  Negative for heart failure or edema Chronic right rib fractures.  Chronic right AC separation. IMPRESSION: Bibasilar airspace disease and small effusions unchanged. Possible pneumonia. Electronically Signed   By: Franchot Gallo M.D.   On: 06/23/2021 11:22   CT Head Wo Contrast  Result Date: 06/23/2021 CLINICAL DATA:  Mental status change, unknown cause EXAM: CT HEAD WITHOUT CONTRAST TECHNIQUE: Contiguous axial images were obtained from the base of the skull through the vertex without intravenous contrast. RADIATION DOSE REDUCTION: This exam was performed according to the departmental dose-optimization program which includes automated exposure control, adjustment of the mA and/or kV according to patient size and/or use of iterative reconstruction technique. COMPARISON:  04/24/2021 FINDINGS: Brain: There is no acute intracranial hemorrhage, mass effect, or edema. Gray-white  differentiation is preserved. There is no extra-axial fluid collection. Prominence of the ventricles and sulci reflects similar parenchymal volume loss. Minimal patchy low-density in the supratentorial white matter is nonspecific but could reflect minor chronic microvascular ischemic changes. Vascular: There is atherosclerotic calcification at the skull base. Skull: Calvarium is unremarkable. Sinuses/Orbits: No acute finding. Other: None. IMPRESSION: No acute intracranial abnormality. Electronically Signed   By: Macy Mis M.D.   On: 06/23/2021 12:13   CT Chest Wo Contrast  Result Date: 06/23/2021 CLINICAL DATA:  Pneumonia, complication suspected, xray done EXAM: CT CHEST WITHOUT CONTRAST TECHNIQUE: Multidetector CT imaging of the chest was performed following the standard protocol without IV contrast. RADIATION DOSE REDUCTION: This exam was performed according to the departmental dose-optimization program which includes automated exposure control, adjustment of the mA and/or kV according to patient size and/or use of iterative reconstruction technique. COMPARISON:  X-ray 06/23/2021.  CT 05/06/2021 FINDINGS: Cardiovascular: Stable cardiomegaly. No pericardial effusion. Postsurgical changes to the mitral valve. Mid ascending thoracic aorta measures up to 3.9 cm in diameter. Atherosclerotic calcifications of the aorta and coronary arteries. Central pulmonary arteries are mildly dilated suggesting pulmonary arterial hypertension. Mediastinum/Nodes: No axillary or mediastinal lymphadenopathy. Evaluation of the hilar structures is limited in the absence of intravenous contrast. Within this limitation, no obvious hilar adenopathy or mass is identified. Trachea, esophagus, and thyroid gland appear within normal limits. Lungs/Pleura: Small to moderate sized bilateral pleural effusions with associated compressive atelectasis within the lung bases. Elsewhere, the lungs are clear. No pneumothorax. Upper Abdomen: No  acute abnormality. Musculoskeletal: Redemonstration of multiple subacute posterior right-sided rib fractures. No new or acute bony findings. Mild bilateral gynecomastia. Otherwise, no chest wall abnormality. IMPRESSION: 1. Small-to-moderate sized bilateral pleural effusions with associated bibasilar compressive atelectasis. 2. Redemonstration of multiple subacute posterior right-sided rib fractures. No residual pneumothorax. 3. Stable cardiomegaly. 4. Mildly dilated central  pulmonary arteries suggesting pulmonary arterial hypertension. 5. Aortic and coronary artery atherosclerosis (ICD10-I70.0). Electronically Signed   By: Davina Poke D.O.   On: 06/23/2021 12:59    EKG: Independently reviewed.  Sinus bradycardia, no acute ST changes  Assessment/Plan Principal Problem:   CHF (congestive heart failure) ([place])  (please populate well all problems here in Problem List. (For example, if patient is on BP meds at home and you resume or decide to hold them, it is a problem that needs to be her. Same for CAD, COPD, HLD and so on)  Acute on chronic moderate to severe MR documentation/acute on chronic CHF with preserved LVEF. -Well responded to today's 40 mg IV Lasix given at [facility] ED. Still having nights of fluid overload, plan to continue IV Lasix 40 mg daily, reevaluate volume status tomorrow, if his symptoms continue to improve and lab value including kidney function and liver function stable, expect patient discharged home with as needed Lasix 40 mg daily for breathing symptoms and weight gain> 2 pounds.  Patient to follow-up with cardiology as outpatient.  Explained to patient who expressed understanding and agreed. -Echocardiogram was done 2 months ago, will not repeat at this time.  Transaminitis -Acute on chronic, suspect this is related to CHF decompensation.  No RUQ symptoms, will check RUQ ultrasound and acute hepatitis panel, -hold statin for now. -Check CK  AKI -Hypervolemic, suspect  cardiorenal syndrome from CHF decompensation.  IV Lasix as above recheck kidney function tomorrow.  Hyponatremia -Baseline 125-129 -Acute on chronic, with hypervolemic, diuresis and reevaluate.  Thrombocytopenia -No significant overt bleeding, H&H stable -Recheck CBC tomorrow.  Constipation/diarrhea -Recommend outpatient follow-up with GI.  IIDM -Hold metformin, start sliding scale.  BPH -Continue finasteride and Flomax  Deconditioning -Treat CHF, PT evaluation tomorrow.  DVT prophylaxis: Lovenox Code Status: Full code Family Communication: None at bedside Disposition Plan: Expect less than 2 midnight hospital stay Consults called: None Admission status: Tele obs   Lequita Halt MD [facility] Pager [phone]  06/23/2021, 7:26 PM

## 2021-06-23 NOTE — Telephone Encounter (Signed)
Spoke with patient's daughter. He is being transferred to [facility] for further evaluation. I instructed Melissa to call our office to schedule a hospital follow up when they know he will be discharged.  Freda Jackson, MD [facility] Office: [phone]   See Amion for personal pager PCCM on call pager [phone] until 7pm. Please call Elink 7p-7a. [phone]

## 2021-06-23 NOTE — ED Notes (Signed)
Pt incontinent of stool and urine. Linen change and pericare performed

## 2021-06-23 NOTE — ED Notes (Signed)
Attempted report to [facility]

## 2021-06-23 NOTE — ED Triage Notes (Signed)
Pt arrives to ED with c/o shortness of breath. This is chronic for him but has worsened over the past two weeks. He reports increased fatigue, falls, lethargy, dehydration, cough, confusion, weakness. Was seen by Cardiology yesterday for same, family concerned that his condition is worsening. Pt reports that he started lasix 40mg  yesterday for chronic diastolic HF.

## 2021-06-23 NOTE — ED Provider Notes (Signed)
[place] EMERGENCY DEPT Provider Note   CSN: [id number] Arrival date & time: 06/23/21  1018     History  Chief Complaint  Patient presents with   Shortness of Breath    Adrian Olson is a 88 y.o. male.  HPI 88 year old male presents with a primary concern for weakness.  History is primarily from the wife and somewhat from the patient and later the daughter who arrived.  Wife notes that he has been progressively downhill since last fall when he broke his ribs.  He had to have thoracentesis and chest tube.  However over the last couple weeks this weakness and general unwellness has worsened.  He is more short of breath.  At a cardiology visit yesterday they noticed some ankle edema.  Patient was on Lasix at the end of last month but then taken off of it.  He also had some antibiotics.  However they represcribe some Lasix yesterday but because of him getting worse they brought him into the ER.  He has been weak enough that he cannot get up on his own which is new and worse.  He is having to use a urinal at the bedside.  He has had a slight cough recently.  No fevers, headaches, chest pain or abdominal pain.  However he is getting weaker and family feels like he is getting more confused intermittently.  They thought this might be due to an increased dose of his antidepressant and so they have been trying to cut that back and his PCP is arranging for it to be tapered off.  Home Medications Prior to Admission medications   Medication Sig Start Date End Date Taking? Authorizing Provider  aspirin 81 MG EC tablet Take 81 mg by mouth daily.   Yes [provider]  Cholecalciferol (VITAMIN D) 2000 UNITS tablet Take 2,000 Units by mouth daily at 12 noon.    Yes [provider]  Cyanocobalamin (VITAMIN B-12) 2000 MCG TBCR 1 tablet   Yes [provider]  finasteride (PROSCAR) 5 MG tablet Take 5 mg by mouth daily at 12 noon.    Yes [provider]   levothyroxine (SYNTHROID, LEVOTHROID) 75 MCG tablet Take 75 mcg by mouth daily before breakfast.   Yes [provider]  metFORMIN (GLUCOPHAGE) 500 MG tablet Take 500 mg by mouth every evening.  04/23/16  Yes [provider]  metoprolol tartrate (LOPRESSOR) 25 MG tablet Take 0.5 tablets (12.5 mg total) by mouth 2 (two) times daily. 05/25/21 06/24/21 Yes Cleaver, Jossie Ng, NP  pravastatin (PRAVACHOL) 20 MG tablet Take 20 mg by mouth at bedtime.    Yes [provider]  ramipril (ALTACE) 5 MG capsule Take 5 mg by mouth daily.    Yes [provider]  tamsulosin (FLOMAX) 0.4 MG CAPS capsule Take 0.4 mg by mouth daily at 12 noon.  04/13/18  Yes [provider]  Thiamine HCl (VITAMIN B1 PO) Take 125 mcg by mouth daily.   Yes [provider]  venlafaxine XR (EFFEXOR-XR) 75 MG 24 hr capsule Take 1 capsule (75 mg total) by mouth daily with breakfast. 05/10/21  Yes Byrum, Rose Fillers, MD  albuterol (VENTOLIN HFA) 108 (90 Base) MCG/ACT inhaler Inhale 2 puffs into the lungs every 6 (six) hours as needed for wheezing or shortness of breath. 06/21/21   Cobb, Karie Schwalbe, NP  cetirizine (ZYRTEC ALLERGY) 10 MG tablet Take 1 tablet (10 mg total) by mouth daily. 06/21/21   Clayton Bibles, NP  fluticasone (FLONASE) 50 MCG/ACT nasal spray Place 2 sprays into both nostrils daily. 06/21/21   Cobb, Karie Schwalbe, NP  furosemide (LASIX) 40 MG tablet Take 1 tablet (40 mg total) by mouth daily. 06/22/21 09/20/21  [name], NP  glycerin adult 2 g suppository Place 1 suppository rectally as needed for constipation. Patient not taking: Reported on 06/23/2021 6/38/46   Campbell Stall P, DO  polyethylene glycol (GOLYTELY) 236 g solution Drink 240 ml every 15 minutes for bowel movement up until 1 L. If still no bowel movement wait 90 min before repeating. Patient not taking: Reported on 06/23/2021 6/59/93   Campbell Stall P, DO  polyethylene glycol powder (GLYCOLAX/MIRALAX) 17 GM/SCOOP powder  Take 255 g by mouth daily for 14 doses. Patient not taking: Reported on 06/23/2021 06/11/21 5/70/17  Campbell Stall P, DO  potassium chloride SA (KLOR-CON M) 20 MEQ tablet Take 1 tablet (20 mEq total) by mouth daily. 06/22/21   [name], NP  sodium chloride (OCEAN) 0.65 % SOLN nasal spray Place 1 spray into both nostrils daily as needed for congestion.    [provider]      Allergies    Duloxetine hcl    Review of Systems   Review of Systems  Constitutional:  Positive for fatigue. Negative for fever.  Respiratory:  Positive for cough and shortness of breath.   Cardiovascular:  Positive for leg swelling. Negative for chest pain.  Gastrointestinal:  Negative for abdominal pain.  Neurological:  Positive for weakness. Negative for headaches.  Psychiatric/Behavioral:  Positive for confusion.    Physical Exam Updated Vital Signs BP 136/68    Pulse (!) 56    Temp (!) 97.1 F (36.2 C) (Oral)    Resp (!) 26    Ht 5\' 9"  (1.753 m)    Wt 63.5 kg    SpO2 92%    BMI 20.67 kg/m  Physical Exam Vitals and nursing note reviewed.  Constitutional:      General: He is not in acute distress.    Appearance: He is well-developed. He is not ill-appearing or diaphoretic.  HENT:     Head: Normocephalic and atraumatic.  Cardiovascular:     Rate and Rhythm: Normal rate and regular rhythm.     Heart sounds: Normal heart sounds.  Pulmonary:     Effort: Pulmonary effort is normal. Tachypnea present. No accessory muscle usage.     Breath sounds: Examination of the right-lower field reveals rales. Examination of the left-lower field reveals rales. Rales (mild, bases) present.  Abdominal:     Palpations: Abdomen is soft.     Tenderness: There is no abdominal tenderness.  Musculoskeletal:     Right lower leg: Edema present.     Left lower leg: Edema present.     Comments: Mild edema of feet/ankles bilaterally  Skin:    General: Skin is warm and dry.  Neurological:     Mental Status: He is alert.      Comments: 5/5 strength in all 4 extremities    ED Results / Procedures / Treatments   Labs (all labs ordered are listed, but only abnormal results are displayed) Labs Reviewed  URINALYSIS, ROUTINE W REFLEX MICROSCOPIC - Abnormal; Notable for the following components:      Result Value   Protein, ur 30 (*)    All other components within normal limits  COMPREHENSIVE METABOLIC PANEL - Abnormal; Notable for the following components:   Sodium 125 (*)    Chloride 93 (*)  CO2 21 (*)    Glucose, Bld 177 (*)    BUN 27 (*)    Creatinine, Ser 1.33 (*)    AST 165 (*)    ALT 189 (*)    Alkaline Phosphatase 150 (*)    GFR, Estimated 51 (*)    All other components within normal limits  CBC WITH DIFFERENTIAL/PLATELET - Abnormal; Notable for the following components:   Platelets 98 (*)    All other components within normal limits  BRAIN NATRIURETIC PEPTIDE - Abnormal; Notable for the following components:   B Natriuretic Peptide 1,489.3 (*)    All other components within normal limits  TROPONIN I (HIGH SENSITIVITY) - Abnormal; Notable for the following components:   Troponin I (High Sensitivity) 27 (*)    All other components within normal limits  TROPONIN I (HIGH SENSITIVITY) - Abnormal; Notable for the following components:   Troponin I (High Sensitivity) 28 (*)    All other components within normal limits  RESP PANEL BY RT-PCR (FLU A&B, COVID) [id number]  CBG MONITORING, ED    EKG EKG Interpretation  Date/Time:  Thursday June 23 2021 10:36:50 EST Ventricular Rate:  61 PR Interval:  212 QRS Duration: 113 QT Interval:  456 QTC Calculation: 460 R Axis:   176 Text Interpretation: Sinus rhythm Borderline prolonged PR interval Probable anterolateral infarct, old Confirmed by Sherwood Gambler [phone]) on 06/23/2021 10:53:12 AM  Radiology DG Chest 2 View  Result Date: 06/23/2021 CLINICAL DATA:  Dyspnea EXAM: CHEST - 2 VIEW COMPARISON:  06/21/2021 FINDINGS: Patchy bibasilar airspace  disease similar to the prior study. Small effusions. COPD with hyperinflation.  Negative for heart failure or edema Chronic right rib fractures.  Chronic right AC separation. IMPRESSION: Bibasilar airspace disease and small effusions unchanged. Possible pneumonia. Electronically Signed   By: Franchot Gallo M.D.   On: 06/23/2021 11:22   CT Head Wo Contrast  Result Date: 06/23/2021 CLINICAL DATA:  Mental status change, unknown cause EXAM: CT HEAD WITHOUT CONTRAST TECHNIQUE: Contiguous axial images were obtained from the base of the skull through the vertex without intravenous contrast. RADIATION DOSE REDUCTION: This exam was performed according to the departmental dose-optimization program which includes automated exposure control, adjustment of the mA and/or kV according to patient size and/or use of iterative reconstruction technique. COMPARISON:  04/24/2021 FINDINGS: Brain: There is no acute intracranial hemorrhage, mass effect, or edema. Gray-white differentiation is preserved. There is no extra-axial fluid collection. Prominence of the ventricles and sulci reflects similar parenchymal volume loss. Minimal patchy low-density in the supratentorial white matter is nonspecific but could reflect minor chronic microvascular ischemic changes. Vascular: There is atherosclerotic calcification at the skull base. Skull: Calvarium is unremarkable. Sinuses/Orbits: No acute finding. Other: None. IMPRESSION: No acute intracranial abnormality. Electronically Signed   By: Macy Mis M.D.   On: 06/23/2021 12:13   CT Chest Wo Contrast  Result Date: 06/23/2021 CLINICAL DATA:  Pneumonia, complication suspected, xray done EXAM: CT CHEST WITHOUT CONTRAST TECHNIQUE: Multidetector CT imaging of the chest was performed following the standard protocol without IV contrast. RADIATION DOSE REDUCTION: This exam was performed according to the departmental dose-optimization program which includes automated exposure control, adjustment  of the mA and/or kV according to patient size and/or use of iterative reconstruction technique. COMPARISON:  X-ray 06/23/2021.  CT 05/06/2021 FINDINGS: Cardiovascular: Stable cardiomegaly. No pericardial effusion. Postsurgical changes to the mitral valve. Mid ascending thoracic aorta measures up to 3.9 cm in diameter. Atherosclerotic calcifications of the aorta and coronary arteries. Central pulmonary  arteries are mildly dilated suggesting pulmonary arterial hypertension. Mediastinum/Nodes: No axillary or mediastinal lymphadenopathy. Evaluation of the hilar structures is limited in the absence of intravenous contrast. Within this limitation, no obvious hilar adenopathy or mass is identified. Trachea, esophagus, and thyroid gland appear within normal limits. Lungs/Pleura: Small to moderate sized bilateral pleural effusions with associated compressive atelectasis within the lung bases. Elsewhere, the lungs are clear. No pneumothorax. Upper Abdomen: No acute abnormality. Musculoskeletal: Redemonstration of multiple subacute posterior right-sided rib fractures. No new or acute bony findings. Mild bilateral gynecomastia. Otherwise, no chest wall abnormality. IMPRESSION: 1. Small-to-moderate sized bilateral pleural effusions with associated bibasilar compressive atelectasis. 2. Redemonstration of multiple subacute posterior right-sided rib fractures. No residual pneumothorax. 3. Stable cardiomegaly. 4. Mildly dilated central pulmonary arteries suggesting pulmonary arterial hypertension. 5. Aortic and coronary artery atherosclerosis (ICD10-I70.0). Electronically Signed   By: Davina Poke D.O.   On: 06/23/2021 12:59    Procedures Procedures    Medications Ordered in ED Medications  furosemide (LASIX) injection 40 mg (40 mg Intravenous Given 06/23/21 1404)    ED Course/ Medical Decision Making/ A&P                           Medical Decision Making Amount and/or Complexity of Data Reviewed Labs:  ordered. Radiology: ordered.  Risk Prescription drug management. Decision regarding hospitalization.   Patient symptoms appear to primarily be from CHF.  It is hard to tell if he is dehydrated versus fluid overloaded but for sure he has peripheral edema and his chest x-ray shows pleural effusions/edema.  While he might not be eating and drinking very well, I think he probably needs diuresis.  He was given IV Lasix.  His labs have been reviewed/interpreted and his WBC is normal along with slightly elevated but flat troponins.  BNP is elevated.  Sodium is a little bit downtrending compared to baseline.  Overall, chest x-ray was reviewed by myself and shows these effusions with questionable pneumonia.  Given the questionable part and recent treatment for pneumonia, CT was obtained and shows no pneumonia but mostly fluid.  Given this I do not think antibiotics are warranted and discussed this with patient and family.  Otherwise he has such difficulty getting up at home I think he will need admission for diuresis and see if this helps with his weakness/shortness of breath.  I discussed this with the hospitalist, Dr. Roosevelt Locks, for admission.  His ECG has been interpreted by myself and shows no acute ischemia.  Stable for admission.        Final Clinical Impression(s) / ED Diagnoses Final diagnoses:  Acute on chronic diastolic congestive heart failure [facility])    Rx / DC Orders ED Discharge Orders     None         Sherwood Gambler, MD 06/23/21 1540

## 2021-06-24 DIAGNOSIS — F3289 Other specified depressive episodes: Secondary | ICD-10-CM | POA: Diagnosis not present

## 2021-06-24 DIAGNOSIS — J90 Pleural effusion, not elsewhere classified: Secondary | ICD-10-CM | POA: Diagnosis not present

## 2021-06-24 DIAGNOSIS — I5033 Acute on chronic diastolic (congestive) heart failure: Secondary | ICD-10-CM | POA: Diagnosis not present

## 2021-06-24 DIAGNOSIS — N1832 Chronic kidney disease, stage 3b: Secondary | ICD-10-CM | POA: Diagnosis not present

## 2021-06-24 DIAGNOSIS — E785 Hyperlipidemia, unspecified: Secondary | ICD-10-CM | POA: Diagnosis not present

## 2021-06-24 DIAGNOSIS — R7989 Other specified abnormal findings of blood chemistry: Secondary | ICD-10-CM | POA: Diagnosis not present

## 2021-06-24 DIAGNOSIS — E1169 Type 2 diabetes mellitus with other specified complication: Secondary | ICD-10-CM | POA: Diagnosis not present

## 2021-06-24 DIAGNOSIS — E039 Hypothyroidism, unspecified: Secondary | ICD-10-CM | POA: Diagnosis not present

## 2021-06-24 DIAGNOSIS — R69 Illness, unspecified: Secondary | ICD-10-CM | POA: Diagnosis not present

## 2021-06-24 LAB — COMPREHENSIVE METABOLIC PANEL
ALT: 157 U/L — ABNORMAL HIGH (ref 0–44)
AST: 133 U/L — ABNORMAL HIGH (ref 15–41)
Albumin: 3 g/dL — ABNORMAL LOW (ref 3.5–5.0)
Alkaline Phosphatase: 130 U/L — ABNORMAL HIGH (ref 38–126)
Anion gap: 12 (ref 5–15)
BUN: 22 mg/dL (ref 8–23)
CO2: 21 mmol/L — ABNORMAL LOW (ref 22–32)
Calcium: 8.6 mg/dL — ABNORMAL LOW (ref 8.9–10.3)
Chloride: 95 mmol/L — ABNORMAL LOW (ref 98–111)
Creatinine, Ser: 1.34 mg/dL — ABNORMAL HIGH (ref 0.61–1.24)
GFR, Estimated: 51 mL/min — ABNORMAL LOW (ref >60)
Glucose, Bld: 170 mg/dL — ABNORMAL HIGH (ref 70–99)
Potassium: 3.5 mmol/L (ref 3.5–5.1)
Sodium: 128 mmol/L — ABNORMAL LOW (ref 135–145)
Total Bilirubin: 0.9 mg/dL (ref 0.3–1.2)
Total Protein: 5.6 g/dL — ABNORMAL LOW (ref 6.5–8.1)

## 2021-06-24 LAB — CBC
HCT: 35.2 % — ABNORMAL LOW (ref 39.0–52.0)
Hemoglobin: 12.2 g/dL — ABNORMAL LOW (ref 13.0–17.0)
MCH: 31.5 pg (ref 26.0–34.0)
MCHC: 34.7 g/dL (ref 30.0–36.0)
MCV: 91 fL (ref 80.0–100.0)
Platelets: 143 10*3/uL — ABNORMAL LOW (ref 150–400)
RBC: 3.87 MIL/uL — ABNORMAL LOW (ref 4.22–5.81)
RDW: 14.7 % (ref 11.5–15.5)
WBC: 8.1 10*3/uL (ref 4.0–10.5)
nRBC: 0 % (ref 0.0–0.2)

## 2021-06-24 LAB — CK: Total CK: 111 U/L (ref 49–397)

## 2021-06-24 LAB — HEPATITIS PANEL, ACUTE
HCV Ab: NONREACTIVE
Hep A IgM: NONREACTIVE
Hep B C IgM: NONREACTIVE
Hepatitis B Surface Ag: NONREACTIVE

## 2021-06-24 LAB — HEMOGLOBIN A1C
Hgb A1c MFr Bld: 7.1 % — ABNORMAL HIGH (ref 4.8–5.6)
Mean Plasma Glucose: 157.07 mg/dL

## 2021-06-24 LAB — GLUCOSE, CAPILLARY
Glucose-Capillary: 167 mg/dL — ABNORMAL HIGH (ref 70–99)
Glucose-Capillary: 165 mg/dL — ABNORMAL HIGH (ref 70–99)
Glucose-Capillary: 149 mg/dL — ABNORMAL HIGH (ref 70–99)
Glucose-Capillary: 173 mg/dL — ABNORMAL HIGH (ref 70–99)

## 2021-06-24 LAB — TSH: TSH: 10.98 u[IU]/mL — ABNORMAL HIGH (ref 0.350–4.500)

## 2021-06-24 LAB — OSMOLALITY: Osmolality: 277 mOsm/kg (ref 275–295)

## 2021-06-24 MED ORDER — ENSURE ENLIVE PO LIQD
237.0000 mL | Freq: Two times a day (BID) | ORAL | Status: DC
  Administered 2021-06-25: 237 mL via ORAL

## 2021-06-24 MED ORDER — ADULT MULTIVITAMIN W/MINERALS CH
1.0000 | ORAL_TABLET | Freq: Every day | ORAL | Status: DC
  Administered 2021-06-24 – 2021-06-25 (×2): 1 via ORAL
  Filled 2021-06-24 (×2): qty 1

## 2021-06-24 MED ORDER — VENLAFAXINE HCL ER 37.5 MG PO CP24
37.5000 mg | ORAL_CAPSULE | Freq: Every day | ORAL | Status: DC
  Administered 2021-06-25: 37.5 mg via ORAL
  Filled 2021-06-24: qty 1

## 2021-06-24 NOTE — Assessment & Plan Note (Addendum)
Hyponatremia.  His renal function remained stable, and tolerated well diuresis, at the time of his discharge his serum cr is 1,35 with K at 3,8 and serum bicarbonate at 25.  Na is improving, today at 129.  Plan to continue diuresis with oral furosemide and follow renal function and electrolytes as outpatient.

## 2021-06-24 NOTE — Care Management Obs Status (Signed)
MEDICARE OBSERVATION STATUS NOTIFICATION   Patient Details  Name: DEKLAN MINAR MRN: [id number] Date of Birth: 02-May-1931   Medicare Observation Status Notification Given:  Yes    Zenon Mayo, RN 06/24/2021, 12:51 PM

## 2021-06-24 NOTE — TOC Progression Note (Addendum)
Transition of Care [facility]) - Progression Note    Patient Details  Name: Adrian Olson MRN: [id number] Date of Birth: [birth year]  [facility] CM/SW Contact  Zenon Mayo, RN Phone Number: 06/24/2021, 12:57 PM  Clinical Narrative:    from home with wife, daughter involved, chf, conts on iv lasix 40 bid, abd u/s yesterday is negative. Patient had [place] and would like to continue with [place] for [place], [place], will add HHRN for disease management. Daughter is at the bedside, she takes both parents to MD apts and assist with other things they need.  Patient also has a regular two wheeled walker.   TOC will continue to follow for dc needs.     Expected Discharge Plan: [place] Barriers to Discharge: Continued Medical Work up  Expected Discharge Plan and Services Expected Discharge Plan: [place]   Discharge Planning Services: CM Consult Post Acute Care Choice: [place] arrangements for the past 2 months: Single Family Home                   DME Agency: NA       HH Arranged: RN, PT, OT HH Agency: [place] Date [place]: 06/24/21 Time [place]: 3491 Representative spoke with at [place]: [place] ([place]) Interventions    Readmission Risk Interventions No flowsheet data found.

## 2021-06-24 NOTE — Assessment & Plan Note (Addendum)
His glucose has remained stable, at the time of his discharge is 149, will continue outpatient follow up.

## 2021-06-24 NOTE — Progress Notes (Addendum)
Progress Note   Patient: Adrian Olson MMN:[id number] DOB: 04/10/1931 DOA: 06/23/2021     0 DOS: the patient was seen and examined on 06/24/2021   Brief hospital course: Adrian Olson was admitted to the hospital with the working diagnosis of decompensated heart failure.   88 yo male with the past medical history of sever mitral regurgitation sp mitral clip in 2021, heart failure, recurrent pleural effusions, HTN, T2DM, hypothyroidism, hyponatremia and BPH who presented with dyspnea and fatigue. Reported worsening dyspnea on exertion for the last 7 days, to the point where he became symptomatic with minimal efforts. Patient was evaluated by pulmonary 01/30 for a recurrent right pleural effusion that was treated at the end of last year with thoracentesis and thoracostomy tube. He had furosemide prescribed with no improvement in his symptoms. On  his initial physical examination his blood pressure was 127/98 HR 57, rr 23 and oxygen saturation 93% on supplemental 02 per [name]. His lungs had rales bilaterally, with no wheezing, heart with S1 and S2 present, rhythmic, positive murmur at the apex systolic, abdomen soft and positive lower extremity edema.   Na 125, K 4,7, Cl 93, bicarbonate 21, glucose 177, bun 27 and cr at 1,33 Ast 165 Alt 189  BNP 1,489 High sensitive troponin 27  Wbc 8,3, hgb 13,2, hct 39,3 and plt 98   SARS covid 19 negative   Urine analysis with SG 1,020 with negative nitrates and positive protein 30  Chest radiograph with hyperinflation, positive hilar vascular congestion bilaterally, with bilateral pleural effusions greater right than left with right base atelectasis.  CT chest with bilateral pleural effusions, right greater than left with right loculation and atelectasis. Multiple right sided rib fractures.   Head Ct with no acute changes.   EKG with 61 bpm, normal axis, first degree av block with sinus rhythm, poor R wave progression, no significant ST segment or T wave  changes.     Assessment and Plan: * CHF (congestive heart failure) ([place]) Patient with improvement in volume status but not back to baseline.  Old records personally reviewed echocardiogram from 12.2022 with preserved LV systolic function EF 55 to 60% with mild LVH, preserved RV systolic function. Moderate mitral valve regurgitation.   Documented urine output is 2000 ml  Blood pressure stable.   Plan to continue diuresis with IV furosemide. Continue blood pressure control with metoprolol and ramipril.     Stage 3b chronic kidney disease (CKD) (HCC) Hyponatremia.  Renal function with serum cr at 1,3 with K at 3,5 and serum bicarbonate at 21.  CL is 95  Plan to continue diuresis with furosemide  Follow up renal function and electrolytes in am.   Essential hypertension- (present on admission) Continue blood pressure control with metoprolol and ramipril.  Continue diuresis with furosemide.   Loculated pleural effusion- (present on admission) Positive loculation on right side, patient with worsening dyspnea and signs of acute cardiogenic pulmonary edema. Continue diuresis with furosemide.  Follow up chest film in am.   Type 2 diabetes mellitus with hyperlipidemia ([place]) Patient is tolerating po well.  Plan to continue glucose cover and monitoring with insulin sliding scale.   Hypothyroidism- (present on admission) Continue levothyroxine.   LFT elevation- (present on admission) Possible congested hepatopathy, no signs of liver failure, will follow up liver enzymes in am after further diuresis.   Depression- (present on admission) Continue with venlafaxine.         Subjective: Patient with improvement in dyspnea but not back to  baseline, no nausea or vomiting, continue to be very weak and deconditioned   Physical Exam: Vitals:   06/24/21 0325 06/24/21 0443 06/24/21 0854 06/24/21 1129  BP:  116/78  121/69  Pulse:  (!) 43  61  Resp:  18  20  Temp:  97.8 F (36.6 C)  (!) 97.4 F (36.3 C) (!) 97.2 F (36.2 C)  TempSrc:  Oral Oral Oral  SpO2:  90%  93%  Weight: 62.9 kg     Height:       Neurology awake and alert ENT no pallor Cardiovascular with heart S1 and S2 present and rhythmic with no rubs, or gallops, positive systolic murmur at the apex No JVD No lower extremity edema Respiratory with decreased breath sounds at the right base with no wheezing or rhonchi Abdomen soft and non tender  Data Reviewed:  Noted   Family Communication: I spoke with patient's wife and daughter in law at the bedside, we talked in detail about patient's condition, plan of care and prognosis and all questions were addressed.   Disposition: Status is: Observation   Planned Discharge Destination: Home     Author: Tawni Millers, MD 06/24/2021 1:22 PM  For on call review [email or web address].

## 2021-06-24 NOTE — Progress Notes (Signed)
Patient been in on and off Afib in the monitor but mainly sinus rhythm and sinus brady

## 2021-06-24 NOTE — Assessment & Plan Note (Addendum)
On venlafaxine dose being reduced as outpatient.

## 2021-06-24 NOTE — Progress Notes (Signed)
Initial Nutrition Assessment  DOCUMENTATION CODES:   Not applicable  INTERVENTION:   Multivitamin w/ minerals daily Ensure Enlive po BID, each supplement provides 350 kcal and 20 grams of protein. Encourage good PO intake  NUTRITION DIAGNOSIS:   Increased nutrient needs related to chronic illness as evidenced by estimated needs.  GOAL:   Patient will meet greater than or equal to 90% of their needs  MONITOR:   PO intake, Supplement acceptance, Labs, Weight trends  REASON FOR ASSESSMENT:   Consult Assessment of nutrition requirement/status  ASSESSMENT:   88 y.o. male presented to the ED with SOB and fatigue. PMH includes HTN, DM, and CHF. Pt admitted with CHF exacerbation.   Pt in restroom at time of visit, spoke with family at bedside.   Wife reports that his appetite has been poor for ~2 weeks now, but seems to be improving. Reports that he normally eats very well. Family reports that pt typically drinks 1/2 nutrition supplement per day at home.  Per EMR, pt ate 100% of his breakfast today.  Per family pt UBW was around 140-150#. Family was able to confirm that they have noticed weight loss. Pt was receiving PT at home as well.   Discussed that we only have Ensure here at hospital, family reports that pt is ok with that.  Medications reviewed and include: Vitamin D3, Lasix, SSI 0-9 units TID Labs reviewed: Sodium 128, Hgb A1c 7.1%, 24 hr CBG 156-167  UOP: 2000 mL x 24 hr I/O's: -2.7 L since admit   NUTRITION - FOCUSED PHYSICAL EXAM:  Deferred to follow-up.   Diet Order:   Diet Order             Diet regular Room service appropriate? Yes; Fluid consistency: Thin; Fluid restriction: 1200 mL Fluid  Diet effective now                   EDUCATION NEEDS:   No education needs have been identified at this time  Skin:  Skin Assessment: Reviewed RN Assessment  Last BM:  2/9  Height:   Ht Readings from Last 1 Encounters:  06/23/21 5\' 9"  (1.753 m)     Weight:   Wt Readings from Last 1 Encounters:  06/24/21 62.9 kg    Ideal Body Weight:  72.7 kg  BMI:  Body mass index is 20.48 kg/m.  Estimated Nutritional Needs:   Kcal:  1900-2100  Protein:  95-110 grams  Fluid:  >/= 1.9 L    Annastacia Duba Louie Casa, RD, LDN Clinical Dietitian See [facility] for contact information.

## 2021-06-24 NOTE — Assessment & Plan Note (Addendum)
On levothyroxine  ?

## 2021-06-24 NOTE — Progress Notes (Signed)
Heart Failure Nurse Navigator Progress Note  Patient assessed for HF TOC appropriateness.  I delivered an appt card and discussed with patient and family members attending an appointment after discharge in the HF [facility].  They are aware and agreeable.

## 2021-06-24 NOTE — Evaluation (Signed)
Physical Therapy Evaluation Patient Details Name: Adrian Olson MRN: [id number] DOB: 1930/08/16 Today's Date: 06/24/2021  History of Present Illness  The pt is an 88 yo male presenting 2/9 with SOB, AMS, fatigue, falls, and lethargy. Upon work up, pt found to have CHF exacerbation with some pleural effusions/edema. PMH includes: severe MR status post clipping 2021, HFpEF,recurrent pleural effusion, HTN, HLD, IIDM, chronic hyponatremia, hypothyroidism, BPH.   Clinical Impression  Pt in bed upon arrival of PT, agreeable to evaluation at this time. Prior to admission the pt was living at home with his wife, requiring increased assist with mobility since falls in November of 2022. The pt required supervision for ADLs and mobility PTA, not needing minA to complete sit-stand transfers and minA to steady with gait. The pt is at significant risk of falls and will benefit from skilled PT to continue to progress LE strength and balance following return home. Will need physical assist with any OOB mobility, if family unable to provide may need short stint SNF for rehab prior to return home.   30 Second Sit-Stand: The patient completed 8 sit-stands in a 30 second period. ( < 12 for individuals aged 32 - 39 indicates below average activity tolerance and increased risk of falls)    Recommendations for follow up therapy are one component of a multi-disciplinary discharge planning process, led by the attending physician.  Recommendations may be updated based on patient status, additional functional criteria and insurance authorization.  Follow Up Recommendations Home health PT    Assistance Recommended at Discharge Frequent or constant Supervision/Assistance  Patient can return home with the following  A little help with walking and/or transfers;A little help with bathing/dressing/bathroom;Direct supervision/assist for medications management;Direct supervision/assist for financial management;Assist for  transportation;Help with stairs or ramp for entrance    Equipment Recommendations Rolling walker (2 wheels)  Recommendations for Other Services       Functional Status Assessment Patient has had a recent decline in their functional status and demonstrates the ability to make significant improvements in function in a reasonable and predictable amount of time.     Precautions / Restrictions Precautions Precautions: Fall Precaution Comments: watch SpO2 Restrictions Weight Bearing Restrictions: No      Mobility  Bed Mobility Overal bed mobility: Needs Assistance Bed Mobility: Supine to Sit     Supine to sit: Min assist     General bed mobility comments: minA to complete, pt unable to coordinate LE movements or    Transfers Overall transfer level: Needs assistance Equipment used: Rolling walker (2 wheels) Transfers: Sit to/from Stand Sit to Stand: Min assist           General transfer comment: minA to power up and steady in standing    Ambulation/Gait Ambulation/Gait assistance: Min assist Gait Distance (Feet): 15 Feet (+ 15 ft) Assistive device: Rolling walker (2 wheels) Gait Pattern/deviations: Step-to pattern, Decreased stride length, Shuffle, Trunk flexed Gait velocity: decreased     General Gait Details: pt with small steps and frequent need of minA to steady. no overt LOB but would have posterior LOB without assist     Balance Overall balance assessment: Needs assistance Sitting-balance support: No upper extremity supported, Feet supported Sitting balance-Leahy Scale: Good     Standing balance support: Bilateral upper extremity supported, During functional activity Standing balance-Leahy Scale: Poor Standing balance comment: dependent on BUE support and minA to steady, multiple small posterior LOB and sway standing at sink  Pertinent Vitals/Pain Pain Assessment Pain Assessment: No/denies pain    Home Living  Family/patient expects to be discharged to:: Private residence Living Arrangements: Spouse/significant other Available Help at Discharge: Family;Available 24 hours/day;Other (Comment) Type of Home: House Home Access: Level entry       Home Layout: One level Home Equipment: BSC/3in1;Rollator (4 wheels);Hand held shower head Additional Comments: Hearing aids at home.    Prior Function Prior Level of Function : Needs assist;History of Falls (last six months)       Physical Assist : Mobility (physical);ADLs (physical) Mobility (physical): Gait ADLs (physical): Bathing;Dressing;IADLs Mobility Comments: reports use of rollator and supervision for gait. currently working with HHPT ADLs Comments: increased time with ADLs, wife assists with LB dressing and bathing.     Hand Dominance   Dominant Hand: Right    Extremity/Trunk Assessment   Upper Extremity Assessment Upper Extremity Assessment: Generalized weakness    Lower Extremity Assessment Lower Extremity Assessment: Generalized weakness    Cervical / Trunk Assessment Cervical / Trunk Assessment: Kyphotic  Communication   Communication: HOH  Cognition Arousal/Alertness: Awake/alert Behavior During Therapy: WFL for tasks assessed/performed Overall Cognitive Status: Within Functional Limits for tasks assessed                                          General Comments General comments (skin integrity, edema, etc.): pt with continuous soft ooze of formed BM, NT and RN notified. pt wanting GI consult or appt after d/c    Exercises     Assessment/Plan    PT Assessment Patient needs continued PT services  PT Problem List Decreased strength;Decreased balance;Decreased mobility;Decreased coordination       PT Treatment Interventions DME instruction;Gait training;Functional mobility training;Stair training;Therapeutic activities;Therapeutic exercise;Balance training;Patient/family education    PT Goals  (Current goals can be found in the Care Plan section)  Acute Rehab PT Goals Patient Stated Goal: reduce risk of falls, return home PT Goal Formulation: With patient Time For Goal Achievement: 07/08/21 Potential to Achieve Goals: Good    Frequency Min 3X/week        AM-PAC PT "6 Clicks" Mobility  Outcome Measure Help needed turning from your back to your side while in a flat bed without using bedrails?: None Help needed moving from lying on your back to sitting on the side of a flat bed without using bedrails?: None Help needed moving to and from a bed to a chair (including a wheelchair)?: A Little Help needed standing up from a chair using your arms (e.g., wheelchair or bedside chair)?: A Little Help needed to walk in hospital room?: A Little Help needed climbing 3-5 steps with a railing? : A Lot 6 Click Score: 19    End of Session Equipment Utilized During Treatment: Gait belt Activity Tolerance: Patient tolerated treatment well Patient left: in chair;with call bell/phone within reach Nurse Communication: Mobility status;Other (comment) (BM) PT Visit Diagnosis: Other abnormalities of gait and mobility (R26.89);Repeated falls (R29.6);Unsteadiness on feet (R26.81)    Time: 1045-1130 PT Time Calculation (min) (ACUTE ONLY): 45 min   Charges:   PT Evaluation $PT Eval Moderate Complexity: 1 Mod PT Treatments $Therapeutic Exercise: 8-22 mins $Therapeutic Activity: 8-22 mins        [place] Carbo, PT, DPT   Acute Rehabilitation Department Pager #: [phone]  Sandra Cockayne 06/24/2021, 12:51 PM

## 2021-06-24 NOTE — Hospital Course (Addendum)
Adrian Olson was admitted to the hospital with the working diagnosis of decompensated heart failure.   88 yo male with the past medical history of severe mitral regurgitation sp mitral clip in 2021, heart failure, recurrent pleural effusions, HTN, T2DM, hypothyroidism, hyponatremia and BPH who presented with dyspnea and fatigue. Reported worsening dyspnea on exertion for the last 7 days, to the point where he became symptomatic with minimal efforts. Patient was evaluated by pulmonary 01/30 for a recurrent right pleural effusion that was treated at the end of last year with thoracentesis and thoracostomy tube. He had furosemide prescribed with no improvement in his symptoms. On  his initial physical examination his blood pressure was 127/98 HR 57, rr 23 and oxygen saturation 93% on supplemental 02 per [place]. His lungs had rales bilaterally, with no wheezing, heart with S1 and S2 present, rhythmic, positive murmur at the apex systolic, abdomen soft and positive lower extremity edema.   Na 125, K 4,7, Cl 93, bicarbonate 21, glucose 177, bun 27 and cr at 1,33 Ast 165 Alt 189  BNP 1,489 High sensitive troponin 27  Wbc 8,3, hgb 13,2, hct 39,3 and plt 98   SARS covid 19 negative   Urine analysis with SG 1,020 with negative nitrates and positive protein 30  Chest radiograph with hyperinflation, positive hilar vascular congestion bilaterally, with bilateral pleural effusions greater right than left with right base atelectasis.  CT chest with bilateral pleural effusions, right greater than left with right loculation and atelectasis. Multiple right sided rib fractures.   Head Ct with no acute changes.   EKG with 61 bpm, normal axis, first degree av block with sinus rhythm, poor R wave progression, no significant ST segment or T wave changes.  Patient was placed on IV furosemide with improvement in his volume status. Dose of furosemide has been increased, he will follow up as outpatient.

## 2021-06-24 NOTE — Assessment & Plan Note (Addendum)
Follow up chest film with right sided pleural effusion, personally reviewed.  Positive loculation but no clinical signs of infection, plan to follow up as outpatient with pulmonary.

## 2021-06-24 NOTE — Assessment & Plan Note (Addendum)
Liver enzymes have been trending down, plan to follow as outpatient.

## 2021-06-24 NOTE — Assessment & Plan Note (Addendum)
Blood pressure control with ramipril and metoprolol.

## 2021-06-24 NOTE — TOC Progression Note (Addendum)
Transition of Care [facility]) - Progression Note    Patient Details  Name: Adrian Olson MRN: [id number] Date of Birth: Jun 05, 1930  [facility]) CM/SW Contact  Zenon Mayo, RN Phone Number: 06/24/2021, 10:09 AM  Clinical Narrative:    from home with wife, daughter involved, chf, conts on iv lasix 40 bid, abd u/s yesterday is negative. Patient had [place] and would like to continue with [facility] for [place], [place], will add HHRN for disease management.   TOC will continue to follow for dc needs.        Expected Discharge Plan and Services                                                 Social Determinants of Health (SDOH) Interventions    Readmission Risk Interventions No flowsheet data found.

## 2021-06-24 NOTE — Assessment & Plan Note (Addendum)
Patient was admitted to the cardiac ward and was placed on aggressive diuresis with IV furosemide, negative fluid balance was achieved, 4,680 ml since admission with significant improvement in his symptoms.   Old records personally reviewed echocardiogram from 12.2022 with preserved LV systolic function EF 55 to 60% with mild LVH, preserved RV systolic function. Moderate mitral valve regurgitation.   Patient will continue heart failure management with metoprolol and ramipril Dose of furosemide has been increased to 60 mg daily with close follow up as outpatient.

## 2021-06-25 ENCOUNTER — Observation Stay (HOSPITAL_COMMUNITY): Payer: Medicare HMO

## 2021-06-25 DIAGNOSIS — E1169 Type 2 diabetes mellitus with other specified complication: Secondary | ICD-10-CM | POA: Diagnosis not present

## 2021-06-25 DIAGNOSIS — R69 Illness, unspecified: Secondary | ICD-10-CM | POA: Diagnosis not present

## 2021-06-25 DIAGNOSIS — J90 Pleural effusion, not elsewhere classified: Secondary | ICD-10-CM | POA: Diagnosis not present

## 2021-06-25 DIAGNOSIS — E039 Hypothyroidism, unspecified: Secondary | ICD-10-CM | POA: Diagnosis not present

## 2021-06-25 DIAGNOSIS — N1832 Chronic kidney disease, stage 3b: Secondary | ICD-10-CM | POA: Diagnosis not present

## 2021-06-25 DIAGNOSIS — R7989 Other specified abnormal findings of blood chemistry: Secondary | ICD-10-CM | POA: Diagnosis not present

## 2021-06-25 DIAGNOSIS — F3289 Other specified depressive episodes: Secondary | ICD-10-CM | POA: Diagnosis not present

## 2021-06-25 DIAGNOSIS — E785 Hyperlipidemia, unspecified: Secondary | ICD-10-CM | POA: Diagnosis not present

## 2021-06-25 DIAGNOSIS — I5033 Acute on chronic diastolic (congestive) heart failure: Secondary | ICD-10-CM | POA: Diagnosis not present

## 2021-06-25 LAB — BASIC METABOLIC PANEL
Anion gap: 11 (ref 5–15)
BUN: 23 mg/dL (ref 8–23)
CO2: 25 mmol/L (ref 22–32)
Calcium: 8.6 mg/dL — ABNORMAL LOW (ref 8.9–10.3)
Chloride: 93 mmol/L — ABNORMAL LOW (ref 98–111)
Creatinine, Ser: 1.35 mg/dL — ABNORMAL HIGH (ref 0.61–1.24)
GFR, Estimated: 50 mL/min — ABNORMAL LOW (ref >60)
Glucose, Bld: 149 mg/dL — ABNORMAL HIGH (ref 70–99)
Potassium: 3.8 mmol/L (ref 3.5–5.1)
Sodium: 129 mmol/L — ABNORMAL LOW (ref 135–145)

## 2021-06-25 LAB — GLUCOSE, CAPILLARY
Glucose-Capillary: 152 mg/dL — ABNORMAL HIGH (ref 70–99)
Glucose-Capillary: 170 mg/dL — ABNORMAL HIGH (ref 70–99)

## 2021-06-25 MED ORDER — VENLAFAXINE HCL ER 37.5 MG PO CP24: 37.5000 mg | ORAL_CAPSULE | Freq: Every day | ORAL | Status: DC

## 2021-06-25 MED ORDER — FUROSEMIDE 40 MG PO TABS: 60.0000 mg | ORAL_TABLET | Freq: Every day | ORAL | 0 refills | Status: DC

## 2021-06-25 MED ORDER — ENSURE ENLIVE PO LIQD: 237.0000 mL | Freq: Two times a day (BID) | ORAL | 0 refills | Status: AC

## 2021-06-25 MED ORDER — ADULT MULTIVITAMIN W/MINERALS CH: 1.0000 | ORAL_TABLET | Freq: Every day | ORAL | 0 refills | Status: AC

## 2021-06-25 MED ORDER — FUROSEMIDE 40 MG PO TABS: 60.0000 mg | ORAL_TABLET | Freq: Every day | ORAL | Status: DC

## 2021-06-25 MED ORDER — POTASSIUM CHLORIDE CRYS ER 20 MEQ PO TBCR: 10.0000 meq | EXTENDED_RELEASE_TABLET | Freq: Every day | ORAL | 0 refills | Status: DC

## 2021-06-25 NOTE — Discharge Summary (Signed)
Physician Discharge Summary   Patient: Adrian Olson MRN: [id number] DOB: March 28, 1931  Admit date:     06/23/2021  Discharge date: 06/25/21  Discharge Physician: Jimmy Picket Tanara Turvey   PCP: Merrilee Seashore, MD   Recommendations at discharge:    Increased dose of furosemide to 60 mg daily Continue K supplementation 10 meq daily  Follow up renal function and electrolytes in 7 days.   I spoke over the phone with the patient's daughter about patient's  condition, plan of care, prognosis and all questions were addressed.   Discharge Diagnoses: Principal Problem:   CHF (congestive heart failure) (HCC) Active Problems:   Stage 3b chronic kidney disease (CKD) (HCC)   Essential hypertension   Loculated pleural effusion   Type 2 diabetes mellitus with hyperlipidemia (HCC)   Hypothyroidism   LFT elevation   Depression  Resolved Problems:   * No resolved hospital problems. [facility] Course: Adrian Olson was admitted to the hospital with the working diagnosis of decompensated heart failure.   88 yo male with the past medical history of severe mitral regurgitation sp mitral clip in 2021, heart failure, recurrent pleural effusions, HTN, T2DM, hypothyroidism, hyponatremia and BPH who presented with dyspnea and fatigue. Reported worsening dyspnea on exertion for the last 7 days, to the point where he became symptomatic with minimal efforts. Patient was evaluated by pulmonary 01/30 for a recurrent right pleural effusion that was treated at the end of last year with thoracentesis and thoracostomy tube. He had furosemide prescribed with no improvement in his symptoms. On  his initial physical examination his blood pressure was 127/98 HR 57, rr 23 and oxygen saturation 93% on supplemental 02 per [place]. His lungs had rales bilaterally, with no wheezing, heart with S1 and S2 present, rhythmic, positive murmur at the apex systolic, abdomen soft and positive lower extremity edema.   Na 125, K 4,7,  Cl 93, bicarbonate 21, glucose 177, bun 27 and cr at 1,33 Ast 165 Alt 189  BNP 1,489 High sensitive troponin 27  Wbc 8,3, hgb 13,2, hct 39,3 and plt 98   SARS covid 19 negative   Urine analysis with SG 1,020 with negative nitrates and positive protein 30  Chest radiograph with hyperinflation, positive hilar vascular congestion bilaterally, with bilateral pleural effusions greater right than left with right base atelectasis.  CT chest with bilateral pleural effusions, right greater than left with right loculation and atelectasis. Multiple right sided rib fractures.   Head Ct with no acute changes.   EKG with 61 bpm, normal axis, first degree av block with sinus rhythm, poor R wave progression, no significant ST segment or T wave changes.  Patient was placed on IV furosemide with improvement in his volume status. Dose of furosemide has been increased, he will follow up as outpatient.      Assessment and Plan: * CHF (congestive heart failure) ([place]) Patient was admitted to the cardiac ward and was placed on aggressive diuresis with IV furosemide, negative fluid balance was achieved, 4,680 ml since admission with significant improvement in his symptoms.   Old records personally reviewed echocardiogram from 12.2022 with preserved LV systolic function EF 55 to 60% with mild LVH, preserved RV systolic function. Moderate mitral valve regurgitation.   Patient will continue heart failure management with metoprolol and ramipril Dose of furosemide has been increased to 60 mg daily with close follow up as outpatient.    Stage 3b chronic kidney disease (CKD) (HCC) Hyponatremia.  His renal function remained  stable, and tolerated well diuresis, at the time of his discharge his serum cr is 1,35 with K at 3,8 and serum bicarbonate at 25.  Na is improving, today at 129.  Plan to continue diuresis with oral furosemide and follow renal function and electrolytes as outpatient.    Essential  hypertension- (present on admission) Blood pressure control with ramipril and metoprolol.   Loculated pleural effusion- (present on admission) Follow up chest film with right sided pleural effusion, personally reviewed.  Positive loculation but no clinical signs of infection, plan to follow up as outpatient with pulmonary.    Type 2 diabetes mellitus with hyperlipidemia (HCC) His glucose has remained stable, at the time of his discharge is 149, will continue outpatient follow up.   Hypothyroidism- (present on admission) On levothyroxine.   LFT elevation- (present on admission) Liver enzymes have been trending down, plan to follow as outpatient.   Depression- (present on admission) On venlafaxine dose being reduced as outpatient.            Consultants: none  Procedures performed: none   Disposition: Home Diet recommendation:  Cardiac diet  DISCHARGE MEDICATION: Allergies as of 06/25/2021       Reactions   Duloxetine Hcl Other (See Comments)   withdrawal        Medication List     STOP taking these medications    glycerin adult 2 g suppository   polyethylene glycol 236 g solution Commonly known as: GoLYTELY   polyethylene glycol powder 17 GM/SCOOP powder Commonly known as: GLYCOLAX/MIRALAX       TAKE these medications    albuterol 108 (90 Base) MCG/ACT inhaler Commonly known as: VENTOLIN HFA Inhale 2 puffs into the lungs every 6 (six) hours as needed for wheezing or shortness of breath.   aspirin 81 MG EC tablet Take 81 mg by mouth daily.   cetirizine 10 MG tablet Commonly known as: ZyrTEC Allergy Take 1 tablet (10 mg total) by mouth daily.   feeding supplement Liqd Take 237 mLs by mouth 2 (two) times daily between meals.   finasteride 5 MG tablet Commonly known as: PROSCAR Take 5 mg by mouth daily at 12 noon.   fluticasone 50 MCG/ACT nasal spray Commonly known as: FLONASE Place 2 sprays into both nostrils daily.   furosemide 40 MG  tablet Commonly known as: LASIX Take 1.5 tablets (60 mg total) by mouth daily. What changed: how much to take   levothyroxine 75 MCG tablet Commonly known as: SYNTHROID Take 75 mcg by mouth daily before breakfast.   metFORMIN 500 MG tablet Commonly known as: GLUCOPHAGE Take 500 mg by mouth every evening.   metoprolol tartrate 25 MG tablet Commonly known as: LOPRESSOR Take 0.5 tablets (12.5 mg total) by mouth 2 (two) times daily.   multivitamin with minerals Tabs tablet Take 1 tablet by mouth daily. Start taking on: June 26, 2021   potassium chloride SA 20 MEQ tablet Commonly known as: KLOR-CON M Take 0.5 tablets (10 mEq total) by mouth daily. What changed: how much to take   pravastatin 20 MG tablet Commonly known as: PRAVACHOL Take 20 mg by mouth at bedtime.   ramipril 5 MG capsule Commonly known as: ALTACE Take 5 mg by mouth daily.   sodium chloride 0.65 % Soln nasal spray Commonly known as: OCEAN Place 1 spray into both nostrils daily as needed for congestion.   tamsulosin 0.4 MG Caps capsule Commonly known as: FLOMAX Take 0.4 mg by mouth daily at 12 noon.  venlafaxine XR 37.5 MG 24 hr capsule Commonly known as: EFFEXOR-XR Take 1 capsule (37.5 mg total) by mouth daily with breakfast.   Vitamin B-12 2000 MCG Tbcr 1 tablet   VITAMIN B1 PO Take 125 mcg by mouth daily.   Vitamin D 50 MCG (2000 UT) tablet Take 2,000 Units by mouth daily at 12 noon.        Follow-up Information     [facility] [place] [facility].   Specialty: [place] Why: HHRN,HHPT, HHOT Contact information: [address] [place] [address] [phone]                 Discharge Exam: Danley Danker Weights   06/23/21 1839 06/24/21 0325 06/25/21 0436  Weight: 69.1 kg 62.9 kg 59.2 kg   Neurology awake and alert ENT no pallor Cardiovascular with S1 and S2 present and rhythmic, positive murmur at the apex, with no gallops. No JVD No lower extremity  edema Respiratory with no wheezing, scattered rales at bases, no rhonchi Abdomen soft and non tender  Condition at discharge: stable  The results of significant diagnostics from this hospitalization (including imaging, microbiology, ancillary and laboratory) are listed below for reference.   Imaging Studies: DG Chest 2 View  Result Date: 06/23/2021 CLINICAL DATA:  Dyspnea EXAM: CHEST - 2 VIEW COMPARISON:  06/21/2021 FINDINGS: Patchy bibasilar airspace disease similar to the prior study. Small effusions. COPD with hyperinflation.  Negative for heart failure or edema Chronic right rib fractures.  Chronic right AC separation. IMPRESSION: Bibasilar airspace disease and small effusions unchanged. Possible pneumonia. Electronically Signed   By: Franchot Gallo M.D.   On: 06/23/2021 11:22   DG Chest 2 View  Result Date: 06/21/2021 CLINICAL DATA:  Increased shortness of breath, cough EXAM: CHEST - 2 VIEW COMPARISON:  06/11/2021 FINDINGS: Small bilateral pleural effusions. Bibasilar patchy opacities. Chronic interstitial changes. Stable cardiomediastinal contours. IMPRESSION: Small bilateral pleural effusions. Patchy caliectasis/consolidation at the bases. Electronically Signed   By: Macy Mis M.D.   On: 06/21/2021 12:00   CT Head Wo Contrast  Result Date: 06/23/2021 CLINICAL DATA:  Mental status change, unknown cause EXAM: CT HEAD WITHOUT CONTRAST TECHNIQUE: Contiguous axial images were obtained from the base of the skull through the vertex without intravenous contrast. RADIATION DOSE REDUCTION: This exam was performed according to the departmental dose-optimization program which includes automated exposure control, adjustment of the mA and/or kV according to patient size and/or use of iterative reconstruction technique. COMPARISON:  04/24/2021 FINDINGS: Brain: There is no acute intracranial hemorrhage, mass effect, or edema. Gray-white differentiation is preserved. There is no extra-axial fluid  collection. Prominence of the ventricles and sulci reflects similar parenchymal volume loss. Minimal patchy low-density in the supratentorial white matter is nonspecific but could reflect minor chronic microvascular ischemic changes. Vascular: There is atherosclerotic calcification at the skull base. Skull: Calvarium is unremarkable. Sinuses/Orbits: No acute finding. Other: None. IMPRESSION: No acute intracranial abnormality. Electronically Signed   By: Macy Mis M.D.   On: 06/23/2021 12:13   CT Chest Wo Contrast  Result Date: 06/23/2021 CLINICAL DATA:  Pneumonia, complication suspected, xray done EXAM: CT CHEST WITHOUT CONTRAST TECHNIQUE: Multidetector CT imaging of the chest was performed following the standard protocol without IV contrast. RADIATION DOSE REDUCTION: This exam was performed according to the departmental dose-optimization program which includes automated exposure control, adjustment of the mA and/or kV according to patient size and/or use of iterative reconstruction technique. COMPARISON:  X-ray 06/23/2021.  CT 05/06/2021 FINDINGS: Cardiovascular:  Stable cardiomegaly. No pericardial effusion. Postsurgical changes to the mitral valve. Mid ascending thoracic aorta measures up to 3.9 cm in diameter. Atherosclerotic calcifications of the aorta and coronary arteries. Central pulmonary arteries are mildly dilated suggesting pulmonary arterial hypertension. Mediastinum/Nodes: No axillary or mediastinal lymphadenopathy. Evaluation of the hilar structures is limited in the absence of intravenous contrast. Within this limitation, no obvious hilar adenopathy or mass is identified. Trachea, esophagus, and thyroid gland appear within normal limits. Lungs/Pleura: Small to moderate sized bilateral pleural effusions with associated compressive atelectasis within the lung bases. Elsewhere, the lungs are clear. No pneumothorax. Upper Abdomen: No acute abnormality. Musculoskeletal: Redemonstration of multiple  subacute posterior right-sided rib fractures. No new or acute bony findings. Mild bilateral gynecomastia. Otherwise, no chest wall abnormality. IMPRESSION: 1. Small-to-moderate sized bilateral pleural effusions with associated bibasilar compressive atelectasis. 2. Redemonstration of multiple subacute posterior right-sided rib fractures. No residual pneumothorax. 3. Stable cardiomegaly. 4. Mildly dilated central pulmonary arteries suggesting pulmonary arterial hypertension. 5. Aortic and coronary artery atherosclerosis (ICD10-I70.0). Electronically Signed   By: Davina Poke D.O.   On: 06/23/2021 12:59   CT ABDOMEN PELVIS W CONTRAST  Result Date: 06/11/2021 CLINICAL DATA:  Constipation EXAM: CT ABDOMEN AND PELVIS WITH CONTRAST TECHNIQUE: Multidetector CT imaging of the abdomen and pelvis was performed using the standard protocol following bolus administration of intravenous contrast. RADIATION DOSE REDUCTION: This exam was performed according to the departmental dose-optimization program which includes automated exposure control, adjustment of the mA and/or kV according to patient size and/or use of iterative reconstruction technique. CONTRAST:  129mL OMNIPAQUE IOHEXOL 300 MG/ML  SOLN COMPARISON:  CT abdomen/pelvis 04/24/2021 FINDINGS: Lower chest: There are bilateral pleural effusions with adjacent atelectasis, slightly decreased in size since the prior CT from 04/24/2021. The heart is enlarged. A mitral valve prosthesis is noted. Hepatobiliary: The liver and gallbladder are unremarkable. There is no biliary ductal dilatation. Pancreas: Unremarkable. Spleen: Unremarkable. Adrenals/Urinary Tract: The adrenals are unremarkable. The kidneys are unremarkable, with no focal lesion, stone, hydronephrosis, or hydroureter. There is symmetric excretion of contrast into the collecting systems on the delayed images. The bladder is unremarkable. Stomach/Bowel: The stomach is unremarkable. There is a large stool burden in  the rectum with moderate stool throughout the remainder of the colon. There are no findings to suggest definite stercoral colitis. There is colonic diverticulosis without evidence of acute diverticulitis. Appendix is not definitively identified. Vascular/Lymphatic: There is scattered calcified atherosclerotic plaque throughout the nonaneurysmal abdominal aorta. The major branch vessels are patent. The main portal and splenic veins are patent. Reproductive: Prostate is markedly enlarged and impresses upon the inferior aspect of the bladder. Other: There is trace free fluid in the presacral space. There is no evidence of organized or drainable fluid collection. There is no free intraperitoneal air. Musculoskeletal: Mildly displaced fractures of the right eighth through eleventh ribs are unchanged, with 2 separate fractures of the ninth through eleventh ribs identified. There is no new acute osseous abnormality. There is no aggressive osseous lesion. IMPRESSION: 1. Large stool burden in the rectum with moderate stool throughout the remainder of the colon without findings to suggest definite stercoral colitis. 2. Bilateral pleural effusions with adjacent atelectasis, slightly decreased in size since the prior CT from 04/24/2021. 3. Colonic diverticulosis without evidence of acute diverticulitis. 4. Trace free fluid in the presacral space, nonspecific. 5. Unchanged mildly displaced fractures of the right eighth through eleventh ribs. 6. Marked prostatomegaly. 7. Cardiomegaly. Aortic Atherosclerosis (ICD10-I70.0). Electronically Signed   By: Valetta Mole  M.D.   On: 06/11/2021 11:58   DG Chest Port 1 View  Result Date: 06/25/2021 CLINICAL DATA:  88 year old male with history of shortness of breath. Evaluate for pneumonia. EXAM: PORTABLE CHEST 1 VIEW COMPARISON:  Chest x-ray 06/23/2021. FINDINGS: Significantly worsening aeration throughout the lungs bilaterally, with increasing hazy opacities throughout the right mid to  lower lung and in the left lung base, concerning for worsening multilobar bilateral pneumonia. Moderate right and small left pleural effusions. No pneumothorax. No evidence of pulmonary edema. Heart size is upper limits of normal. Upper mediastinal contours are within normal limits. IMPRESSION: 1. The appearance the chest is concerning for worsening multilobar bilateral pneumonia, as above. 2. Moderate right and small left parapneumonic pleural effusions. Electronically Signed   By: Vinnie Langton M.D.   On: 06/25/2021 07:18   DG Chest Portable 1 View  Result Date: 06/11/2021 CLINICAL DATA:  Pts family states patient has had constipation since December, lbm 3 days ago. Pt states it has involved his urination as well,to the point he has hard time voiding. Pt also then states he has been short of breath for past few days. Pt 's baseline since December has been short of breath, difficult to ascertain from family if has worsened . EXAM: PORTABLE CHEST 1 VIEW COMPARISON:  05/18/2021. FINDINGS: Cardiac silhouette borderline enlarged. No mediastinal or hilar masses. Lungs are hyperexpanded. Interstitial thickening and peribronchovascular opacities are noted in the lung bases increased when compared to the most recent prior exam. Interstitial prominence throughout the remainder of the lungs is stable. Small effusion suspected.  No pneumothorax. Skeletal structures grossly intact. IMPRESSION: 1. Lung base opacities consistent with atelectasis or pneumonia, somewhat greater on the left, increased compared to the most recent prior chest radiograph. 2. Lung hyperexpansion and chronic interstitial thickening suggesting underlying COPD. Electronically Signed   By: Lajean Manes M.D.   On: 06/11/2021 11:41   US Abdomen Limited RUQ (LIVER/GB)  Result Date: 06/23/2021 CLINICAL DATA:  Evaluation for elevated liver enzymes. EXAM: ULTRASOUND ABDOMEN LIMITED RIGHT UPPER QUADRANT COMPARISON:  The chest CT without contrast  earlier today, in the abdomen and pelvis CT with contrast 06/11/2021. FINDINGS: Gallbladder: No gallstones or wall thickening visualized. No sonographic Murphy sign noted by sonographer. Trace pericholecystic fluid. Common bile duct: Diameter: 1.6 mm.  No intrahepatic biliary prominence is seen. Liver: No focal lesion identified. There is hyperechoic parenchyma in the left lobe along side the falciform ligament. This is consistent with the focal hepatic fat deposition noted on the CT abdomen and pelvis. There is mild increased hepatic echogenicity of steatosis. Portal vein is patent on color Doppler imaging with normal direction of blood flow towards the liver. Other: A moderate to large right pleural effusion is again noted. There is a small amount of perihepatic ascites which was also seen earlier today. IMPRESSION: 1. Mild increased hepatic echogenicity of steatosis. 2. Focal fat in the left lobe along the falciform ligament, on ultrasound appearing as focal hyperechoic parenchyma. 3. Trace pericholecystic and minimal perihepatic fluid, but no findings of acute cholecystitis or cholelithiasis. 4. Moderate-to-large right pleural effusion. Electronically Signed   By: Telford Nab M.D.   On: 06/23/2021 23:22    Microbiology: Results for orders placed or performed during the hospital encounter of 06/23/21  Resp Panel by RT-PCR (Flu A&B, Covid) Nasopharyngeal Swab     Status: None   Collection Time: 06/23/21 11:42 AM   Specimen: Nasopharyngeal Swab; Nasopharyngeal(NP) swabs in vial transport medium  Result Value Ref Range Status  SARS Coronavirus 2 by RT PCR NEGATIVE NEGATIVE Final    Comment: (NOTE) SARS-CoV-2 target nucleic acids are NOT DETECTED.  The SARS-CoV-2 RNA is generally detectable in upper respiratory specimens during the acute phase of infection. The lowest concentration of SARS-CoV-2 viral copies this assay can detect is 138 copies/mL. A negative result does not preclude  SARS-Cov-2 infection and should not be used as the sole basis for treatment or other patient management decisions. A negative result may occur with  improper specimen collection/handling, submission of specimen other than nasopharyngeal swab, presence of viral mutation(s) within the areas targeted by this assay, and inadequate number of viral copies(<138 copies/mL). A negative result must be combined with clinical observations, patient history, and epidemiological information. The expected result is Negative.  Fact Sheet for Patients:  EntrepreneurPulse.com.au  Fact Sheet for Healthcare Providers:  IncredibleEmployment.be  This test is no t yet approved or cleared by the [place] FDA and  has been authorized for detection and/or diagnosis of SARS-CoV-2 by FDA under an Emergency Use Authorization (EUA). This EUA will remain  in effect (meaning this test can be used) for the duration of the COVID-19 declaration under Section 564(b)(1) of the Act, 21 U.S.C.section 360bbb-3(b)(1), unless the authorization is terminated  or revoked sooner.       Influenza A by PCR NEGATIVE NEGATIVE Final   Influenza B by PCR NEGATIVE NEGATIVE Final    Comment: (NOTE) The Xpert Xpress SARS-CoV-2/FLU/RSV plus assay is intended as an aid in the diagnosis of influenza from Nasopharyngeal swab specimens and should not be used as a sole basis for treatment. Nasal washings and aspirates are unacceptable for Xpert Xpress SARS-CoV-2/FLU/RSV testing.  Fact Sheet for Patients: EntrepreneurPulse.com.au  Fact Sheet for Healthcare Providers: IncredibleEmployment.be  This test is not yet approved or cleared by the [place] FDA and has been authorized for detection and/or diagnosis of SARS-CoV-2 by FDA under an Emergency Use Authorization (EUA). This EUA will remain in effect (meaning this test can be used) for the duration of  the COVID-19 declaration under Section 564(b)(1) of the Act, 21 U.S.C. section 360bbb-3(b)(1), unless the authorization is terminated or revoked.  Performed at [facility], [address], [place], [place] [address]     Labs: CBC: Recent Labs  Lab 06/23/21 1048 06/24/21 0409  WBC 8.3 8.1  NEUTROABS 5.5  --   HGB 13.2 12.2*  HCT 39.5 35.2*  MCV 91.6 91.0  PLT 98* 568*   Basic Metabolic Panel: Recent Labs  Lab 06/23/21 1048 06/24/21 0409 06/25/21 0311  NA 125* 128* 129*  K 4.7 3.5 3.8  CL 93* 95* 93*  CO2 21* 21* 25  GLUCOSE 177* 170* 149*  BUN 27* 22 23  CREATININE 1.33* 1.34* 1.35*  CALCIUM 9.3 8.6* 8.6*   Liver Function Tests: Recent Labs  Lab 06/23/21 1048 06/24/21 0409  AST 165* 133*  ALT 189* 157*  ALKPHOS 150* 130*  BILITOT 1.0 0.9  PROT 6.9 5.6*  ALBUMIN 4.0 3.0*   CBG: Recent Labs  Lab 06/24/21 1142 06/24/21 1627 06/24/21 2058 06/25/21 0540 06/25/21 1202  GLUCAP 165* 149* 173* 152* 170*    Discharge time spent: greater than 30 minutes.  Signed: Tawni Millers, MD [facility] 06/25/2021

## 2021-06-25 NOTE — Progress Notes (Signed)
Physical Therapy Treatment Patient Details Name: Adrian Olson MRN: [id number] DOB: Apr 23, 1931 Today's Date: 06/25/2021   History of Present Illness The pt is an 88 yo male presenting 2/9 with SOB, AMS, fatigue, falls, and lethargy. Upon work up, pt found to have CHF exacerbation with some pleural effusions/edema. PMH includes: severe MR status post clipping 2021, HFpEF,recurrent pleural effusion, HTN, HLD, IIDM, chronic hyponatremia, hypothyroidism, BPH.    PT Comments    Pt is making good, steady progress with mobility, only requiring min guard assist for safety with all functional mobility using a RW today. Pt continues to display deficits in activity tolerance and balance that place him at risk for falls though. Educated pt and family on his risk for falls, recommendation to use RW and have daughter stay with him initially at d/c, limit sodium and processed foods, weigh self daily, fluid restrictions, and increasing frequency of activity. They verbalized understanding. Will continue to follow acutely. Current recommendations remain appropriate.    Recommendations for follow up therapy are one component of a multi-disciplinary discharge planning process, led by the attending physician.  Recommendations may be updated based on patient status, additional functional criteria and insurance authorization.  Follow Up Recommendations  Home health PT     Assistance Recommended at Discharge Frequent or constant Supervision/Assistance  Patient can return home with the following A little help with walking and/or transfers;A little help with bathing/dressing/bathroom;Direct supervision/assist for medications management;Direct supervision/assist for financial management;Assist for transportation;Help with stairs or ramp for entrance   Equipment Recommendations  Rolling walker (2 wheels)    Recommendations for Other Services       Precautions / Restrictions Precautions Precautions:  Fall Precaution Comments: watch SpO2 Restrictions Weight Bearing Restrictions: No     Mobility  Bed Mobility Overal bed mobility: Needs Assistance Bed Mobility: Supine to Sit     Supine to sit: Min guard, HOB elevated     General bed mobility comments: Extra time with pt using bed rails, HOB elevated, min guard for safety.    Transfers Overall transfer level: Needs assistance Equipment used: Rolling walker (2 wheels) Transfers: Sit to/from Stand Sit to Stand: Min guard           General transfer comment: Min guard for safety, cues provided to push up from sitting surface and reach back for sitting surface rather than keeping hands on RW for transfers sit <> stand. Pt verbalized understanding, but needs further training to ensure compliance.    Ambulation/Gait Ambulation/Gait assistance: Min guard Gait Distance (Feet): 275 Feet (x1 standing rest breaks) Assistive device: Rolling walker (2 wheels) Gait Pattern/deviations: Decreased stride length, Shuffle, Trunk flexed, Step-through pattern Gait velocity: decreased Gait velocity interpretation: <1.31 ft/sec, indicative of household ambulator   General Gait Details: Pt with short, shuffling steps and flexed posture. No LOB, but pt slows speed and displays some trunk sway when cued to look L <> R or up <> down. Pt requires > 3 sec to turn 180' and stop. x1 standing rest break due to DOE 3/4.   Stairs             Wheelchair Mobility    Modified Rankin (Stroke Patients Only)       Balance Overall balance assessment: Needs assistance Sitting-balance support: No upper extremity supported, Feet supported Sitting balance-Leahy Scale: Good     Standing balance support: Bilateral upper extremity supported, During functional activity Standing balance-Leahy Scale: Poor Standing balance comment: Reliant on RW  Cognition Arousal/Alertness: Awake/alert Behavior During Therapy:  WFL for tasks assessed/performed Overall Cognitive Status: Within Functional Limits for tasks assessed                                          Exercises      General Comments General comments (skin integrity, edema, etc.): Educated and encouraged pt and family to have pt use RW at all times at d/c and to have daughter stay with them to supervise pt first intial days for safety due to risk for falls. Educated them on weighing self daily, fluid restriction, frequent activity, and limiting sodium and processed foods. They verbalized understanding with no further questions.      Pertinent Vitals/Pain Pain Assessment Pain Assessment: No/denies pain    Home Living                          Prior Function            PT Goals (current goals can now be found in the care plan section) Acute Rehab PT Goals Patient Stated Goal: to go home today PT Goal Formulation: With patient Time For Goal Achievement: 07/08/21 Potential to Achieve Goals: Good Progress towards PT goals: Progressing toward goals    Frequency    Min 3X/week      PT Plan Current plan remains appropriate    Co-evaluation              AM-PAC PT "6 Clicks" Mobility   Outcome Measure  Help needed turning from your back to your side while in a flat bed without using bedrails?: None Help needed moving from lying on your back to sitting on the side of a flat bed without using bedrails?: A Little Help needed moving to and from a bed to a chair (including a wheelchair)?: A Little Help needed standing up from a chair using your arms (e.g., wheelchair or bedside chair)?: A Little Help needed to walk in hospital room?: A Little Help needed climbing 3-5 steps with a railing? : A Little 6 Click Score: 19    End of Session Equipment Utilized During Treatment: Gait belt Activity Tolerance: Patient tolerated treatment well Patient left: in chair;with call bell/phone within reach;with chair  alarm set;with family/visitor present   PT Visit Diagnosis: Other abnormalities of gait and mobility (R26.89);Repeated falls (R29.6);Unsteadiness on feet (R26.81);Difficulty in walking, not elsewhere classified (R26.2);Muscle weakness (generalized) (M62.81)     Time: 3710-6269 PT Time Calculation (min) (ACUTE ONLY): 16 min  Charges:  $Therapeutic Activity: 8-22 mins                     Moishe Spice, PT, DPT Acute Rehabilitation Services  Pager: [phone] Office: [place] 06/25/2021, 2:17 PM

## 2021-06-27 ENCOUNTER — Ambulatory Visit: Payer: Medicare HMO | Admitting: Pulmonary Disease

## 2021-06-27 DIAGNOSIS — S272XXD Traumatic hemopneumothorax, subsequent encounter: Secondary | ICD-10-CM | POA: Diagnosis not present

## 2021-06-27 DIAGNOSIS — I130 Hypertensive heart and chronic kidney disease with heart failure and stage 1 through stage 4 chronic kidney disease, or unspecified chronic kidney disease: Secondary | ICD-10-CM | POA: Diagnosis not present

## 2021-06-27 DIAGNOSIS — R69 Illness, unspecified: Secondary | ICD-10-CM | POA: Diagnosis not present

## 2021-06-27 DIAGNOSIS — I5033 Acute on chronic diastolic (congestive) heart failure: Secondary | ICD-10-CM | POA: Diagnosis not present

## 2021-06-27 DIAGNOSIS — E039 Hypothyroidism, unspecified: Secondary | ICD-10-CM | POA: Diagnosis not present

## 2021-06-27 DIAGNOSIS — S2241XD Multiple fractures of ribs, right side, subsequent encounter for fracture with routine healing: Secondary | ICD-10-CM | POA: Diagnosis not present

## 2021-06-27 DIAGNOSIS — E785 Hyperlipidemia, unspecified: Secondary | ICD-10-CM | POA: Diagnosis not present

## 2021-06-27 DIAGNOSIS — E1122 Type 2 diabetes mellitus with diabetic chronic kidney disease: Secondary | ICD-10-CM | POA: Diagnosis not present

## 2021-06-27 DIAGNOSIS — N1831 Chronic kidney disease, stage 3a: Secondary | ICD-10-CM | POA: Diagnosis not present

## 2021-06-29 ENCOUNTER — Other Ambulatory Visit: Payer: Self-pay

## 2021-06-29 ENCOUNTER — Other Ambulatory Visit: Payer: Medicare HMO | Admitting: *Deleted

## 2021-06-29 DIAGNOSIS — E785 Hyperlipidemia, unspecified: Secondary | ICD-10-CM

## 2021-06-29 DIAGNOSIS — I10 Essential (primary) hypertension: Secondary | ICD-10-CM

## 2021-06-29 DIAGNOSIS — Z95818 Presence of other cardiac implants and grafts: Secondary | ICD-10-CM | POA: Diagnosis not present

## 2021-06-29 DIAGNOSIS — I4729 Other ventricular tachycardia: Secondary | ICD-10-CM | POA: Diagnosis not present

## 2021-06-29 DIAGNOSIS — I341 Nonrheumatic mitral (valve) prolapse: Secondary | ICD-10-CM | POA: Diagnosis not present

## 2021-06-29 DIAGNOSIS — J90 Pleural effusion, not elsewhere classified: Secondary | ICD-10-CM

## 2021-06-29 DIAGNOSIS — I5032 Chronic diastolic (congestive) heart failure: Secondary | ICD-10-CM

## 2021-06-29 DIAGNOSIS — I471 Supraventricular tachycardia: Secondary | ICD-10-CM | POA: Diagnosis not present

## 2021-06-29 DIAGNOSIS — Z9889 Other specified postprocedural states: Secondary | ICD-10-CM | POA: Diagnosis not present

## 2021-06-30 LAB — BASIC METABOLIC PANEL
BUN/Creatinine Ratio: 16 (ref 10–24)
BUN: 20 mg/dL (ref 8–27)
CO2: 25 mmol/L (ref 20–29)
Calcium: 9.2 mg/dL (ref 8.6–10.2)
Chloride: 94 mmol/L — ABNORMAL LOW (ref 96–106)
Creatinine, Ser: 1.24 mg/dL (ref 0.76–1.27)
Glucose: 150 mg/dL — ABNORMAL HIGH (ref 70–99)
Potassium: 4.7 mmol/L (ref 3.5–5.2)
Sodium: 134 mmol/L (ref 134–144)
eGFR: 56 mL/min/{1.73_m2} — ABNORMAL LOW (ref >59)

## 2021-07-01 ENCOUNTER — Telehealth: Payer: Self-pay | Admitting: Pulmonary Disease

## 2021-07-01 ENCOUNTER — Telehealth: Payer: Self-pay | Admitting: Nurse Practitioner

## 2021-07-01 NOTE — Telephone Encounter (Signed)
° °  Pts dtr called this evening to report that pt was recently admitted for CHF.  Prior to admission, he was rx lasix 40mg  daily, but never started it, b/c he was admitted the following day (2/9).  He was diuresed and then d/c'd on lasix 60 daily. D/c summary indicates that-that dose was chosen b/c he needed a higher dose than 40, which he wasn't actually taking.  Since d/c, his wt is down 4 lbs @ 126 lbs, from 130 lbs.  Both wts are lower than he has ever been dating back to 2015.  He now feels very weak.  No c/p or orthostasis.  Still w/ some dyspnea, though this is improved since hospitalization.  We discussed that ideally we would check labs to eval his renal function and electrolytes, however given that it's Friday evening, that won't be an option, and based on description, there is not a need for urgent or emergent eval @ this time.  I rec that he hold lasix in the AM and continue to hold if wt continues to trend down.  If wt stable or increased on Sunday or after, he should resume lasix 40 mg daily on w/ plan for f/u in [facility] clinic on Tuesday, at which time labs can be obtained.  Caller verbalized understanding and was grateful for the call back.  Murray Hodgkins, NP 07/01/2021, 6:26 PM

## 2021-07-01 NOTE — Telephone Encounter (Signed)
Called and spoke with patient's wife. She verbalized understanding. She will contact cardiology again for the Lasix.   Nothing further needed at time of call.

## 2021-07-01 NOTE — Telephone Encounter (Signed)
Patient should contact the cardiology office in regards to his lasix dosing. I would continue this regimen as directed until further details from cardiology. I understand he is urinating a lot, but this is very important to his treatment.  Thanks,  JD

## 2021-07-01 NOTE — Telephone Encounter (Signed)
Called and spoke with patient's wife. She stated that the patient was in the hospital last week and had his Lasix increased to 60mg  once daily. The patient is complaining about having to use the restroom several times a day and has lost 4 pounds since being home from the hospital. He is wanting to decrease the Lasix back to 40mg . Denied any leg cramping or weakness.   She was unsure of who to get in contact with. She is aware that Dr. Erin Fulling is not the one who prescribed the medication. She still wants his input. She had called cardiology but hasn't heard back from them.   Dr. Erin Fulling, can you please advise? Thanks!

## 2021-07-04 DIAGNOSIS — E1122 Type 2 diabetes mellitus with diabetic chronic kidney disease: Secondary | ICD-10-CM | POA: Diagnosis not present

## 2021-07-04 DIAGNOSIS — I130 Hypertensive heart and chronic kidney disease with heart failure and stage 1 through stage 4 chronic kidney disease, or unspecified chronic kidney disease: Secondary | ICD-10-CM | POA: Diagnosis not present

## 2021-07-04 DIAGNOSIS — N1831 Chronic kidney disease, stage 3a: Secondary | ICD-10-CM | POA: Diagnosis not present

## 2021-07-04 DIAGNOSIS — S272XXD Traumatic hemopneumothorax, subsequent encounter: Secondary | ICD-10-CM | POA: Diagnosis not present

## 2021-07-04 DIAGNOSIS — E785 Hyperlipidemia, unspecified: Secondary | ICD-10-CM | POA: Diagnosis not present

## 2021-07-04 DIAGNOSIS — S2241XD Multiple fractures of ribs, right side, subsequent encounter for fracture with routine healing: Secondary | ICD-10-CM | POA: Diagnosis not present

## 2021-07-04 DIAGNOSIS — I5033 Acute on chronic diastolic (congestive) heart failure: Secondary | ICD-10-CM | POA: Diagnosis not present

## 2021-07-04 DIAGNOSIS — E039 Hypothyroidism, unspecified: Secondary | ICD-10-CM | POA: Diagnosis not present

## 2021-07-04 DIAGNOSIS — R69 Illness, unspecified: Secondary | ICD-10-CM | POA: Diagnosis not present

## 2021-07-04 NOTE — Progress Notes (Signed)
HEART & VASCULAR TRANSITION OF CARE CONSULT NOTE     Referring Physician: Dr. Cathlean Sauer, Internal Medicine  Primary Care: Merrilee Seashore, MD Primary Cardiologist: [name], MD   HPI: Referred to clinic by Dr. Cathlean Sauer for heart failure consultation.   88 y/o male w/ hx of degenerative mitral valve disease w/ subsequent severe mitral regurgitation s/p MitraClip 06/11/76, chronic diastolic heart failure, mod nonobstuctive CAD by cath in 2021, recurrent pleural effusions requiring thoracentesis, hypertension, hyperlipidemia and bilateral carotid artery disease, recently admitted for a/c diastolic heart failure. CXR w/ moderate rt and small left parapneumonic pleural effusions. Admitted by [facility]ad. Cardiology not consulted. He was diuresed w/ IV Lasix then transitioned back to PO at 60 mg daily (increased from 40 mg daily). D/c Wt 130 lb. Referred to TOC at discharge.   Of note, echo was not repeated. Last 2D echo was 12/22. EF 55-60%, MitraClip stable, moderate MR. RV normal.   Presents to clinic today w/ his wife and daughter. Reports that post d/c, he continued to drop wt, down to 121 lb and felt weak. He called the on-call cardiology provider over the weekend and was advised to hold lasix until today's f/u but instructed to resume if wt trended back up too quickly. Wt at home today was 127 lb and 131 lb on our clinic scale. Volume status good today. ReDs clip 27%.  No peripheral edema. Denies CP. Weakness resolved. Eating but appetite is not back to 100%. Still w/ NYHA Class II symptoms. Has f/u w/ pulmonology next week and Dr. Tamala Julian next month.     Cardiac Testing  12/22  1. Left ventricular ejection fraction, by estimation, is 55 to 60%. Left  ventricular ejection fraction by 3D volume is 56 %. The left ventricle has  normal function. The left ventricle has no regional wall motion  abnormalities. There is mild concentric  left ventricular hypertrophy. Left ventricular  diastolic function could  not be evaluated. Elevated left atrial pressure.   2. Right ventricular systolic function is normal. The right ventricular  size is normal. Tricuspid regurgitation signal is inadequate for assessing  PA pressure.   3. Left atrial size was moderately dilated.   4. Right atrial size was moderately dilated.   5. Moderate pleural effusion in the left lateral region.   6. The mitral valve has been repaired/replaced. Moderate mitral valve  regurgitation. The mean mitral valve gradient is 4.0 mmHg with average  heart rate of 58 bpm. There is a Mitra-Clip present in the mitral  position. Procedure Date: 10/09/2019.   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   8. The aortic valve is tricuspid. There is moderate calcification of the  aortic valve. There is moderate thickening of the aortic valve. Aortic  valve regurgitation is mild.   9. The inferior vena cava is dilated in size with <50% respiratory  variability, suggesting right atrial pressure of 15 mmHg.   Comparison(s): No significant change from prior study. Prior images  reviewed side by side.   Review of Systems: [y] = yes, [ ]  = no   General: Weight gain [ ] ; Weight loss [ ] ; Anorexia [ ] ; Fatigue [ ] ; Fever [ ] ; Chills [ ] ; Weakness [ ]   Cardiac: Chest pain/pressure [ ] ; Resting SOB [ ] ; Exertional SOB [ ] ; Orthopnea [ ] ; Pedal Edema [ ] ; Palpitations [ ] ; Syncope [ ] ; Presyncope [ ] ; Paroxysmal nocturnal dyspnea[ ]   Pulmonary: Cough [ ] ; Wheezing[ ] ; Hemoptysis[ ] ; Sputum [ ] ;  Snoring [ ]   GI: Vomiting[ ] ; Dysphagia[ ] ; Melena[ ] ; Hematochezia [ ] ; Heartburn[ ] ; Abdominal pain [ ] ; Constipation [ ] ; Diarrhea [ ] ; BRBPR [ ]   GU: Hematuria[ ] ; Dysuria [ ] ; Nocturia[ ]   Vascular: Pain in legs with walking [ ] ; Pain in feet with lying flat [ ] ; Non-healing sores [ ] ; Stroke [ ] ; TIA [ ] ; Slurred speech [ ] ;  Neuro: Headaches[ ] ; Vertigo[ ] ; Seizures[ ] ; Paresthesias[ ] ;Blurred vision [ ] ; Diplopia [ ] ; Vision changes  [ ]   Ortho/Skin: Arthritis [ ] ; Joint pain [ ] ; Muscle pain [ ] ; Joint swelling [ ] ; Back Pain [ ] ; Rash [ ]   Psych: Depression[ ] ; Anxiety[ ]   Heme: Bleeding problems [ ] ; Clotting disorders [ ] ; Anemia [ ]   Endocrine: Diabetes [ ] ; Thyroid dysfunction[ ]    Past Medical History:  Diagnosis Date   Bilateral carotid artery disease (HCC)    Severe Left and moderate Right   Diabetes mellitus without complication (HCC)    ED (erectile dysfunction)    Hyperlipidemia    Hypertension    Lumbar disc disease    Mitral valve prolapse    moderate to severe MR, LVEF 55% Echo 2010   Moderate mitral regurgitation 03/16/2014   S/P mitral valve clip implantation 10/09/2019   s/p TEER using 2 MitraClip XTW devices, first device positioned A2 P2, second device positioned A1 P1 by Dr. Burt Knack    Current Outpatient Medications  Medication Sig Dispense Refill   albuterol (VENTOLIN HFA) 108 (90 Base) MCG/ACT inhaler Inhale 2 puffs into the lungs every 6 (six) hours as needed for wheezing or shortness of breath. 8 g 2   aspirin 81 MG EC tablet Take 81 mg by mouth daily.     cetirizine (ZYRTEC ALLERGY) 10 MG tablet Take 1 tablet (10 mg total) by mouth daily. 30 tablet 3   Cholecalciferol (VITAMIN D) 2000 UNITS tablet Take 2,000 Units by mouth daily at 12 noon.      Cyanocobalamin (VITAMIN B-12) 2000 MCG TBCR 1 tablet     feeding supplement (ENSURE ENLIVE / ENSURE PLUS) LIQD Take 237 mLs by mouth 2 (two) times daily between meals. 14220 mL 0   finasteride (PROSCAR) 5 MG tablet Take 5 mg by mouth daily at 12 noon.      fluticasone (FLONASE) 50 MCG/ACT nasal spray Place 2 sprays into both nostrils daily. 18.2 mL 2   levothyroxine (SYNTHROID, LEVOTHROID) 75 MCG tablet Take 75 mcg by mouth daily before breakfast.     metFORMIN (GLUCOPHAGE) 500 MG tablet Take 500 mg by mouth every evening.      metoprolol tartrate (LOPRESSOR) 25 MG tablet Take 0.5 tablets (12.5 mg total) by mouth 2 (two) times daily. 90 tablet 3    Multiple Vitamin (MULTIVITAMIN WITH MINERALS) TABS tablet Take 1 tablet by mouth daily. 30 tablet 0   pravastatin (PRAVACHOL) 20 MG tablet Take 20 mg by mouth at bedtime.      ramipril (ALTACE) 5 MG capsule Take 5 mg by mouth daily.      sodium chloride (OCEAN) 0.65 % SOLN nasal spray Place 1 spray into both nostrils daily as needed for congestion.     tamsulosin (FLOMAX) 0.4 MG CAPS capsule Take 0.4 mg by mouth daily at 12 noon.      Thiamine HCl (VITAMIN B1 PO) Take 125 mcg by mouth daily.     venlafaxine XR (EFFEXOR-XR) 37.5 MG 24 hr capsule Take 1 capsule (37.5 mg total) by mouth  daily with breakfast.     furosemide (LASIX) 40 MG tablet Take 1 tablet (40 mg total) by mouth as needed. As needed for weight greater than 133lb. Take 1 potassium tablet when you take this 30 tablet 0   potassium chloride SA (KLOR-CON M) 20 MEQ tablet Take 0.5 tablets (10 mEq total) by mouth as needed. Only take 1 tablet when you take your Furosemide 90 tablet 0   No current facility-administered medications for this encounter.    Allergies  Allergen Reactions   Duloxetine Hcl Other (See Comments)    withdrawal      Social History   Socioeconomic History   Marital status: Married    Spouse name: Not on file   Number of children: Not on file   Years of education: Not on file   Highest education level: Not on file  Occupational History   Not on file  Tobacco Use   Smoking status: Never   Smokeless tobacco: Never  Vaping Use   Vaping Use: Never used  Substance and Sexual Activity   Alcohol use: No    Alcohol/week: 0.0 standard drinks   Drug use: No   Sexual activity: Not on file  Other Topics Concern   Not on file  Social History Narrative   Not on file   Social Determinants of Health   Financial Resource Strain: Not on file  Food Insecurity: Not on file  Transportation Needs: Not on file  Physical Activity: Not on file  Stress: Not on file  Social Connections: Not on file  Intimate  Partner Violence: Not on file      Family History  Problem Relation Age of Onset   Cancer Mother        ovarian   Diabetes Father    Other Father        enlarged prostate   Stroke Father    Heart disease Father    Heart disease Sister    Obesity Sister    Dementia Sister    Diabetes Sister    Other Sister        nornmal old age problems    Vitals:   07/05/21 1010  BP: 121/70  Pulse: 62  SpO2: 96%  Weight: 59.4 kg (131 lb)    PHYSICAL EXAM: ReDs Clip 27%  General:  Well appearing. No respiratory difficulty HEENT: normal Neck: supple. no JVD. Carotids 2+ bilat; no bruits. No lymphadenopathy or thryomegaly appreciated. Cor: PMI nondisplaced. Regular rate & rhythm. No rubs, gallops or murmurs. Lungs: clear Abdomen: soft, nontender, nondistended. No hepatosplenomegaly. No bruits or masses. Good bowel sounds. Extremities: no cyanosis, clubbing, rash, edema Neuro: alert & oriented x 3, cranial nerves grossly intact. moves all 4 extremities w/o difficulty. Affect pleasant.  ECG: not performed    ASSESSMENT & PLAN:  Chronic Diastolic Heart Failure -most recent echo 12/22 EF 55-60%. RV normal  -recent admit for a/c CHF and diuresed down to dry wt of 130 lb -Did not tolerate daily lasix dosing post hospital due to hypovolemia/ orthostasis. Has been off lasix for the last 4 days -Evuolemic on exam. ReDs Clip 27%. Feeling better today. Weakness resolved. PO intake not back to 100% - recommend 40 mg PRN lasix for wt gain >133 lb w/ 20 mEq of KCl  - discussed importance of daily wts  - no SGLT2i given age and risk for volume depletion/falls  - BP well controlled, continue metoprolol and ramipril  - Despite euvolemic status, he continues w/ NYHA Class  II symptoms. Suspect multifactorial, 2/2 residual mod MR post mitral clip, advanced age + possible pulmonary component. He has f/u w/ pulmonology next week. He also had moderate, nonobstructive, CAD involving the mLAD by cath in  2021, w/ 60-70% stenosis but not hemodynamically significant. This has been treated medically. ? Possible disease progression contributing to symptoms. He denies CP. Will defer further management to Dr. Tamala Julian   2. Mitral Regurgitation  - s/p transcatheter edge to edge mitral valve repair using 2 MitraClip XTW devices 09/2019, first device positioned A2 P2, second device positioned A1 P1, w/ initial reduction in mitral regurgitation from 4+ to 1+. - he has had evidence of progressive MR on serial echos post MitraClip, Mod-severe on echo 7/21. Mod MR on echo 12/22 - given presence of 2 clips, doubt amendable to further intervention but will d/w structural team   3. Hypertension - controlled on current regimen - BMP today   4. CAD  - 60-70% mLAD stenosis on cath in 2021, treated medically  - no chest pain but c/w exertional dyspnea, despite euvolemic status  - ? Possible disease progression contributing to symptoms. Defer further management to Dr. Tamala Julian. Has f/u next month  - continue ASA, statin + ? blocker   HFpEF  NYHA II GDMT  Diuretic- Lasix 40 mg PRN  BB- Lopressor 12.5 mg bid  Ace/ARB/ARNI Ramipril 5 mg daily  MRA no  SGLT2i no    Referred to HFSW (PCP, Medications, Transportation, ETOH Abuse, Drug Abuse, Insurance, Financial ): No Refer to Pharmacy: No Refer to Home Health: No Refer to Advanced Heart Failure Clinic: No  Refer to General Cardiology: Yes (back to Dr. Tamala Julian)   Follow up: F/u w/ Dr. Tamala Julian in 4 weeks

## 2021-07-05 ENCOUNTER — Other Ambulatory Visit: Payer: Self-pay

## 2021-07-05 ENCOUNTER — Ambulatory Visit (HOSPITAL_COMMUNITY)
Admit: 2021-07-05 | Discharge: 2021-07-05 | Disposition: A | Discharge status: Home / Self Care | Payer: Medicare HMO | Attending: Cardiology | Admitting: Cardiology

## 2021-07-05 ENCOUNTER — Encounter (HOSPITAL_COMMUNITY): Payer: Self-pay

## 2021-07-05 VITALS — BP 121/70 | HR 62 | Wt 131.0 lb

## 2021-07-05 DIAGNOSIS — Z7982 Long term (current) use of aspirin: Secondary | ICD-10-CM | POA: Insufficient documentation

## 2021-07-05 DIAGNOSIS — S2241XD Multiple fractures of ribs, right side, subsequent encounter for fracture with routine healing: Secondary | ICD-10-CM | POA: Diagnosis not present

## 2021-07-05 DIAGNOSIS — I130 Hypertensive heart and chronic kidney disease with heart failure and stage 1 through stage 4 chronic kidney disease, or unspecified chronic kidney disease: Secondary | ICD-10-CM | POA: Diagnosis not present

## 2021-07-05 DIAGNOSIS — I2510 Atherosclerotic heart disease of native coronary artery without angina pectoris: Secondary | ICD-10-CM | POA: Insufficient documentation

## 2021-07-05 DIAGNOSIS — E861 Hypovolemia: Secondary | ICD-10-CM | POA: Diagnosis not present

## 2021-07-05 DIAGNOSIS — I083 Combined rheumatic disorders of mitral, aortic and tricuspid valves: Secondary | ICD-10-CM | POA: Insufficient documentation

## 2021-07-05 DIAGNOSIS — S272XXD Traumatic hemopneumothorax, subsequent encounter: Secondary | ICD-10-CM | POA: Diagnosis not present

## 2021-07-05 DIAGNOSIS — I110 Hypertensive heart disease with heart failure: Secondary | ICD-10-CM | POA: Insufficient documentation

## 2021-07-05 DIAGNOSIS — I5032 Chronic diastolic (congestive) heart failure: Secondary | ICD-10-CM | POA: Insufficient documentation

## 2021-07-05 DIAGNOSIS — Z79899 Other long term (current) drug therapy: Secondary | ICD-10-CM | POA: Diagnosis not present

## 2021-07-05 DIAGNOSIS — I5033 Acute on chronic diastolic (congestive) heart failure: Secondary | ICD-10-CM | POA: Diagnosis not present

## 2021-07-05 LAB — BASIC METABOLIC PANEL
Anion gap: 7 (ref 5–15)
BUN: 12 mg/dL (ref 8–23)
CO2: 27 mmol/L (ref 22–32)
Calcium: 9.4 mg/dL (ref 8.9–10.3)
Chloride: 97 mmol/L — ABNORMAL LOW (ref 98–111)
Creatinine, Ser: 1.15 mg/dL (ref 0.61–1.24)
GFR, Estimated: >60 mL/min (ref >60)
Glucose, Bld: 214 mg/dL — ABNORMAL HIGH (ref 70–99)
Potassium: 4.7 mmol/L (ref 3.5–5.1)
Sodium: 131 mmol/L — ABNORMAL LOW (ref 135–145)

## 2021-07-05 MED ORDER — POTASSIUM CHLORIDE CRYS ER 20 MEQ PO TBCR: 10.0000 meq | EXTENDED_RELEASE_TABLET | ORAL | 0 refills | Status: DC | PRN

## 2021-07-05 MED ORDER — FUROSEMIDE 40 MG PO TABS: 40.0000 mg | ORAL_TABLET | ORAL | 0 refills | Status: DC | PRN

## 2021-07-05 NOTE — Patient Instructions (Addendum)
Thank you for your visit today.  Change your Furosemide to 40mg  daily AS NEEDED for weight greater than 133lb.  Change your potassium to 20 Meq daily AS NEEDED. Only take your potassium when you take your Furosemide.  Labs done today, your results will be available in MyChart, we will contact you for abnormal readings.   Thank you for allowing [place] to provider your heart failure care after your recent hospitalization. Please follow-up with Dr. Tamala Julian as Scheduled

## 2021-07-05 NOTE — Progress Notes (Signed)
ReDS Vest / Clip - 07/05/21 1000       ReDS Vest / Clip   Station Marker C    Ruler Value 28    ReDS Value Range Low volume    ReDS Actual Value 27

## 2021-07-07 DIAGNOSIS — R69 Illness, unspecified: Secondary | ICD-10-CM | POA: Diagnosis not present

## 2021-07-07 DIAGNOSIS — E782 Mixed hyperlipidemia: Secondary | ICD-10-CM | POA: Diagnosis not present

## 2021-07-07 DIAGNOSIS — N1831 Chronic kidney disease, stage 3a: Secondary | ICD-10-CM | POA: Diagnosis not present

## 2021-07-07 DIAGNOSIS — I5032 Chronic diastolic (congestive) heart failure: Secondary | ICD-10-CM | POA: Diagnosis not present

## 2021-07-07 DIAGNOSIS — D692 Other nonthrombocytopenic purpura: Secondary | ICD-10-CM | POA: Diagnosis not present

## 2021-07-12 ENCOUNTER — Ambulatory Visit: Payer: Medicare HMO | Admitting: Pulmonary Disease

## 2021-07-12 ENCOUNTER — Ambulatory Visit (INDEPENDENT_AMBULATORY_CARE_PROVIDER_SITE_OTHER): Payer: Medicare HMO

## 2021-07-12 ENCOUNTER — Other Ambulatory Visit: Payer: Self-pay

## 2021-07-12 ENCOUNTER — Encounter: Payer: Self-pay | Admitting: Pulmonary Disease

## 2021-07-12 VITALS — BP 116/72 | HR 66 | Ht 69.0 in | Wt 132.0 lb

## 2021-07-12 DIAGNOSIS — R0600 Dyspnea, unspecified: Secondary | ICD-10-CM

## 2021-07-12 DIAGNOSIS — R5383 Other fatigue: Secondary | ICD-10-CM | POA: Diagnosis not present

## 2021-07-12 DIAGNOSIS — J90 Pleural effusion, not elsewhere classified: Secondary | ICD-10-CM

## 2021-07-12 DIAGNOSIS — R0602 Shortness of breath: Secondary | ICD-10-CM

## 2021-07-12 DIAGNOSIS — I5032 Chronic diastolic (congestive) heart failure: Secondary | ICD-10-CM

## 2021-07-12 DIAGNOSIS — E43 Unspecified severe protein-calorie malnutrition: Secondary | ICD-10-CM | POA: Diagnosis not present

## 2021-07-12 MED ORDER — STIOLTO RESPIMAT 2.5-2.5 MCG/ACT IN AERS: 2.0000 | INHALATION_SPRAY | Freq: Every day | RESPIRATORY_TRACT | 0 refills | Status: DC

## 2021-07-12 NOTE — Progress Notes (Signed)
Patient seen in the office today and instructed on use of Stiolto.  Patient expressed understanding and demonstrated technique. ° °Sharaine Delange CMA 07/12/2021 °

## 2021-07-12 NOTE — Progress Notes (Addendum)
Synopsis: Referred in December 2022 for pleural effusion after hospitalization  Subjective:   PATIENT ID: Adrian Olson GENDER: male DOB: Nov 02, 1930, MRN: [id number]  HPI  Chief Complaint  Patient presents with   Hospitalization Follow-up    Was in the hospital a few weeks ago for CHF. States he has not been doing well since being home. Increased weakness, fatigue and SOB.    Adrian Olson is an 88 year old male, never smoker with history of mitral valve prolapse and mitral regurgitation with mitral valve clip, DM II, and hypertension who returns to pulmonary clinic for shortness of breath.   Patient was re-admitted to the hospital on 2/9 to 2/11 for heart failure exacerbation. He was diuresed well and sent home on daily lasix. He is now on PRN lasix after follow up with the heart failure team on 07/05/2021. He was having increased weakness while on the lasix which has improved. Overall, he feels he still has exertional dyspnea and significant fatigue. His weight has been stable and his appetite seems to be improving a bit.  He denies cough or wheezing. He is also complaining of throat congestion.  OV 05/03/21 Patient had thoracentesis performed on 04/25/2021 with concern for hemothorax due to his fall and multiple rib fractures on the right side.  Follow-up chest x-ray on 12/13 showed resolution of the pleural effusion with no reaccumulation of fluid.  Patient had an ambulatory SPO2 evaluation with no desaturations and requirement for supplemental oxygen.  He was discharged home on 12/13.  Since discharge patient was initially feeling okay at home but reports since yesterday he has noticed increasing shortness of breath and fatigue.  He denies any fevers, chills or cough.  Chest radiograph today shows return of the right pleural effusion and small left pleural effusion.  Past Medical History:  Diagnosis Date   Bilateral carotid artery disease (HCC)    Severe Left and moderate Right    Diabetes mellitus without complication [facility])    ED (erectile dysfunction)    Hyperlipidemia    Hypertension    Lumbar disc disease    Mitral valve prolapse    moderate to severe MR, LVEF 55% Echo 2010   Moderate mitral regurgitation 03/16/2014   S/P mitral valve clip implantation 10/09/2019   s/p TEER using 2 MitraClip XTW devices, first device positioned A2 P2, second device positioned A1 P1 by Dr. Burt Knack     Family History  Problem Relation Age of Onset   Cancer Mother        ovarian   Diabetes Father    Other Father        enlarged prostate   Stroke Father    Heart disease Father    Heart disease Sister    Obesity Sister    Dementia Sister    Diabetes Sister    Other Sister        nornmal old age problems     Social History   Socioeconomic History   Marital status: Married    Spouse name: Not on file   Number of children: Not on file   Years of education: Not on file   Highest education level: Not on file  Occupational History   Not on file  Tobacco Use   Smoking status: Never   Smokeless tobacco: Never  Vaping Use   Vaping Use: Never used  Substance and Sexual Activity   Alcohol use: No    Alcohol/week: 0.0 standard drinks   Drug use:  No   Sexual activity: Not on file  Other Topics Concern   Not on file  Social History Narrative   Not on file   Social Determinants of Health   Financial Resource Strain: Not on file  Food Insecurity: Not on file  Transportation Needs: Not on file  Physical Activity: Not on file  Stress: Not on file  Social Connections: Not on file  Intimate Partner Violence: Not on file     Allergies  Allergen Reactions   Duloxetine Hcl Other (See Comments)    withdrawal     Outpatient Medications Prior to Visit  Medication Sig Dispense Refill   albuterol (VENTOLIN HFA) 108 (90 Base) MCG/ACT inhaler Inhale 2 puffs into the lungs every 6 (six) hours as needed for wheezing or shortness of breath. 8 g 2   aspirin 81 MG EC  tablet Take 81 mg by mouth daily.     cetirizine (ZYRTEC ALLERGY) 10 MG tablet Take 1 tablet (10 mg total) by mouth daily. 30 tablet 3   Cholecalciferol (VITAMIN D) 2000 UNITS tablet Take 2,000 Units by mouth daily at 12 noon.      Cyanocobalamin (VITAMIN B-12) 2000 MCG TBCR 1 tablet     feeding supplement (ENSURE ENLIVE / ENSURE PLUS) LIQD Take 237 mLs by mouth 2 (two) times daily between meals. 14220 mL 0   finasteride (PROSCAR) 5 MG tablet Take 5 mg by mouth daily at 12 noon.      fluticasone (FLONASE) 50 MCG/ACT nasal spray Place 2 sprays into both nostrils daily. 18.2 mL 2   furosemide (LASIX) 40 MG tablet Take 1 tablet (40 mg total) by mouth as needed. As needed for weight greater than 133lb. Take 1 potassium tablet when you take this 30 tablet 0   levothyroxine (SYNTHROID, LEVOTHROID) 75 MCG tablet Take 75 mcg by mouth daily before breakfast.     metFORMIN (GLUCOPHAGE) 500 MG tablet Take 500 mg by mouth every evening.      Multiple Vitamin (MULTIVITAMIN WITH MINERALS) TABS tablet Take 1 tablet by mouth daily. 30 tablet 0   potassium chloride SA (KLOR-CON M) 20 MEQ tablet Take 0.5 tablets (10 mEq total) by mouth as needed. Only take 1 tablet when you take your Furosemide 90 tablet 0   pravastatin (PRAVACHOL) 20 MG tablet Take 20 mg by mouth at bedtime.      ramipril (ALTACE) 5 MG capsule Take 5 mg by mouth daily.      sodium chloride (OCEAN) 0.65 % SOLN nasal spray Place 1 spray into both nostrils daily as needed for congestion.     tamsulosin (FLOMAX) 0.4 MG CAPS capsule Take 0.4 mg by mouth daily at 12 noon.      Thiamine HCl (VITAMIN B1 PO) Take 125 mcg by mouth daily.     metoprolol tartrate (LOPRESSOR) 25 MG tablet Take 0.5 tablets (12.5 mg total) by mouth 2 (two) times daily. 90 tablet 3   venlafaxine XR (EFFEXOR-XR) 37.5 MG 24 hr capsule Take 1 capsule (37.5 mg total) by mouth daily with breakfast.     No facility-administered medications prior to visit.    Review of Systems   Constitutional:  Positive for malaise/fatigue. Negative for chills, fever and weight loss.  HENT:  Negative for congestion, sinus pain and sore throat.   Eyes: Negative.   Respiratory:  Positive for shortness of breath. Negative for cough, hemoptysis, sputum production and wheezing.   Cardiovascular:  Negative for chest pain, palpitations, orthopnea, claudication and leg swelling.  Gastrointestinal:  Negative for abdominal pain, heartburn, nausea and vomiting.  Genitourinary: Negative.   Musculoskeletal:  Negative for joint pain and myalgias.  Skin:  Negative for rash.  Neurological:  Negative for weakness.  Endo/Heme/Allergies: Negative.   Psychiatric/Behavioral: Negative.     Objective:   Vitals:   07/12/21 1402  BP: 116/72  Pulse: 66  SpO2: 96%  Weight: 132 lb (59.9 kg)  Height: 5\' 9"  (1.753 m)    Physical Exam Constitutional:      General: He is not in acute distress. HENT:     Head: Normocephalic and atraumatic.  Eyes:     Conjunctiva/sclera: Conjunctivae normal.  Cardiovascular:     Rate and Rhythm: Normal rate and regular rhythm.     Pulses: Normal pulses.     Heart sounds: Normal heart sounds. No murmur heard. Pulmonary:     Effort: No respiratory distress (Mild).     Breath sounds: Rales (bibasilar) present. No wheezing or rhonchi.  Musculoskeletal:     Right lower leg: No edema.     Left lower leg: No edema.  Skin:    General: Skin is warm and dry.  Neurological:     General: No focal deficit present.     Mental Status: He is alert.  Psychiatric:        Mood and Affect: Mood normal.        Behavior: Behavior normal.        Thought Content: Thought content normal.        Judgment: Judgment normal.    CBC    Component Value Date/Time   WBC 8.1 06/24/2021 0409   RBC 3.87 (L) 06/24/2021 0409   HGB 12.2 (L) 06/24/2021 0409   HGB 12.7 (L) 09/08/2019 1121   HCT 35.2 (L) 06/24/2021 0409   HCT 36.8 (L) 09/08/2019 1121   PLT 143 (L) 06/24/2021 0409    PLT 191 09/08/2019 1121   MCV 91.0 06/24/2021 0409   MCV 92 09/08/2019 1121   MCH 31.5 06/24/2021 0409   MCHC 34.7 06/24/2021 0409   RDW 14.7 06/24/2021 0409   RDW 11.8 09/08/2019 1121   LYMPHSABS 2.0 06/23/2021 1048   MONOABS 0.7 06/23/2021 1048   EOSABS 0.0 06/23/2021 1048   BASOSABS 0.0 06/23/2021 1048   BMP Latest Ref Rng & Units 07/05/2021 06/29/2021 06/25/2021  Glucose 70 - 99 mg/dL 214(H) 150(H) 149(H)  BUN 8 - 23 mg/dL 12 20 23   Creatinine 0.61 - 1.24 mg/dL 1.15 1.24 1.35(H)  BUN/Creat Ratio 10 - 24 - 16 -  Sodium 135 - 145 mmol/L 131(L) 134 129(L)  Potassium 3.5 - 5.1 mmol/L 4.7 4.7 3.8  Chloride 98 - 111 mmol/L 97(L) 94(L) 93(L)  CO2 22 - 32 mmol/L 27 25 25   Calcium 8.9 - 10.3 mg/dL 9.4 9.2 8.6(L)   Chest imaging: CXR 06/25/21 Significantly worsening aeration throughout the lungs bilaterally, with increasing hazy opacities throughout the right mid to lower lung and in the left lung base, concerning for worsening multilobar bilateral pneumonia. Moderate right and small left pleural effusions. No pneumothorax. No evidence of pulmonary edema. Heart size is upper limits of normal. Upper mediastinal contours are within normal limits  CT Chest 06/23/21 1. Small-to-moderate sized bilateral pleural effusions with associated bibasilar compressive atelectasis. 2. Redemonstration of multiple subacute posterior right-sided rib fractures. No residual pneumothorax. 3. Stable cardiomegaly. 4. Mildly dilated central pulmonary arteries suggesting pulmonary arterial hypertension. 5. Aortic and coronary artery atherosclerosis  CXR 05/03/21 Return of right pleural effusion and  small left effusion.   PFT: No flowsheet data found.  Labs:  Path:  Echo 04/26/21: LV EF 55-60%. RV function and size is normal. LA moderately dilated. RA moderately dilated.     Assessment & Plan:   Pleural effusion - Plan: DG Chest 2 View  Chronic diastolic heart failure ([place]) - Plan: Home sleep  test  Dyspnea, unspecified type - Plan: Home sleep test  Fatigue, unspecified type - Plan: Home sleep test, Vitamin B12, Vitamin D 1,25 dihydroxy, TSH, TSH, Vitamin D 1,25 dihydroxy, Vitamin B12  Discussion: Adrian Olson is an 88 year old male, never smoker with history of mitral valve prolapse and mitral regurgitation with mitral valve clip, DM II, and hypertension who was recently admitted after multiple falls at home found to have pleural effusion s/p thoracentesis and another recent admission for heart failure exacerbation.   He continues to have fatigue and dyspnea despite being euvolemic at this time. He has basilar rales on exam today which could be fluid vs involvement of an inflammatory process. He also has significant weight loss and deconditioning.   We will check his TSH, Vit D and B12 levels for the fatigue. His TSH has been elevated recently.   We will schedule him for home sleep study.   Chest x-ray today shows improvement in the pleural effusions. We will await final radiology read and consider checking a high resolution CT chest scan to evaluate for ILD.  We will trial him on stiolto inhale and monitor for improvement.   Follow up in 1 month with PFTs.  Freda Jackson, MD [facility] Office: [phone]   See Amion for personal pager PCCM on call pager [phone] until 7pm. Please call Elink 7p-7a. [phone]    Patient's right pleural effusion was concerning for hemothorax in setting of nondisplaced rib fractures.  The pleural fluid did not meet exudative criteria based on LDH or total protein ratios.  Cytology is negative for malignant cells.  Unfortunately the fluid has returned and the patient is symptomatic once again with shortness of breath.  We have arranged for patient to be directly admitted to the hospital either [facility] or [facility].  I have notified the pulmonary services at each hospital about his arrival and need for  right pigtail catheter chest tube.  He will also need evaluation of the left pleural space and possible thoracentesis.  The plan was discussed with the patient, his wife and his daughter.  They expressed understanding and if his symptoms were to significantly progress before receiving a bed at the hospital then he is to present to the emergency department.  We will schedule patient for follow up in 4-6 weeks.  Freda Jackson, MD [facility] Office: [phone]     Current Outpatient Medications:    albuterol (VENTOLIN HFA) 108 (90 Base) MCG/ACT inhaler, Inhale 2 puffs into the lungs every 6 (six) hours as needed for wheezing or shortness of breath., Disp: 8 g, Rfl: 2   aspirin 81 MG EC tablet, Take 81 mg by mouth daily., Disp: , Rfl:    cetirizine (ZYRTEC ALLERGY) 10 MG tablet, Take 1 tablet (10 mg total) by mouth daily., Disp: 30 tablet, Rfl: 3   Cholecalciferol (VITAMIN D) 2000 UNITS tablet, Take 2,000 Units by mouth daily at 12 noon. , Disp: , Rfl:    Cyanocobalamin (VITAMIN B-12) 2000 MCG TBCR, 1 tablet, Disp: , Rfl:    feeding supplement (ENSURE ENLIVE / ENSURE PLUS) LIQD,  Take 237 mLs by mouth 2 (two) times daily between meals., Disp: 14220 mL, Rfl: 0   finasteride (PROSCAR) 5 MG tablet, Take 5 mg by mouth daily at 12 noon. , Disp: , Rfl:    fluticasone (FLONASE) 50 MCG/ACT nasal spray, Place 2 sprays into both nostrils daily., Disp: 18.2 mL, Rfl: 2   furosemide (LASIX) 40 MG tablet, Take 1 tablet (40 mg total) by mouth as needed. As needed for weight greater than 133lb. Take 1 potassium tablet when you take this, Disp: 30 tablet, Rfl: 0   levothyroxine (SYNTHROID, LEVOTHROID) 75 MCG tablet, Take 75 mcg by mouth daily before breakfast., Disp: , Rfl:    metFORMIN (GLUCOPHAGE) 500 MG tablet, Take 500 mg by mouth every evening. , Disp: , Rfl:    Multiple Vitamin (MULTIVITAMIN WITH MINERALS) TABS tablet, Take 1 tablet by mouth daily., Disp: 30 tablet, Rfl: 0    potassium chloride SA (KLOR-CON M) 20 MEQ tablet, Take 0.5 tablets (10 mEq total) by mouth as needed. Only take 1 tablet when you take your Furosemide, Disp: 90 tablet, Rfl: 0   pravastatin (PRAVACHOL) 20 MG tablet, Take 20 mg by mouth at bedtime. , Disp: , Rfl:    ramipril (ALTACE) 5 MG capsule, Take 5 mg by mouth daily. , Disp: , Rfl:    sodium chloride (OCEAN) 0.65 % SOLN nasal spray, Place 1 spray into both nostrils daily as needed for congestion., Disp: , Rfl:    tamsulosin (FLOMAX) 0.4 MG CAPS capsule, Take 0.4 mg by mouth daily at 12 noon. , Disp: , Rfl:    Thiamine HCl (VITAMIN B1 PO), Take 125 mcg by mouth daily., Disp: , Rfl:    Tiotropium Bromide-Olodaterol (STIOLTO RESPIMAT) 2.5-2.5 MCG/ACT AERS, Inhale 2 puffs into the lungs daily., Disp: 8 g, Rfl: 0   metoprolol tartrate (LOPRESSOR) 25 MG tablet, Take 0.5 tablets (12.5 mg total) by mouth 2 (two) times daily., Disp: 90 tablet, Rfl: 3

## 2021-07-12 NOTE — Patient Instructions (Addendum)
Continue fluticaonse 1-2 sprays per nostril daily  Take zyrtec tablet daily for allergies  Start stiolto inhaler 2 puffs daily  Use albuterol as needed 1-2 puffs every 4-6 hours as needed.   We will check a chest x-ray and labs today  We will schedule you for pulmonary function tests at the earliest possible time with follow up visit  We will order you a home sleep study

## 2021-07-13 ENCOUNTER — Encounter: Payer: Self-pay | Admitting: Pulmonary Disease

## 2021-07-13 LAB — TSH: TSH: 9.05 u[IU]/mL — ABNORMAL HIGH (ref 0.35–5.50)

## 2021-07-13 LAB — VITAMIN B12: Vitamin B-12: >1504 pg/mL — ABNORMAL HIGH (ref 211–911)

## 2021-07-14 DIAGNOSIS — R69 Illness, unspecified: Secondary | ICD-10-CM | POA: Diagnosis not present

## 2021-07-14 DIAGNOSIS — E1122 Type 2 diabetes mellitus with diabetic chronic kidney disease: Secondary | ICD-10-CM | POA: Diagnosis not present

## 2021-07-14 DIAGNOSIS — E785 Hyperlipidemia, unspecified: Secondary | ICD-10-CM | POA: Diagnosis not present

## 2021-07-14 DIAGNOSIS — E039 Hypothyroidism, unspecified: Secondary | ICD-10-CM | POA: Diagnosis not present

## 2021-07-14 DIAGNOSIS — I130 Hypertensive heart and chronic kidney disease with heart failure and stage 1 through stage 4 chronic kidney disease, or unspecified chronic kidney disease: Secondary | ICD-10-CM | POA: Diagnosis not present

## 2021-07-14 DIAGNOSIS — I5033 Acute on chronic diastolic (congestive) heart failure: Secondary | ICD-10-CM | POA: Diagnosis not present

## 2021-07-14 DIAGNOSIS — N1831 Chronic kidney disease, stage 3a: Secondary | ICD-10-CM | POA: Diagnosis not present

## 2021-07-14 DIAGNOSIS — S272XXD Traumatic hemopneumothorax, subsequent encounter: Secondary | ICD-10-CM | POA: Diagnosis not present

## 2021-07-14 DIAGNOSIS — S2241XD Multiple fractures of ribs, right side, subsequent encounter for fracture with routine healing: Secondary | ICD-10-CM | POA: Diagnosis not present

## 2021-07-15 ENCOUNTER — Telehealth: Payer: Self-pay | Admitting: Pulmonary Disease

## 2021-07-15 DIAGNOSIS — J90 Pleural effusion, not elsewhere classified: Secondary | ICD-10-CM

## 2021-07-15 DIAGNOSIS — R0602 Shortness of breath: Secondary | ICD-10-CM

## 2021-07-15 LAB — VITAMIN D 1,25 DIHYDROXY
Vitamin D 1, 25 (OH)2 Total: 26 pg/mL (ref 18–72)
Vitamin D2 1, 25 (OH)2: <8 pg/mL
Vitamin D3 1, 25 (OH)2: 26 pg/mL

## 2021-07-15 NOTE — Telephone Encounter (Signed)
Called and spoke with patient's wife Emogene (on [place]). She verbalized understanding and will let the patient know about the CT scan.  ? ?Nothing further needed at time of call.  ?

## 2021-07-15 NOTE — Progress Notes (Deleted)
HRCT Chest ordered for further evaluation.

## 2021-07-15 NOTE — Telephone Encounter (Signed)
Please let patient know I have ordered a high resolution CT Chest scan for further evaluation. ? ?JD ?

## 2021-07-20 DIAGNOSIS — N1831 Chronic kidney disease, stage 3a: Secondary | ICD-10-CM | POA: Diagnosis not present

## 2021-07-20 DIAGNOSIS — S272XXD Traumatic hemopneumothorax, subsequent encounter: Secondary | ICD-10-CM | POA: Diagnosis not present

## 2021-07-20 DIAGNOSIS — E039 Hypothyroidism, unspecified: Secondary | ICD-10-CM | POA: Diagnosis not present

## 2021-07-20 DIAGNOSIS — E785 Hyperlipidemia, unspecified: Secondary | ICD-10-CM | POA: Diagnosis not present

## 2021-07-20 DIAGNOSIS — S2241XD Multiple fractures of ribs, right side, subsequent encounter for fracture with routine healing: Secondary | ICD-10-CM | POA: Diagnosis not present

## 2021-07-20 DIAGNOSIS — I130 Hypertensive heart and chronic kidney disease with heart failure and stage 1 through stage 4 chronic kidney disease, or unspecified chronic kidney disease: Secondary | ICD-10-CM | POA: Diagnosis not present

## 2021-07-20 DIAGNOSIS — R69 Illness, unspecified: Secondary | ICD-10-CM | POA: Diagnosis not present

## 2021-07-20 DIAGNOSIS — I5033 Acute on chronic diastolic (congestive) heart failure: Secondary | ICD-10-CM | POA: Diagnosis not present

## 2021-07-20 DIAGNOSIS — E1122 Type 2 diabetes mellitus with diabetic chronic kidney disease: Secondary | ICD-10-CM | POA: Diagnosis not present

## 2021-07-25 ENCOUNTER — Other Ambulatory Visit: Payer: Self-pay

## 2021-07-25 ENCOUNTER — Ambulatory Visit (INDEPENDENT_AMBULATORY_CARE_PROVIDER_SITE_OTHER)
Admission: RE | Admit: 2021-07-25 | Discharge: 2021-07-25 | Disposition: A | Discharge status: Home / Self Care | Payer: Medicare HMO | Source: Ambulatory Visit | Attending: Pulmonary Disease | Admitting: Pulmonary Disease

## 2021-07-25 DIAGNOSIS — S2241XD Multiple fractures of ribs, right side, subsequent encounter for fracture with routine healing: Secondary | ICD-10-CM | POA: Diagnosis not present

## 2021-07-25 DIAGNOSIS — R0602 Shortness of breath: Secondary | ICD-10-CM

## 2021-07-25 DIAGNOSIS — J9811 Atelectasis: Secondary | ICD-10-CM | POA: Diagnosis not present

## 2021-07-25 DIAGNOSIS — R0600 Dyspnea, unspecified: Secondary | ICD-10-CM | POA: Diagnosis not present

## 2021-07-25 DIAGNOSIS — J90 Pleural effusion, not elsewhere classified: Secondary | ICD-10-CM | POA: Diagnosis not present

## 2021-07-26 ENCOUNTER — Telehealth: Payer: Self-pay | Admitting: Pulmonary Disease

## 2021-07-26 DIAGNOSIS — E039 Hypothyroidism, unspecified: Secondary | ICD-10-CM | POA: Diagnosis not present

## 2021-07-26 DIAGNOSIS — E1122 Type 2 diabetes mellitus with diabetic chronic kidney disease: Secondary | ICD-10-CM | POA: Diagnosis not present

## 2021-07-26 DIAGNOSIS — N1831 Chronic kidney disease, stage 3a: Secondary | ICD-10-CM | POA: Diagnosis not present

## 2021-07-26 DIAGNOSIS — E785 Hyperlipidemia, unspecified: Secondary | ICD-10-CM | POA: Diagnosis not present

## 2021-07-26 DIAGNOSIS — I130 Hypertensive heart and chronic kidney disease with heart failure and stage 1 through stage 4 chronic kidney disease, or unspecified chronic kidney disease: Secondary | ICD-10-CM | POA: Diagnosis not present

## 2021-07-26 DIAGNOSIS — S2241XD Multiple fractures of ribs, right side, subsequent encounter for fracture with routine healing: Secondary | ICD-10-CM | POA: Diagnosis not present

## 2021-07-26 DIAGNOSIS — I5033 Acute on chronic diastolic (congestive) heart failure: Secondary | ICD-10-CM | POA: Diagnosis not present

## 2021-07-26 DIAGNOSIS — R69 Illness, unspecified: Secondary | ICD-10-CM | POA: Diagnosis not present

## 2021-07-26 DIAGNOSIS — S272XXD Traumatic hemopneumothorax, subsequent encounter: Secondary | ICD-10-CM | POA: Diagnosis not present

## 2021-07-26 NOTE — Telephone Encounter (Signed)
Spoke to patient's daughter, Adrian Olson(DPR). ?[name] is requesting CT results from 07/25/2021. ?Patient is experiencing persistent sob with exertion. ?Denied f/c/s or additional sx.  ? ?Dr. Erin Fulling, please advise. Thanks  ?

## 2021-07-26 NOTE — Telephone Encounter (Signed)
Called and spoke with patient's daughter [name]. She verbalized understanding. He has been scheduled for 07/28/21 at 11am. She will try to get him earlier if possible.  ? ?Nothing further needed at time of call.  ?

## 2021-07-26 NOTE — Telephone Encounter (Signed)
Patient's CT Chest scan shows small bilateral pleural effusions, similar to before, likely from his heart failure. There are no signs of inflammatory lung disease at this time. I would talk with your cardiologist about taking the lasix again for a period of time. ? ?We will schedule you for an appointment at 11am on 3/16 to evaluate for possible thoracentesis in office to see if we can drain any fluid.  ? ?Thanks, ?JD ? ? ? ?

## 2021-07-27 ENCOUNTER — Telehealth: Payer: Self-pay | Admitting: Interventional Cardiology

## 2021-07-27 DIAGNOSIS — R69 Illness, unspecified: Secondary | ICD-10-CM | POA: Diagnosis not present

## 2021-07-27 DIAGNOSIS — I5033 Acute on chronic diastolic (congestive) heart failure: Secondary | ICD-10-CM | POA: Diagnosis not present

## 2021-07-27 DIAGNOSIS — S2241XD Multiple fractures of ribs, right side, subsequent encounter for fracture with routine healing: Secondary | ICD-10-CM | POA: Diagnosis not present

## 2021-07-27 DIAGNOSIS — N1831 Chronic kidney disease, stage 3a: Secondary | ICD-10-CM | POA: Diagnosis not present

## 2021-07-27 DIAGNOSIS — S272XXD Traumatic hemopneumothorax, subsequent encounter: Secondary | ICD-10-CM | POA: Diagnosis not present

## 2021-07-27 DIAGNOSIS — I130 Hypertensive heart and chronic kidney disease with heart failure and stage 1 through stage 4 chronic kidney disease, or unspecified chronic kidney disease: Secondary | ICD-10-CM | POA: Diagnosis not present

## 2021-07-27 DIAGNOSIS — E785 Hyperlipidemia, unspecified: Secondary | ICD-10-CM | POA: Diagnosis not present

## 2021-07-27 DIAGNOSIS — E039 Hypothyroidism, unspecified: Secondary | ICD-10-CM | POA: Diagnosis not present

## 2021-07-27 DIAGNOSIS — E1122 Type 2 diabetes mellitus with diabetic chronic kidney disease: Secondary | ICD-10-CM | POA: Diagnosis not present

## 2021-07-27 NOTE — Telephone Encounter (Signed)
STAT if HR is under 50 or over 120 ?(normal HR is 60-100 beats per minute) ? ?What is your heart rate? Daughter called and said patient's physical Therapist said his heart rate was low- she did not tell her how low. She said his heart rate was low even after Therapy.She told her that he had an appointment on Monday. She said she was instructed to call today- daughter says he have not been feeling good for a few days ? ?Do you have a log of your heart rate readings (document readings)? no ? ?Do you have any other symptoms? Short of breath, when he moves around  ? ?

## 2021-07-27 NOTE — Telephone Encounter (Signed)
Spoke with daughter and she states her daughter was at the house with pt when PT was there today and they told her to contact us about low HR.  Unfortunately they were never told what the HR was.  Daughter states pt has intermittent SOB that has increased slightly but is not near as bad as it use to be when he was fluid overloaded.  Last weight daughter is aware of is 132lbs so pt has not used Furosemide.  Pt had CT performed and it showed bilateral pleural effusions.  Pt is seeing pulmonology tomorrow to see about possibly doing thoracentesis.  Advised daughter without knowing pt's HR there isn't a lot we can do.  Advised he has been running in the 60s recently based on his recent [facility] visits.  Advised daughter to keep plan to see pulmonology tomorrow and hopefully they can get some of the fluid off of him to help with breathing.  Made her aware that if they do not do thoracentesis, either they can call the office or pulmonology can call and discuss with our DOD about short regimen of Furosemide to help until we see pt in the office on Monday.  Daughter verbalized understanding and was in agreement with plan.  She will check pt's pulse when she goes by there today and call if any concerns.  ?

## 2021-07-28 ENCOUNTER — Encounter: Payer: Self-pay | Admitting: Pulmonary Disease

## 2021-07-28 ENCOUNTER — Ambulatory Visit: Payer: Medicare HMO | Admitting: Pulmonary Disease

## 2021-07-28 ENCOUNTER — Other Ambulatory Visit (HOSPITAL_COMMUNITY)
Admission: RE | Admit: 2021-07-28 | Discharge: 2021-07-28 | Disposition: A | Discharge status: Home / Self Care | Payer: Medicare HMO | Source: Ambulatory Visit | Attending: Pulmonary Disease | Admitting: Pulmonary Disease

## 2021-07-28 ENCOUNTER — Other Ambulatory Visit: Payer: Self-pay

## 2021-07-28 ENCOUNTER — Ambulatory Visit (INDEPENDENT_AMBULATORY_CARE_PROVIDER_SITE_OTHER): Payer: Medicare HMO

## 2021-07-28 VITALS — BP 124/72 | HR 52 | Ht 69.0 in | Wt 134.2 lb

## 2021-07-28 DIAGNOSIS — R0600 Dyspnea, unspecified: Secondary | ICD-10-CM

## 2021-07-28 DIAGNOSIS — J90 Pleural effusion, not elsewhere classified: Secondary | ICD-10-CM | POA: Insufficient documentation

## 2021-07-28 DIAGNOSIS — R0602 Shortness of breath: Secondary | ICD-10-CM

## 2021-07-28 DIAGNOSIS — E785 Hyperlipidemia, unspecified: Secondary | ICD-10-CM | POA: Diagnosis not present

## 2021-07-28 DIAGNOSIS — I517 Cardiomegaly: Secondary | ICD-10-CM | POA: Diagnosis not present

## 2021-07-28 DIAGNOSIS — J449 Chronic obstructive pulmonary disease, unspecified: Secondary | ICD-10-CM

## 2021-07-28 LAB — C-REACTIVE PROTEIN: CRP: <1 mg/dL (ref 0.5–20.0)

## 2021-07-28 LAB — COMPREHENSIVE METABOLIC PANEL
ALT: 48 U/L (ref 0–53)
AST: 47 U/L — ABNORMAL HIGH (ref 0–37)
Albumin: 3.9 g/dL (ref 3.5–5.2)
Alkaline Phosphatase: 70 U/L (ref 39–117)
BUN: 30 mg/dL — ABNORMAL HIGH (ref 6–23)
CO2: 25 mEq/L (ref 19–32)
Calcium: 9.7 mg/dL (ref 8.4–10.5)
Chloride: 101 mEq/L (ref 96–112)
Creatinine, Ser: 1.36 mg/dL (ref 0.40–1.50)
GFR: 46.53 mL/min — ABNORMAL LOW (ref >60.00)
Glucose, Bld: 150 mg/dL — ABNORMAL HIGH (ref 70–99)
Potassium: 4 mEq/L (ref 3.5–5.1)
Sodium: 136 mEq/L (ref 135–145)
Total Bilirubin: 0.9 mg/dL (ref 0.2–1.2)
Total Protein: 7.1 g/dL (ref 6.0–8.3)

## 2021-07-28 LAB — ALBUMIN, PLEURAL OR PERITONEAL FLUID: Albumin, Fluid: <1.5 g/dL

## 2021-07-28 LAB — BODY FLUID CELL COUNT WITH DIFFERENTIAL
Lymphs, Fluid: 82 %
Monocyte-Macrophage-Serous Fluid: 11 % — ABNORMAL LOW (ref 50–90)
Neutrophil Count, Fluid: 7 % (ref 0–25)
Total Nucleated Cell Count, Fluid: 203 cu mm (ref 0–1000)

## 2021-07-28 LAB — LACTATE DEHYDROGENASE, PLEURAL OR PERITONEAL FLUID: LD, Fluid: 50 U/L — ABNORMAL HIGH (ref 3–23)

## 2021-07-28 LAB — BRAIN NATRIURETIC PEPTIDE: Pro B Natriuretic peptide (BNP): 3863 pg/mL — ABNORMAL HIGH (ref 0.0–100.0)

## 2021-07-28 LAB — TRIGLYCERIDES: Triglycerides: 71 mg/dL (ref 0.0–149.0)

## 2021-07-28 LAB — SEDIMENTATION RATE: Sed Rate: 2 mm/h (ref 0–20)

## 2021-07-28 MED ORDER — STIOLTO RESPIMAT 2.5-2.5 MCG/ACT IN AERS: 2.0000 | INHALATION_SPRAY | Freq: Every day | RESPIRATORY_TRACT | 6 refills | Status: DC

## 2021-07-28 NOTE — Patient Instructions (Signed)
Please call clinic if you notice increasing shortness of breath or chest pain. If it is severe then go to the ER.  ? ?We will notify you of the pleural fluid results in the next day or two and consider resuming the lasix treatment.  ?

## 2021-07-28 NOTE — Progress Notes (Signed)
? ? ?Synopsis: Referred in December 2022 for pleural effusion after hospitalization ? ?Subjective:  ? ?PATIENT ID: Adrian Olson GENDER: male DOB: Dec 02, 1930, MRN: [id number] ? ?HPI ? ?Chief Complaint  ?Patient presents with  ? Follow-up  ?  F/U thoracentesis. Per patient, his SOB has improved since yesterday's phone call.   ? ?Adrian Olson is an 88 year old male, never smoker with history of mitral valve prolapse and mitral regurgitation with mitral valve clip, DM II, and hypertension who returns to pulmonary clinic for shortness of breath.  ? ?HRCT Chest 07/25/21 showed  bilateral pleural effusions similar to CT Chest on 06/23/21. Patient called the office 2 days ago complaining of increased dyspnea. He returns today for evaluation of pleural effusions.  ? ?Upon [place] evaluation he has moderate right pleural effusion and small to moderate left pleural effusion. He is amenable to thoracentesis today of the right side.  ? ?OV 07/12/21 ?Patient was re-admitted to the hospital on 2/9 to 2/11 for heart failure exacerbation. He was diuresed well and sent home on daily lasix. He is now on PRN lasix after follow up with the heart failure team on 07/05/2021. He was having increased weakness while on the lasix which has improved. Overall, he feels he still has exertional dyspnea and significant fatigue. His weight has been stable and his appetite seems to be improving a bit. ? ?He denies cough or wheezing. He is also complaining of throat congestion. ? ?OV 05/03/21 ?Patient had thoracentesis performed on 04/25/2021 with concern for hemothorax due to his fall and multiple rib fractures on the right side.  Follow-up chest x-ray on 12/13 showed resolution of the pleural effusion with no reaccumulation of fluid.  Patient had an ambulatory SPO2 evaluation with no desaturations and requirement for supplemental oxygen.  He was discharged home on 12/13. ? ?Since discharge patient was initially feeling okay at home but reports since  yesterday he has noticed increasing shortness of breath and fatigue.  He denies any fevers, chills or cough. ? ?Chest radiograph today shows return of the right pleural effusion and small left pleural effusion. ? ?Past Medical History:  ?Diagnosis Date  ? Bilateral carotid artery disease ([place])   ? Severe Left and moderate Right  ? Diabetes mellitus without complication ([place])   ? ED (erectile dysfunction)   ? Hyperlipidemia   ? Hypertension   ? Lumbar disc disease   ? Mitral valve prolapse   ? moderate to severe MR, LVEF 55% Echo 2010  ? Moderate mitral regurgitation 03/16/2014  ? S/P mitral valve clip implantation 10/09/2019  ? s/p TEER using 2 MitraClip XTW devices, first device positioned A2 P2, second device positioned A1 P1 by Dr. Burt Knack  ?  ? ?Family History  ?Problem Relation Age of Onset  ? Cancer Mother   ?     ovarian  ? Diabetes Father   ? Other Father   ?     enlarged prostate  ? Stroke Father   ? Heart disease Father   ? Heart disease Sister   ? Obesity Sister   ? Dementia Sister   ? Diabetes Sister   ? Other Sister   ?     nornmal old age problems  ?  ? ?Social History  ? ?Socioeconomic History  ? Marital status: Married  ?  Spouse name: Not on file  ? Number of children: Not on file  ? Years of education: Not on file  ? Highest education level: Not on file  ?  Occupational History  ? Not on file  ?Tobacco Use  ? Smoking status: Never  ? Smokeless tobacco: Never  ?Vaping Use  ? Vaping Use: Never used  ?Substance and Sexual Activity  ? Alcohol use: No  ?  Alcohol/week: 0.0 standard drinks  ? Drug use: No  ? Sexual activity: Not on file  ?Other Topics Concern  ? Not on file  ?Social History Narrative  ? Not on file  ? ?Social Determinants of Health  ? ?Financial Resource Strain: Not on file  ?Food Insecurity: Not on file  ?Transportation Needs: Not on file  ?Physical Activity: Not on file  ?Stress: Not on file  ?Social Connections: Not on file  ?Intimate Partner Violence: Not on file  ?  ? ?Allergies   ?Allergen Reactions  ? Duloxetine Hcl Other (See Comments)  ?  withdrawal  ?  ? ?Outpatient Medications Prior to Visit  ?Medication Sig Dispense Refill  ? albuterol (VENTOLIN HFA) 108 (90 Base) MCG/ACT inhaler Inhale 2 puffs into the lungs every 6 (six) hours as needed for wheezing or shortness of breath. 8 g 2  ? aspirin 81 MG EC tablet Take 81 mg by mouth daily.    ? cetirizine (ZYRTEC ALLERGY) 10 MG tablet Take 1 tablet (10 mg total) by mouth daily. 30 tablet 3  ? Cholecalciferol (VITAMIN D) 2000 UNITS tablet Take 2,000 Units by mouth daily at 12 noon.     ? Cyanocobalamin (VITAMIN B-12) 2000 MCG TBCR 1 tablet    ? finasteride (PROSCAR) 5 MG tablet Take 5 mg by mouth daily at 12 noon.     ? fluticasone (FLONASE) 50 MCG/ACT nasal spray Place 2 sprays into both nostrils daily. 18.2 mL 2  ? levothyroxine (SYNTHROID, LEVOTHROID) 75 MCG tablet Take 75 mcg by mouth daily before breakfast.    ? metFORMIN (GLUCOPHAGE) 500 MG tablet Take 500 mg by mouth every evening.     ? pravastatin (PRAVACHOL) 20 MG tablet Take 20 mg by mouth at bedtime.     ? ramipril (ALTACE) 5 MG capsule Take 5 mg by mouth daily.     ? sertraline (ZOLOFT) 50 MG tablet Take 50 mg by mouth daily.    ? sodium chloride (OCEAN) 0.65 % SOLN nasal spray Place 1 spray into both nostrils daily as needed for congestion.    ? tamsulosin (FLOMAX) 0.4 MG CAPS capsule Take 0.4 mg by mouth daily at 12 noon.     ? Thiamine HCl (VITAMIN B1 PO) Take 125 mcg by mouth daily.    ? Tiotropium Bromide-Olodaterol (STIOLTO RESPIMAT) 2.5-2.5 MCG/ACT AERS Inhale 2 puffs into the lungs daily. 8 g 0  ? metoprolol tartrate (LOPRESSOR) 25 MG tablet Take 0.5 tablets (12.5 mg total) by mouth 2 (two) times daily. 90 tablet 3  ? furosemide (LASIX) 40 MG tablet Take 1 tablet (40 mg total) by mouth as needed. As needed for weight greater than 133lb. Take 1 potassium tablet when you take this 30 tablet 0  ? potassium chloride SA (KLOR-CON M) 20 MEQ tablet Take 0.5 tablets (10 mEq  total) by mouth as needed. Only take 1 tablet when you take your Furosemide 90 tablet 0  ? ?No facility-administered medications prior to visit.  ? ? ?Review of Systems  ?Constitutional:  Positive for malaise/fatigue. Negative for chills, fever and weight loss.  ?HENT:  Negative for congestion, sinus pain and sore throat.   ?Eyes: Negative.   ?Respiratory:  Positive for shortness of breath. Negative for cough,  hemoptysis, sputum production and wheezing.   ?Cardiovascular:  Negative for chest pain, palpitations, orthopnea, claudication and leg swelling.  ?Gastrointestinal:  Negative for abdominal pain, heartburn, nausea and vomiting.  ?Genitourinary: Negative.   ?Musculoskeletal:  Negative for joint pain and myalgias.  ?Skin:  Negative for rash.  ?Neurological:  Negative for weakness.  ?Endo/Heme/Allergies: Negative.   ?Psychiatric/Behavioral: Negative.    ? ?Objective:  ? ?Vitals:  ? 07/28/21 1107  ?BP: 124/72  ?Pulse: (!) 52  ?SpO2: 96%  ?Weight: 134 lb 3.2 oz (60.9 kg)  ?Height: '5\' 9"'$  (1.753 m)  ? ? ?Physical Exam ?Constitutional:   ?   General: He is not in acute distress. ?HENT:  ?   Head: Normocephalic and atraumatic.  ?Eyes:  ?   Conjunctiva/sclera: Conjunctivae normal.  ?Cardiovascular:  ?   Rate and Rhythm: Normal rate and regular rhythm.  ?   Pulses: Normal pulses.  ?   Heart sounds: Normal heart sounds. No murmur heard. ?Pulmonary:  ?   Effort: No respiratory distress.  ?   Breath sounds: Examination of the right-lower field reveals decreased breath sounds. Examination of the left-lower field reveals decreased breath sounds. Decreased breath sounds present. No wheezing, rhonchi or rales.  ?Musculoskeletal:  ?   Right lower leg: No edema.  ?   Left lower leg: No edema.  ?Skin: ?   General: Skin is warm and dry.  ?Neurological:  ?   General: No focal deficit present.  ?   Mental Status: He is alert.  ?Psychiatric:     ?   Mood and Affect: Mood normal.     ?   Behavior: Behavior normal.     ?   Thought  Content: Thought content normal.     ?   Judgment: Judgment normal.  ? ? ?CBC ?   ?Component Value Date/Time  ? WBC 8.1 06/24/2021 0409  ? RBC 3.87 (L) 06/24/2021 0409  ? HGB 12.2 (L) 06/24/2021 0409  ? HGB 12.7 (L) 04/26/2

## 2021-07-28 NOTE — Progress Notes (Signed)
Thoracentesis  Procedure Note ? ?Adrian Olson  ?[id number]  ?12/20/1930 ? ?Date:07/28/21  ?Time:1:36 PM  ? ?Provider Performing:Mihir Flanigan B Ski Polich  ? ?Procedure: Thoracentesis with imaging guidance (87867) ? ?Indication(s) ?Pleural Effusion ? ?Consent ?Risks of the procedure as well as the alternatives and risks of each were explained to the patient and/or caregiver.  Consent for the procedure was obtained and is signed in the bedside chart ? ?Anesthesia ?Topical only with 1% lidocaine  ? ? ?Time Out ?Verified patient identification, verified procedure, site/side was marked, verified correct patient position, special equipment/implants available, medications/allergies/relevant history reviewed, required imaging and test results available. ? ? ?Sterile Technique ?Maximal sterile technique including full sterile barrier drape, hand hygiene, sterile gown, sterile gloves, mask, hair covering, sterile ultrasound probe cover (if used). ? ?Procedure Description ?Ultrasound was used to identify appropriate pleural anatomy for placement and overlying skin marked.  Area of drainage cleaned and draped in sterile fashion. Lidocaine was used to anesthetize the skin and subcutaneous tissue.  1100 cc's of straw appearing fluid was drained from the right pleural space. Catheter then removed and bandaid applied to site. ? ? ?Complications/Tolerance ?None; patient tolerated the procedure well. ?Chest X-ray is ordered to confirm no post-procedural complication. ? ? ?EBL ?Minimal ? ? ?Specimen(s) ?Pleural fluid ? ? ? ? ? ? ? ? ?  ?

## 2021-07-28 NOTE — Progress Notes (Signed)
? ? ?Synopsis: Referred in December 2022 for pleural effusion after hospitalization ? ?Subjective:  ? ?PATIENT ID: Adrian Olson GENDER: male DOB: [birth year], MRN: [id number] ? ?HPI ? ?Chief Complaint  ?Patient presents with  ? Follow-up  ?  F/U thoracentesis. Per patient, his SOB has improved since yesterday's phone call.   ? ?Adrian Olson is an 88 year old male, never smoker with history of mitral valve prolapse and mitral regurgitation with mitral valve clip, DM II, and hypertension who returns to pulmonary clinic for shortness of breath.  ? ?Dysphagia? ? ? ?OV 07/12/21 ?Patient was re-admitted to the hospital on 2/9 to 2/11 for heart failure exacerbation. He was diuresed well and sent home on daily lasix. He is now on PRN lasix after follow up with the heart failure team on 07/05/2021. He was having increased weakness while on the lasix which has improved. Overall, he feels he still has exertional dyspnea and significant fatigue. His weight has been stable and his appetite seems to be improving a bit. ? ?He denies cough or wheezing. He is also complaining of throat congestion. ? ?OV 05/03/21 ?Patient had thoracentesis performed on 04/25/2021 with concern for hemothorax due to his fall and multiple rib fractures on the right side.  Follow-up chest x-ray on 12/13 showed resolution of the pleural effusion with no reaccumulation of fluid.  Patient had an ambulatory SPO2 evaluation with no desaturations and requirement for supplemental oxygen.  He was discharged home on 12/13. ? ?Since discharge patient was initially feeling okay at home but reports since yesterday he has noticed increasing shortness of breath and fatigue.  He denies any fevers, chills or cough. ? ?Chest radiograph today shows return of the right pleural effusion and small left pleural effusion. ? ?Past Medical History:  ?Diagnosis Date  ? Bilateral carotid artery disease ([place])   ? Severe Left and moderate Right  ? Diabetes mellitus without  complication ([place])   ? ED (erectile dysfunction)   ? Hyperlipidemia   ? Hypertension   ? Lumbar disc disease   ? Mitral valve prolapse   ? moderate to severe MR, LVEF 55% Echo 2010  ? Moderate mitral regurgitation 03/16/2014  ? S/P mitral valve clip implantation 10/09/2019  ? s/p TEER using 2 MitraClip XTW devices, first device positioned A2 P2, second device positioned A1 P1 by Dr. Burt Knack  ?  ? ?Family History  ?Problem Relation Age of Onset  ? Cancer Mother   ?     ovarian  ? Diabetes Father   ? Other Father   ?     enlarged prostate  ? Stroke Father   ? Heart disease Father   ? Heart disease Sister   ? Obesity Sister   ? Dementia Sister   ? Diabetes Sister   ? Other Sister   ?     nornmal old age problems  ?  ? ?Social History  ? ?Socioeconomic History  ? Marital status: Married  ?  Spouse name: Not on file  ? Number of children: Not on file  ? Years of education: Not on file  ? Highest education level: Not on file  ?Occupational History  ? Not on file  ?Tobacco Use  ? Smoking status: Never  ? Smokeless tobacco: Never  ?Vaping Use  ? Vaping Use: Never used  ?Substance and Sexual Activity  ? Alcohol use: No  ?  Alcohol/week: 0.0 standard drinks  ? Drug use: No  ? Sexual activity: Not on file  ?  Other Topics Concern  ? Not on file  ?Social History Narrative  ? Not on file  ? ?Social Determinants of Health  ? ?Financial Resource Strain: Not on file  ?Food Insecurity: Not on file  ?Transportation Needs: Not on file  ?Physical Activity: Not on file  ?Stress: Not on file  ?Social Connections: Not on file  ?Intimate Partner Violence: Not on file  ?  ? ?Allergies  ?Allergen Reactions  ? Duloxetine Hcl Other (See Comments)  ?  withdrawal  ?  ? ?Outpatient Medications Prior to Visit  ?Medication Sig Dispense Refill  ? albuterol (VENTOLIN HFA) 108 (90 Base) MCG/ACT inhaler Inhale 2 puffs into the lungs every 6 (six) hours as needed for wheezing or shortness of breath. 8 g 2  ? aspirin 81 MG EC tablet Take 81 mg by mouth  daily.    ? cetirizine (ZYRTEC ALLERGY) 10 MG tablet Take 1 tablet (10 mg total) by mouth daily. 30 tablet 3  ? Cholecalciferol (VITAMIN D) 2000 UNITS tablet Take 2,000 Units by mouth daily at 12 noon.     ? Cyanocobalamin (VITAMIN B-12) 2000 MCG TBCR 1 tablet    ? finasteride (PROSCAR) 5 MG tablet Take 5 mg by mouth daily at 12 noon.     ? fluticasone (FLONASE) 50 MCG/ACT nasal spray Place 2 sprays into both nostrils daily. 18.2 mL 2  ? levothyroxine (SYNTHROID, LEVOTHROID) 75 MCG tablet Take 75 mcg by mouth daily before breakfast.    ? metFORMIN (GLUCOPHAGE) 500 MG tablet Take 500 mg by mouth every evening.     ? pravastatin (PRAVACHOL) 20 MG tablet Take 20 mg by mouth at bedtime.     ? ramipril (ALTACE) 5 MG capsule Take 5 mg by mouth daily.     ? sertraline (ZOLOFT) 50 MG tablet Take 50 mg by mouth daily.    ? sodium chloride (OCEAN) 0.65 % SOLN nasal spray Place 1 spray into both nostrils daily as needed for congestion.    ? tamsulosin (FLOMAX) 0.4 MG CAPS capsule Take 0.4 mg by mouth daily at 12 noon.     ? Thiamine HCl (VITAMIN B1 PO) Take 125 mcg by mouth daily.    ? Tiotropium Bromide-Olodaterol (STIOLTO RESPIMAT) 2.5-2.5 MCG/ACT AERS Inhale 2 puffs into the lungs daily. 8 g 0  ? metoprolol tartrate (LOPRESSOR) 25 MG tablet Take 0.5 tablets (12.5 mg total) by mouth 2 (two) times daily. 90 tablet 3  ? furosemide (LASIX) 40 MG tablet Take 1 tablet (40 mg total) by mouth as needed. As needed for weight greater than 133lb. Take 1 potassium tablet when you take this 30 tablet 0  ? potassium chloride SA (KLOR-CON M) 20 MEQ tablet Take 0.5 tablets (10 mEq total) by mouth as needed. Only take 1 tablet when you take your Furosemide 90 tablet 0  ? ?No facility-administered medications prior to visit.  ? ?Review of Systems  ?Constitutional:  Positive for malaise/fatigue. Negative for chills, fever and weight loss.  ?HENT:  Negative for congestion, sinus pain and sore throat.   ?Eyes: Negative.   ?Respiratory:   Positive for shortness of breath. Negative for cough, hemoptysis, sputum production and wheezing.   ?Cardiovascular:  Negative for chest pain, palpitations, orthopnea, claudication and leg swelling.  ?Gastrointestinal:  Negative for abdominal pain, heartburn, nausea and vomiting.  ?Genitourinary: Negative.   ?Musculoskeletal:  Negative for joint pain and myalgias.  ?Skin:  Negative for rash.  ?Neurological:  Negative for weakness.  ?Endo/Heme/Allergies: Negative.   ?  Psychiatric/Behavioral: Negative.    ? ?Objective:  ? ?Vitals:  ? 07/28/21 1107  ?BP: 124/72  ?Pulse: (!) 52  ?SpO2: 96%  ?Weight: 134 lb 3.2 oz (60.9 kg)  ?Height: '5\' 9"'$  (1.753 m)  ? ? ?Physical Exam ?Constitutional:   ?   General: He is not in acute distress. ?HENT:  ?   Head: Normocephalic and atraumatic.  ?Eyes:  ?   Conjunctiva/sclera: Conjunctivae normal.  ?Cardiovascular:  ?   Rate and Rhythm: Normal rate and regular rhythm.  ?   Pulses: Normal pulses.  ?   Heart sounds: Normal heart sounds. No murmur heard. ?Pulmonary:  ?   Effort: No respiratory distress (Mild).  ?   Breath sounds: Rales (bibasilar) present. No wheezing or rhonchi.  ?Musculoskeletal:  ?   Right lower leg: No edema.  ?   Left lower leg: No edema.  ?Skin: ?   General: Skin is warm and dry.  ?Neurological:  ?   General: No focal deficit present.  ?   Mental Status: He is alert.  ?Psychiatric:     ?   Mood and Affect: Mood normal.     ?   Behavior: Behavior normal.     ?   Thought Content: Thought content normal.     ?   Judgment: Judgment normal.  ? ? ?CBC ?   ?Component Value Date/Time  ? WBC 8.1 06/24/2021 0409  ? RBC 3.87 (L) 06/24/2021 0409  ? HGB 12.2 (L) 06/24/2021 0409  ? HGB 12.7 (L) 09/08/2019 1121  ? HCT 35.2 (L) 06/24/2021 0409  ? HCT 36.8 (L) 09/08/2019 1121  ? PLT 143 (L) 06/24/2021 0409  ? PLT 191 09/08/2019 1121  ? MCV 91.0 06/24/2021 0409  ? MCV 92 09/08/2019 1121  ? MCH 31.5 06/24/2021 0409  ? MCHC 34.7 06/24/2021 0409  ? RDW 14.7 06/24/2021 0409  ? RDW 11.8  09/08/2019 1121  ? LYMPHSABS 2.0 06/23/2021 1048  ? MONOABS 0.7 06/23/2021 1048  ? EOSABS 0.0 06/23/2021 1048  ? BASOSABS 0.0 06/23/2021 1048  ? ?BMP Latest Ref Rng & Units 07/05/2021 06/29/2021 06/25/2021  ?Glucose 70 - 99 mg/dL 2

## 2021-07-29 LAB — CK TOTAL AND CKMB (NOT AT ARMC)
CK, MB: 2.7 ng/mL (ref 0–5.0)
Relative Index: 3.1 (ref 0–4.0)
Total CK: 87 U/L (ref 44–196)

## 2021-07-29 LAB — ALDOLASE

## 2021-07-29 LAB — RNP ANTIBODIES: ENA RNP Ab: 0.2 AI (ref 0.0–0.9)

## 2021-07-29 LAB — CYTOLOGY - NON PAP

## 2021-07-31 LAB — CHOLESTEROL, BODY FLUID: Cholesterol, Fluid: 13 mg/dL

## 2021-07-31 NOTE — Progress Notes (Signed)
?Cardiology Office Note:   ? ?Date:  08/01/2021  ? ?ID:  Adrian Olson, DOB 1931-05-06, MRN [id number] ? ?PCP:  Merrilee Seashore, MD  ?Cardiologist:  [name], MD  ? ?Referring MD: Merrilee Seashore, MD  ? ?Chief Complaint  ?Patient presents with  ? Congestive Heart Failure  ? ? ?History of Present Illness:   ? ?Adrian Olson is a 88 y.o. male with a hx of degenerative mitral valve disease, severe mitral regurgitation, hypertension, and bilateral carotid artery disease.  Also has hyperlipidemia, hypertension, ED, and Transcatheter EER  (MitraClip 10/09/19) with persistent moderate to severe mitral regurgitation. ? ?Recent weakness, orthostasis, right pleural effusion, and fatigue., Has been seen by multiple practitioners since my last opportunity. Multiple APP and pulmonary visits. ? ?Very displeased with his recent cardiac care.  Has seen a different provider on every visit since November 28.  Most recent visit was a patient of care by [place] PA-C.  The patient was assessed including transthoracic impedance and felt to be volume contracted.  When seen, his weight was down to 121 pounds.  He was feeling weak.  After holding diuretic therapy for days, his weight rebounded to above 130 pounds and appetite has improved.  He was told to take furosemide as needed for weights greater than 133 pounds.  He was set to see me 1 month following the transition of care visit. ? ?In the interval since July 05, 2021, he is slowly improved.  He did have a large amount of right pleural effusion drained at the pulmonologist office on 07/28/2021.  He has noted some progressive lower extremity edema.  He does have some dyspnea on exertion.  He is not currently on diuretic therapy.  He feels tired and sleepy all the time. ? ?Past Medical History:  ?Diagnosis Date  ? Bilateral carotid artery disease ([place])   ? Severe Left and moderate Right  ? Diabetes mellitus without complication ([place])   ? ED (erectile  dysfunction)   ? Hyperlipidemia   ? Hypertension   ? Lumbar disc disease   ? Mitral valve prolapse   ? moderate to severe MR, LVEF 55% Echo 2010  ? Moderate mitral regurgitation 03/16/2014  ? S/P mitral valve clip implantation 10/09/2019  ? s/p TEER using 2 MitraClip XTW devices, first device positioned A2 P2, second device positioned A1 P1 by Dr. Burt Knack  ? ? ?Past Surgical History:  ?Procedure Laterality Date  ? CARDIAC CATHETERIZATION    ? MITRAL VALVE REPAIR  10/09/2019  ? MITRAL VALVE REPAIR (N/A )  ? MITRAL VALVE REPAIR N/A 10/09/2019  ? Procedure: MITRAL VALVE REPAIR;  Surgeon: Sherren Mocha, MD;  Location: [place] [facility];  Service: Cardiovascular;  Laterality: N/A;  ? RIGHT/LEFT HEART CATH AND CORONARY ANGIOGRAPHY N/A 09/16/2019  ? Procedure: RIGHT/LEFT HEART CATH AND CORONARY ANGIOGRAPHY;  Surgeon: Belva Crome, MD;  Location: [place] [facility];  Service: Cardiovascular;  Laterality: N/A;  ? TEE WITHOUT CARDIOVERSION N/A 09/16/2019  ? Procedure: TRANSESOPHAGEAL ECHOCARDIOGRAM (TEE);  Surgeon: Lelon Perla, MD;  Location: [facility] ENDOSCOPY;  Service: Cardiovascular;  Laterality: N/A;  ? ? ?Current Medications: ?Current Meds  ?Medication Sig  ? albuterol (VENTOLIN HFA) 108 (90 Base) MCG/ACT inhaler Inhale 2 puffs into the lungs every 6 (six) hours as needed for wheezing or shortness of breath.  ? aspirin 81 MG EC tablet Take 81 mg by mouth daily.  ? cetirizine (ZYRTEC ALLERGY) 10 MG tablet Take 1 tablet (10 mg total)  by mouth daily.  ? Cholecalciferol (VITAMIN D) 2000 UNITS tablet Take 2,000 Units by mouth daily at 12 noon.   ? Cyanocobalamin (VITAMIN B-12) 2000 MCG TBCR 1 tablet  ? finasteride (PROSCAR) 5 MG tablet Take 5 mg by mouth daily at 12 noon.   ? fluticasone (FLONASE) 50 MCG/ACT nasal spray Place 2 sprays into both nostrils daily.  ? furosemide (LASIX) 40 MG tablet Take 1 tablet (40 mg total) by mouth every Monday, Wednesday, and Friday.  ? levothyroxine (SYNTHROID) 100 MCG tablet Take 1 tablet by  mouth daily.  ? metFORMIN (GLUCOPHAGE) 500 MG tablet Take 500 mg by mouth every evening.   ? ONETOUCH VERIO test strip See admin instructions.  ? potassium chloride SA (KLOR-CON M) 20 MEQ tablet Take 0.5 tablets (10 mEq total) by mouth every Monday, Wednesday, and Friday.  ? pravastatin (PRAVACHOL) 20 MG tablet Take 20 mg by mouth at bedtime.   ? ramipril (ALTACE) 5 MG capsule Take 5 mg by mouth daily.   ? sertraline (ZOLOFT) 50 MG tablet Take 50 mg by mouth daily.  ? sodium chloride (OCEAN) 0.65 % SOLN nasal spray Place 1 spray into both nostrils daily as needed for congestion.  ? tamsulosin (FLOMAX) 0.4 MG CAPS capsule Take 0.4 mg by mouth daily at 12 noon.   ? Thiamine HCl (VITAMIN B1 PO) Take 125 mcg by mouth daily.  ? Tiotropium Bromide-Olodaterol (STIOLTO RESPIMAT) 2.5-2.5 MCG/ACT AERS Inhale 2 puffs into the lungs daily.  ? [DISCONTINUED] levothyroxine (SYNTHROID, LEVOTHROID) 75 MCG tablet Take 75 mcg by mouth daily before breakfast.  ?  ? ?Allergies:   Duloxetine hcl  ? ?Social History  ? ?Socioeconomic History  ? Marital status: Married  ?  Spouse name: Not on file  ? Number of children: Not on file  ? Years of education: Not on file  ? Highest education level: Not on file  ?Occupational History  ? Not on file  ?Tobacco Use  ? Smoking status: Never  ? Smokeless tobacco: Never  ?Vaping Use  ? Vaping Use: Never used  ?Substance and Sexual Activity  ? Alcohol use: No  ?  Alcohol/week: 0.0 standard drinks  ? Drug use: No  ? Sexual activity: Not on file  ?Other Topics Concern  ? Not on file  ?Social History Narrative  ? Not on file  ? ?Social Determinants of Health  ? ?Financial Resource Strain: Not on file  ?Food Insecurity: Not on file  ?Transportation Needs: Not on file  ?Physical Activity: Not on file  ?Stress: Not on file  ?Social Connections: Not on file  ?  ? ?Family History: ?The patient's family history includes Cancer in his mother; Dementia in his sister; Diabetes in his father and sister; Heart  disease in his father and sister; Obesity in his sister; Other in his father and sister; Stroke in his father. ? ?ROS:   ?Please see the history of present illness.    ?Appetite has picked up some.  He has no energy.  All other systems reviewed and are negative. ? ?EKGs/Labs/Other Studies Reviewed:   ? ?The following studies were reviewed today: ?2D Doppler echocardiogram December 2022: ?IMPRESSIONS  ? ? ? 1. Left ventricular ejection fraction, by estimation, is 55 to 60%. Left  ?ventricular ejection fraction by 3D volume is 56 %. The left ventricle has  ?normal function. The left ventricle has no regional wall motion  ?abnormalities. There is mild concentric  ?left ventricular hypertrophy. Left ventricular diastolic function could  ?  not be evaluated. Elevated left atrial pressure.  ? 2. Right ventricular systolic function is normal. The right ventricular  ?size is normal. Tricuspid regurgitation signal is inadequate for assessing  ?PA pressure.  ? 3. Left atrial size was moderately dilated.  ? 4. Right atrial size was moderately dilated.  ? 5. Moderate pleural effusion in the left lateral region.  ? 6. The mitral valve has been repaired/replaced. Moderate mitral valve  ?regurgitation. The mean mitral valve gradient is 4.0 mmHg with average  ?heart rate of 58 bpm. There is a Mitra-Clip present in the mitral  ?position. Procedure Date: 10/09/2019.  ? 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  ? 8. The aortic valve is tricuspid. There is moderate calcification of the  ?aortic valve. There is moderate thickening of the aortic valve. Aortic  ?valve regurgitation is mild.  ? 9. The inferior vena cava is dilated in size with <50% respiratory  ?variability, suggesting right atrial pressure of 15 mmHg.  ? ?Comparison(s): No significant change from prior study. Prior images  ?reviewed side by side.  ? ?EKG:  EKG no new data ? ?Recent Labs: ?04/25/2021: Magnesium 1.8 ?06/23/2021: B Natriuretic Peptide 1,489.3 ?06/24/2021: Hemoglobin  12.2; Platelets 143 ?07/12/2021: TSH 9.05 ?07/28/2021: ALT 48; BUN 30; Creatinine, Ser 1.36; Potassium 4.0; Pro B Natriuretic peptide (BNP) 3,863.0; Sodium 136  ?Recent Lipid Panel ?   ?Component Value Date/Time

## 2021-08-01 ENCOUNTER — Encounter: Payer: Self-pay | Admitting: Interventional Cardiology

## 2021-08-01 ENCOUNTER — Other Ambulatory Visit (HOSPITAL_COMMUNITY): Payer: Self-pay

## 2021-08-01 ENCOUNTER — Telehealth: Payer: Self-pay | Admitting: Pulmonary Disease

## 2021-08-01 ENCOUNTER — Ambulatory Visit: Payer: Medicare HMO | Admitting: Interventional Cardiology

## 2021-08-01 ENCOUNTER — Other Ambulatory Visit: Payer: Self-pay

## 2021-08-01 VITALS — BP 122/72 | HR 52 | Ht 69.0 in | Wt 133.4 lb

## 2021-08-01 DIAGNOSIS — R54 Age-related physical debility: Secondary | ICD-10-CM

## 2021-08-01 DIAGNOSIS — Z95818 Presence of other cardiac implants and grafts: Secondary | ICD-10-CM

## 2021-08-01 DIAGNOSIS — I471 Supraventricular tachycardia: Secondary | ICD-10-CM

## 2021-08-01 DIAGNOSIS — I10 Essential (primary) hypertension: Secondary | ICD-10-CM | POA: Diagnosis not present

## 2021-08-01 DIAGNOSIS — E785 Hyperlipidemia, unspecified: Secondary | ICD-10-CM

## 2021-08-01 DIAGNOSIS — Z9889 Other specified postprocedural states: Secondary | ICD-10-CM | POA: Diagnosis not present

## 2021-08-01 DIAGNOSIS — I5032 Chronic diastolic (congestive) heart failure: Secondary | ICD-10-CM

## 2021-08-01 MED ORDER — POTASSIUM CHLORIDE CRYS ER 20 MEQ PO TBCR: 10.0000 meq | EXTENDED_RELEASE_TABLET | ORAL | 1 refills | Status: AC

## 2021-08-01 MED ORDER — FUROSEMIDE 40 MG PO TABS: 40.0000 mg | ORAL_TABLET | ORAL | 1 refills | Status: DC

## 2021-08-01 NOTE — Telephone Encounter (Signed)
Received message from pharmacy stating stiolto is not covered with patient's insurance. Patient's insurance will cover anoro or bevepsi.  ? ?JD can you please advise?  ?

## 2021-08-01 NOTE — Telephone Encounter (Signed)
ATC LVMTCB x 1  

## 2021-08-01 NOTE — Patient Instructions (Signed)
Medication Instructions:  ?1) TAKE Furosemide '40mg'$  once daily on Monday, Wednesday and Friday. ?2) TAKE Potassium 32mq once daily on Monday, Wednesday and Friday.  You will take a half tablet of the 229m tablets you have at home. ? ?*If you need a refill on your cardiac medications before your next appointment, please call your pharmacy* ? ? ?Lab Work: ?BMET, Liver, Pro BNP and CBC at your next visit. ? ?If you have labs (blood work) drawn today and your tests are completely normal, you will receive your results only by: ?MyChart Message (if you have MyChart) OR ?A paper copy in the mail ?If you have any lab test that is abnormal or we need to change your treatment, we will call you to review the results. ? ? ?Testing/Procedures: ?None ? ? ?Follow-Up: ?[facility].  As part of our continuing mission to provide you with exceptional heart care, we have created designated Provider Care Teams.  These Care Teams include your primary Cardiologist (physician) and Advanced Practice Providers (APPs -  Physician Assistants and Nurse Practitioners) who all work together to provide you with the care you need, when you need it. ? ?We recommend signing up for the patient portal called "MyChart".  Sign up information is provided on this After Visit Summary.  MyChart is used to connect with patients for Virtual Visits (Telemedicine).  Patients are able to view lab/test results, encounter notes, upcoming appointments, etc.  Non-urgent messages can be sent to your provider as well.   ?To learn more about what you can do with MyChart, go to [email or web address]  ? ?Your next appointment:   ?2 week(s) ? ?The format for your next appointment:   ?In Person ? ?Provider:   ?HeSinclair GroomsMD  ? ? ?Other Instructions ?Weigh daily under the same circumstances.  If weight continues to go up or drops quite a bit, please let me know.  ?

## 2021-08-02 ENCOUNTER — Telehealth: Payer: Self-pay | Admitting: Pulmonary Disease

## 2021-08-02 NOTE — Telephone Encounter (Signed)
Called patient's daughter but she did not answer. Left message for her to call us back.  

## 2021-08-02 NOTE — Telephone Encounter (Signed)
Please let patient know that his pleural effusion labs show that the fluid build up is most likely from his heart failure. No cancerous cells were noted in the fluid.  ? ?I agree with the cardiology team who has started him back on the lasix.  ? ?Thanks, ?JD ? ? ?

## 2021-08-03 MED ORDER — BEVESPI AEROSPHERE 9-4.8 MCG/ACT IN AERO: 2.0000 | INHALATION_SPRAY | Freq: Two times a day (BID) | RESPIRATORY_TRACT | 5 refills | Status: AC

## 2021-08-03 NOTE — Telephone Encounter (Signed)
Adrian Olson returned phone call. She verbalized understanding of the results.  ? ?Nothing further needed at time of call.  ?

## 2021-08-03 NOTE — Telephone Encounter (Signed)
Left message for her to call back

## 2021-08-03 NOTE — Telephone Encounter (Signed)
Called and spoke with patient's daughter Adrian Olson. She verbalized understanding of the new inhaler as well as the instructions. RX has been sent.  ? ?Nothing further needed at time of call.  ?

## 2021-08-03 NOTE — Telephone Encounter (Signed)
Adrian Olson daughter is returning phone call. Adrian Olson phone number is [phone]. ?

## 2021-08-04 DIAGNOSIS — I130 Hypertensive heart and chronic kidney disease with heart failure and stage 1 through stage 4 chronic kidney disease, or unspecified chronic kidney disease: Secondary | ICD-10-CM | POA: Diagnosis not present

## 2021-08-04 DIAGNOSIS — E785 Hyperlipidemia, unspecified: Secondary | ICD-10-CM | POA: Diagnosis not present

## 2021-08-04 DIAGNOSIS — E1122 Type 2 diabetes mellitus with diabetic chronic kidney disease: Secondary | ICD-10-CM | POA: Diagnosis not present

## 2021-08-04 DIAGNOSIS — N1831 Chronic kidney disease, stage 3a: Secondary | ICD-10-CM | POA: Diagnosis not present

## 2021-08-04 DIAGNOSIS — R69 Illness, unspecified: Secondary | ICD-10-CM | POA: Diagnosis not present

## 2021-08-04 DIAGNOSIS — S272XXD Traumatic hemopneumothorax, subsequent encounter: Secondary | ICD-10-CM | POA: Diagnosis not present

## 2021-08-04 DIAGNOSIS — E039 Hypothyroidism, unspecified: Secondary | ICD-10-CM | POA: Diagnosis not present

## 2021-08-04 DIAGNOSIS — S2241XD Multiple fractures of ribs, right side, subsequent encounter for fracture with routine healing: Secondary | ICD-10-CM | POA: Diagnosis not present

## 2021-08-04 DIAGNOSIS — I5033 Acute on chronic diastolic (congestive) heart failure: Secondary | ICD-10-CM | POA: Diagnosis not present

## 2021-08-04 LAB — ANA,IFA RA DIAG PNL W/RFLX TIT/PATN
Anti Nuclear Antibody (ANA): NEGATIVE
Cyclic Citrullin Peptide Ab: <16 UNITS
Rheumatoid fact SerPl-aCnc: <14 IU/mL (ref <14)

## 2021-08-04 LAB — SJOGREN'S SYNDROME ANTIBODS(SSA + SSB)
SSA (Ro) (ENA) Antibody, IgG: <1 AI
SSB (La) (ENA) Antibody, IgG: <1 AI

## 2021-08-04 LAB — ANCA SCREEN W REFLEX TITER: ANCA SCREEN: NEGATIVE

## 2021-08-04 LAB — LACTATE DEHYDROGENASE: LDH: 256 U/L — ABNORMAL HIGH (ref 120–250)

## 2021-08-05 DIAGNOSIS — E1122 Type 2 diabetes mellitus with diabetic chronic kidney disease: Secondary | ICD-10-CM | POA: Diagnosis not present

## 2021-08-05 DIAGNOSIS — N1831 Chronic kidney disease, stage 3a: Secondary | ICD-10-CM | POA: Diagnosis not present

## 2021-08-05 DIAGNOSIS — S272XXD Traumatic hemopneumothorax, subsequent encounter: Secondary | ICD-10-CM | POA: Diagnosis not present

## 2021-08-05 DIAGNOSIS — E039 Hypothyroidism, unspecified: Secondary | ICD-10-CM | POA: Diagnosis not present

## 2021-08-05 DIAGNOSIS — R69 Illness, unspecified: Secondary | ICD-10-CM | POA: Diagnosis not present

## 2021-08-05 DIAGNOSIS — I5033 Acute on chronic diastolic (congestive) heart failure: Secondary | ICD-10-CM | POA: Diagnosis not present

## 2021-08-05 DIAGNOSIS — S2241XD Multiple fractures of ribs, right side, subsequent encounter for fracture with routine healing: Secondary | ICD-10-CM | POA: Diagnosis not present

## 2021-08-05 DIAGNOSIS — I130 Hypertensive heart and chronic kidney disease with heart failure and stage 1 through stage 4 chronic kidney disease, or unspecified chronic kidney disease: Secondary | ICD-10-CM | POA: Diagnosis not present

## 2021-08-05 DIAGNOSIS — E785 Hyperlipidemia, unspecified: Secondary | ICD-10-CM | POA: Diagnosis not present

## 2021-08-06 LAB — GRAM STAIN
GRAM STAIN:: NONE SEEN
MICRO NUMBER:: 13140424
SPECIMEN QUALITY:: ADEQUATE

## 2021-08-06 LAB — GLUCOSE, PLEURAL OR PERITONEAL FLUID: Glucose, Pleural fluid: 163 mg/dL

## 2021-08-06 LAB — PH, BODY FLUID: pH, Fluid: 7.8

## 2021-08-08 ENCOUNTER — Other Ambulatory Visit: Payer: Self-pay

## 2021-08-08 ENCOUNTER — Encounter (HOSPITAL_COMMUNITY): Payer: Self-pay

## 2021-08-08 ENCOUNTER — Inpatient Hospital Stay (HOSPITAL_COMMUNITY)
Admission: EM | Admit: 2021-08-08 | Discharge: 2021-08-14 | DRG: 291 | Disposition: A | Discharge status: Home Health | Payer: Medicare HMO | Attending: Family Medicine | Admitting: Family Medicine

## 2021-08-08 ENCOUNTER — Emergency Department (HOSPITAL_COMMUNITY): Payer: Medicare HMO

## 2021-08-08 DIAGNOSIS — Z79899 Other long term (current) drug therapy: Secondary | ICD-10-CM

## 2021-08-08 DIAGNOSIS — E785 Hyperlipidemia, unspecified: Secondary | ICD-10-CM | POA: Diagnosis not present

## 2021-08-08 DIAGNOSIS — S2241XD Multiple fractures of ribs, right side, subsequent encounter for fracture with routine healing: Secondary | ICD-10-CM | POA: Diagnosis not present

## 2021-08-08 DIAGNOSIS — E860 Dehydration: Secondary | ICD-10-CM | POA: Diagnosis not present

## 2021-08-08 DIAGNOSIS — Z833 Family history of diabetes mellitus: Secondary | ICD-10-CM | POA: Diagnosis not present

## 2021-08-08 DIAGNOSIS — I472 Ventricular tachycardia, unspecified: Secondary | ICD-10-CM | POA: Diagnosis present

## 2021-08-08 DIAGNOSIS — J9811 Atelectasis: Secondary | ICD-10-CM | POA: Diagnosis not present

## 2021-08-08 DIAGNOSIS — I10 Essential (primary) hypertension: Secondary | ICD-10-CM

## 2021-08-08 DIAGNOSIS — L89313 Pressure ulcer of right buttock, stage 3: Secondary | ICD-10-CM | POA: Diagnosis not present

## 2021-08-08 DIAGNOSIS — R69 Illness, unspecified: Secondary | ICD-10-CM | POA: Diagnosis not present

## 2021-08-08 DIAGNOSIS — Z888 Allergy status to other drugs, medicaments and biological substances status: Secondary | ICD-10-CM | POA: Diagnosis not present

## 2021-08-08 DIAGNOSIS — F419 Anxiety disorder, unspecified: Secondary | ICD-10-CM | POA: Diagnosis present

## 2021-08-08 DIAGNOSIS — I5031 Acute diastolic (congestive) heart failure: Secondary | ICD-10-CM

## 2021-08-08 DIAGNOSIS — I214 Non-ST elevation (NSTEMI) myocardial infarction: Principal | ICD-10-CM

## 2021-08-08 DIAGNOSIS — I083 Combined rheumatic disorders of mitral, aortic and tricuspid valves: Secondary | ICD-10-CM | POA: Diagnosis present

## 2021-08-08 DIAGNOSIS — Z8249 Family history of ischemic heart disease and other diseases of the circulatory system: Secondary | ICD-10-CM

## 2021-08-08 DIAGNOSIS — Z7982 Long term (current) use of aspirin: Secondary | ICD-10-CM

## 2021-08-08 DIAGNOSIS — J90 Pleural effusion, not elsewhere classified: Secondary | ICD-10-CM | POA: Diagnosis present

## 2021-08-08 DIAGNOSIS — N400 Enlarged prostate without lower urinary tract symptoms: Secondary | ICD-10-CM | POA: Diagnosis present

## 2021-08-08 DIAGNOSIS — R42 Dizziness and giddiness: Secondary | ICD-10-CM | POA: Diagnosis not present

## 2021-08-08 DIAGNOSIS — R0689 Other abnormalities of breathing: Secondary | ICD-10-CM | POA: Diagnosis not present

## 2021-08-08 DIAGNOSIS — Z7984 Long term (current) use of oral hypoglycemic drugs: Secondary | ICD-10-CM

## 2021-08-08 DIAGNOSIS — Z95818 Presence of other cardiac implants and grafts: Secondary | ICD-10-CM

## 2021-08-08 DIAGNOSIS — E876 Hypokalemia: Secondary | ICD-10-CM | POA: Diagnosis present

## 2021-08-08 DIAGNOSIS — E1169 Type 2 diabetes mellitus with other specified complication: Secondary | ICD-10-CM | POA: Diagnosis not present

## 2021-08-08 DIAGNOSIS — I252 Old myocardial infarction: Secondary | ICD-10-CM

## 2021-08-08 DIAGNOSIS — Z681 Body mass index (BMI) 19 or less, adult: Secondary | ICD-10-CM | POA: Diagnosis not present

## 2021-08-08 DIAGNOSIS — Z79890 Hormone replacement therapy (postmenopausal): Secondary | ICD-10-CM

## 2021-08-08 DIAGNOSIS — Z9889 Other specified postprocedural states: Secondary | ICD-10-CM

## 2021-08-08 DIAGNOSIS — I2510 Atherosclerotic heart disease of native coronary artery without angina pectoris: Secondary | ICD-10-CM | POA: Diagnosis present

## 2021-08-08 DIAGNOSIS — F32A Depression, unspecified: Secondary | ICD-10-CM | POA: Diagnosis present

## 2021-08-08 DIAGNOSIS — E039 Hypothyroidism, unspecified: Secondary | ICD-10-CM | POA: Diagnosis not present

## 2021-08-08 DIAGNOSIS — I130 Hypertensive heart and chronic kidney disease with heart failure and stage 1 through stage 4 chronic kidney disease, or unspecified chronic kidney disease: Secondary | ICD-10-CM | POA: Diagnosis not present

## 2021-08-08 DIAGNOSIS — S272XXD Traumatic hemopneumothorax, subsequent encounter: Secondary | ICD-10-CM | POA: Diagnosis not present

## 2021-08-08 DIAGNOSIS — I248 Other forms of acute ischemic heart disease: Secondary | ICD-10-CM | POA: Diagnosis not present

## 2021-08-08 DIAGNOSIS — L97519 Non-pressure chronic ulcer of other part of right foot with unspecified severity: Secondary | ICD-10-CM | POA: Diagnosis present

## 2021-08-08 DIAGNOSIS — E1122 Type 2 diabetes mellitus with diabetic chronic kidney disease: Secondary | ICD-10-CM | POA: Diagnosis present

## 2021-08-08 DIAGNOSIS — J9601 Acute respiratory failure with hypoxia: Secondary | ICD-10-CM | POA: Diagnosis not present

## 2021-08-08 DIAGNOSIS — N1831 Chronic kidney disease, stage 3a: Secondary | ICD-10-CM | POA: Diagnosis not present

## 2021-08-08 DIAGNOSIS — Z743 Need for continuous supervision: Secondary | ICD-10-CM | POA: Diagnosis not present

## 2021-08-08 DIAGNOSIS — N1832 Chronic kidney disease, stage 3b: Secondary | ICD-10-CM | POA: Diagnosis not present

## 2021-08-08 DIAGNOSIS — E43 Unspecified severe protein-calorie malnutrition: Secondary | ICD-10-CM | POA: Diagnosis present

## 2021-08-08 DIAGNOSIS — R0602 Shortness of breath: Secondary | ICD-10-CM | POA: Diagnosis not present

## 2021-08-08 DIAGNOSIS — Z9181 History of falling: Secondary | ICD-10-CM | POA: Diagnosis not present

## 2021-08-08 DIAGNOSIS — I5033 Acute on chronic diastolic (congestive) heart failure: Secondary | ICD-10-CM | POA: Diagnosis present

## 2021-08-08 DIAGNOSIS — R739 Hyperglycemia, unspecified: Secondary | ICD-10-CM | POA: Diagnosis not present

## 2021-08-08 DIAGNOSIS — E11621 Type 2 diabetes mellitus with foot ulcer: Secondary | ICD-10-CM | POA: Diagnosis present

## 2021-08-08 DIAGNOSIS — I509 Heart failure, unspecified: Secondary | ICD-10-CM

## 2021-08-08 DIAGNOSIS — I493 Ventricular premature depolarization: Secondary | ICD-10-CM | POA: Diagnosis present

## 2021-08-08 DIAGNOSIS — R531 Weakness: Secondary | ICD-10-CM | POA: Diagnosis not present

## 2021-08-08 DIAGNOSIS — M79674 Pain in right toe(s): Secondary | ICD-10-CM | POA: Diagnosis not present

## 2021-08-08 LAB — URINALYSIS, ROUTINE W REFLEX MICROSCOPIC
Bacteria, UA: NONE SEEN
Bilirubin Urine: NEGATIVE
Glucose, UA: NEGATIVE mg/dL
Hgb urine dipstick: NEGATIVE
Ketones, ur: NEGATIVE mg/dL
Leukocytes,Ua: NEGATIVE
Nitrite: NEGATIVE
Protein, ur: 30 mg/dL — AB
Specific Gravity, Urine: 1.014 (ref 1.005–1.030)
pH: 6 (ref 5.0–8.0)

## 2021-08-08 LAB — COMPREHENSIVE METABOLIC PANEL
ALT: 47 U/L — ABNORMAL HIGH (ref 0–44)
AST: 63 U/L — ABNORMAL HIGH (ref 15–41)
Albumin: 3.8 g/dL (ref 3.5–5.0)
Alkaline Phosphatase: 77 U/L (ref 38–126)
Anion gap: 13 (ref 5–15)
BUN: 32 mg/dL — ABNORMAL HIGH (ref 8–23)
CO2: 20 mmol/L — ABNORMAL LOW (ref 22–32)
Calcium: 9 mg/dL (ref 8.9–10.3)
Chloride: 97 mmol/L — ABNORMAL LOW (ref 98–111)
Creatinine, Ser: 1.33 mg/dL — ABNORMAL HIGH (ref 0.61–1.24)
GFR, Estimated: 51 mL/min — ABNORMAL LOW (ref >60)
Glucose, Bld: 210 mg/dL — ABNORMAL HIGH (ref 70–99)
Potassium: 4.2 mmol/L (ref 3.5–5.1)
Sodium: 130 mmol/L — ABNORMAL LOW (ref 135–145)
Total Bilirubin: 0.8 mg/dL (ref 0.3–1.2)
Total Protein: 7.3 g/dL (ref 6.5–8.1)

## 2021-08-08 LAB — CBC
HCT: 42.7 % (ref 39.0–52.0)
HCT: 39.8 % (ref 39.0–52.0)
Hemoglobin: 14.2 g/dL (ref 13.0–17.0)
Hemoglobin: 12.9 g/dL — ABNORMAL LOW (ref 13.0–17.0)
MCH: 30.7 pg (ref 26.0–34.0)
MCH: 30.2 pg (ref 26.0–34.0)
MCHC: 33.3 g/dL (ref 30.0–36.0)
MCHC: 32.4 g/dL (ref 30.0–36.0)
MCV: 92.4 fL (ref 80.0–100.0)
MCV: 93.2 fL (ref 80.0–100.0)
Platelets: 169 10*3/uL (ref 150–400)
Platelets: 146 10*3/uL — ABNORMAL LOW (ref 150–400)
RBC: 4.62 MIL/uL (ref 4.22–5.81)
RBC: 4.27 MIL/uL (ref 4.22–5.81)
RDW: 16.9 % — ABNORMAL HIGH (ref 11.5–15.5)
RDW: 17 % — ABNORMAL HIGH (ref 11.5–15.5)
WBC: 7.1 10*3/uL (ref 4.0–10.5)
WBC: 7.3 10*3/uL (ref 4.0–10.5)
nRBC: 0 % (ref 0.0–0.2)
nRBC: 0 % (ref 0.0–0.2)

## 2021-08-08 LAB — TSH: TSH: 4.998 u[IU]/mL — ABNORMAL HIGH (ref 0.350–4.500)

## 2021-08-08 LAB — CBG MONITORING, ED: Glucose-Capillary: 190 mg/dL — ABNORMAL HIGH (ref 70–99)

## 2021-08-08 LAB — TROPONIN I (HIGH SENSITIVITY)
Troponin I (High Sensitivity): 268 ng/L (ref <18)
Troponin I (High Sensitivity): 344 ng/L (ref <18)
Troponin I (High Sensitivity): 386 ng/L (ref <18)
Troponin I (High Sensitivity): 402 ng/L (ref <18)

## 2021-08-08 LAB — BRAIN NATRIURETIC PEPTIDE: B Natriuretic Peptide: 2976.1 pg/mL — ABNORMAL HIGH (ref 0.0–100.0)

## 2021-08-08 LAB — MAGNESIUM: Magnesium: 1.7 mg/dL (ref 1.7–2.4)

## 2021-08-08 LAB — CREATININE, SERUM
Creatinine, Ser: 1.39 mg/dL — ABNORMAL HIGH (ref 0.61–1.24)
GFR, Estimated: 49 mL/min — ABNORMAL LOW (ref >60)

## 2021-08-08 LAB — GLUCOSE, CAPILLARY: Glucose-Capillary: 218 mg/dL — ABNORMAL HIGH (ref 70–99)

## 2021-08-08 MED ORDER — METOPROLOL TARTRATE 25 MG PO TABS
12.5000 mg | ORAL_TABLET | Freq: Two times a day (BID) | ORAL | Status: DC
  Administered 2021-08-08: 12.5 mg via ORAL
  Filled 2021-08-08 (×2): qty 1

## 2021-08-08 MED ORDER — ACETAMINOPHEN 650 MG RE SUPP
650.0000 mg | Freq: Four times a day (QID) | RECTAL | Status: DC | PRN
Start: 2021-08-08 — End: 2021-08-14

## 2021-08-08 MED ORDER — SERTRALINE HCL 50 MG PO TABS
50.0000 mg | ORAL_TABLET | Freq: Every day | ORAL | Status: DC
Start: 2021-08-09 — End: 2021-08-14
  Administered 2021-08-09 – 2021-08-14 (×6): 50 mg via ORAL
  Filled 2021-08-08 (×6): qty 1

## 2021-08-08 MED ORDER — POTASSIUM CHLORIDE CRYS ER 10 MEQ PO TBCR: 10.0000 meq | EXTENDED_RELEASE_TABLET | ORAL | Status: DC

## 2021-08-08 MED ORDER — PRAVASTATIN SODIUM 20 MG PO TABS
20.0000 mg | ORAL_TABLET | Freq: Every day | ORAL | Status: DC
Start: 2021-08-08 — End: 2021-08-14
  Administered 2021-08-08 – 2021-08-13 (×6): 20 mg via ORAL
  Filled 2021-08-08 (×6): qty 1

## 2021-08-08 MED ORDER — ENOXAPARIN SODIUM 40 MG/0.4ML IJ SOSY
40.0000 mg | PREFILLED_SYRINGE | INTRAMUSCULAR | Status: DC
  Administered 2021-08-08 – 2021-08-13 (×6): 40 mg via SUBCUTANEOUS
  Filled 2021-08-08 (×6): qty 0.4

## 2021-08-08 MED ORDER — ACETAMINOPHEN 325 MG PO TABS
650.0000 mg | ORAL_TABLET | Freq: Four times a day (QID) | ORAL | Status: DC | PRN
  Administered 2021-08-10: 650 mg via ORAL

## 2021-08-08 MED ORDER — FUROSEMIDE 10 MG/ML IJ SOLN
40.0000 mg | Freq: Two times a day (BID) | INTRAMUSCULAR | Status: AC
  Administered 2021-08-09 – 2021-08-11 (×6): 40 mg via INTRAVENOUS
  Filled 2021-08-08 (×6): qty 4

## 2021-08-08 MED ORDER — FINASTERIDE 5 MG PO TABS
5.0000 mg | ORAL_TABLET | Freq: Every day | ORAL | Status: DC
Start: 2021-08-09 — End: 2021-08-14
  Administered 2021-08-09 – 2021-08-14 (×6): 5 mg via ORAL
  Filled 2021-08-08 (×6): qty 1

## 2021-08-08 MED ORDER — ALBUTEROL SULFATE (2.5 MG/3ML) 0.083% IN NEBU: 2.5000 mg | INHALATION_SOLUTION | Freq: Four times a day (QID) | RESPIRATORY_TRACT | Status: DC | PRN

## 2021-08-08 MED ORDER — LEVOTHYROXINE SODIUM 100 MCG PO TABS
100.0000 ug | ORAL_TABLET | Freq: Every day | ORAL | Status: DC
  Administered 2021-08-09 – 2021-08-14 (×6): 100 ug via ORAL
  Filled 2021-08-08 (×6): qty 1

## 2021-08-08 MED ORDER — INSULIN ASPART 100 UNIT/ML IJ SOLN
0.0000 [IU] | Freq: Three times a day (TID) | INTRAMUSCULAR | Status: DC
  Administered 2021-08-09: 2 [IU] via SUBCUTANEOUS
  Administered 2021-08-09: 1 [IU] via SUBCUTANEOUS
  Administered 2021-08-09 – 2021-08-10 (×2): 2 [IU] via SUBCUTANEOUS
  Administered 2021-08-10: 1 [IU] via SUBCUTANEOUS
  Administered 2021-08-11 (×2): 2 [IU] via SUBCUTANEOUS
  Administered 2021-08-11: 1 [IU] via SUBCUTANEOUS
  Administered 2021-08-12 – 2021-08-14 (×5): 2 [IU] via SUBCUTANEOUS

## 2021-08-08 MED ORDER — RAMIPRIL 5 MG PO CAPS
5.0000 mg | ORAL_CAPSULE | Freq: Every day | ORAL | Status: DC
  Administered 2021-08-11 – 2021-08-14 (×3): 5 mg via ORAL
  Filled 2021-08-08 (×6): qty 1

## 2021-08-08 MED ORDER — TAMSULOSIN HCL 0.4 MG PO CAPS
0.4000 mg | ORAL_CAPSULE | Freq: Every day | ORAL | Status: DC
  Administered 2021-08-09 – 2021-08-14 (×6): 0.4 mg via ORAL
  Filled 2021-08-08 (×6): qty 1

## 2021-08-08 MED ORDER — FUROSEMIDE 10 MG/ML IJ SOLN
40.0000 mg | Freq: Once | INTRAMUSCULAR | Status: AC
  Administered 2021-08-08: 40 mg via INTRAVENOUS
  Filled 2021-08-08: qty 4

## 2021-08-08 MED ORDER — ASPIRIN 325 MG PO TABS
325.0000 mg | ORAL_TABLET | Freq: Every day | ORAL | Status: DC
  Administered 2021-08-08 – 2021-08-10 (×3): 325 mg via ORAL
  Filled 2021-08-08 (×3): qty 1

## 2021-08-08 NOTE — H&P (Addendum)
?History and Physical  ? ? ?Adrian Olson FYB:[id number] DOB: March 22, 1931 DOA: 08/08/2021 ? ?PCP: Merrilee Seashore, MD  ?Patient coming from: Home. ? ?Chief Complaint: Shortness of breath and fatigue. ? ?HPI: Adrian Olson is a 88 y.o. male with history of diastolic CHF with significant mitral regurgitation despite clipping presents to the ER with worsening shortness of breath with fatigue over the last few days.  Patient had recently followed up with patient's cardiologist Dr. Tamala Julian and patient's Lasix was reintroduced.  Patient has noted increasing swelling of lower extremities and shortness of breath on exertion.  Denies any nausea vomiting abdominal pain chest pain or productive cough. ? ?ED Course: In the ER labs show BNP of 2900 I sensitive troponin was 268 and 344.  Creatinine 1.3 sodium 130.  Blood glucose was 210.  Chest x-ray shows congestion with pleural effusion.  Patient was started on Lasix.  Cardiology was notified and they advised to continue with diuretics and they will see patient in consult. ? ?Review of Systems: As per HPI, rest all negative. ? ? ?Past Medical History:  ?Diagnosis Date  ? Bilateral carotid artery disease ([place])   ? Severe Left and moderate Right  ? Diabetes mellitus without complication ([place])   ? ED (erectile dysfunction)   ? Hyperlipidemia   ? Hypertension   ? Lumbar disc disease   ? Mitral valve prolapse   ? moderate to severe MR, LVEF 55% Echo 2010  ? Moderate mitral regurgitation 03/16/2014  ? S/P mitral valve clip implantation 10/09/2019  ? s/p TEER using 2 MitraClip XTW devices, first device positioned A2 P2, second device positioned A1 P1 by Dr. Burt Knack  ? ? ?Past Surgical History:  ?Procedure Laterality Date  ? CARDIAC CATHETERIZATION    ? MITRAL VALVE REPAIR  10/09/2019  ? MITRAL VALVE REPAIR (N/A )  ? MITRAL VALVE REPAIR N/A 10/09/2019  ? Procedure: MITRAL VALVE REPAIR;  Surgeon: Sherren Mocha, MD;  Location: [place] [facility];  Service: Cardiovascular;  Laterality:  N/A;  ? RIGHT/LEFT HEART CATH AND CORONARY ANGIOGRAPHY N/A 09/16/2019  ? Procedure: RIGHT/LEFT HEART CATH AND CORONARY ANGIOGRAPHY;  Surgeon: Belva Crome, MD;  Location: [place] [facility];  Service: Cardiovascular;  Laterality: N/A;  ? TEE WITHOUT CARDIOVERSION N/A 09/16/2019  ? Procedure: TRANSESOPHAGEAL ECHOCARDIOGRAM (TEE);  Surgeon: Lelon Perla, MD;  Location: [facility] ENDOSCOPY;  Service: Cardiovascular;  Laterality: N/A;  ? ? ? reports that he has never smoked. He has never used smokeless tobacco. He reports that he does not drink alcohol and does not use drugs. ? ?Allergies  ?Allergen Reactions  ? Duloxetine Hcl Other (See Comments)  ?  withdrawal  ? ? ?Family History  ?Problem Relation Age of Onset  ? Cancer Mother   ?     ovarian  ? Diabetes Father   ? Other Father   ?     enlarged prostate  ? Stroke Father   ? Heart disease Father   ? Heart disease Sister   ? Obesity Sister   ? Dementia Sister   ? Diabetes Sister   ? Other Sister   ?     nornmal old age problems  ? ? ?Prior to Admission medications   ?Medication Sig Start Date End Date Taking? Authorizing Provider  ?albuterol (VENTOLIN HFA) 108 (90 Base) MCG/ACT inhaler Inhale 2 puffs into the lungs every 6 (six) hours as needed for wheezing or shortness of breath. 06/21/21   Clayton Bibles, NP  ?aspirin 81  MG EC tablet Take 81 mg by mouth daily.    [provider]  ?cetirizine (ZYRTEC ALLERGY) 10 MG tablet Take 1 tablet (10 mg total) by mouth daily. 06/21/21   Clayton Bibles, NP  ?Cholecalciferol (VITAMIN D) 2000 UNITS tablet Take 2,000 Units by mouth daily at 12 noon.     [provider]  ?Cyanocobalamin (VITAMIN B-12) 2000 MCG TBCR 1 tablet    [provider]  ?finasteride (PROSCAR) 5 MG tablet Take 5 mg by mouth daily at 12 noon.     [provider]  ?fluticasone (FLONASE) 50 MCG/ACT nasal spray Place 2 sprays into both nostrils daily. 06/21/21   Clayton Bibles, NP  ?furosemide (LASIX) 40 MG tablet Take 1 tablet  (40 mg total) by mouth every Monday, Wednesday, and Friday. 08/01/21   Belva Crome, MD  ?Glycopyrrolate-Formoterol (BEVESPI AEROSPHERE) 9-4.8 MCG/ACT AERO Inhale 2 puffs into the lungs 2 (two) times daily. 08/03/21   Freddi Starr, MD  ?levothyroxine (SYNTHROID) 100 MCG tablet Take 1 tablet by mouth daily. 07/22/21   [provider]  ?metFORMIN (GLUCOPHAGE) 500 MG tablet Take 500 mg by mouth every evening.  04/23/16   [provider]  ?metoprolol tartrate (LOPRESSOR) 25 MG tablet Take 0.5 tablets (12.5 mg total) by mouth 2 (two) times daily. 05/25/21 07/05/21  Deberah Pelton, NP  ?ONETOUCH VERIO test strip See admin instructions. 07/07/21   [provider]  ?potassium chloride SA (KLOR-CON M) 20 MEQ tablet Take 0.5 tablets (10 mEq total) by mouth every Monday, Wednesday, and Friday. 08/01/21   Belva Crome, MD  ?pravastatin (PRAVACHOL) 20 MG tablet Take 20 mg by mouth at bedtime.     [provider]  ?ramipril (ALTACE) 5 MG capsule Take 5 mg by mouth daily.     [provider]  ?sertraline (ZOLOFT) 50 MG tablet Take 50 mg by mouth daily. 07/07/21   [provider]  ?sodium chloride (OCEAN) 0.65 % SOLN nasal spray Place 1 spray into both nostrils daily as needed for congestion.    [provider]  ?tamsulosin (FLOMAX) 0.4 MG CAPS capsule Take 0.4 mg by mouth daily at 12 noon.  04/13/18   [provider]  ?Thiamine HCl (VITAMIN B1 PO) Take 125 mcg by mouth daily.    [provider]  ? ? ?Physical Exam: ?Constitutional: Moderately built and nourished. ?Vitals:  ? 08/08/21 1845 08/08/21 1900 08/08/21 2000 08/08/21 2051  ?BP:  (!) 127/92 136/78 117/72  ?Pulse: (!) 107 (!) 42 (!) 36 77  ?Resp:  15 (!) 34 16  ?Temp:    (!) 97.4 ?F (36.3 ?C)  ?TempSrc:    Oral  ?SpO2:  97% 100% 98%  ?Weight:    59.1 kg  ?Height:    '5\' 9"'$  (1.753 m)  ? ?Eyes: Anicteric no pallor. ?ENMT: No discharge from the ears eyes nose and mouth. ?Neck: JVD elevated no  mass felt. ?Respiratory: No rhonchi or crepitations. ?Cardiovascular: S1-S2 heard. ?Abdomen: Soft nontender bowel sound present. ?Musculoskeletal: Bilateral lower extremity edema present. ?Skin: No rash. ?Neurologic: Alert awake oriented time place and person.  Moves all extremities. ?Psychiatric: Appears normal.  Normal affect. ? ? ?Labs on Admission: I have personally reviewed following labs and imaging studies ? ?CBC: ?Recent Labs  ?Lab 08/08/21 ?1625  ?WBC 7.1  ?HGB 14.2  ?HCT 42.7  ?MCV 92.4  ?PLT 169  ? ?Basic Metabolic Panel: ?Recent Labs  ?Lab 08/08/21 ?1625  ?NA 130*  ?K  4.2  ?CL 97*  ?CO2 20*  ?GLUCOSE 210*  ?BUN 32*  ?CREATININE 1.33*  ?CALCIUM 9.0  ? ?GFR: ?Estimated Creatinine Clearance: 32.1 mL/min (A) (by C-G formula based on SCr of 1.33 mg/dL (H)). ?Liver Function Tests: ?Recent Labs  ?Lab 08/08/21 ?1625  ?AST 63*  ?ALT 47*  ?ALKPHOS 77  ?BILITOT 0.8  ?PROT 7.3  ?ALBUMIN 3.8  ? ?No results for input(s): LIPASE, AMYLASE in the last 168 hours. ?No results for input(s): AMMONIA in the last 168 hours. ?Coagulation Profile: ?No results for input(s): INR, PROTIME in the last 168 hours. ?Cardiac Enzymes: ?No results for input(s): CKTOTAL, CKMB, CKMBINDEX, TROPONINI in the last 168 hours. ?BNP (last 3 results) ?Recent Labs  ?  07/28/21 ?1219  ?PROBNP 3,863.0*  ? ?HbA1C: ?No results for input(s): HGBA1C in the last 72 hours. ?CBG: ?Recent Labs  ?Lab 08/08/21 ?1722  ?GLUCAP 190*  ? ?Lipid Profile: ?No results for input(s): CHOL, HDL, LDLCALC, TRIG, CHOLHDL, LDLDIRECT in the last 72 hours. ?Thyroid Function Tests: ?No results for input(s): TSH, T4TOTAL, FREET4, T3FREE, THYROIDAB in the last 72 hours. ?Anemia Panel: ?No results for input(s): VITAMINB12, FOLATE, FERRITIN, TIBC, IRON, RETICCTPCT in the last 72 hours. ?Urine analysis: ?   ?Component Value Date/Time  ? [place] YELLOW 08/08/2021 1723  ? APPEARANCEUR HAZY (A) 08/08/2021 1723  ? LABSPEC 1.014 08/08/2021 1723  ? PHURINE 6.0 08/08/2021 1723  ?  GLUCOSEU NEGATIVE 08/08/2021 1723  ? [place] NEGATIVE 08/08/2021 1723  ? [place] NEGATIVE 08/08/2021 1723  ? [place] NEGATIVE 08/08/2021 1723  ? PROTEINUR 30 (A) 08/08/2021 1723  ? NITRITE NEGATIVE 03/27/202

## 2021-08-08 NOTE — ED Provider Triage Note (Signed)
Emergency Medicine Provider Triage Evaluation Note ? ?Adrian Olson , a 88 y.o. male  was evaluated in triage.  Pt complains of shortness of breath and generalized weakness.  The patient states that for the past two days he has felt very weak. He has had a hard time catching his breath when performing simple tasks. Family reports a decline in health over the past 6 months.  ? ?Review of Systems  ?Positive: Shortness of breath, weakness, diarrhea, pressure ulcers ?Negative: Chest pain, nausea ? ?Physical Exam  ?BP 116/68 (BP Location: Left Arm)   Pulse 70   Temp 97.9 ?F (36.6 ?C) (Oral)   Resp 18   SpO2 98%  ?Gen:   Awake, no distress   ?Resp:  Normal effort  ?MSK:   Moves extremities without difficulty  ?Other:   ? ?Medical Decision Making  ?Medically screening exam initiated at 3:59 PM.  Appropriate orders placed.  Adrian Olson was informed that the remainder of the evaluation will be completed by another provider, this initial triage assessment does not replace that evaluation, and the importance of remaining in the ED until their evaluation is complete. ? ?Recommended to nursing to expedite transition to room if possible  ?  ?Dorothyann Peng, PA-C ?08/08/21 1609 ? ?

## 2021-08-08 NOTE — ED Notes (Signed)
Critical Lab  ?Troponin 268  ?Reported to M Kommor ?

## 2021-08-08 NOTE — ED Notes (Signed)
[place] Jim Daughter [phone]  ?

## 2021-08-08 NOTE — ED Provider Notes (Signed)
?[facility] DEPT ?Provider Note ? ?CSN: [id number] ?Arrival date & time: 08/08/21 1526 ? ?Chief Complaint(s) ?Weakness and Shortness of Breath ? ?HPI ?Adrian Olson is a 88 y.o. male with PMH degenerative mitral valve disease with severe mitral regurg status post mitral clip but unfortunately with persistent moderate to severe mitral regurg, bilateral carotid artery stenosis, HTN who presents the emergency department for evaluation of weakness and shortness of breath.  Patient states he has been in and out of the hospital frequently over the last few months for progressive weakness and shortness of breath.  Last seen by pulmonology on 07/28/2021 with large right pleural effusion drained.  Patient currently on Lasix every other day.  States that he was unable to get out of the bathroom today due to severe shortness of breath.  Denies chest pain, abdominal pain, nausea, vomiting or other systemic symptoms. ? ? ?Weakness ?Associated symptoms: shortness of breath   ?Shortness of Breath ? ?Past Medical History ?Past Medical History:  ?Diagnosis Date  ? Bilateral carotid artery disease ([place])   ? Severe Left and moderate Right  ? Diabetes mellitus without complication ([place])   ? ED (erectile dysfunction)   ? Hyperlipidemia   ? Hypertension   ? Lumbar disc disease   ? Mitral valve prolapse   ? moderate to severe MR, LVEF 55% Echo 2010  ? Moderate mitral regurgitation 03/16/2014  ? S/P mitral valve clip implantation 10/09/2019  ? s/p TEER using 2 MitraClip XTW devices, first device positioned A2 P2, second device positioned A1 P1 by Dr. Burt Knack  ? ?Patient Active Problem List  ? Diagnosis Date Noted  ? Stage 3b chronic kidney disease (CKD) ([place]) 06/24/2021  ? CHF (congestive heart failure) ([place]) 06/23/2021  ? LFT elevation   ? DOE (dyspnea on exertion) 06/21/2021  ? Acute rhinitis 06/21/2021  ? Pneumonia 06/14/2021  ? Fatigue 05/18/2021  ? Type 2 diabetes mellitus with hyperlipidemia ([place])  05/09/2021  ? BPH (benign prostatic hyperplasia) 05/09/2021  ? Anxiety 05/09/2021  ? Depression 05/09/2021  ? Loculated pleural effusion 05/03/2021  ? Multiple closed fractures of ribs of right side   ? Recurrent pleural effusion 04/24/2021  ? Orthostatic hypotension 04/24/2021  ? Transaminitis 04/24/2021  ? Hypothyroidism 04/24/2021  ? Acute respiratory failure with hypoxia ([place]) 04/24/2021  ? Episode of unresponsiveness 04/24/2021  ? Chronic diastolic (congestive) heart failure ([place]) 10/10/2019  ? S/P mitral valve clip implantation 10/09/2019  ? Mitral prolapse 03/16/2014  ? Nonrheumatic mitral (valve) insufficiency 03/16/2014  ? Bilateral carotid artery disease ([place]) 03/16/2014  ? Essential hypertension 03/16/2014  ? Hyperlipidemia 03/16/2014  ? Erectile dysfunction 03/16/2014  ? ?Home Medication(s) ?Prior to Admission medications   ?Medication Sig Start Date End Date Taking? Authorizing Provider  ?albuterol (VENTOLIN HFA) 108 (90 Base) MCG/ACT inhaler Inhale 2 puffs into the lungs every 6 (six) hours as needed for wheezing or shortness of breath. 06/21/21   Cobb, Karie Schwalbe, NP  ?aspirin 81 MG EC tablet Take 81 mg by mouth daily.    [provider]  ?cetirizine (ZYRTEC ALLERGY) 10 MG tablet Take 1 tablet (10 mg total) by mouth daily. 06/21/21   Clayton Bibles, NP  ?Cholecalciferol (VITAMIN D) 2000 UNITS tablet Take 2,000 Units by mouth daily at 12 noon.     [provider]  ?Cyanocobalamin (VITAMIN B-12) 2000 MCG TBCR 1 tablet    [provider]  ?finasteride (PROSCAR) 5 MG tablet Take 5 mg by mouth daily at 12 noon.  [provider]  ?fluticasone (FLONASE) 50 MCG/ACT nasal spray Place 2 sprays into both nostrils daily. 06/21/21   Clayton Bibles, NP  ?furosemide (LASIX) 40 MG tablet Take 1 tablet (40 mg total) by mouth every Monday, Wednesday, and Friday. 08/01/21   Belva Crome, MD  ?Glycopyrrolate-Formoterol (BEVESPI AEROSPHERE) 9-4.8 MCG/ACT AERO Inhale 2 puffs into  the lungs 2 (two) times daily. 08/03/21   Freddi Starr, MD  ?levothyroxine (SYNTHROID) 100 MCG tablet Take 1 tablet by mouth daily. 07/22/21   [provider]  ?metFORMIN (GLUCOPHAGE) 500 MG tablet Take 500 mg by mouth every evening.  04/23/16   [provider]  ?metoprolol tartrate (LOPRESSOR) 25 MG tablet Take 0.5 tablets (12.5 mg total) by mouth 2 (two) times daily. 05/25/21 07/05/21  Deberah Pelton, NP  ?ONETOUCH VERIO test strip See admin instructions. 07/07/21   [provider]  ?potassium chloride SA (KLOR-CON M) 20 MEQ tablet Take 0.5 tablets (10 mEq total) by mouth every Monday, Wednesday, and Friday. 08/01/21   Belva Crome, MD  ?pravastatin (PRAVACHOL) 20 MG tablet Take 20 mg by mouth at bedtime.     [provider]  ?ramipril (ALTACE) 5 MG capsule Take 5 mg by mouth daily.     [provider]  ?sertraline (ZOLOFT) 50 MG tablet Take 50 mg by mouth daily. 07/07/21   [provider]  ?sodium chloride (OCEAN) 0.65 % SOLN nasal spray Place 1 spray into both nostrils daily as needed for congestion.    [provider]  ?tamsulosin (FLOMAX) 0.4 MG CAPS capsule Take 0.4 mg by mouth daily at 12 noon.  04/13/18   [provider]  ?Thiamine HCl (VITAMIN B1 PO) Take 125 mcg by mouth daily.    [provider]  ?                                                                                                                                  ?Past Surgical History ?Past Surgical History:  ?Procedure Laterality Date  ? CARDIAC CATHETERIZATION    ? MITRAL VALVE REPAIR  10/09/2019  ? MITRAL VALVE REPAIR (N/A )  ? MITRAL VALVE REPAIR N/A 10/09/2019  ? Procedure: MITRAL VALVE REPAIR;  Surgeon: Sherren Mocha, MD;  Location: [facility];  Service: Cardiovascular;  Laterality: N/A;  ? RIGHT/LEFT HEART CATH AND CORONARY ANGIOGRAPHY N/A 09/16/2019  ? Procedure: RIGHT/LEFT HEART CATH AND CORONARY ANGIOGRAPHY;  Surgeon: Belva Crome, MD;   Location: [facility];  Service: Cardiovascular;  Laterality: N/A;  ? TEE WITHOUT CARDIOVERSION N/A 09/16/2019  ? Procedure: TRANSESOPHAGEAL ECHOCARDIOGRAM (TEE);  Surgeon: Lelon Perla, MD;  Location: [facility] END[facility];  Service: Cardiovascular;  Laterality: N/A;  ? ?Family History ?Family History  ?Problem Relation Age of Onset  ? Cancer Mother   ?     ovarian  ? Diabetes Father   ? Other Father   ?  enlarged prostate  ? Stroke Father   ? Heart disease Father   ? Heart disease Sister   ? Obesity Sister   ? Dementia Sister   ? Diabetes Sister   ? Other Sister   ?     nornmal old age problems  ? ? ?Social History ?Social History  ? ?Tobacco Use  ? Smoking status: Never  ? Smokeless tobacco: Never  ?Vaping Use  ? Vaping Use: Never used  ?Substance Use Topics  ? Alcohol use: No  ?  Alcohol/week: 0.0 standard drinks  ? Drug use: No  ? ?Allergies ?Duloxetine hcl ? ?Review of Systems ?Review of Systems  ?Respiratory:  Positive for shortness of breath.   ?Neurological:  Positive for weakness.  ? ?Physical Exam ?Vital Signs  ?I have reviewed the triage vital signs ?BP 116/68 (BP Location: Left Arm)   Pulse 70   Temp 97.9 ?F (36.6 ?C) (Oral)   Resp 18   SpO2 98%  ? ?Physical Exam ?Vitals and nursing note reviewed.  ?Constitutional:   ?   General: He is not in acute distress. ?   Appearance: He is well-developed.  ?HENT:  ?   Head: Normocephalic and atraumatic.  ?Eyes:  ?   Conjunctiva/sclera: Conjunctivae normal.  ?Cardiovascular:  ?   Rate and Rhythm: Normal rate and regular rhythm.  ?   Heart sounds: No murmur heard. ?Pulmonary:  ?   Effort: Pulmonary effort is normal. No respiratory distress.  ?   Breath sounds: Examination of the right-middle field reveals decreased breath sounds. Examination of the right-lower field reveals decreased breath sounds. Decreased breath sounds present.  ?Abdominal:  ?   Palpations: Abdomen is soft.  ?   Tenderness: There is no abdominal tenderness.  ?Musculoskeletal:     ?    General: No swelling.  ?   Cervical back: Neck supple.  ?   Right lower leg: Edema present.  ?   Left lower leg: Edema present.  ?Skin: ?   General: Skin is warm and dry.  ?   Capillary Refill: Capillary refill takes less

## 2021-08-08 NOTE — ED Triage Notes (Addendum)
Per EMS- Patient reports increased SOB and weakness x 2 days. Family reports that the patient has been in declining health x 6 months ? ? ?Patient added "It just wears me out to walk and I feel so weak." ?

## 2021-08-08 NOTE — ED Notes (Signed)
Pt is AxO x 4, uses urinal independently, denies CP at this time ?

## 2021-08-09 DIAGNOSIS — I5031 Acute diastolic (congestive) heart failure: Secondary | ICD-10-CM | POA: Diagnosis not present

## 2021-08-09 DIAGNOSIS — I214 Non-ST elevation (NSTEMI) myocardial infarction: Principal | ICD-10-CM

## 2021-08-09 DIAGNOSIS — Z9181 History of falling: Secondary | ICD-10-CM | POA: Diagnosis not present

## 2021-08-09 DIAGNOSIS — I5033 Acute on chronic diastolic (congestive) heart failure: Secondary | ICD-10-CM | POA: Diagnosis not present

## 2021-08-09 DIAGNOSIS — I130 Hypertensive heart and chronic kidney disease with heart failure and stage 1 through stage 4 chronic kidney disease, or unspecified chronic kidney disease: Secondary | ICD-10-CM | POA: Diagnosis not present

## 2021-08-09 DIAGNOSIS — J9601 Acute respiratory failure with hypoxia: Secondary | ICD-10-CM | POA: Diagnosis not present

## 2021-08-09 LAB — GLUCOSE, CAPILLARY
Glucose-Capillary: 174 mg/dL — ABNORMAL HIGH (ref 70–99)
Glucose-Capillary: 168 mg/dL — ABNORMAL HIGH (ref 70–99)
Glucose-Capillary: 136 mg/dL — ABNORMAL HIGH (ref 70–99)
Glucose-Capillary: 165 mg/dL — ABNORMAL HIGH (ref 70–99)

## 2021-08-09 LAB — T4, FREE: Free T4: 1.86 ng/dL — ABNORMAL HIGH (ref 0.61–1.12)

## 2021-08-09 LAB — BASIC METABOLIC PANEL
Anion gap: 11 (ref 5–15)
BUN: 34 mg/dL — ABNORMAL HIGH (ref 8–23)
CO2: 21 mmol/L — ABNORMAL LOW (ref 22–32)
Calcium: 8.4 mg/dL — ABNORMAL LOW (ref 8.9–10.3)
Chloride: 101 mmol/L (ref 98–111)
Creatinine, Ser: 1.29 mg/dL — ABNORMAL HIGH (ref 0.61–1.24)
GFR, Estimated: 53 mL/min — ABNORMAL LOW (ref >60)
Glucose, Bld: 171 mg/dL — ABNORMAL HIGH (ref 70–99)
Potassium: 3.6 mmol/L (ref 3.5–5.1)
Sodium: 133 mmol/L — ABNORMAL LOW (ref 135–145)

## 2021-08-09 MED ORDER — POTASSIUM CHLORIDE CRYS ER 20 MEQ PO TBCR
40.0000 meq | EXTENDED_RELEASE_TABLET | Freq: Once | ORAL | Status: AC
  Administered 2021-08-09: 40 meq via ORAL
  Filled 2021-08-09: qty 2

## 2021-08-09 MED ORDER — METOPROLOL TARTRATE 25 MG PO TABS
25.0000 mg | ORAL_TABLET | Freq: Two times a day (BID) | ORAL | Status: DC
  Administered 2021-08-09 – 2021-08-11 (×4): 25 mg via ORAL
  Filled 2021-08-09 (×8): qty 1

## 2021-08-09 MED ORDER — SODIUM CHLORIDE 0.9 % IV SOLN
INTRAVENOUS | Status: DC | PRN
Start: 2021-08-09 — End: 2021-08-14

## 2021-08-09 MED ORDER — MAGNESIUM SULFATE 2 GM/50ML IV SOLN
2.0000 g | Freq: Once | INTRAVENOUS | Status: AC
  Administered 2021-08-09: 2 g via INTRAVENOUS
  Filled 2021-08-09: qty 50

## 2021-08-09 NOTE — Consult Note (Signed)
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 This is to be topped with a dry gauze 2x2 and covered with a silicone foam sacral dressing placed with the "tip" oriented away from the anus for maximum coverage. ? ?A pressure redistribution chair cushion is ordered for use when OOB in the chair and patient is to be turned from side to side to minimize time in the supine position. Bilateral pressure redistribution heel boots are provided to prevent heel pressure injuries. ? ?[facility] nursing team will not follow, but will remain available to this patient, the nursing and medical teams.  Please re-consult if needed. ?Thanks, ?Maudie Flakes, MSN, RN, [place], [place], CWON-AP, [place]  ?Pager# [phone]  ? ? ? ?  ?

## 2021-08-09 NOTE — Progress Notes (Signed)
?PROGRESS NOTE ? ? ? ?Adrian Olson  GHW:[id number] DOB: [birth year] DOA: 08/08/2021 ?PCP: Merrilee Seashore, MD  ? ?  ?Brief Narrative:  ?Adrian Olson is a 88 y.o. male with history of diastolic CHF with significant mitral regurgitation despite clipping presents to the ER with worsening shortness of breath with fatigue over the last few days.  Patient had recently followed up with patient's cardiologist Dr. Tamala Julian and patient's Lasix was reintroduced.  Patient has noted increasing swelling of lower extremities and shortness of breath on exertion.  Patient was admitted and cardiology consulted. ? ?New events last 24 hours / Subjective: ?Patient sitting in a recliner today.  Remains on room air.  He complains of some allergies and nasal/sinus congestion which he takes Zyrtec and Flonase for at home.  Urine output recorded 850 mL yesterday, 600 mL so far this morning. ? ?Assessment & Plan: ?  ?Principal Problem: ?  Acute CHF (congestive heart failure) ([place]) ?Active Problems: ?  Essential hypertension ?  S/P mitral valve clip implantation ?  Recurrent pleural effusion ?  Hypothyroidism ?  Type 2 diabetes mellitus with hyperlipidemia ([place]) ?  Stage 3b chronic kidney disease (CKD) ([place]) ? ?Acute on chronic diastolic heart failure with severe mitral regurgitation ?-BNP 2976 ?-IV Lasix ?-Strict I's and O's, daily weight, fluid restriction diet ?-Cardiology consulted ? ?Demand ischemia ?-268 >> 344 >> 386 >> 402 ?-Cardiology consulted ?-Continue aspirin ? ?History of PSVT ?-Continue Lopressor ?-Telemetry  ? ?Hypertension ?-Continue ramipril, Lopressor ? ?HLD ?-Conitnue pravachol  ? ?Diabetes mellitus type 2 ?-Sliding scale insulin ? ?CKD stage 3a ?-At baseline ? ?Hypothyroidism ?-TSH and free T4 are both elevated and repeat as outpatient in 4 to 6 weeks ?-Continue Synthroid ? ?BPH ?-Continue proscar, flomax ? ?Depression/anxiety ?-Continue zoloft  ? 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 ?DVT prophylaxis:  ?enoxaparin (LOVENOX) injection 40 mg Start: 08/08/21 2200 ? ?Code Status: Full ?Family Communication: None at bedside  ?Disposition Plan:  ?Status is: Inpatient ?Remains inpatient appropriate because: IV lasix  ? ?Consultants:  ?Cardiology ? ?Procedures:  ?None  ? ?Antimicrobials:  ?Anti-infectives (From admission, onward)  ? ? None  ? ?  ? ? ? ?Objective: ?Vitals:  ? 08/09/21 0541 08/09/21 0827 08/09/21 0939 08/09/21 1207  ?BP: (!) 124/96 132/74 98/66 123/75  ?Pulse: 73 (!) 108 61 61  ?Resp:  '16 20 16  '$ ?Temp: 97.6 ?F (36.4 ?C) (!) 97.5 ?F (36.4 ?C)  97.7 ?F (36.5 ?C)  ?TempSrc: Oral Oral  Oral  ?SpO2: 96% 95%  99%  ?Weight:      ?Height:      ? ? ?Intake/Output Summary (Last 24 hours) at 08/09/2021 1318 ?Last data filed at 08/09/2021 0945 ?Gross per 24 hour  ?Intake 730 ml  ?Output 1450 ml  ?Net -720 ml  ? ?Filed Weights  ? 08/08/21 2051 08/09/21 0500  ?Weight: 59.1 kg 58.6 kg  ? ? ?Examination: ?General exam: Appears calm and comfortable  ?Respiratory system: Clear to auscultation. Respiratory effort normal. No respiratory distress. No conversational dyspnea. On room air  ?Cardiovascular system: S1 & S2 heard. No murmurs. +bilateral pedal edema. ?Gastrointestinal system: Abdomen is nondistended, soft and nontender. Normal bowel sounds heard. ?Central nervous system: Alert and oriented. No focal neurological deficits. Speech clear.  ?Extremities: Symmetric in appearance  ?Skin: No rashes, lesions or ulcers on exposed skin  ?Psychiatry: Judgement and insight appear normal. Mood & affect appropriate.  ? ?Data Reviewed: I  have personally reviewed following labs and imaging studies ? ?CBC: ?Recent Labs  ?Lab 08/08/21 ?1625 08/08/21 ?2232  ?WBC 7.1 7.3  ?HGB 14.2 12.9*  ?HCT 42.7 39.8  ?MCV 92.4 93.2  ?PLT 169 146*  ? ?Basic Metabolic Panel: ?Recent Labs  ?Lab 08/08/21 ?1625 08/08/21 ?2232 08/09/21 ?0330  ?NA 130*  --  133*  ?K 4.2  --  3.6  ?CL 97*  --   101  ?CO2 20*  --  21*  ?GLUCOSE 210*  --  171*  ?BUN 32*  --  34*  ?CREATININE 1.33* 1.39* 1.29*  ?CALCIUM 9.0  --  8.4*  ?MG  --  1.7  --   ? ?GFR: ?Estimated Creatinine Clearance: 32.8 mL/min (A) (by C-G formula based on SCr of 1.29 mg/dL (H)). ?Liver Function Tests: ?Recent Labs  ?Lab 08/08/21 ?1625  ?AST 63*  ?ALT 47*  ?ALKPHOS 77  ?BILITOT 0.8  ?PROT 7.3  ?ALBUMIN 3.8  ? ?No results for input(s): LIPASE, AMYLASE in the last 168 hours. ?No results for input(s): AMMONIA in the last 168 hours. ?Coagulation Profile: ?No results for input(s): INR, PROTIME in the last 168 hours. ?Cardiac Enzymes: ?No results for input(s): CKTOTAL, CKMB, CKMBINDEX, TROPONINI in the last 168 hours. ?BNP (last 3 results) ?Recent Labs  ?  07/28/21 ?1219  ?PROBNP 3,863.0*  ? ?HbA1C: ?No results for input(s): HGBA1C in the last 72 hours. ?CBG: ?Recent Labs  ?Lab 08/08/21 ?1722 08/08/21 ?2234 08/09/21 ?0737 08/09/21 ?1204  ?GLUCAP 190* 218* 174* 168*  ? ?Lipid Profile: ?No results for input(s): CHOL, HDL, LDLCALC, TRIG, CHOLHDL, LDLDIRECT in the last 72 hours. ?Thyroid Function Tests: ?Recent Labs  ?  08/08/21 ?2232 08/09/21 ?0751  ?TSH 4.998*  --   ?FREET4  --  1.86*  ? ?Anemia Panel: ?No results for input(s): VITAMINB12, FOLATE, FERRITIN, TIBC, IRON, RETICCTPCT in the last 72 hours. ?Sepsis Labs: ?No results for input(s): PROCALCITON, LATICACIDVEN in the last 168 hours. ? ?No results found for this or any previous visit (from the past 240 hour(s)).  ? ? ?Radiology Studies: ?DG Chest 2 View ? ?Result Date: 08/08/2021 ?CLINICAL DATA:  Shortness of breath and weakness x2 days. EXAM: CHEST - 2 VIEW COMPARISON:  July 28, 2021 FINDINGS: The lungs are mildly hyperinflated. Mild left basilar atelectasis is seen. This is mildly decreased in severity when compared to the prior exam. Mild to moderate severity right basilar infiltrate is noted. This is new when compared to the prior study. Small, stable bilateral pleural effusions are noted. No  pneumothorax is identified. The cardiac silhouette is enlarged and unchanged in size. The visualized skeletal structures are unremarkable. IMPRESSION: 1. Mild to moderate severity right basilar infiltrate. 2. Small, stable bilateral pleural effusions. 3. Mild left basilar atelectasis, mildly decreased in severity when compared to the prior study. Electronically Signed   By: Virgina Norfolk M.D.   On: 08/08/2021 17:15   ? ? ? ?Scheduled Meds: ? aspirin  325 mg Oral Daily  ? enoxaparin (LOVENOX) injection  40 mg Subcutaneous Q24H  ? finasteride  5 mg Oral Q1200  ? furosemide  40 mg Intravenous Q12H  ? insulin aspart  0-9 Units Subcutaneous TID WC  ? levothyroxine  100 mcg Oral Daily  ? metoprolol tartrate  25 mg Oral BID  ? pravastatin  20 mg Oral QHS  ? ramipril  5 mg Oral Daily  ? sertraline  50 mg Oral Daily  ? tamsulosin  0.4 mg Oral Q1200  ? ?Continuous Infusions: ?  sodium chloride 10 mL/hr at 08/09/21 1023  ? ? ? LOS: 1 day  ? ? ? ?Dessa Phi, DO ?[facility] ?08/09/2021, 1:18 PM  ? ?Available via Epic secure chat 7am-7pm ?After these hours, please refer to coverage provider listed on [email or web address] ? ?

## 2021-08-09 NOTE — Progress Notes (Addendum)
End of shift ? ?Pt A & Ox4.  Slept most of the day.  Was able to ambulate from the bed to the chair several times and walked in the hall with staff 1 time about 82f.  Pt tolerated well without SOB.   ? ?Pt has an IS at bedside which he uses without encouragement. ? ?Pt getting IV lasix and has male purwick in place.   ? ?Pt has a wound on his buttocks. Wound care consulted.  Supplies ordered for pt's wound. ? ?Anticipate Pt remaining in the hospital another day or two. ?

## 2021-08-09 NOTE — Consult Note (Signed)
?Cardiology Consultation:  ? ?Patient ID: Adrian Olson ?MRN: [id number]; DOB: October 31, 1930 ? ?Admit date: 08/08/2021 ?Date of Consult: 08/09/2021 ? ?PCP:  Merrilee Seashore, MD ?  ?[facility] Providers ?Cardiologist:  [name], MD  ? ?Patient Profile:  ? ?Adrian Olson is a 88 y.o. male with a history of nonobstructive CAD noted on cardiac catheterization in 09/2019 prior to valve surgery, chronic diastolic CHF, mitral valve prolapse with severe MR s/p MitraClip in 09/2019, bilateral carotid artery disease, current pleural effusions requiring thoracentesis, hypertension, hyperlipidemia, and type 2 diabetes who is being seen for the evaluation of acute on chronic CHF and elevated troponin at the request of Dr. Matilde Sprang. ? ?History of Present Illness:  ? ?Adrian Olson is a 88 year old male with the above history who is followed by Adrian Olson.  Patient was doing well until 04/2021 when he had a syncope episode and fall. He was hospitalized at that time for traumatic hemothorax requiring right thoracentesis. Since then,he has had problems recently with recurrent HF exacerbations as well as recurrent pleural effusions pleural effusions requiring multiple thoracentesis. Echo in 04/2021 during an admission for CHF exacerbation showed LVEF of 55-60% with normal wall motion, mild LVH, moderate biatrial enlargement, moderate MR, mild AI, and a moderate left pleural effusion. Monitor in 04/2021 showed underlying sinus rhythm with 60 self terminating episodes of SVT  with longest episode lasting 18 seconds, one 6 beat run of NSVT, and frequent PVCs (13.7% burden). He was recently admitted in 06/2021 for acute on chronic diastolic CHF after presenting with worsening dyspnea and fatigue.  BNP elevated at 1489 at that time.  He was diuresed with IV Lasix and then transitioned back to PO Lasix. Chest x-ray that admission did show a loculated right-sided pleural effusion and outpatient follow-up with Pulmonology was  recommended.  He was seen in the [facility]F clinic for follow-up on 07/05/2021 at which time he was continuing to lose weight and was down to 221 lbs and was feeling very weak.  Called the on-call cardiology provider prior to this visit was instructed to hold his Lasix and this did help his weakness.  ReDs clip reading was 27% at that visit.  He was advised to continue Lasix 40 mg only as needed for weight gain over 133 lbs.  He was then recently seen by Adrian Olson on 08/01/2021 at which time he noted slow improvement but did note some dyspnea on exertion and some progressive lower extremity edema.  He did have a repeat right thoracentesis for his right pleural effusion at his Pulmonology office a few days prior to this visit.  He was advised to restart Lasix 40 mg on Mondays, Wednesdays, Friday. ? ?Patient presented to the [facility] ED on 08/08/2021 for further evaluation of increased shortness of breath and weakness. Patient reports progressive dyspnea on exertion, fatigue, and weakness since last visit with Adrian Olson to the point where it is difficult for him to ambulate in his house. No significant shortness of breath at rest. No orthopnea or PND. He states he has never been told he has lower extremity edema until most recent office visit. No chest pain. He is unaware of his frequent ectopy and denies any palpitations, lightheadedness, dizziness, or syncope. He denies any recent fevers or illnesses. He does describe some postnasal drip and congestion recently but no other illnesses. No GI symptoms. No abnormal bleeding in urine or stools. ? ?Upon arrival to the ED, stable.  EGD showed normal sinus  rhythm with multiple premature beats and abnormal T waves in leads V2 and V3.  High-sensitivity troponin elevated at 268 >> 344 >> 386 >> 402.  BNP markedly elevated at 2976.  Chest x-ray showed mild to moderate severity right basilar infiltrate, small stable bilateral pleural effusions, and mild left basilar atelectasis.  WBC 7.1, Hgb 14.2, Plts 169. Na 130, K 4.2, Glucose 210, Cr 1.33, BUN 32. Albumin 3.8, AST 63, ALT 47, Alk Phos 77, Total Bili 0.8. TSH mildly elevated at 4.998. Urinalysis was hazy in appearance and showed 30 of protein but was otherwise unremarkable. Patient was started on IV Lasix and admitted for acute CHF exacerbation. Cardiology consulted for assistance. ? ?Past Medical History:  ?Diagnosis Date  ? Bilateral carotid artery disease ([place])   ? Severe Left and moderate Right  ? Diabetes mellitus without complication ([place])   ? ED (erectile dysfunction)   ? Hyperlipidemia   ? Hypertension   ? Lumbar disc disease   ? Mitral valve prolapse   ? moderate to severe MR, LVEF 55% Echo 2010  ? Moderate mitral regurgitation 03/16/2014  ? S/P mitral valve clip implantation 10/09/2019  ? s/p TEER using 2 MitraClip XTW devices, first device positioned A2 P2, second device positioned A1 P1 by Dr. Burt Knack  ? ? ?Past Surgical History:  ?Procedure Laterality Date  ? CARDIAC CATHETERIZATION    ? MITRAL VALVE REPAIR  10/09/2019  ? MITRAL VALVE REPAIR (N/A )  ? MITRAL VALVE REPAIR N/A 10/09/2019  ? Procedure: MITRAL VALVE REPAIR;  Surgeon: Sherren Mocha, MD;  Location: [place];  Service: Cardiovascular;  Laterality: N/A;  ? RIGHT/LEFT HEART CATH AND CORONARY ANGIOGRAPHY N/A 09/16/2019  ? Procedure: RIGHT/LEFT HEART CATH AND CORONARY ANGIOGRAPHY;  Surgeon: Belva Crome, MD;  Location: [facility] [facility];  Service: Cardiovascular;  Laterality: N/A;  ? TEE WITHOUT CARDIOVERSION N/A 09/16/2019  ? Procedure: TRANSESOPHAGEAL ECHOCARDIOGRAM (TEE);  Surgeon: Lelon Perla, MD;  Location: [facility] ENDOSCOPY;  Service: Cardiovascular;  Laterality: N/A;  ?  ? ?Home Medications:  ?Prior to Admission medications   ?Medication Sig Start Date End Date Taking? Authorizing Provider  ?aspirin 81 MG EC tablet Take 81 mg by mouth daily.   Yes [provider]  ?cetirizine (ZYRTEC ALLERGY) 10 MG tablet Take 1 tablet (10 mg total) by mouth  daily. 06/21/21  Yes Clayton Bibles, NP  ?Cholecalciferol (VITAMIN D) 2000 UNITS tablet Take 2,000 Units by mouth daily at 12 noon.    Yes [provider]  ?Cyanocobalamin (VITAMIN B-12) 2000 MCG TBCR Take 1 tablet by mouth daily.   Yes [provider]  ?finasteride (PROSCAR) 5 MG tablet Take 5 mg by mouth daily at 12 noon.    Yes [provider]  ?fluticasone (FLONASE) 50 MCG/ACT nasal spray Place 2 sprays into both nostrils daily. ?Patient taking differently: Place 2 sprays into both nostrils daily as needed for allergies. 06/21/21  Yes Clayton Bibles, NP  ?furosemide (LASIX) 40 MG tablet Take 1 tablet (40 mg total) by mouth every Monday, Wednesday, and Friday. 08/01/21  Yes Belva Crome, MD  ?Glycopyrrolate-Formoterol (BEVESPI AEROSPHERE) 9-4.8 MCG/ACT AERO Inhale 2 puffs into the lungs 2 (two) times daily. 08/03/21  Yes Freddi Starr, MD  ?levothyroxine (SYNTHROID) 100 MCG tablet Take 1 tablet by mouth daily. 07/22/21  Yes [provider]  ?metFORMIN (GLUCOPHAGE) 500 MG tablet Take 500 mg by mouth every evening.  04/23/16  Yes [provider]  ?metoprolol tartrate (LOPRESSOR)  25 MG tablet Take 12.5 mg by mouth 2 (two) times daily.   Yes [provider]  ?potassium chloride SA (KLOR-CON M) 20 MEQ tablet Take 0.5 tablets (10 mEq total) by mouth every Monday, Wednesday, and Friday. 08/01/21  Yes Belva Crome, MD  ?pravastatin (PRAVACHOL) 20 MG tablet Take 20 mg by mouth at bedtime.    Yes [provider]  ?ramipril (ALTACE) 5 MG capsule Take 5 mg by mouth daily.    Yes [provider]  ?tamsulosin (FLOMAX) 0.4 MG CAPS capsule Take 0.4 mg by mouth daily. 04/13/18  Yes [provider]  ?Thiamine HCl (VITAMIN B1 PO) Take 125 mcg by mouth daily.   Yes [provider]  ?albuterol (VENTOLIN HFA) 108 (90 Base) MCG/ACT inhaler Inhale 2 puffs into the lungs every 6 (six) hours as needed for wheezing or shortness of breath.  06/21/21   Cobb, Karie Schwalbe, NP  ?metoprolol tartrate (LOPRESSOR) 25 MG tablet Take 0.5 tablets (12.5 mg total) by mouth 2 (two) times daily. 05/25/21 07/05/21  Deberah Pelton, NP  ?ONETOUCH VERIO test strip See admin

## 2021-08-10 ENCOUNTER — Inpatient Hospital Stay (HOSPITAL_COMMUNITY): Payer: Medicare HMO

## 2021-08-10 DIAGNOSIS — Z9889 Other specified postprocedural states: Secondary | ICD-10-CM

## 2021-08-10 DIAGNOSIS — N1832 Chronic kidney disease, stage 3b: Secondary | ICD-10-CM

## 2021-08-10 DIAGNOSIS — I5031 Acute diastolic (congestive) heart failure: Secondary | ICD-10-CM | POA: Diagnosis not present

## 2021-08-10 DIAGNOSIS — E1169 Type 2 diabetes mellitus with other specified complication: Secondary | ICD-10-CM

## 2021-08-10 DIAGNOSIS — E785 Hyperlipidemia, unspecified: Secondary | ICD-10-CM

## 2021-08-10 DIAGNOSIS — E039 Hypothyroidism, unspecified: Secondary | ICD-10-CM | POA: Diagnosis not present

## 2021-08-10 DIAGNOSIS — I10 Essential (primary) hypertension: Secondary | ICD-10-CM | POA: Diagnosis not present

## 2021-08-10 DIAGNOSIS — Z95818 Presence of other cardiac implants and grafts: Secondary | ICD-10-CM

## 2021-08-10 LAB — BASIC METABOLIC PANEL WITH GFR
Anion gap: 9 (ref 5–15)
BUN: 32 mg/dL — ABNORMAL HIGH (ref 8–23)
CO2: 26 mmol/L (ref 22–32)
Calcium: 8.8 mg/dL — ABNORMAL LOW (ref 8.9–10.3)
Chloride: 100 mmol/L (ref 98–111)
Creatinine, Ser: 1.18 mg/dL (ref 0.61–1.24)
GFR, Estimated: 59 mL/min — ABNORMAL LOW
Glucose, Bld: 144 mg/dL — ABNORMAL HIGH (ref 70–99)
Potassium: 3.4 mmol/L — ABNORMAL LOW (ref 3.5–5.1)
Sodium: 135 mmol/L (ref 135–145)

## 2021-08-10 LAB — GLUCOSE, CAPILLARY
Glucose-Capillary: 144 mg/dL — ABNORMAL HIGH (ref 70–99)
Glucose-Capillary: 180 mg/dL — ABNORMAL HIGH (ref 70–99)
Glucose-Capillary: 97 mg/dL (ref 70–99)
Glucose-Capillary: 132 mg/dL — ABNORMAL HIGH (ref 70–99)

## 2021-08-10 LAB — MAGNESIUM: Magnesium: 2 mg/dL (ref 1.7–2.4)

## 2021-08-10 MED ORDER — POTASSIUM CHLORIDE CRYS ER 20 MEQ PO TBCR
40.0000 meq | EXTENDED_RELEASE_TABLET | Freq: Once | ORAL | Status: AC
  Administered 2021-08-10: 40 meq via ORAL
  Filled 2021-08-10: qty 2

## 2021-08-10 MED ORDER — GUAIFENESIN ER 600 MG PO TB12
600.0000 mg | ORAL_TABLET | Freq: Two times a day (BID) | ORAL | Status: DC
Start: 2021-08-10 — End: 2021-08-14
  Administered 2021-08-10 – 2021-08-14 (×7): 600 mg via ORAL
  Filled 2021-08-10 (×9): qty 1

## 2021-08-10 MED ORDER — ASPIRIN EC 81 MG PO TBEC
81.0000 mg | DELAYED_RELEASE_TABLET | Freq: Every day | ORAL | Status: DC
  Administered 2021-08-11 – 2021-08-14 (×4): 81 mg via ORAL
  Filled 2021-08-10 (×4): qty 1

## 2021-08-10 NOTE — TOC Progression Note (Signed)
Transition of Care (TOC) - Progression Note  ? ? ?Patient Details  ?Name: Adrian Olson ?MRN: [id number] ?Date of Birth: 06-Jan-1931 ? ?Transition of Care (TOC) CM/SW Contact  ?Purcell Mouton, RN ?Phone Number: ?08/10/2021, 3:08 PM ? ?Clinical Narrative:    ? ?Spoke with pt's daughter concerning disposition for pt. PT recommendations are home with HHPT. Daughter states that is fine.  ? ?  ?  ? ?Expected Discharge Plan and Services ?  ?  ?  ?  ?  ?                ?  ?  ?  ?  ?  ?  ?  ?  ?  ?  ? ? ?Social Determinants of Health (SDOH) Interventions ?  ? ?Readmission Risk Interventions ?   ? View : No data to display.  ?  ?  ?  ? ? ?

## 2021-08-10 NOTE — Evaluation (Signed)
Physical Therapy Evaluation ?Patient Details ?Name: Adrian Olson ?MRN: [id number] ?DOB: February 14, 1931 ?Today's Date: 08/10/2021 ? ?History of Present Illness ? Pt is 88 yo male admitted on 08/08/21 with acute CHF with severe mitral regurgitation and demand ischemia.   Pt with hx including mitral regurgitation despite clipping, DM, HTN, lumbar disc disease  ?Clinical Impression ? Pt admitted with above diagnosis. Pt has had declining mobility over the past 4 months.  Prior to that reports he was walking 2-3 miles/daily and now ambulates short community distances, fatigues easily, and uses rollator.  Pt was getting HHPT prior to admission.  Today, he was min guard transfers and ambulated 100' with RW and cues.  Pt has support and DME at home. Pt currently with functional limitations due to the deficits listed below (see PT Problem List). Pt will benefit from skilled PT to increase their independence and safety with mobility to allow discharge to the venue listed below.   ?   ?   ? ?Recommendations for follow up therapy are one component of a multi-disciplinary discharge planning process, led by the attending physician.  Recommendations may be updated based on patient status, additional functional criteria and insurance authorization. ? ?Follow Up Recommendations Home health PT ? ?  ?Assistance Recommended at Discharge Intermittent Supervision/Assistance  ?Patient can return home with the following ? A little help with walking and/or transfers;A little help with bathing/dressing/bathroom;Assistance with cooking/housework;Help with stairs or ramp for entrance ? ?  ?Equipment Recommendations None recommended by PT  ?Recommendations for Other Services ?    ?  ?Functional Status Assessment Patient has had a recent decline in their functional status and demonstrates the ability to make significant improvements in function in a reasonable and predictable amount of time.  ? ?  ?Precautions / Restrictions Precautions ?Precautions:  Fall  ? ?  ? ?Mobility ? Bed Mobility ?Overal bed mobility: Needs Assistance ?Bed Mobility: Supine to Sit, Sit to Supine ?  ?  ?Supine to sit: Min guard ?Sit to supine: Supervision ?  ?General bed mobility comments: increased time and effort to sit ?  ? ?Transfers ?Overall transfer level: Needs assistance ?Equipment used: Rolling walker (2 wheels) ?Transfers: Sit to/from Stand ?Sit to Stand: Min guard ?  ?  ?  ?  ?  ?General transfer comment: min guard for safety; cues to push with hands ?  ? ?Ambulation/Gait ?Ambulation/Gait assistance: Min guard ?Gait Distance (Feet): 100 Feet ?Assistive device: Rolling walker (2 wheels) ?Gait Pattern/deviations: Step-through pattern, Decreased stride length ?Gait velocity: decreased ?  ?  ?General Gait Details: min guard for safety and cues for RW (used to using rollator); pt ambulates with kyphotic posture with mildly flexed knees but increased hip extension/lordosis ? ?Stairs ?  ?  ?  ?  ?  ? ?Wheelchair Mobility ?  ? ?Modified Rankin (Stroke Patients Only) ?  ? ?  ? ?Balance Overall balance assessment: Needs assistance ?Sitting-balance support: No upper extremity supported ?Sitting balance-Leahy Scale: Good ?  ?  ?Standing balance support: Bilateral upper extremity supported, Reliant on assistive device for balance ?Standing balance-Leahy Scale: Poor ?Standing balance comment: Uses RWbut no LOB ?  ?  ?  ?  ?  ?  ?  ?  ?  ?  ?  ?   ? ? ? ?Pertinent Vitals/Pain Pain Assessment ?Pain Assessment: No/denies pain  ? ? ?Home Living Family/patient expects to be discharged to:: Private residence ?Living Arrangements: Spouse/significant other ?Available Help at Discharge: Family;Available 24 hours/day ?Type of  Home: House ?Home Access: Level entry ?  ?  ?  ?Home Layout: One level ?Home Equipment: BSC/3in1;Rollator (4 wheels);Hand held shower head ?   ?  ?Prior Function Prior Level of Function : Needs assist;History of Falls (last six months) ?  ?  ?  ?  ?  ?  ?Mobility Comments: Reports  use of rollator and supervision for gait. currently working with [employer].  Pt ambulating short community distances.  Prior to fall in November, pt was exercising, ambulating 2-3 miles/daily ?ADLs Comments: increased time with ADLs, wife assists with LB dressing and bathing. ?  ? ? ?Hand Dominance  ?   ? ?  ?Extremity/Trunk Assessment  ? Upper Extremity Assessment ?Upper Extremity Assessment: Overall WFL for tasks assessed ?  ? ?Lower Extremity Assessment ?Lower Extremity Assessment: LLE deficits/detail;RLE deficits/detail ?RLE Deficits / Details: ROM WFL; MMT 5/5 ?LLE Deficits / Details: ROM WFL; MMT 5/5 ?  ? ?Cervical / Trunk Assessment ?Cervical / Trunk Assessment: Kyphotic;Other exceptions ?Cervical / Trunk Exceptions: Pt tends to stand with knees slightly flexed, hips extended, and kyphotic  ?Communication  ? Communication: HOH  ?Cognition Arousal/Alertness: Awake/alert ?Behavior During Therapy: [facility] for tasks assessed/performed ?Overall Cognitive Status: Within Functional Limits for tasks assessed ?  ?  ?  ?  ?  ?  ?  ?  ?  ?  ?  ?  ?  ?  ?  ?  ?  ?  ?  ? ?  ?General Comments General comments (skin integrity, edema, etc.): VSS ? ?  ?Exercises    ? ?Assessment/Plan  ?  ?PT Assessment Patient needs continued PT services  ?PT Problem List Decreased strength;Decreased mobility;Decreased range of motion;Decreased activity tolerance;Decreased balance;Cardiopulmonary status limiting activity ? ?   ?  ?PT Treatment Interventions DME instruction;Therapeutic activities;Gait training;Therapeutic exercise;Patient/family education;Stair training;Functional mobility training;Balance training   ? ?PT Goals (Current goals can be found in the Care Plan section)  ?Acute Rehab PT Goals ?Patient Stated Goal: return home ?PT Goal Formulation: With patient ?Time For Goal Achievement: 08/24/21 ?Potential to Achieve Goals: Good ? ?  ?Frequency Min 3X/week ?  ? ? ?Co-evaluation   ?  ?  ?  ?  ? ? ?  ?AM-PAC PT "6 Clicks" Mobility  ?Outcome  Measure Help needed turning from your back to your side while in a flat bed without using bedrails?: A Little ?Help needed moving from lying on your back to sitting on the side of a flat bed without using bedrails?: A Little ?Help needed moving to and from a bed to a chair (including a wheelchair)?: A Little ?Help needed standing up from a chair using your arms (e.g., wheelchair or bedside chair)?: A Little ?Help needed to walk in hospital room?: A Little ?Help needed climbing 3-5 steps with a railing? : A Little ?6 Click Score: 18 ? ?  ?End of Session Equipment Utilized During Treatment: Gait belt ?Activity Tolerance: Patient tolerated treatment well ?Patient left: in bed;with call bell/phone within reach ?Nurse Communication: Mobility status ?PT Visit Diagnosis: Other abnormalities of gait and mobility (R26.89);Muscle weakness (generalized) (M62.81) ?  ? ?Time: 2119-4174 ?PT Time Calculation (min) (ACUTE ONLY): 17 min ? ? ?Charges:   PT Evaluation ?$PT Eval Low Complexity: 1 Low ?  ?  ?   ? ? ?Abran Richard, PT ?Acute Rehab Services ?Pager [phone] ?[facility] [phone] ? ? ?Adrian Olson ?08/10/2021, 2:06 PM ? ?

## 2021-08-10 NOTE — Progress Notes (Signed)
? ?Progress Note ? ?Patient Name: Adrian Olson ?Date of Encounter: 08/10/2021 ? ?[facility] Cardiologist: [name], MD  ? ?Subjective  ? ?Feeling better.  Wondering what he can do to improve his status.  ? ?Inpatient Medications  ?  ?Scheduled Meds: ? aspirin  325 mg Oral Daily  ? enoxaparin (LOVENOX) injection  40 mg Subcutaneous Q24H  ? finasteride  5 mg Oral Q1200  ? furosemide  40 mg Intravenous Q12H  ? guaiFENesin  600 mg Oral BID  ? insulin aspart  0-9 Units Subcutaneous TID WC  ? levothyroxine  100 mcg Oral Daily  ? metoprolol tartrate  25 mg Oral BID  ? pravastatin  20 mg Oral QHS  ? ramipril  5 mg Oral Daily  ? sertraline  50 mg Oral Daily  ? tamsulosin  0.4 mg Oral Q1200  ? ?Continuous Infusions: ? sodium chloride Stopped (08/09/21 1130)  ? ?PRN Meds: ?sodium chloride, acetaminophen **OR** acetaminophen, albuterol  ? ?Vital Signs  ?  ?Vitals:  ? 08/10/21 0500 08/10/21 0537 08/10/21 0543 08/10/21 0957  ?BP:   134/81 102/78  ?Pulse:   84   ?Resp:   20 13  ?Temp:   98.1 ?F (36.7 ?C)   ?TempSrc:   Oral   ?SpO2:   97%   ?Weight: 61.8 kg 56 kg    ?Height:      ? ? ?Intake/Output Summary (Last 24 hours) at 08/10/2021 1330 ?Last data filed at 08/10/2021 0900 ?Gross per 24 hour  ?Intake 271.56 ml  ?Output 3100 ml  ?Net -2828.44 ml  ? ? ?  08/10/2021  ?  5:37 AM 08/10/2021  ?  5:00 AM 08/09/2021  ?  5:00 AM  ?Last 3 Weights  ?Weight (lbs) 123 lb 7.3 oz 136 lb 3.9 oz 129 lb 3 oz  ?Weight (kg) 56 kg 61.8 kg 58.6 kg  ?   ? ?Telemetry  ?  ?Sinus rhythm.  PVCs.  - Personally Reviewed ? ?ECG  ?  ?N/a - Personally Reviewed ? ?Physical Exam  ? ?VS:  BP 102/78 (BP Location: Left Arm)   Pulse 84   Temp 98.1 ?F (36.7 ?C) (Oral)   Resp 13   Ht '5\' 9"'$  (1.753 m)   Wt 56 kg   SpO2 97%   BMI 18.23 kg/m?  , BMI Body mass index is 18.23 kg/m?. ?GENERAL:  Frail.  Chronically ill-appearing ?HEENT: Pupils equal round and reactive, fundi not visualized, oral mucosa unremarkable ?NECK:  + jugular venous distention,  waveform within normal limits, carotid upstroke brisk and symmetric, no bruits, no thyromegaly ?LUNGS:  Clear to auscultation bilaterally ?HEART:  RRR.  PMI not displaced or sustained,S1 and S2 within normal limits, no S3, no S4, no clicks, no rubs, II/VI systolic murmurs ?ABD:  Flat, positive bowel sounds normal in frequency in pitch, no bruits, no rebound, no guarding, no midline pulsatile mass, no hepatomegaly, no splenomegaly ?EXT:  2 plus pulses throughout, no edema, no cyanosis no clubbing ?SKIN:  No rashes no nodules ?NEURO:  Cranial nerves II through XII grossly intact, motor grossly intact throughout ?PSYCH:  Cognitively intact, oriented to person place and time ? ? ?Labs  ?  ?High Sensitivity Troponin:   ?Recent Labs  ?Lab 08/08/21 ?1625 08/08/21 ?1818 08/08/21 ?2232 08/08/21 ?2313  ?TROPONINIHS 268* 344* 386* 402*  ?   ?Chemistry ?Recent Labs  ?Lab 08/08/21 ?1625 08/08/21 ?2232 08/09/21 ?0330 08/10/21 ?0343  ?NA 130*  --  133* 135  ?K 4.2  --  3.6 3.4*  ?CL 97*  --  101 100  ?CO2 20*  --  21* 26  ?GLUCOSE 210*  --  171* 144*  ?BUN 32*  --  34* 32*  ?CREATININE 1.33* 1.39* 1.29* 1.18  ?CALCIUM 9.0  --  8.4* 8.8*  ?MG  --  1.7  --  2.0  ?PROT 7.3  --   --   --   ?ALBUMIN 3.8  --   --   --   ?AST 63*  --   --   --   ?ALT 47*  --   --   --   ?ALKPHOS 77  --   --   --   ?BILITOT 0.8  --   --   --   ?GFRNONAA 51* 49* 53* 59*  ?ANIONGAP 13  --  11 9  ?  ?Lipids No results for input(s): CHOL, TRIG, HDL, LABVLDL, LDLCALC, CHOLHDL in the last 168 hours.  ?Hematology ?Recent Labs  ?Lab 08/08/21 ?1625 08/08/21 ?2232  ?WBC 7.1 7.3  ?RBC 4.62 4.27  ?HGB 14.2 12.9*  ?HCT 42.7 39.8  ?MCV 92.4 93.2  ?MCH 30.7 30.2  ?MCHC 33.3 32.4  ?RDW 16.9* 17.0*  ?PLT 169 146*  ? ?Thyroid  ?Recent Labs  ?Lab 08/08/21 ?2232 08/09/21 ?0751  ?TSH 4.998*  --   ?FREET4  --  1.86*  ?  ?BNP ?Recent Labs  ?Lab 08/08/21 ?1625  ?BNP 2,976.1*  ?  ?DDimer No results for input(s): DDIMER in the last 168 hours.  ? ?Radiology  ?  ?DG Chest 2  View ? ?Result Date: 08/08/2021 ?CLINICAL DATA:  Shortness of breath and weakness x2 days. EXAM: CHEST - 2 VIEW COMPARISON:  July 28, 2021 FINDINGS: The lungs are mildly hyperinflated. Mild left basilar atelectasis is seen. This is mildly decreased in severity when compared to the prior exam. Mild to moderate severity right basilar infiltrate is noted. This is new when compared to the prior study. Small, stable bilateral pleural effusions are noted. No pneumothorax is identified. The cardiac silhouette is enlarged and unchanged in size. The visualized skeletal structures are unremarkable. IMPRESSION: 1. Mild to moderate severity right basilar infiltrate. 2. Small, stable bilateral pleural effusions. 3. Mild left basilar atelectasis, mildly decreased in severity when compared to the prior study. Electronically Signed   By: Virgina Norfolk M.D.   On: 08/08/2021 17:15  ? ?DG Foot 2 Views Right ? ?Result Date: 08/10/2021 ?CLINICAL DATA:  Fifth toe pain for several months without known injury. EXAM: RIGHT FOOT - 2 VIEW COMPARISON:  None. FINDINGS: There is no evidence of fracture or dislocation. There is no evidence of arthropathy or other focal bone abnormality. Soft tissues are unremarkable. IMPRESSION: Negative. Electronically Signed   By: Marijo Conception M.D.   On: 08/10/2021 08:56   ? ?Cardiac Studies  ? ?Echo 04/26/21: ?1. Left ventricular ejection fraction, by estimation, is 55 to 60%. Left  ?ventricular ejection fraction by 3D volume is 56 %. The left ventricle has  ?normal function. The left ventricle has no regional wall motion  ?abnormalities. There is mild concentric  ?left ventricular hypertrophy. Left ventricular diastolic function could  ?not be evaluated. Elevated left atrial pressure.  ? 2. Right ventricular systolic function is normal. The right ventricular  ?size is normal. Tricuspid regurgitation signal is inadequate for assessing  ?PA pressure.  ? 3. Left atrial size was moderately dilated.  ? 4.  Right atrial size was moderately dilated.  ? 5. Moderate pleural effusion  in the left lateral region.  ? 6. The mitral valve has been repaired/replaced. Moderate mitral valve  ?regurgitation. The mean mitral valve gradient is 4.0 mmHg with average  ?heart rate of 58 bpm. There is a Mitra-Clip present in the mitral  ?position. Procedure Date: 10/09/2019.  ? 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  ? 8. The aortic valve is tricuspid. There is moderate calcification of the  ?aortic valve. There is moderate thickening of the aortic valve. Aortic  ?valve regurgitation is mild.  ? 9. The inferior vena cava is dilated in size with <50% respiratory  ?variability, suggesting right atrial pressure of 15 mmHg.  ? ?Patient Profile  ?   ? Mr. Swindell is an 46M with with moderate CAD, persistent moderate to severe mitral regurgitation after MitraClip, chronic diastolic heart failure, recurrent pleural effusion, hypertension, hyperlipidemia, and diabetes admitted with acute on chronic diastolic heart failure and elevated troponin.   ? ?Assessment & Plan  ?  ?# Moderate to severe MR:  ?# s/p MitraClip: ?# Acute on chronic diastolic heart failure: ?# HTN:  ?Volume status improved significantly with diuresis.  He was net -3.4 L yesterday.  Renal function is stable.  He was able to ambulate without dyspnea.  Continue diuresis. ? ?# CAD: ?# Demand ischemia:  ?# HL: ?60-67% LAD stenosis.  HS troponin elevated but he denies chest pain.  Likely demand ischemia.  Continue medical management for now.  Unclear why he is on ASA '325mg'$  instead of '81mg'$ .  Will switch.  Continue metoprolol and pravastatin. ? ? ?For questions or updates, please contact [place] ?Please consult [email or web address] for contact info under  ? ?  ?   ?Signed, ?Skeet Latch, MD  ?08/10/2021, 1:30 PM   ? ?

## 2021-08-10 NOTE — Progress Notes (Signed)
[facility] ? ?PROGRESS NOTE ? ?JAYVION STEFANSKI QQP:[id number] DOB: 1931/03/28 DOA: 08/08/2021 ?PCP: Merrilee Seashore, MD ? ? ?Brief HPI:   ?88 year old male with a history of diastolic CHF with severe mitral regurgitation despite MitraClip, came to ED with worsening shortness of breath and fatigue over past few days.  Patient follows with cardiology Dr. Tamala Julian, Lasix was reintroduced.  Patient has noted increasing swelling of lower extremities with shortness of breath on exertion.  Patient was noted and cardiology was consulted. ? ? ? ?Subjective  ? ?Patient seen and examined, denies shortness of breath.  Feels fatigue and anorexia. ? ? Assessment/Plan:  ? ? ? ?Acute on chronic diastolic heart failure with severe mitral regurgitation ?-Patient was found to have elevated BNP 2976 ?-Started on IV Lasix 40 mg IV every 12 hours ?-Diuresed well with Lasix; net -4.2 L ?-Cardiology following ? ?Elevated troponin ?-Troponin has been elevated due to demand ischemia ?-Might need ischemic work-up ?-As per cardiology, continue aspirin ? ?History of PSVT ?-Continue Lopressor ?-Continue monitoring on telemetry ? ?Hypertension ?-Continue ramipril, Lopressor ? ?Hyperlipidemia ?-Continue Pravachol ? ?Diabetes mellitus type 2 ?-Continue sliding scale insulin with NovoLog ?-CBG well controlled ? ?CKD stage III ?-Creatinine at baseline ? ?Hypothyroidism ?-TSH and free T4 are both elevated. ?-We will need to repeat labs as outpatient in 4 to 6 weeks ?-Continue Synthroid ? ?BPH ?-Continue Proscar, Flomax. ? ?Depression/anxiety ?-Continue Zoloft. ? 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 ? ? ?Medications ? ?  ? [START ON 08/11/2021] aspirin EC  81 mg Oral Daily  ? enoxaparin (LOVENOX) injection  40 mg Subcutaneous Q24H  ? finasteride  5 mg Oral Q1200  ? furosemide  40 mg Intravenous Q12H  ? guaiFENesin  600 mg Oral BID  ? insulin aspart  0-9 Units Subcutaneous TID WC  ? levothyroxine   100 mcg Oral Daily  ? metoprolol tartrate  25 mg Oral BID  ? pravastatin  20 mg Oral QHS  ? ramipril  5 mg Oral Daily  ? sertraline  50 mg Oral Daily  ? tamsulosin  0.4 mg Oral Q1200  ? ? ? Data Reviewed:  ? ?CBG: ? ?Recent Labs  ?Lab 08/09/21 ?1702 08/09/21 ?2153 08/10/21 ?0829 08/10/21 ?1118 08/10/21 ?1657  ?GLUCAP 136* 165* 144* 180* 97  ? ? ?SpO2: 95 %  ? ? ?Vitals:  ? 08/10/21 0537 08/10/21 0543 08/10/21 0957 08/10/21 1434  ?BP:  134/81 102/78 96/63  ?Pulse:  84  89  ?Resp:  '20 13 20  '$ ?Temp:  98.1 ?F (36.7 ?C)  97.6 ?F (36.4 ?C)  ?TempSrc:  Oral  Oral  ?SpO2:  97%  95%  ?Weight: 56 kg     ?Height:      ? ? ? ? ?Data Reviewed: ? ?Basic Metabolic Panel: ?Recent Labs  ?Lab 08/08/21 ?1625 08/08/21 ?2232 08/09/21 ?0330 08/10/21 ?0343  ?NA 130*  --  133* 135  ?K 4.2  --  3.6 3.4*  ?CL 97*  --  101 100  ?CO2 20*  --  21* 26  ?GLUCOSE 210*  --  171* 144*  ?BUN 32*  --  34* 32*  ?CREATININE 1.33* 1.39* 1.29* 1.18  ?CALCIUM 9.0  --  8.4* 8.8*  ?MG  --  1.7  --  2.0  ? ? ?CBC: ?Recent Labs  ?Lab 08/08/21 ?1625 08/08/21 ?2232  ?WBC 7.1 7.3  ?HGB 14.2 12.9*  ?HCT 42.7 39.8  ?MCV 92.4 93.2  ?PLT 169 146*  ? ? ?  LFT ?Recent Labs  ?Lab 08/08/21 ?1625  ?AST 63*  ?ALT 47*  ?ALKPHOS 77  ?BILITOT 0.8  ?PROT 7.3  ?ALBUMIN 3.8  ? ?  ?Antibiotics: ?Anti-infectives (From admission, onward)  ? ? None  ? ?  ? ? ? ?DVT prophylaxis: Lovenox ? ?Code Status: Full code ? ?Family Communication: No family at bedside ? ? ?CONSULTS cardiology ? ? ?Objective  ? ? ?Physical Examination: ? ? ?General-appears in no acute distress ?Heart-S1-S2, regular, no murmur auscultated ?Lungs-clear to auscultation bilaterally, no wheezing or crackles auscultated ?Abdomen-soft, nontender, no organomegaly ?Extremities-no edema in the lower extremities ?Neuro-alert, oriented x3, no focal deficit noted ? ?Status is: Inpatient: CHF exacerbation ? ? ? ?  ? ? ? ? ? ?[name] ?  ?[facility] ?If 7PM-7AM, please contact night-coverage at [email or web address], ?Office   [phone] ? ? ?08/10/2021, 5:26 PM  LOS: 2 days  ? ? ? ? ? ? ? ? ? ? ?  ?

## 2021-08-11 DIAGNOSIS — E43 Unspecified severe protein-calorie malnutrition: Secondary | ICD-10-CM | POA: Insufficient documentation

## 2021-08-11 DIAGNOSIS — I5031 Acute diastolic (congestive) heart failure: Secondary | ICD-10-CM | POA: Diagnosis not present

## 2021-08-11 DIAGNOSIS — I10 Essential (primary) hypertension: Secondary | ICD-10-CM | POA: Diagnosis not present

## 2021-08-11 DIAGNOSIS — J90 Pleural effusion, not elsewhere classified: Secondary | ICD-10-CM | POA: Diagnosis not present

## 2021-08-11 DIAGNOSIS — E039 Hypothyroidism, unspecified: Secondary | ICD-10-CM | POA: Diagnosis not present

## 2021-08-11 LAB — GLUCOSE, CAPILLARY
Glucose-Capillary: 126 mg/dL — ABNORMAL HIGH (ref 70–99)
Glucose-Capillary: 168 mg/dL — ABNORMAL HIGH (ref 70–99)
Glucose-Capillary: 158 mg/dL — ABNORMAL HIGH (ref 70–99)
Glucose-Capillary: 192 mg/dL — ABNORMAL HIGH (ref 70–99)

## 2021-08-11 LAB — BASIC METABOLIC PANEL
Anion gap: 8 (ref 5–15)
BUN: 27 mg/dL — ABNORMAL HIGH (ref 8–23)
CO2: 28 mmol/L (ref 22–32)
Calcium: 8.4 mg/dL — ABNORMAL LOW (ref 8.9–10.3)
Chloride: 98 mmol/L (ref 98–111)
Creatinine, Ser: 1.07 mg/dL (ref 0.61–1.24)
GFR, Estimated: >60 mL/min (ref >60)
Glucose, Bld: 132 mg/dL — ABNORMAL HIGH (ref 70–99)
Potassium: 3.3 mmol/L — ABNORMAL LOW (ref 3.5–5.1)
Sodium: 134 mmol/L — ABNORMAL LOW (ref 135–145)

## 2021-08-11 MED ORDER — FUROSEMIDE 40 MG PO TABS
40.0000 mg | ORAL_TABLET | Freq: Every day | ORAL | Status: DC
  Filled 2021-08-11: qty 1

## 2021-08-11 MED ORDER — PROSOURCE PLUS PO LIQD
30.0000 mL | Freq: Two times a day (BID) | ORAL | Status: DC
  Administered 2021-08-11 – 2021-08-12 (×2): 30 mL via ORAL
  Filled 2021-08-11 (×2): qty 30

## 2021-08-11 MED ORDER — ADULT MULTIVITAMIN W/MINERALS CH
1.0000 | ORAL_TABLET | Freq: Every day | ORAL | Status: DC
  Administered 2021-08-11 – 2021-08-14 (×4): 1 via ORAL
  Filled 2021-08-11 (×4): qty 1

## 2021-08-11 MED ORDER — POTASSIUM CHLORIDE CRYS ER 20 MEQ PO TBCR
40.0000 meq | EXTENDED_RELEASE_TABLET | Freq: Two times a day (BID) | ORAL | Status: AC
  Administered 2021-08-11 (×2): 40 meq via ORAL
  Filled 2021-08-11 (×2): qty 2

## 2021-08-11 MED ORDER — POTASSIUM CHLORIDE CRYS ER 20 MEQ PO TBCR: 40.0000 meq | EXTENDED_RELEASE_TABLET | Freq: Two times a day (BID) | ORAL | Status: DC

## 2021-08-11 NOTE — Progress Notes (Signed)
? ?Progress Note ? ?Patient Name: Adrian Olson ?Date of Encounter: 08/11/2021 ? ?[facility] Cardiologist: [name], MD  ? ?Subjective  ? ?No acute overnight events. Patient sleeping soundly when I walked into the room. He is doing well this morning. He states his breathing has improved but he thinks he still has a little ways to go. No chest pain, palpitations, lightheadedness, or dizziness.  ? ?Inpatient Medications  ?  ?Scheduled Meds: ? aspirin EC  81 mg Oral Daily  ? enoxaparin (LOVENOX) injection  40 mg Subcutaneous Q24H  ? finasteride  5 mg Oral Q1200  ? furosemide  40 mg Intravenous Q12H  ? guaiFENesin  600 mg Oral BID  ? insulin aspart  0-9 Units Subcutaneous TID WC  ? levothyroxine  100 mcg Oral Daily  ? metoprolol tartrate  25 mg Oral BID  ? pravastatin  20 mg Oral QHS  ? ramipril  5 mg Oral Daily  ? sertraline  50 mg Oral Daily  ? tamsulosin  0.4 mg Oral Q1200  ? ?Continuous Infusions: ? sodium chloride Stopped (08/09/21 1130)  ? ?PRN Meds: ?sodium chloride, acetaminophen **OR** acetaminophen, albuterol  ? ?Vital Signs  ?  ?Vitals:  ? 08/10/21 0957 08/10/21 1434 08/10/21 2125 08/11/21 0603  ?BP: 102/78 96/63 117/71 126/84  ?Pulse:  89 85 (!) 51  ?Resp: 13 20 (!) 21 20  ?Temp:  97.6 ?F (36.4 ?C) (!) 97.5 ?F (36.4 ?C) 98 ?F (36.7 ?C)  ?TempSrc:  Oral Oral   ?SpO2:  95% 96% 97%  ?Weight:      ?Height:      ? ? ?Intake/Output Summary (Last 24 hours) at 08/11/2021 0713 ?Last data filed at 08/11/2021 0600 ?Gross per 24 hour  ?Intake 240 ml  ?Output 2300 ml  ?Net -2060 ml  ? ? ?  08/10/2021  ?  5:37 AM 08/10/2021  ?  5:00 AM 08/09/2021  ?  5:00 AM  ?Last 3 Weights  ?Weight (lbs) 123 lb 7.3 oz 136 lb 3.9 oz 129 lb 3 oz  ?Weight (kg) 56 kg 61.8 kg 58.6 kg  ?   ? ?Telemetry  ?  ?Sinus rhythm with frequent PVCs/PACs. Rates mostly in the 50s to 7s. - Personally Reviewed ? ?ECG  ?  ?No new EKG tracing today. - Personally Reviewed ? ?Physical Exam  ? ?GEN: Thin, frail Caucasian male. No acute distress.    ?Neck: No JVD appreciated. ?Cardiac: Irregular rhythm with normal rate. Soft II/VI systolic murmur. Radial pulses 2+ and equal bilaterally.  ?Respiratory: Clear to auscultation bilaterally. No wheezes, rhonchi, or rales. ?GI: Soft, non-distended, and non-tender. ?MS: No lower extremity edema. No deformity. ?Skin: Warm and dry. ?Neuro:  Alert and oriented x3. No foal deficits. ?Psych: Normal affect. Responds appropriately. ? ?Labs  ?  ?High Sensitivity Troponin:   ?Recent Labs  ?Lab 08/08/21 ?1625 08/08/21 ?1818 08/08/21 ?2232 08/08/21 ?2313  ?TROPONINIHS 268* 344* 386* 402*  ?   ?Chemistry ?Recent Labs  ?Lab 08/08/21 ?1625 08/08/21 ?2232 08/09/21 ?0330 08/10/21 ?4010 08/11/21 ?2725  ?NA 130*  --  133* 135 134*  ?K 4.2  --  3.6 3.4* 3.3*  ?CL 97*  --  101 100 98  ?CO2 20*  --  21* 26 28  ?GLUCOSE 210*  --  171* 144* 132*  ?BUN 32*  --  34* 32* 27*  ?CREATININE 1.33* 1.39* 1.29* 1.18 1.07  ?CALCIUM 9.0  --  8.4* 8.8* 8.4*  ?MG  --  1.7  --  2.0  --   ?PROT 7.3  --   --   --   --   ?ALBUMIN 3.8  --   --   --   --   ?AST 63*  --   --   --   --   ?ALT 47*  --   --   --   --   ?ALKPHOS 77  --   --   --   --   ?BILITOT 0.8  --   --   --   --   ?GFRNONAA 51* 49* 53* 59* >60  ?ANIONGAP 13  --  '11 9 8  '$ ?  ?Lipids No results for input(s): CHOL, TRIG, HDL, LABVLDL, LDLCALC, CHOLHDL in the last 168 hours.  ?Hematology ?Recent Labs  ?Lab 08/08/21 ?1625 08/08/21 ?2232  ?WBC 7.1 7.3  ?RBC 4.62 4.27  ?HGB 14.2 12.9*  ?HCT 42.7 39.8  ?MCV 92.4 93.2  ?MCH 30.7 30.2  ?MCHC 33.3 32.4  ?RDW 16.9* 17.0*  ?PLT 169 146*  ? ?Thyroid  ?Recent Labs  ?Lab 08/08/21 ?2232 08/09/21 ?0751  ?TSH 4.998*  --   ?FREET4  --  1.86*  ?  ?BNP ?Recent Labs  ?Lab 08/08/21 ?1625  ?BNP 2,976.1*  ?  ?DDimer No results for input(s): DDIMER in the last 168 hours.  ? ?Radiology  ?  ?DG Foot 2 Views Right ? ?Result Date: 08/10/2021 ?CLINICAL DATA:  Fifth toe pain for several months without known injury. EXAM: RIGHT FOOT - 2 VIEW COMPARISON:  None. FINDINGS: There  is no evidence of fracture or dislocation. There is no evidence of arthropathy or other focal bone abnormality. Soft tissues are unremarkable. IMPRESSION: Negative. Electronically Signed   By: Marijo Conception M.D.   On: 08/10/2021 08:56   ? ?Cardiac Studies  ? ?Right/Left Cardiac Catheterization 09/16/2019: ?Documented severe mitral regurgitation due to flail of the P2 segment.  Large left atrium. ?Mean pulmonary wedge pressure 14 mmHg with V wave spiking to 36 mmHg. ?Normal left ventricular systolic function.  MR not graded because LV gram was performed by simple hand injection. ?Normal left main ?Large left anterior descending that wraps around the left ventricular apex.  Proximal vessel contains eccentric 40% narrowing from ostium to proximal segment.  The mid segment after the dominant diagonal contains eccentric 60 to 70% narrowing.  LAD diagonal forms a [place] 010 stenosis configuration. ?Normal circumflex.  Circumflex is relatively small. ?Large ramus intermedius gives origin to the large first septal perforator. ?Dominant, RCA with minimal proximal luminal irregularities. ?  ?Recommendations: ?Moderate LAD coronary disease which can be treated medically until or if he develops anginal symptoms. ?He is being referred to the valve clinic/Dr. Roxy Manns for consideration of mitral valve surgery/MitraClip. ?Diagnostic ?Dominance: Right ?_______________ ?  ?Echocardiogram 04/26/2021: ?Impressions: ? 1. Left ventricular ejection fraction, by estimation, is 55 to 60%. Left  ?ventricular ejection fraction by 3D volume is 56 %. The left ventricle has  ?normal function. The left ventricle has no regional wall motion  ?abnormalities. There is mild concentric  ?left ventricular hypertrophy. Left ventricular diastolic function could  ?not be evaluated. Elevated left atrial pressure.  ? 2. Right ventricular systolic function is normal. The right ventricular  ?size is normal. Tricuspid regurgitation signal is inadequate for  assessing  ?PA pressure.  ? 3. Left atrial size was moderately dilated.  ? 4. Right atrial size was moderately dilated.  ? 5. Moderate pleural effusion in the left lateral region.  ? 6. The mitral  valve has been repaired/replaced. Moderate mitral valve  ?regurgitation. The mean mitral valve gradient is 4.0 mmHg with average  ?heart rate of 58 bpm. There is a Mitra-Clip present in the mitral  ?position. Procedure Date: 10/09/2019.  ? 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  ? 8. The aortic valve is tricuspid. There is moderate calcification of the  ?aortic valve. There is moderate thickening of the aortic valve. Aortic  ?valve regurgitation is mild.  ? 9. The inferior vena cava is dilated in size with <50% respiratory  ?variability, suggesting right atrial pressure of 15 mmHg.  ? ?Comparison(s): No significant change from prior study. Prior images  ?reviewed side by side.  ? ?Patient Profile  ?   ?88 y.o. male with a history of nonobstructive CAD noted on cardiac catheterization in 09/2019 prior to valve surgery, chronic diastolic CHF, mitral valve prolapse with severe MR s/p MitraClip in 09/2019, bilateral carotid artery disease, recurrent pleural effusions requiring thoracentesis, hypertension, hyperlipidemia, and type 2 diabetes who is being seen for the evaluation of acute on chronic CHF and elevated troponin at the request of Dr. Matilde Sprang. ? ?Assessment & Plan  ?  ?Acute on Chronic Diastolic CHF ?Patient has recurrent problems with CHF lately and presented with worsening dyspnea and fatigue. BNP markedly elevated at 2,976. Chest x-ray showed Chest x-ray showed mild to moderate severity right basilar infiltrate, small stable bilateral pleural effusions, and mild left basilar atelectasis. Last Echo in 04/2021 showed LVEF of 55-60% with normal wall motion, mild LVH, moderate biatrial enlargement, moderate MR, and mild AI. He was started on IV Lasix. Documented urinary output of 2,300 yesterday and net negative 5.8 L this  admission. No updated weights today but down 6-7 lbs from admission. Creatinine improving with diuresis. ?- Volume status improving. Does not appear significant volume overloaded on exam. ?- Continue IV Lasix 4

## 2021-08-11 NOTE — Discharge Instructions (Addendum)
Low-Sodium Nutrition Therapy ?Eating less sodium can help you if you have high blood pressure, heart failure, or kidney or liver disease. ?Your body needs a little sodium, but too much sodium can cause your body to hold onto extra water.  ?This extra water will raise your blood pressure and can cause damage to your heart, kidneys, or liver as they are forced to work harder. ?Sometimes you can see how the extra fluid affects you because your hands, legs, or belly swell.  ?You may also hold water around your heart and lungs, which makes it hard to breathe. ?Even if you take medication for blood pressure or a water pill (diuretic) to remove fluid, it is still important to have less salt in your diet. ?Check with your primary care provider before drinking alcohol since it may affect the amount of fluid in your body and how ?your heart, kidneys, or liver work. ? ?Sodium in Food ?A low-sodium meal plan limits the sodium that you get from food and beverages to 1,500-2,000 milligrams (mg) per day. Salt is the main source of sodium.  ?Read the nutrition label on the package to find out how much sodium is in one serving of a food. ?Select foods with 140 milligrams (mg) of sodium or less per serving. ?You may be able to eat one or two servings of foods with a little more than 140 milligrams (mg) of sodium if you are ?closely watching how much sodium you eat in a day. ?Check the serving size on the label. The amount of sodium listed on the label shows the amount in one serving of the ?food. So, if you eat more than one serving, you will get more sodium than the amount listed. ? ?Cutting Back on Sodium ?Eat more fresh foods. ?Fresh fruits and vegetables are low in sodium, as well as frozen vegetables and fruits that have no added juices or sauces. ?Fresh meats are lower in sodium than processed meats, such as bacon, sausage, and hotdogs. ?Not all processed foods are unhealthy, but some processed foods may have too much  sodium. ?Eat less salt at the table and when cooking.  ?One of the ingredients in salt is sodium. ?One teaspoon of table salt has 2,300 milligrams of sodium. ?Leave the salt out of recipes for pasta, casseroles, and soups. ?Be a Paramedic. ?Food packages that say ?Salt-free?, sodium-free?, ?very low sodium,? and ?low sodium? have less than 140 ?milligrams of sodium per serving. ?Beware of products identified as ?Unsalted,? ?No Salt Added,? ?Reduced Sodium,? or ?Lower Sodium.?  ?These items may still be high in sodium. You should always check the nutrition label. ?Add flavors to your food without adding sodium. Try lemon juice, lime juice, or vinegar. Dry or fresh herbs add flavor. ?Buy a sodium-free seasoning blend or make your own at home. ?You can purchase salt-free or sodium-free condiments like barbeque sauce in stores and online.  ? ?Eating in Restaurants ?Choose foods carefully when you eat outside your home. Restaurant foods can be very high in sodium.  ?Many restaurants provide nutrition facts on their menus or their websites. If you cannot find that information, ask your server.  ?Let your server know that you want your food to be cooked without salt and that you would like your salad dressing and sauces to be served on the side. ? ?Foods Recommended ?Grains ?Bread and rolls without salted tops ?Homemade bread made with reduced-sodium baking powder ?Cold cereals, especially shredded wheat and puffed rice ?Oats, grits, or  cream of wheat ?Pastas, quinoa, and rice ?Popcorn, pretzels or crackers without salt ?Corn tortillas ?Protein Foods ?Fresh meats and fish (check the nutrition labels - make sure they are not packaged in a sodium solution) ?Canned or packed tuna (no more than 4 ounces at 1 serving) ?Beans and peas ?Soybeans and tofu ?Eggs ?Nuts or nut butters without salt ?Dairy ?Milk or milk powder ?Plant milks, such as rice and soy ?Yogurt, including Greek yogurt ?Small amounts of natural cheese  (blocks of cheese) or reduced-sodium cheese can be used in moderation.  ?(Swiss, ricotta, and fresh mozzarella cheese are lower in sodium than the others) ?Cream Cheese ?Low sodium cottage cheese ?Vegetables ?Fresh and frozen vegetables without added sauces or salt ?Homemade soups (without salt) ?Low-sodium, salt-free or sodium-free canned vegetables and soups ?Fruit ?Fresh and canned fruits ?Dried fruits, such as raisins, cranberries, and prunes ?Oils ?Tub or liquid margarine, regular or without salt ?Canola, corn, peanut, olive, safflower, or sunflower oils ?Condiments ?Fresh or dried herbs such as basil, bay leaf, dill, mustard (dry), nutmeg, paprika, parsley, rosemary, sage, or thyme. ?Low sodium ketchup ?Vinegar ?Lemon or lime juice ?Pepper, red pepper flakes, and cayenne. ?Hot sauce contains sodium, but if you use just a drop or two, it will not add up to much. ?Salt-free or sodium-free seasoning mixes and marinades ?Simple salad dressings: vinegar and oil ? ?Foods Not Recommended ?Grains ?Breads or crackers topped with salt ?Cereals (hot/cold) with more than 300 mg sodium per serving ?Biscuits, cornbread, and other ?quick? breads prepared with baking soda ?Pre-packaged bread crumbs ?Seasoned and packaged rice and pasta mixes ?Self-rising flours ?Protein Foods ?Cured meats: Bacon, ham, sausage, pepperoni and hot dogs ?Canned meats (chili, vienna sausage, or sardines) ?Smoked fish and meats ?Frozen meals that have more than 600 mg of sodium per serving ?Egg substitute (with added sodium) ?Dairy ?Buttermilk ?Processed cheese spreads ?Cottage cheese (1 cup may have over 500 mg of sodium; look for low-sodium.) ?American or feta cheese ?Shredded cheese has more sodium than blocks of cheese ?String cheese ?Vegetables ?Canned vegetables (unless they are salt-free, sodium-free or low sodium) ?Frozen vegetables with seasoning and sauces ?Sauerkraut and pickled vegetables ?Canned or dried soups (unless they are  salt-free, sodium-free, or low sodium) ?[place] fries and onion rings ?Fruit  ?Dried fruits preserved with additives that have sodium ?Oils  ?Salted butter or margarine, all types of olives ?Condiments ?Salt, sea salt, kosher salt, onion salt, and garlic salt ?Seasoning mixes with salt ?Bouillon cubes ?Ketchup ?Barbeque sauce and Worcestershire sauce unless low sodium ?Soy sauce ?Salsa, pickles, olives, relish ?Salad dressings: ranch, blue cheese, [place], and [place]. ? ? ? ? ? ? ? ? ?Mental Health Resources: ? ?Producer, television/film/video at the [employer] ?[address], [address]  ?[place], [place] [address] ?[phone] ?Services are free or reduced ? ?[facility] ?[place], [place] [address] ?[phone] ?Medicaid, Medicare, Private Insurance ? ?[facility]  ?[address] ?[place],  [address]  ?[phone] ?Private insurance, Medicare A&B, and GCCN  ? ? ?

## 2021-08-11 NOTE — Progress Notes (Addendum)
Initial Nutrition Assessment ? ?DOCUMENTATION CODES:  ? ?Severe malnutrition in context of chronic illness, Underweight ? ?INTERVENTION:  ?- will order 30 ml Prosource Plus BID, each supplement provides 100 kcal and 15 grams protein.  ?- will order 1 tablet multivitamin with minerals daily. ?- will place Low Sodium Nutrition Therapy handout in AVS. ?- recommend order for 500 mg ascorbic acid BID and 220 mg zinc sulfate once/day x14 days to aid in wound healing.  ? ? ?NUTRITION DIAGNOSIS:  ? ?Severe Malnutrition related to chronic illness (CHF) as evidenced by severe fat depletion, severe muscle depletion. ? ?GOAL:  ? ?Patient will meet greater than or equal to 90% of their needs ? ?MONITOR:  ? ?PO intake, Labs, Weight trends, Skin, I & O's ? ?REASON FOR ASSESSMENT:  ? ?Malnutrition Screening Tool ? ?ASSESSMENT:  ? ?88 y.o. male with history of CHF with significant mitral regurgitation despite clipping, CAD, HTN, lumbar disc disease, DM, and erectile dysfunction. Patient presented to the ED due to worsening shortness of breath with exertion, BLE swelling, and fatigue for several days. He had recently seen his Cardiologist who had re-introduced Lasix. Patient was admitted for acute on chronic CHF. ? ?Patient laying in bed with no visitors present at the time of RD visit. He shares that he lives at home with his wife who became sick around the same time that he did and that 3-4 people have been helping to care for her and check on her, including their daughter. ? ?Patient shares that he has been hospitalized several times since Thanksgiving.  ? ?He states that appetite had been poor for some unknown amount of time PTA but that since yesterday it has been greatly improving. He reports eating 100% of dinner last night and breakfast today. Flow sheet documentation indicates he ate 100% of lunch today.  ? ?Patient has Ensure (or similar at home) but declines to receive them during this hospitalization.  ? ?Weight yesterday  was 123 lb and weight on 07/12/21 was 132 lb. This indicates 9 lb weight loss (6.8% body weight) in the past 1 month; suspect some of this is fluid related as patient reports that BLE swelling has greatly improved.  ? ? ?Labs reviewed; CBGs: 126 and 168 mg/dl, Na: 134 mmol/l, K: 3.3 mmol/l, BUN: 27 mg/dl, Ca: 8.4 mg/dl. ? ?Medications reviewed; 40 mg IV lasix BID, sliding scale novolog, 100 mcg oral synthroid/day. ?  ? ?NUTRITION - FOCUSED PHYSICAL EXAM: ? ?Flowsheet Row Most Recent Value  ?Orbital Region Moderate depletion  ?Upper Arm Region Severe depletion  ?Thoracic and Lumbar Region Severe depletion  ?Buccal Region Severe depletion  ?Temple Region Moderate depletion  ?Clavicle Bone Region Severe depletion  ?Clavicle and Acromion Bone Region Severe depletion  ?Scapular Bone Region Severe depletion  ?Dorsal Hand Moderate depletion  ?Patellar Region Moderate depletion  ?Anterior Thigh Region Moderate depletion  ?Posterior Calf Region Moderate depletion  ?Edema (RD Assessment) Mild  [BLE]  ?Hair Reviewed  ?Eyes Reviewed  ?Mouth Reviewed  ?Skin Reviewed  ?Nails Reviewed  ? ?  ? ? ?Diet Order:   ?Diet Order   ? ?       ?  Diet heart healthy/carb modified Room service appropriate? Yes; Fluid consistency: Thin; Fluid restriction: 1200 mL Fluid  Diet effective now       ?  ? ?  ?  ? ?  ? ? ?EDUCATION NEEDS:  ? ?Education needs have been addressed ? ?Skin:  Skin Assessment: Skin Integrity Issues: ?Skin Integrity  Issues:: Stage III ?Stage III: R IT (documented in notes but not in flow sheet) ? ?Last BM:  3/29 per nursing flow sheet, no other details ? ?Height:  ? ?Ht Readings from Last 1 Encounters:  ?08/08/21 '5\' 9"'$  (1.753 m)  ? ? ?Weight:  ? ?Wt Readings from Last 1 Encounters:  ?08/10/21 56 kg  ? ? ?BMI:  Body mass index is 18.23 kg/m?. ? ?Estimated Nutritional Needs:  ?Kcal:  1800-2000 kcal ?Protein:  90-105 grams ?Fluid:  >/= 1.5 L/day ? ? ? ? ?Adrian Matin, MS, RD, LDN ?Registered Dietitian II ?Inpatient Clinical  Nutrition ?RD pager # and on-call/weekend pager # available in [place]  ? ?

## 2021-08-11 NOTE — Progress Notes (Signed)
[facility] ? ?PROGRESS NOTE ? ?Adrian Olson NOB:[id number] DOB: [birth year] DOA: 08/08/2021 ?PCP: Merrilee Seashore, MD ? ? ?Brief HPI:   ?88 year old male with a history of diastolic CHF with severe mitral regurgitation despite MitraClip, came to ED with worsening shortness of breath and fatigue over past few days.  Patient follows with cardiology Dr. Tamala Julian, Lasix was reintroduced.  Patient has noted increasing swelling of lower extremities with shortness of breath on exertion.  Patient was noted and cardiology was consulted. ? ? ? ?Subjective  ? ?Patient seen and examined, feels better today.  Denies shortness of breath.  Has been intermittently coughing up clear phlegm. ? ? Assessment/Plan:  ? ? ? ?Acute on chronic diastolic heart failure with severe mitral regurgitation ?-Patient was found to have elevated BNP 2976 ?-Started on IV Lasix 40 mg IV every 12 hours ?-Diuresed well with Lasix; net -5.5 L ?-Cardiology following ?-Patient is euvolemic, likely will be switched to p.o. Lasix ? ?Elevated troponin ?-Troponin has been elevated due to demand ischemia ?-Might need ischemic work-up ?-As per cardiology, continue aspirin ? ?History of PSVT ?-Continue Lopressor ?-Continue monitoring on telemetry ? ?Hypertension ?-Continue ramipril, Lopressor ? ?Hyperlipidemia ?-Continue Pravachol ? ?Diabetes mellitus type 2 ?-Continue sliding scale insulin with NovoLog ?-CBG well controlled ? ?CKD stage III ?-Creatinine at baseline ? ?Hypothyroidism ?-TSH and free T4 are both elevated. ?-We will need to repeat labs as outpatient in 4 to 6 weeks ?-Continue Synthroid ? ?BPH ?-Continue Proscar, Flomax. ? ?Hypokalemia ?-Potassium will be replaced ?-Follow BMP in am ? ?Depression/anxiety ?-Continue Zoloft. ? 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 ? ? ?Medications ? ?  ? (feeding supplement) PROSource Plus  30 mL Oral BID BM  ? aspirin EC  81 mg Oral Daily  ? enoxaparin (LOVENOX)  injection  40 mg Subcutaneous Q24H  ? finasteride  5 mg Oral Q1200  ? furosemide  40 mg Intravenous Q12H  ? guaiFENesin  600 mg Oral BID  ? insulin aspart  0-9 Units Subcutaneous TID WC  ? levothyroxine  100 mcg Oral Daily  ? metoprolol tartrate  25 mg Oral BID  ? multivitamin with minerals  1 tablet Oral Daily  ? potassium chloride  40 mEq Oral BID  ? pravastatin  20 mg Oral QHS  ? ramipril  5 mg Oral Daily  ? sertraline  50 mg Oral Daily  ? tamsulosin  0.4 mg Oral Q1200  ? ? ? Data Reviewed:  ? ?CBG: ? ?Recent Labs  ?Lab 08/10/21 ?1118 08/10/21 ?1657 08/10/21 ?2137 08/11/21 ?0735 08/11/21 ?1140  ?GLUCAP 180* 97 132* 126* 168*  ? ? ?SpO2: 97 %  ? ? ?Vitals:  ? 08/10/21 1434 08/10/21 2125 08/11/21 0603 08/11/21 1435  ?BP: 96/63 117/71 126/84 105/64  ?Pulse: 89 85 (!) 51 (!) 54  ?Resp: 20 (!) '21 20 14  '$ ?Temp: 97.6 ?F (36.4 ?C) (!) 97.5 ?F (36.4 ?C) 98 ?F (36.7 ?C) 97.9 ?F (36.6 ?C)  ?TempSrc: Oral Oral  Oral  ?SpO2: 95% 96% 97%   ?Weight:      ?Height:      ? ? ? ? ?Data Reviewed: ? ?Basic Metabolic Panel: ?Recent Labs  ?Lab 08/08/21 ?1625 08/08/21 ?2232 08/09/21 ?0330 08/10/21 ?9476 08/11/21 ?5465  ?NA 130*  --  133* 135 134*  ?K 4.2  --  3.6 3.4* 3.3*  ?CL 97*  --  101 100 98  ?CO2 20*  --  21* 26 28  ?GLUCOSE  210*  --  171* 144* 132*  ?BUN 32*  --  34* 32* 27*  ?CREATININE 1.33* 1.39* 1.29* 1.18 1.07  ?CALCIUM 9.0  --  8.4* 8.8* 8.4*  ?MG  --  1.7  --  2.0  --   ? ? ?CBC: ?Recent Labs  ?Lab 08/08/21 ?1625 08/08/21 ?2232  ?WBC 7.1 7.3  ?HGB 14.2 12.9*  ?HCT 42.7 39.8  ?MCV 92.4 93.2  ?PLT 169 146*  ? ? ?LFT ?Recent Labs  ?Lab 08/08/21 ?1625  ?AST 63*  ?ALT 47*  ?ALKPHOS 77  ?BILITOT 0.8  ?PROT 7.3  ?ALBUMIN 3.8  ? ?  ?Antibiotics: ?Anti-infectives (From admission, onward)  ? ? None  ? ?  ? ? ? ?DVT prophylaxis: Lovenox ? ?Code Status: Full code ? ?Family Communication: No family at bedside ? ? ?CONSULTS cardiology ? ? ?Objective  ? ? ?Physical Examination: ? ? ?General-appears in no acute distress ?Heart-S1-S2,  regular, no murmur auscultated ?Lungs-clear to auscultation bilaterally, no wheezing or crackles auscultated ?Abdomen-soft, nontender, no organomegaly ?Extremities-no edema in the lower extremities ?Neuro-alert, oriented x3, no focal deficit noted ? ?Status is: Inpatient: CHF exacerbation ? ? ? ?  ? ? ? ? ? ?Adrian Olson ?  ?[facility] ?If 7PM-7AM, please contact night-coverage at [email or web address], ?Office  [phone] ? ? ?08/11/2021, 2:43 PM  LOS: 3 days  ? ? ? ? ? ? ? ? ? ? ?  ?

## 2021-08-12 DIAGNOSIS — E039 Hypothyroidism, unspecified: Secondary | ICD-10-CM | POA: Diagnosis not present

## 2021-08-12 DIAGNOSIS — I5031 Acute diastolic (congestive) heart failure: Secondary | ICD-10-CM | POA: Diagnosis not present

## 2021-08-12 DIAGNOSIS — J90 Pleural effusion, not elsewhere classified: Secondary | ICD-10-CM | POA: Diagnosis not present

## 2021-08-12 DIAGNOSIS — I10 Essential (primary) hypertension: Secondary | ICD-10-CM | POA: Diagnosis not present

## 2021-08-12 LAB — BASIC METABOLIC PANEL
Anion gap: 10 (ref 5–15)
BUN: 28 mg/dL — ABNORMAL HIGH (ref 8–23)
CO2: 25 mmol/L (ref 22–32)
Calcium: 8.7 mg/dL — ABNORMAL LOW (ref 8.9–10.3)
Chloride: 97 mmol/L — ABNORMAL LOW (ref 98–111)
Creatinine, Ser: 0.97 mg/dL (ref 0.61–1.24)
GFR, Estimated: >60 mL/min (ref >60)
Glucose, Bld: 132 mg/dL — ABNORMAL HIGH (ref 70–99)
Potassium: 3.9 mmol/L (ref 3.5–5.1)
Sodium: 132 mmol/L — ABNORMAL LOW (ref 135–145)

## 2021-08-12 LAB — GLUCOSE, CAPILLARY
Glucose-Capillary: 155 mg/dL — ABNORMAL HIGH (ref 70–99)
Glucose-Capillary: 162 mg/dL — ABNORMAL HIGH (ref 70–99)
Glucose-Capillary: 110 mg/dL — ABNORMAL HIGH (ref 70–99)
Glucose-Capillary: 278 mg/dL — ABNORMAL HIGH (ref 70–99)

## 2021-08-12 NOTE — Progress Notes (Signed)
Physical Therapy Treatment ?Patient Details ?Name: Adrian Olson ?MRN: [id number] ?DOB: December 07, 1930 ?Today's Date: 08/12/2021 ? ? ?History of Present Illness Pt is 88 yo male admitted on 08/08/21 with acute CHF with severe mitral regurgitation and demand ischemia.   Pt with hx including mitral regurgitation despite clipping, DM, HTN, lumbar disc disease ? ?  ?PT Comments  ? ? Pt making good progress but does need cues for safe RW use and exercise technique.  Pt ambulates with knees flexed, tight hamstrings, but with stretching able to achieve full extension.  Continue to progress as able.    ?Recommendations for follow up therapy are one component of a multi-disciplinary discharge planning process, led by the attending physician.  Recommendations may be updated based on patient status, additional functional criteria and insurance authorization. ? ?Follow Up Recommendations ? Home health PT ?  ?  ?Assistance Recommended at Discharge Intermittent Supervision/Assistance  ?Patient can return home with the following A little help with walking and/or transfers;A little help with bathing/dressing/bathroom;Assistance with cooking/housework;Help with stairs or ramp for entrance ?  ?Equipment Recommendations ? None recommended by PT  ?  ?Recommendations for Other Services   ? ? ?  ?Precautions / Restrictions Precautions ?Precautions: Fall  ?  ? ?Mobility ? Bed Mobility ?Overal bed mobility: Needs Assistance ?Bed Mobility: Supine to Sit, Sit to Supine ?  ?  ?Supine to sit: Supervision ?Sit to supine: Supervision ?  ?  ?  ? ?Transfers ?Overall transfer level: Needs assistance ?Equipment used: Rolling walker (2 wheels) ?Transfers: Sit to/from Stand ?Sit to Stand: Min guard ?  ?  ?  ?  ?  ?General transfer comment: min guard for safety; cues to push with hands ?  ? ?Ambulation/Gait ?Ambulation/Gait assistance: Min guard ?Gait Distance (Feet): 180 Feet ?Assistive device: Rolling walker (2 wheels) ?Gait Pattern/deviations:  Step-through pattern, Decreased stride length ?Gait velocity: decreased ?  ?  ?General Gait Details: min guard for safety and cues for RW proximity; pt ambulates with kyphotic posture with mildly flexed knees ? ? ?Stairs ?  ?  ?  ?  ?  ? ? ?Wheelchair Mobility ?  ? ?Modified Rankin (Stroke Patients Only) ?  ? ? ?  ?Balance Overall balance assessment: Needs assistance ?Sitting-balance support: No upper extremity supported ?Sitting balance-Leahy Scale: Good ?  ?  ?Standing balance support: Bilateral upper extremity supported, Reliant on assistive device for balance ?Standing balance-Leahy Scale: Poor ?Standing balance comment: Uses RWbut no LOB ?  ?  ?  ?  ?  ?  ?  ?  ?  ?  ?  ?  ? ?  ?Cognition Arousal/Alertness: Awake/alert ?Behavior During Therapy: [facility] for tasks assessed/performed ?Overall Cognitive Status: Within Functional Limits for tasks assessed ?  ?  ?  ?  ?  ?  ?  ?  ?  ?  ?  ?  ?  ?  ?  ?  ?  ?  ?  ? ?  ?Exercises General Exercises - Lower Extremity ?Quad Sets: AROM, Both, 10 reps, Supine (5 sec hold with focus on full knee ext) ?Long Arc Quad: AROM, Both, 5 reps, Seated (with focus on full extension, 5 sec hold) ?Hip Flexion/Marching: AROM, Both, 10 reps, Seated ?Other Exercises ?Other Exercises: shoulder retraction x 10 with tactile cues ?Other Exercises: sit to stand x 5 with increased time and min guard ? ?  ?General Comments General comments (skin integrity, edema, etc.): VSS ?  ?  ? ?Pertinent Vitals/Pain Pain Assessment ?  Pain Assessment: No/denies pain  ? ? ?Home Living   ?  ?  ?  ?  ?  ?  ?  ?  ?  ?   ?  ?Prior Function    ?  ?  ?   ? ?PT Goals (current goals can now be found in the care plan section) Progress towards PT goals: Progressing toward goals ? ?  ?Frequency ? ? ? Min 3X/week ? ? ? ?  ?PT Plan Current plan remains appropriate  ? ? ?Co-evaluation   ?  ?  ?  ?  ? ?  ?AM-PAC PT "6 Clicks" Mobility   ?Outcome Measure ? Help needed turning from your back to your side while in a flat bed without  using bedrails?: A Little ?Help needed moving from lying on your back to sitting on the side of a flat bed without using bedrails?: A Little ?Help needed moving to and from a bed to a chair (including a wheelchair)?: A Little ?Help needed standing up from a chair using your arms (e.g., wheelchair or bedside chair)?: A Little ?Help needed to walk in hospital room?: A Little ?Help needed climbing 3-5 steps with a railing? : A Little ?6 Click Score: 18 ? ?  ?End of Session Equipment Utilized During Treatment: Gait belt ?Activity Tolerance: Patient tolerated treatment well ?Patient left: in bed;with call bell/phone within reach ?Nurse Communication: Mobility status ?PT Visit Diagnosis: Other abnormalities of gait and mobility (R26.89);Muscle weakness (generalized) (M62.81) ?  ? ? ?Time: 1445-1510 ?PT Time Calculation (min) (ACUTE ONLY): 25 min ? ?Charges:  $Gait Training: 8-22 mins ?$Therapeutic Exercise: 8-22 mins          ?          ? ?Adrian Olson, PT ?[facility] ?Pager [phone] ?Adrian Olson [phone] ? ? ? ?Adrian Olson ?08/12/2021, 3:15 PM ? ?

## 2021-08-12 NOTE — Progress Notes (Signed)
? ?Progress Note ? ?Patient Name: Adrian Olson ?Date of Encounter: 08/12/2021 ? ?[facility] Cardiologist: [name], MD  ? ?Subjective  ? ?Feeling better.  He is a little lightheaded when he stands and his mouth feels very dry.  Breathing is much better. ? ?Inpatient Medications  ?  ?Scheduled Meds: ? (feeding supplement) PROSource Plus  30 mL Oral BID BM  ? aspirin EC  81 mg Oral Daily  ? enoxaparin (LOVENOX) injection  40 mg Subcutaneous Q24H  ? finasteride  5 mg Oral Q1200  ? guaiFENesin  600 mg Oral BID  ? insulin aspart  0-9 Units Subcutaneous TID WC  ? levothyroxine  100 mcg Oral Daily  ? metoprolol tartrate  25 mg Oral BID  ? multivitamin with minerals  1 tablet Oral Daily  ? pravastatin  20 mg Oral QHS  ? ramipril  5 mg Oral Daily  ? sertraline  50 mg Oral Daily  ? tamsulosin  0.4 mg Oral Q1200  ? ?Continuous Infusions: ? sodium chloride Stopped (08/09/21 1130)  ? ?PRN Meds: ?sodium chloride, acetaminophen **OR** acetaminophen, albuterol  ? ?Vital Signs  ?  ?Vitals:  ? 08/12/21 0500 08/12/21 0530 08/12/21 1059 08/12/21 1145  ?BP:  108/79 106/65 (!) 112/55  ?Pulse:  88 65 63  ?Resp:  20    ?Temp:  97.8 ?F (36.6 ?C)  97.7 ?F (36.5 ?C)  ?TempSrc:    Oral  ?SpO2:  97%  97%  ?Weight: 53.2 kg     ?Height:      ? ? ?Intake/Output Summary (Last 24 hours) at 08/12/2021 1219 ?Last data filed at 08/12/2021 0554 ?Gross per 24 hour  ?Intake 480 ml  ?Output 2500 ml  ?Net -2020 ml  ? ? ?  08/12/2021  ?  5:00 AM 08/10/2021  ?  5:37 AM 08/10/2021  ?  5:00 AM  ?Last 3 Weights  ?Weight (lbs) 117 lb 4.6 oz 123 lb 7.3 oz 136 lb 3.9 oz  ?Weight (kg) 53.2 kg 56 kg 61.8 kg  ?   ? ?Telemetry  ?  ?Sinus rhythm.  PVCs.  - Personally Reviewed ? ?ECG  ?  ?N/a - Personally Reviewed ? ?Physical Exam  ? ?VS:  BP (!) 112/55   Pulse 63   Temp 97.7 ?F (36.5 ?C) (Oral)   Resp 20   Ht '5\' 9"'$  (1.753 m)   Wt 53.2 kg   SpO2 97%   BMI 17.32 kg/m?  , BMI Body mass index is 17.32 kg/m?. ?GENERAL:  Frail.  Chronically  ill-appearing ?HEENT: Pupils equal round and reactive, fundi not visualized, oral mucosa unremarkable ?NECK: No JVD.  Waveform within normal limits, carotid upstroke brisk and symmetric, no bruits, no thyromegaly ?LUNGS:  Clear to auscultation bilaterally ?HEART:  RRR.  PMI not displaced or sustained,S1 and S2 within normal limits, no S3, no S4, no clicks, no rubs, II/VI systolic murmurs ?ABD:  Flat, positive bowel sounds normal in frequency in pitch, no bruits, no rebound, no guarding, no midline pulsatile mass, no hepatomegaly, no splenomegaly ?EXT:  2 plus pulses throughout, no edema, no cyanosis no clubbing ?SKIN:  No rashes no nodules ?NEURO:  Cranial nerves II through XII grossly intact, motor grossly intact throughout ?PSYCH:  Cognitively intact, oriented to person place and time ? ? ?Labs  ?  ?High Sensitivity Troponin:   ?Recent Labs  ?Lab 08/08/21 ?1625 08/08/21 ?1818 08/08/21 ?2232 08/08/21 ?2313  ?TROPONINIHS 268* 344* 386* 402*  ?   ?Chemistry ?Recent  Labs  ?Lab 08/08/21 ?1625 08/08/21 ?2232 08/09/21 ?0330 08/10/21 ?3382 08/11/21 ?5053 08/12/21 ?0413  ?NA 130*  --    < > 135 134* 132*  ?K 4.2  --    < > 3.4* 3.3* 3.9  ?CL 97*  --    < > 100 98 97*  ?CO2 20*  --    < > '26 28 25  '$ ?GLUCOSE 210*  --    < > 144* 132* 132*  ?BUN 32*  --    < > 32* 27* 28*  ?CREATININE 1.33* 1.39*   < > 1.18 1.07 0.97  ?CALCIUM 9.0  --    < > 8.8* 8.4* 8.7*  ?MG  --  1.7  --  2.0  --   --   ?PROT 7.3  --   --   --   --   --   ?ALBUMIN 3.8  --   --   --   --   --   ?AST 63*  --   --   --   --   --   ?ALT 47*  --   --   --   --   --   ?ALKPHOS 77  --   --   --   --   --   ?BILITOT 0.8  --   --   --   --   --   ?GFRNONAA 51* 49*   < > 59* >60 >60  ?ANIONGAP 13  --    < > '9 8 10  '$ ? < > = values in this interval not displayed.  ?  ?Lipids No results for input(s): CHOL, TRIG, HDL, LABVLDL, LDLCALC, CHOLHDL in the last 168 hours.  ?Hematology ?Recent Labs  ?Lab 08/08/21 ?1625 08/08/21 ?2232  ?WBC 7.1 7.3  ?RBC 4.62 4.27  ?HGB 14.2  12.9*  ?HCT 42.7 39.8  ?MCV 92.4 93.2  ?MCH 30.7 30.2  ?MCHC 33.3 32.4  ?RDW 16.9* 17.0*  ?PLT 169 146*  ? ?Thyroid  ?Recent Labs  ?Lab 08/08/21 ?2232 08/09/21 ?0751  ?TSH 4.998*  --   ?FREET4  --  1.86*  ?  ?BNP ?Recent Labs  ?Lab 08/08/21 ?1625  ?BNP 2,976.1*  ?  ?DDimer No results for input(s): DDIMER in the last 168 hours.  ? ?Radiology  ?  ?No results found. ? ?Cardiac Studies  ? ?Echo 04/26/21: ?1. Left ventricular ejection fraction, by estimation, is 55 to 60%. Left  ?ventricular ejection fraction by 3D volume is 56 %. The left ventricle has  ?normal function. The left ventricle has no regional wall motion  ?abnormalities. There is mild concentric  ?left ventricular hypertrophy. Left ventricular diastolic function could  ?not be evaluated. Elevated left atrial pressure.  ? 2. Right ventricular systolic function is normal. The right ventricular  ?size is normal. Tricuspid regurgitation signal is inadequate for assessing  ?PA pressure.  ? 3. Left atrial size was moderately dilated.  ? 4. Right atrial size was moderately dilated.  ? 5. Moderate pleural effusion in the left lateral region.  ? 6. The mitral valve has been repaired/replaced. Moderate mitral valve  ?regurgitation. The mean mitral valve gradient is 4.0 mmHg with average  ?heart rate of 58 bpm. There is a Mitra-Clip present in the mitral  ?position. Procedure Date: 10/09/2019.  ? 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  ? 8. The aortic valve is tricuspid. There is moderate calcification of the  ?aortic valve. There is moderate thickening of the aortic valve.  Aortic  ?valve regurgitation is mild.  ? 9. The inferior vena cava is dilated in size with <50% respiratory  ?variability, suggesting right atrial pressure of 15 mmHg.  ? ?Patient Profile  ?   ? Mr. Shall is an 33M with with moderate CAD, persistent moderate to severe mitral regurgitation after MitraClip, chronic diastolic heart failure, recurrent pleural effusion, hypertension, hyperlipidemia,  and diabetes admitted with acute on chronic diastolic heart failure and elevated troponin.   ? ?Assessment & Plan  ?  ?# Moderate to severe MR:  ?# s/p MitraClip: ?# Acute on chronic diastolic heart failure: ?# HTN:  ?Volume status improved significantly with diuresis.  He is net -7.8 L this admission.  Renal function is stable, but he is very dry on exam and reports having a dry mouth.  He is also a little lightheaded when he stands.  We will hold the Lasix for now.  We were planning to start him on oral today.  If renal function is stable, will start 40 mg of Lasix p.o. tomorrow.  He could likely be discharged tomorrow on his home doses of metoprolol, ramipril, and 40 mg of Lasix with plans for an outpatient BMP next week.  He is not a candidate for any further interventions on his mitral valve.  We will need to manage his volume medically. ? ?# CAD: ?# Demand ischemia:  ?# HL: ?60-70% LAD stenosis.  HS troponin elevated but he denies chest pain.  Likely demand ischemia.  Continue medical management for now.  Continue aspirin, metoprolol and pravastatin. ? ? ?For questions or updates, please contact [place] ?Please consult [email or web address] for contact info under  ? ?  ?   ?Signed, ?Skeet Latch, MD  ?08/12/2021, 12:19 PM   ? ?

## 2021-08-12 NOTE — TOC Progression Note (Signed)
Transition of Care (TOC) - Progression Note  ? ? ?Patient Details  ?Name: Adrian Olson ?MRN: [id number] ?Date of Birth: [birth year] ? ?Transition of Care (TOC) CM/SW Contact  ?Purcell Mouton, RN ?Phone Number: ?08/12/2021, 2:48 PM ? ?Clinical Narrative:    ? ?Pt is active with [facility].  ? ?  ?  ? ?Expected Discharge Plan and Services ?  ?  ?  ?  ?  ?                ?  ?  ?  ?  ?  ?  ?  ?  ?  ?  ? ? ?Social Determinants of Health (SDOH) Interventions ?  ? ?Readmission Risk Interventions ?   ? View : No data to display.  ?  ?  ?  ? ? ?

## 2021-08-12 NOTE — Progress Notes (Signed)
[facility] ? ?PROGRESS NOTE ? ?Adrian Olson IAX:[id number] DOB: 11-26-1930 DOA: 08/08/2021 ?PCP: Adrian Seashore, MD ? ? ?Brief HPI:   ?88 year old male with a history of diastolic CHF with severe mitral regurgitation despite MitraClip, came to ED with worsening shortness of breath and fatigue over past few days.  Patient follows with cardiology Adrian Olson, Lasix was reintroduced.  Patient has noted increasing swelling of lower extremities with shortness of breath on exertion.  Patient was noted and cardiology was consulted. ? ? ? ?Subjective  ? ?Patient seen and examined, denies any complaints.  Blood pressure has been low this morning.  Lasix was held for today by cardiology. ? ? Assessment/Plan:  ? ? ? ?Acute on chronic diastolic heart failure with severe mitral regurgitation ?-Patient was found to have elevated BNP 2976 ?-Started on IV Lasix 40 mg IV every 12 hours ?-Diuresed well with Lasix; net -5.5 L ?-Cardiology following ?-Patient is euvolemic, likely will be switched to p.o. Lasix from tomorrow ?-Lasix is currently on hold as patient blood pressure is low. ? ?Elevated troponin ?-Troponin has been elevated due to demand ischemia ?-Might need ischemic work-up ?-As per cardiology, continue aspirin ? ?History of PSVT ?-Continue Lopressor ?-Continue monitoring on telemetry ? ?Hypertension ?-Continue ramipril, Lopressor ?-We will hold these medications today as patient blood pressure is low ? ?Hyperlipidemia ?-Continue Pravachol ? ?Diabetes mellitus type 2 ?-Continue sliding scale insulin with NovoLog ?-CBG well controlled ? ?CKD stage III ?-Creatinine at baseline ? ?Hypothyroidism ?-TSH and free T4 are both elevated. ?-We will need to repeat labs as outpatient in 4 to 6 weeks ?-Continue Synthroid ? ?BPH ?-Continue Proscar, Flomax. ? ?Hypokalemia ?-Potassium will be replaced ?-Follow BMP in am ? ?Depression/anxiety ?-Continue Zoloft. ? 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 ? ? ?Medications ? ?  ? (feeding supplement) PROSource Plus  30 mL Oral BID BM  ? aspirin EC  81 mg Oral Daily  ? enoxaparin (LOVENOX) injection  40 mg Subcutaneous Q24H  ? finasteride  5 mg Oral Q1200  ? guaiFENesin  600 mg Oral BID  ? insulin aspart  0-9 Units Subcutaneous TID WC  ? levothyroxine  100 mcg Oral Daily  ? metoprolol tartrate  25 mg Oral BID  ? multivitamin with minerals  1 tablet Oral Daily  ? pravastatin  20 mg Oral QHS  ? ramipril  5 mg Oral Daily  ? sertraline  50 mg Oral Daily  ? tamsulosin  0.4 mg Oral Q1200  ? ? ? Data Reviewed:  ? ?CBG: ? ?Recent Labs  ?Lab 08/11/21 ?1140 08/11/21 ?1716 08/11/21 ?2107 08/12/21 ?0786 08/12/21 ?1143  ?GLUCAP 168* 158* 192* 155* 162*  ? ? ?SpO2: 97 %  ? ? ?Vitals:  ? 08/12/21 0500 08/12/21 0530 08/12/21 1059 08/12/21 1145  ?BP:  108/79 106/65 (!) 112/55  ?Pulse:  88 65 63  ?Resp:  20  19  ?Temp:  97.8 ?F (36.6 ?C)  97.7 ?F (36.5 ?C)  ?TempSrc:    Oral  ?SpO2:  97%  97%  ?Weight: 53.2 kg     ?Height:      ? ? ? ? ?Data Reviewed: ? ?Basic Metabolic Panel: ?Recent Labs  ?Lab 08/08/21 ?1625 08/08/21 ?2232 08/09/21 ?0330 08/10/21 ?7544 08/11/21 ?9201 08/12/21 ?0413  ?NA 130*  --  133* 135 134* 132*  ?K 4.2  --  3.6 3.4* 3.3* 3.9  ?CL 97*  --  101 100 98 97*  ?CO2 20*  --  21* '26 28 25  '$ ?GLUCOSE 210*  --  171* 144* 132* 132*  ?BUN 32*  --  34* 32* 27* 28*  ?CREATININE 1.33* 1.39* 1.29* 1.18 1.07 0.97  ?CALCIUM 9.0  --  8.4* 8.8* 8.4* 8.7*  ?MG  --  1.7  --  2.0  --   --   ? ? ?CBC: ?Recent Labs  ?Lab 08/08/21 ?1625 08/08/21 ?2232  ?WBC 7.1 7.3  ?HGB 14.2 12.9*  ?HCT 42.7 39.8  ?MCV 92.4 93.2  ?PLT 169 146*  ? ? ?LFT ?Recent Labs  ?Lab 08/08/21 ?1625  ?AST 63*  ?ALT 47*  ?ALKPHOS 77  ?BILITOT 0.8  ?PROT 7.3  ?ALBUMIN 3.8  ? ?  ?Antibiotics: ?Anti-infectives (From admission, onward)  ? ? None  ? ?  ? ? ? ?DVT prophylaxis: Lovenox ? ?Code Status: Full code ? ?Family Communication: No family at bedside ? ? ?CONSULTS cardiology ? ? ?Objective   ? ? ?Physical Examination: ? ? ?General-appears in no acute distress ?Heart-S1-S2, regular, no murmur auscultated ?Lungs-clear to auscultation bilaterally, no wheezing or crackles auscultated ?Abdomen-soft, nontender, no organomegaly ?Extremities-no edema in the lower extremities ?Neuro-alert, oriented x3, no focal deficit noted ? ?Status is: Inpatient: CHF exacerbation ? ? ? ?  ? ? ? ? ? ?Adrian Olson ?  ?[facility] ?If 7PM-7AM, please contact night-coverage at [email or web address], ?Office  [phone] ? ? ?08/12/2021, 2:55 PM  LOS: 4 days  ? ? ? ? ? ? ? ? ? ? ?  ?

## 2021-08-12 NOTE — Progress Notes (Signed)
Patients BP 106/65 at 1058, message to provider sent asking which cardiovascular medications to hold and which to administer. This nurse will wait for MD orders before administering any medications that could potentially decrease the patients BP further.  ?

## 2021-08-13 DIAGNOSIS — I214 Non-ST elevation (NSTEMI) myocardial infarction: Secondary | ICD-10-CM

## 2021-08-13 LAB — GLUCOSE, CAPILLARY
Glucose-Capillary: 115 mg/dL — ABNORMAL HIGH (ref 70–99)
Glucose-Capillary: 129 mg/dL — ABNORMAL HIGH (ref 70–99)
Glucose-Capillary: 182 mg/dL — ABNORMAL HIGH (ref 70–99)
Glucose-Capillary: 197 mg/dL — ABNORMAL HIGH (ref 70–99)
Glucose-Capillary: 199 mg/dL — ABNORMAL HIGH (ref 70–99)

## 2021-08-13 LAB — BASIC METABOLIC PANEL
Anion gap: 7 (ref 5–15)
BUN: 28 mg/dL — ABNORMAL HIGH (ref 8–23)
CO2: 27 mmol/L (ref 22–32)
Calcium: 8.7 mg/dL — ABNORMAL LOW (ref 8.9–10.3)
Chloride: 100 mmol/L (ref 98–111)
Creatinine, Ser: 1.02 mg/dL (ref 0.61–1.24)
GFR, Estimated: >60 mL/min (ref >60)
Glucose, Bld: 126 mg/dL — ABNORMAL HIGH (ref 70–99)
Potassium: 3.6 mmol/L (ref 3.5–5.1)
Sodium: 134 mmol/L — ABNORMAL LOW (ref 135–145)

## 2021-08-13 MED ORDER — METOPROLOL TARTRATE 25 MG PO TABS
12.5000 mg | ORAL_TABLET | Freq: Two times a day (BID) | ORAL | Status: DC
  Administered 2021-08-13 – 2021-08-14 (×3): 12.5 mg via ORAL
  Filled 2021-08-13 (×3): qty 1

## 2021-08-13 MED ORDER — MELATONIN 5 MG PO TABS
5.0000 mg | ORAL_TABLET | Freq: Every evening | ORAL | Status: DC | PRN
  Administered 2021-08-13 (×2): 5 mg via ORAL
  Filled 2021-08-13 (×2): qty 1

## 2021-08-13 MED ORDER — FUROSEMIDE 40 MG PO TABS
40.0000 mg | ORAL_TABLET | Freq: Every day | ORAL | Status: DC
  Administered 2021-08-13 – 2021-08-14 (×2): 40 mg via ORAL
  Filled 2021-08-13 (×2): qty 1

## 2021-08-13 NOTE — TOC Progression Note (Signed)
Transition of Care (TOC) - Progression Note  ? ? ?Patient Details  ?Name: Adrian Olson ?MRN: [id number] ?Date of Birth: May 05, 1931 ? ?Transition of Care (TOC) CM/SW Contact  ?Tawanna Cooler, RN ?Phone Number: ?08/13/2021, 11:04 AM ? ?Clinical Narrative:    ? ?Spoke with patient at bedside. He confirms that he receives [employer] services.  His wife is also on service with them.  States they have both been "in and out of the hospital."  Patient confides that he is a very anxious person, has a history of depression also.  CM allowed patient to express his feelings.  Offered behavioral health/counseling resources and he agreed.  CM gave list of behavioral health offices that might be able to help, but he was disappointed that CM could not give him more.  Apologized that most of these offices work Monday to Friday, and CM unable to schedule an appt for him over the weekend.  He verbalized understanding.  ? ?  ?   ?  ?  ?

## 2021-08-13 NOTE — Progress Notes (Signed)
[facility] ? ?PROGRESS NOTE ? ?Adrian Olson FKC:[id number] DOB: 03-23-1931 DOA: 08/08/2021 ?PCP: Adrian Seashore, MD ? ? ?Brief HPI:   ?88 year old male with a history of diastolic CHF with severe mitral regurgitation despite MitraClip, came to ED with worsening shortness of breath and fatigue over past few days.  Patient follows with cardiology Dr. Tamala Olson, Lasix was reintroduced.  Patient has noted increasing swelling of lower extremities with shortness of breath on exertion.  Patient was noted and cardiology was consulted. ? ? ? ?Subjective  ? ?Patient seen and examined, feels fatigued. ? ? Assessment/Plan:  ? ? ? ?Acute on chronic diastolic heart failure with severe mitral regurgitation ?-Patient was found to have elevated BNP 2976 ?-Started on IV Lasix 40 mg IV every 12 hours ?-Diuresed well with Lasix; net -5.5 L ?-Cardiology following and has signed off ?-Patient started on p.o. Lasix ?-We will discharge him home tomorrow ? ? ?Elevated troponin ?-Troponin has been elevated due to demand ischemia ?-Might need ischemic work-up ?-As per cardiology, continue aspirin ? ?History of PSVT ?-Continue Lopressor ?-Continue monitoring on telemetry ? ?Hypertension ?-Continue ramipril, Lopressor ?-We will hold these medications today as patient blood pressure is low ? ?Hyperlipidemia ?-Continue Pravachol ? ?Diabetes mellitus type 2 ?-Continue sliding scale insulin with NovoLog ?-CBG well controlled ? ?CKD stage III ?-Creatinine at baseline ? ?Hypothyroidism ?-TSH and free T4 are both elevated. ?-We will need to repeat labs as outpatient in 4 to 6 weeks ?-Continue Synthroid ? ?BPH ?-Continue Proscar, Flomax. ? ?Hypokalemia ?-Potassium will be replaced ?-Follow BMP in am ? ?Depression/anxiety ?-Continue Zoloft. ? 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 ? ? ?Medications ? ?  ? (feeding supplement) PROSource Plus  30 mL Oral BID BM  ? aspirin EC  81 mg Oral Daily  ?  enoxaparin (LOVENOX) injection  40 mg Subcutaneous Q24H  ? finasteride  5 mg Oral Q1200  ? furosemide  40 mg Oral Daily  ? guaiFENesin  600 mg Oral BID  ? insulin aspart  0-9 Units Subcutaneous TID WC  ? levothyroxine  100 mcg Oral Daily  ? metoprolol tartrate  12.5 mg Oral BID  ? multivitamin with minerals  1 tablet Oral Daily  ? pravastatin  20 mg Oral QHS  ? ramipril  5 mg Oral Daily  ? sertraline  50 mg Oral Daily  ? tamsulosin  0.4 mg Oral Q1200  ? ? ? Data Reviewed:  ? ?CBG: ? ?Recent Labs  ?Lab 08/12/21 ?1543 08/12/21 ?2050 08/13/21 ?0019 08/13/21 ?7494 08/13/21 ?1153  ?GLUCAP 110* 278* 182* 129* 199*  ? ? ?SpO2: 97 %  ? ? ?Vitals:  ? 08/12/21 2307 08/13/21 0452 08/13/21 0500 08/13/21 1020  ?BP: 108/76 107/76  118/61  ?Pulse:  61  (!) 58  ?Resp: '16 20  17  '$ ?Temp:  97.7 ?F (36.5 ?C)  97.6 ?F (36.4 ?C)  ?TempSrc:  Oral  Oral  ?SpO2:  96%  97%  ?Weight:   52.8 kg   ?Height:      ? ? ? ? ?Data Reviewed: ? ?Basic Metabolic Panel: ?Recent Labs  ?Lab 08/08/21 ?2232 08/09/21 ?0330 08/10/21 ?4967 08/11/21 ?5916 08/12/21 ?0413 08/13/21 ?3846  ?NA  --  133* 135 134* 132* 134*  ?K  --  3.6 3.4* 3.3* 3.9 3.6  ?CL  --  101 100 98 97* 100  ?CO2  --  21* '26 28 25 27  '$ ?GLUCOSE  --  171* 144* 132* 132*  126*  ?BUN  --  34* 32* 27* 28* 28*  ?CREATININE 1.39* 1.29* 1.18 1.07 0.97 1.02  ?CALCIUM  --  8.4* 8.8* 8.4* 8.7* 8.7*  ?MG 1.7  --  2.0  --   --   --   ? ? ?CBC: ?Recent Labs  ?Lab 08/08/21 ?1625 08/08/21 ?2232  ?WBC 7.1 7.3  ?HGB 14.2 12.9*  ?HCT 42.7 39.8  ?MCV 92.4 93.2  ?PLT 169 146*  ? ? ?LFT ?Recent Labs  ?Lab 08/08/21 ?1625  ?AST 63*  ?ALT 47*  ?ALKPHOS 77  ?BILITOT 0.8  ?PROT 7.3  ?ALBUMIN 3.8  ? ?  ?Antibiotics: ?Anti-infectives (From admission, onward)  ? ? None  ? ?  ? ? ? ?DVT prophylaxis: Lovenox ? ?Code Status: Full code ? ?Family Communication: No family at bedside ? ? ?CONSULTS cardiology ? ? ?Objective  ? ? ?Physical Examination: ? ? ?General-appears in no acute distress ?Heart-S1-S2, regular, no murmur  auscultated ?Lungs-clear to auscultation bilaterally, no wheezing or crackles auscultated ?Abdomen-soft, nontender, no organomegaly ?Extremities-no edema in the lower extremities ?Neuro-alert, oriented x3, no focal deficit noted ? ?Status is: Inpatient: CHF exacerbation ? ? ? ?  ? ? ? ? ? ?Adrian Olson ?  ?[facility] ?If 7PM-7AM, please contact night-coverage at [email or web address], ?Office  [phone] ? ? ?08/13/2021, 2:27 PM  LOS: 5 days  ? ? ? ? ? ? ? ? ? ? ?  ?

## 2021-08-13 NOTE — Progress Notes (Signed)
? ?Progress Note ? ?Patient Name: Adrian Olson ?Date of Encounter: 08/13/2021 ? ?[facility] Cardiologist: [name], MD  ? ?Subjective  ? ?No CP or dyspnea ? ?Inpatient Medications  ?  ?Scheduled Meds: ? (feeding supplement) PROSource Plus  30 mL Oral BID BM  ? aspirin EC  81 mg Oral Daily  ? enoxaparin (LOVENOX) injection  40 mg Subcutaneous Q24H  ? finasteride  5 mg Oral Q1200  ? guaiFENesin  600 mg Oral BID  ? insulin aspart  0-9 Units Subcutaneous TID WC  ? levothyroxine  100 mcg Oral Daily  ? metoprolol tartrate  25 mg Oral BID  ? multivitamin with minerals  1 tablet Oral Daily  ? pravastatin  20 mg Oral QHS  ? ramipril  5 mg Oral Daily  ? sertraline  50 mg Oral Daily  ? tamsulosin  0.4 mg Oral Q1200  ? ?Continuous Infusions: ? sodium chloride Stopped (08/09/21 1130)  ? ?PRN Meds: ?sodium chloride, acetaminophen **OR** acetaminophen, albuterol, melatonin  ? ?Vital Signs  ?  ?Vitals:  ? 08/12/21 2052 08/12/21 2307 08/13/21 0452 08/13/21 0500  ?BP: 110/69 108/76 107/76   ?Pulse: 93  61   ?Resp: '20 16 20   '$ ?Temp: 98 ?F (36.7 ?C)  97.7 ?F (36.5 ?C)   ?TempSrc: Oral  Oral   ?SpO2: 95%  96%   ?Weight:    52.8 kg  ?Height:      ? ? ?Intake/Output Summary (Last 24 hours) at 08/13/2021 0837 ?Last data filed at 08/13/2021 0454 ?Gross per 24 hour  ?Intake 360 ml  ?Output 950 ml  ?Net -590 ml  ? ? ?  08/13/2021  ?  5:00 AM 08/12/2021  ?  5:00 AM 08/10/2021  ?  5:37 AM  ?Last 3 Weights  ?Weight (lbs) 116 lb 4.8 oz 117 lb 4.6 oz 123 lb 7.3 oz  ?Weight (kg) 52.753 kg 53.2 kg 56 kg  ?   ? ?Telemetry  ?  ?Sinus with pacs and pvcs - Personally Reviewed ? ? ? ?Physical Exam  ? ?GEN: No acute distress.   ?Neck: No JVD ?Cardiac: RRR, no murmurs, rubs, or gallops.  ?Respiratory: Clear to auscultation bilaterally. ?GI: Soft, nontender, non-distended  ?MS: No edema ?Neuro:  Nonfocal  ?Psych: Normal affect  ? ?Labs  ?  ?High Sensitivity Troponin:   ?Recent Labs  ?Lab 08/08/21 ?1625 08/08/21 ?1818 08/08/21 ?2232 08/08/21 ?2313   ?TROPONINIHS 268* 344* 386* 402*  ?   ?Chemistry ?Recent Labs  ?Lab 08/08/21 ?1625 08/08/21 ?2232 08/09/21 ?0330 08/10/21 ?3244 08/11/21 ?0102 08/12/21 ?0413 08/13/21 ?7253  ?NA 130*  --    < > 135 134* 132* 134*  ?K 4.2  --    < > 3.4* 3.3* 3.9 3.6  ?CL 97*  --    < > 100 98 97* 100  ?CO2 20*  --    < > '26 28 25 27  '$ ?GLUCOSE 210*  --    < > 144* 132* 132* 126*  ?BUN 32*  --    < > 32* 27* 28* 28*  ?CREATININE 1.33* 1.39*   < > 1.18 1.07 0.97 1.02  ?CALCIUM 9.0  --    < > 8.8* 8.4* 8.7* 8.7*  ?MG  --  1.7  --  2.0  --   --   --   ?PROT 7.3  --   --   --   --   --   --   ?ALBUMIN 3.8  --   --   --   --   --   --   ?  AST 63*  --   --   --   --   --   --   ?ALT 47*  --   --   --   --   --   --   ?ALKPHOS 77  --   --   --   --   --   --   ?BILITOT 0.8  --   --   --   --   --   --   ?GFRNONAA 51* 49*   < > 59* >60 >60 >60  ?ANIONGAP 13  --    < > '9 8 10 7  '$ ? < > = values in this interval not displayed.  ?  ?Hematology ?Recent Labs  ?Lab 08/08/21 ?1625 08/08/21 ?2232  ?WBC 7.1 7.3  ?RBC 4.62 4.27  ?HGB 14.2 12.9*  ?HCT 42.7 39.8  ?MCV 92.4 93.2  ?MCH 30.7 30.2  ?MCHC 33.3 32.4  ?RDW 16.9* 17.0*  ?PLT 169 146*  ? ?Thyroid  ?Recent Labs  ?Lab 08/08/21 ?2232 08/09/21 ?0751  ?TSH 4.998*  --   ?FREET4  --  1.86*  ?  ?BNP ?Recent Labs  ?Lab 08/08/21 ?1625  ?BNP 2,976.1*  ?  ? ?Patient Profile  ?   ? Adrian Olson is an 6M with with moderate CAD, persistent moderate to severe mitral regurgitation after MitraClip, chronic diastolic heart failure, recurrent pleural effusion, hypertension, hyperlipidemia, and diabetes admitted with acute on chronic diastolic heart failure and elevated troponin.  Echo cardiogram in December 2022 showed normal LV function, mild left ventricular hypertrophy, moderate biatrial enlargement, previous mitral clip with moderate mitral regurgitation and mean gradient 4 mmHg, mild aortic insufficiency. ? ?Assessment & Plan  ?  ?1 acute on chronic diastolic congestive heart failure-volume status has improved.   We will resume Lasix 40 mg daily.  Check potassium and renal function in 1 week.  Can consider addition of Wilder Glade as an outpatient if renal function remains stable. ? ?2 status post MitraClip-moderate mitral regurgitation on most recent echocardiogram. ? ?3 hypertension-patient's blood pressure is controlled.  Continue present medical regimen. ? ?4 coronary artery disease-continue aspirin and statin. ? ?Patient can be discharged from a cardiac standpoint on present medications but he prefers to stay 1 more day to ambulate and regain his strength.  We will arrange follow-up in 2 weeks.  Would check potassium and renal function 1 week after discharge.  Please call with questions. ? ?For questions or updates, please contact [place] ?Please consult [email or web address] for contact info under  ? ?  ?   ?Signed, ?Kirk Ruths, MD  ?08/13/2021, 8:37 AM   ? ?

## 2021-08-14 DIAGNOSIS — I5021 Acute systolic (congestive) heart failure: Secondary | ICD-10-CM

## 2021-08-14 LAB — GLUCOSE, CAPILLARY
Glucose-Capillary: 155 mg/dL — ABNORMAL HIGH (ref 70–99)
Glucose-Capillary: 160 mg/dL — ABNORMAL HIGH (ref 70–99)

## 2021-08-14 MED ORDER — METOPROLOL TARTRATE 25 MG PO TABS: 12.5000 mg | ORAL_TABLET | Freq: Two times a day (BID) | ORAL | 3 refills | Status: AC

## 2021-08-14 NOTE — Plan of Care (Signed)

## 2021-08-14 NOTE — Discharge Summary (Signed)
?Physician Discharge Summary ?  ?Patient: Adrian Olson MRN: [id number] DOB: 31-Jul-1930  ?Admit date:     08/08/2021  ?Discharge date: 08/14/21  ?Discharge Physician: [name]  ? ?PCP: Merrilee Seashore, MD  ? ?Recommendations at discharge:  ? ?Follow-up cardiology as outpatient ?Follow-up PCP in 2 weeks ?Check TSH, T4 as outpatient in 4 to 6 weeks ? ?Discharge Diagnoses: ?Principal Problem: ?  Acute CHF (congestive heart failure) ([place]) ?Active Problems: ?  Essential hypertension ?  S/P mitral valve clip implantation ?  Recurrent pleural effusion ?  Hypothyroidism ?  Type 2 diabetes mellitus with hyperlipidemia ([place]) ?  Stage 3b chronic kidney disease (CKD) ([place]) ?  NSTEMI (non-ST elevated myocardial infarction) ([place]) ?  Protein-calorie malnutrition, severe ? ?Resolved Problems: ?  * No resolved hospital problems. * ? ?Hospital Course: ? ?88 year old male with a history of diastolic CHF with severe mitral regurgitation despite MitraClip, came to ED with worsening shortness of breath and fatigue over past few days.  Patient follows with cardiology Dr. Tamala Julian, Lasix was reintroduced.  Patient has noted increasing swelling of lower extremities with shortness of breath on exertion.  Patient was noted and cardiology was consulted. ? ?Assessment and Plan: ? ? ?Acute on chronic diastolic heart failure with severe mitral regurgitation ?-Significantly improved, back to baseline ?-Patient was found to have elevated BNP 2976 ?-Started on IV Lasix 40 mg IV every 12 hours ?-Diuresed well with Lasix; net -5.5 L ?-Cardiology following and has signed off ?-Patient started on p.o. Lasix 40 mg daily ?-Follow-up cardiology as outpatient ?  ?  ?Elevated troponin ?-Troponin has been elevated due to demand ischemia ?-Might need ischemic work-up ?-As per cardiology, continue aspirin ?  ?History of PSVT ?-Continue Lopressor ? ?  ?Hypertension ?-Continue ramipril, Lopressor ? ?  ?Hyperlipidemia ?-Continue Pravachol ?  ?Diabetes  mellitus type 2 ?-Continue home regimen ?  ?CKD stage III ?-Creatinine at baseline ?  ?Hypothyroidism ?-TSH and free T4 are both elevated. ?-We will need to repeat labs as outpatient in 4 to 6 weeks ?-Continue Synthroid ?  ?BPH ?-Continue Proscar, Flomax. ?  ?Hypokalemia ?-Replete ? ?Depression/anxiety ?-Continue Zoloft. ?  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 ?  ? ? ?Consultants: Cardiology ?Procedures performed:  ?Disposition: Home ?Diet recommendation:  ?Discharge Diet Orders (From admission, onward)  ? ?  Start     Ordered  ? 08/14/21 0000  Diet - low sodium heart healthy       ? 08/14/21 1028  ? ?  ?  ? ?  ? ?Carb modified diet ?DISCHARGE MEDICATION: ?Allergies as of 08/14/2021   ? ?   Reactions  ? Duloxetine Hcl Other (See Comments)  ? withdrawal  ? ?  ? ?  ?Medication List  ?  ? ?TAKE these medications   ? ?albuterol 108 (90 Base) MCG/ACT inhaler ?Commonly known as: VENTOLIN HFA ?Inhale 2 puffs into the lungs every 6 (six) hours as needed for wheezing or shortness of breath. ?  ?aspirin 81 MG EC tablet ?Take 81 mg by mouth daily. ?  ?Bevespi Aerosphere 9-4.8 MCG/ACT Aero ?Generic drug: Glycopyrrolate-Formoterol ?Inhale 2 puffs into the lungs 2 (two) times daily. ?  ?cetirizine 10 MG tablet ?Commonly known as: ZyrTEC Allergy ?Take 1 tablet (10 mg total) by mouth daily. ?  ?finasteride 5 MG tablet ?Commonly known as: PROSCAR ?Take 5 mg by mouth daily at 12 noon. ?  ?fluticasone 50 MCG/ACT nasal spray ?Commonly  known as: FLONASE ?Place 2 sprays into both nostrils daily. ?What changed:  ?when to take this ?reasons to take this ?  ?furosemide 40 MG tablet ?Commonly known as: LASIX ?Take 1 tablet (40 mg total) by mouth every Monday, Wednesday, and Friday. ?  ?levothyroxine 100 MCG tablet ?Commonly known as: SYNTHROID ?Take 1 tablet by mouth daily. ?  ?metFORMIN 500 MG tablet ?Commonly known as: GLUCOPHAGE ?Take 500 mg by mouth every evening. ?   ?metoprolol tartrate 25 MG tablet ?Commonly known as: LOPRESSOR ?Take 0.5 tablets (12.5 mg total) by mouth 2 (two) times daily. ?What changed: Another medication with the same name was removed. Continue taking this medication, and follow the directions you see here. ?  ?OneTouch Verio test strip ?Generic drug: glucose blood ?See admin instructions. ?  ?potassium chloride SA 20 MEQ tablet ?Commonly known as: KLOR-CON M ?Take 0.5 tablets (10 mEq total) by mouth every Monday, Wednesday, and Friday. ?  ?pravastatin 20 MG tablet ?Commonly known as: PRAVACHOL ?Take 20 mg by mouth at bedtime. ?  ?ramipril 5 MG capsule ?Commonly known as: ALTACE ?Take 5 mg by mouth daily. ?  ?sertraline 50 MG tablet ?Commonly known as: ZOLOFT ?Take 50 mg by mouth daily. ?  ?tamsulosin 0.4 MG Caps capsule ?Commonly known as: FLOMAX ?Take 0.4 mg by mouth daily. ?  ?Vitamin B-12 2000 MCG Tbcr ?Take 1 tablet by mouth daily. ?  ?VITAMIN B1 PO ?Take 125 mcg by mouth daily. ?  ?Vitamin D 50 MCG (2000 UT) tablet ?Take 2,000 Units by mouth daily at 12 noon. ?  ? ?  ? ? ?Discharge Exam: ?Filed Weights  ? 08/12/21 0500 08/13/21 0500 08/14/21 0600  ?Weight: 53.2 kg 52.8 kg 54.5 kg  ? ?General-appears in no acute distress ?Heart-S1-S2, regular, no murmur auscultated ?Lungs-clear to auscultation bilaterally, no wheezing or crackles auscultated ?Abdomen-soft, nontender, no organomegaly ?Extremities-no edema in the lower extremities ?Neuro-alert, oriented x3, no focal deficit noted ? ?Condition at discharge: good ? ?The results of significant diagnostics from this hospitalization (including imaging, microbiology, ancillary and laboratory) are listed below for reference.  ? ?Imaging Studies: ?DG Chest 2 View ? ?Result Date: 08/08/2021 ?CLINICAL DATA:  Shortness of breath and weakness x2 days. EXAM: CHEST - 2 VIEW COMPARISON:  July 28, 2021 FINDINGS: The lungs are mildly hyperinflated. Mild left basilar atelectasis is seen. This is mildly decreased in  severity when compared to the prior exam. Mild to moderate severity right basilar infiltrate is noted. This is new when compared to the prior study. Small, stable bilateral pleural effusions are noted. No pneumothorax is identified. The cardiac silhouette is enlarged and unchanged in size. The visualized skeletal structures are unremarkable. IMPRESSION: 1. Mild to moderate severity right basilar infiltrate. 2. Small, stable bilateral pleural effusions. 3. Mild left basilar atelectasis, mildly decreased in severity when compared to the prior study. Electronically Signed   By: Virgina Norfolk M.D.   On: 08/08/2021 17:15  ? ?DG Chest 2 View ? ?Result Date: 07/28/2021 ?CLINICAL DATA:  Follow-up after thoracentesis EXAM: CHEST - 2 VIEW COMPARISON:  07/12/2021 chest radiograph. FINDINGS: Mitral valve prosthesis in place. Stable cardiomediastinal silhouette with mild cardiomegaly. No pneumothorax. Trace right pleural effusion, decreased. Small left pleural effusion, stable. No overt pulmonary edema. Improved right lung base aeration. Persistent streaky left lung base atelectasis, mildly worsened. IMPRESSION: 1. Trace right pleural effusion, decreased. Improved right lung base aeration. No pneumothorax. 2. Stable small left pleural effusion. 3. Persistent streaky left lung base atelectasis, mildly worsened.  4. Stable mild cardiomegaly without overt pulmonary edema. Electronically Signed   By: Ilona Sorrel M.D.   On: 07/28/2021 12:42  ? ?CT CHEST HIGH RESOLUTION ? ?Result Date: 07/26/2021 ?CLINICAL DATA:  Dyspnea. History of pleural effusions. Concern for interstitial lung disease. EXAM: CT CHEST WITHOUT CONTRAST TECHNIQUE: Multidetector CT imaging of the chest was performed following the standard protocol without intravenous contrast. High resolution imaging of the lungs, as well as inspiratory and expiratory imaging, was performed. RADIATION DOSE REDUCTION: This exam was performed according to the departmental  dose-optimization program which includes automated exposure control, adjustment of the mA and/or kV according to patient size and/or use of iterative reconstruction technique. COMPARISON:  06/23/2021 chest CT.  07/12/2021 chest radiog

## 2021-08-14 NOTE — Progress Notes (Signed)
Dr. Stanford Breed recommended follow-up as outlined in his note.The patient is noted to already have a pre-existing appointment with Dr. Tamala Julian scheduled 4/4. Per d/w Dr. Stanford Breed, will keep this appointment as scheduled; labs can be arranged for 1 week at that visit as recommended by MD when he sees patient. Nurse states she just did education with patient and they are aware of appt as scheduled. ?

## 2021-08-15 NOTE — Progress Notes (Signed)
?Cardiology Office Note:   ? ?Date:  08/16/2021  ? ?ID:  Adrian Olson, DOB Jun 09, 1930, MRN [id number] ? ?PCP:  Adrian Seashore, MD  ?Cardiologist:  Adrian Grooms, MD  ? ?Referring MD: Adrian Seashore, MD  ? ?Chief Complaint  ?Patient presents with  ? Congestive Heart Failure  ? Cardiac Valve Problem  ?  Mitral regurgitation  ? Advice Only  ?  Hospice and palliative care conversation  ? ? ?History of Present Illness:   ? ?Adrian Olson is a 88 y.o. male with a hx of degenerative mitral valve disease, severe mitral regurgitation, hypertension, and bilateral carotid artery disease.  Also has hyperlipidemia, hypertension, ED, and Transcatheter EER  (MitraClip 10/09/19) with persistent moderate to severe mitral regurgitation. ? ? ?Discharge from the hospital on 08/14/2021 after being admitted with weight gain and shortness of breath.  Discharge weight was 52.8 kg (116 pounds).  Weight today is 120 pounds.  Discharge Lasix dose was 40 mg Monday, Wednesday, and Friday.  He did fairly well yesterday.  He has had no furosemide today and is again short of breath, coughing, and feeling terrible.  Has no orthopnea.  No lower extremity swelling. ? ?His daughter is here today.  Wife is at home, debilitated as is Adrian Olson. ? ? ?There has been 3 hospitalizations since December for decompensated heart failure with pleural effusions, weakness, and dyspnea.  Appetite is decreasing.  Progressive loss of muscle mass. ? ? ?Today we had conversation concerning end-of-life.  I revealed to the patient and the daughter that the prognosis is not good and that we have no further solid management options other than making relatively frequent adjustments in diuretic therapy based upon his intake and daily weights.  This is all due to torrential mitral regurgitation related to degenerative mitral valve disease.  He is not a candidate for surgery.  He had MitraClip and it did not help.  He was not felt to be a candidate for repeat  MitraClip. ? ? ?I have recommended palliative care consultation and if after consideration he is amenable, hospice would be appropriate to help him remain at home and comfortable for his remaining days.  After some consideration, he looks forward to that opportunity to think about management strategy for the future. ? ?Past Medical History:  ?Diagnosis Date  ? Bilateral carotid artery disease ([place])   ? Severe Left and moderate Right  ? Diabetes mellitus without complication ([place])   ? ED (erectile dysfunction)   ? Hyperlipidemia   ? Hypertension   ? Lumbar disc disease   ? Mitral valve prolapse   ? moderate to severe MR, LVEF 55% Echo 2010  ? Moderate mitral regurgitation 03/16/2014  ? S/P mitral valve clip implantation 10/09/2019  ? s/p TEER using 2 MitraClip XTW devices, first device positioned A2 P2, second device positioned A1 P1 by Dr. Burt Knack  ? ? ?Past Surgical History:  ?Procedure Laterality Date  ? CARDIAC CATHETERIZATION    ? MITRAL VALVE REPAIR  10/09/2019  ? MITRAL VALVE REPAIR (N/A )  ? MITRAL VALVE REPAIR N/A 10/09/2019  ? Procedure: MITRAL VALVE REPAIR;  Surgeon: Sherren Mocha, MD;  Location: [facility];  Service: Cardiovascular;  Laterality: N/A;  ? RIGHT/LEFT HEART CATH AND CORONARY ANGIOGRAPHY N/A 09/16/2019  ? Procedure: RIGHT/LEFT HEART CATH AND CORONARY ANGIOGRAPHY;  Surgeon: Belva Crome, MD;  Location: [facility];  Service: Cardiovascular;  Laterality: N/A;  ? TEE WITHOUT CARDIOVERSION N/A 09/16/2019  ?  Procedure: TRANSESOPHAGEAL ECHOCARDIOGRAM (TEE);  Surgeon: Lelon Perla, MD;  Location: [facility] ENDOSCOPY;  Service: Cardiovascular;  Laterality: N/A;  ? ? ?Current Medications: ?Current Meds  ?Medication Sig  ? albuterol (VENTOLIN HFA) 108 (90 Base) MCG/ACT inhaler Inhale 2 puffs into the lungs every 6 (six) hours as needed for wheezing or shortness of breath.  ? aspirin 81 MG EC tablet Take 81 mg by mouth daily.  ? cetirizine (ZYRTEC ALLERGY) 10 MG tablet Take 1 tablet (10 mg  total) by mouth daily.  ? Cholecalciferol (VITAMIN D) 2000 UNITS tablet Take 2,000 Units by mouth daily at 12 noon.   ? finasteride (PROSCAR) 5 MG tablet Take 5 mg by mouth daily at 12 noon.   ? fluticasone (FLONASE) 50 MCG/ACT nasal spray Place 2 sprays into both nostrils daily. (Patient taking differently: Place 2 sprays into both nostrils daily as needed for allergies.)  ? furosemide (LASIX) 20 MG tablet Take one tablet by mouth every other day, alternating with '40mg'$  once daily every other day  ? Glycopyrrolate-Formoterol (BEVESPI AEROSPHERE) 9-4.8 MCG/ACT AERO Inhale 2 puffs into the lungs 2 (two) times daily.  ? levothyroxine (SYNTHROID) 100 MCG tablet Take 1 tablet by mouth daily.  ? metFORMIN (GLUCOPHAGE) 500 MG tablet Take 500 mg by mouth every evening.   ? metoprolol tartrate (LOPRESSOR) 25 MG tablet Take 0.5 tablets (12.5 mg total) by mouth 2 (two) times daily.  ? ONETOUCH VERIO test strip See admin instructions.  ? potassium chloride SA (KLOR-CON M) 20 MEQ tablet Take 0.5 tablets (10 mEq total) by mouth every Monday, Wednesday, and Friday.  ? pravastatin (PRAVACHOL) 20 MG tablet Take 20 mg by mouth at bedtime.   ? ramipril (ALTACE) 5 MG capsule Take 5 mg by mouth daily.   ? sertraline (ZOLOFT) 50 MG tablet Take 50 mg by mouth daily.  ? tamsulosin (FLOMAX) 0.4 MG CAPS capsule Take 0.4 mg by mouth daily.  ? Thiamine HCl (VITAMIN B1 PO) Take 125 mcg by mouth daily.  ? [DISCONTINUED] furosemide (LASIX) 40 MG tablet Take 1 tablet (40 mg total) by mouth every Monday, Wednesday, and Friday.  ?  ? ?Allergies:   Duloxetine hcl  ? ?Social History  ? ?Socioeconomic History  ? Marital status: Married  ?  Spouse name: Not on file  ? Number of children: Not on file  ? Years of education: Not on file  ? Highest education level: Not on file  ?Occupational History  ? Not on file  ?Tobacco Use  ? Smoking status: Never  ? Smokeless tobacco: Never  ?Vaping Use  ? Vaping Use: Never used  ?Substance and Sexual Activity  ?  Alcohol use: No  ?  Alcohol/week: 0.0 standard drinks  ? Drug use: No  ? Sexual activity: Not on file  ?Other Topics Concern  ? Not on file  ?Social History Narrative  ? Not on file  ? ?Social Determinants of Health  ? ?Financial Resource Strain: Not on file  ?Food Insecurity: Not on file  ?Transportation Needs: Not on file  ?Physical Activity: Not on file  ?Stress: Not on file  ?Social Connections: Not on file  ?  ? ?Family History: ?The patient's family history includes Cancer in his mother; Dementia in his sister; Diabetes in his father and sister; Heart disease in his father and sister; Obesity in his sister; Other in his father and sister; Stroke in his father. ? ?ROS:   ?Please see the history of present illness.    ?Weakness.  Decreased appetite.  Cough.  Feels cold all the time.  Urgency.  "Low volume urination.  All other systems reviewed and are negative. ? ?EKGs/Labs/Other Studies Reviewed:   ? ?The following studies were reviewed today: ? ?ECHOCARDIOGRAM 04/2021 ?IMPRESSIONS  ? 1. Left ventricular ejection fraction, by estimation, is 55 to 60%. Left  ?ventricular ejection fraction by 3D volume is 56 %. The left ventricle has  ?normal function. The left ventricle has no regional wall motion  ?abnormalities. There is mild concentric  ?left ventricular hypertrophy. Left ventricular diastolic function could  ?not be evaluated. Elevated left atrial pressure.  ? 2. Right ventricular systolic function is normal. The right ventricular  ?size is normal. Tricuspid regurgitation signal is inadequate for assessing  ?PA pressure.  ? 3. Left atrial size was moderately dilated.  ? 4. Right atrial size was moderately dilated.  ? 5. Moderate pleural effusion in the left lateral region.  ? 6. The mitral valve has been repaired/replaced. Moderate mitral valve  ?regurgitation. The mean mitral valve gradient is 4.0 mmHg with average  ?heart rate of 58 bpm. There is a Mitra-Clip present in the mitral  ?position. Procedure  Date: 10/09/2019.  ? 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  ? 8. The aortic valve is tricuspid. There is moderate calcification of the  ?aortic valve. There is moderate thickening of the aortic valve. Aortic  ?valv

## 2021-08-16 ENCOUNTER — Encounter: Payer: Self-pay | Admitting: Interventional Cardiology

## 2021-08-16 ENCOUNTER — Ambulatory Visit: Payer: Medicare HMO | Admitting: Interventional Cardiology

## 2021-08-16 VITALS — BP 102/58 | HR 58 | Ht 69.0 in | Wt 120.0 lb

## 2021-08-16 DIAGNOSIS — I10 Essential (primary) hypertension: Secondary | ICD-10-CM | POA: Diagnosis not present

## 2021-08-16 DIAGNOSIS — I5032 Chronic diastolic (congestive) heart failure: Secondary | ICD-10-CM

## 2021-08-16 DIAGNOSIS — E785 Hyperlipidemia, unspecified: Secondary | ICD-10-CM | POA: Diagnosis not present

## 2021-08-16 DIAGNOSIS — Z7189 Other specified counseling: Secondary | ICD-10-CM

## 2021-08-16 DIAGNOSIS — Z95818 Presence of other cardiac implants and grafts: Secondary | ICD-10-CM | POA: Diagnosis not present

## 2021-08-16 DIAGNOSIS — Z9889 Other specified postprocedural states: Secondary | ICD-10-CM | POA: Diagnosis not present

## 2021-08-16 DIAGNOSIS — I471 Supraventricular tachycardia: Secondary | ICD-10-CM

## 2021-08-16 MED ORDER — FUROSEMIDE 20 MG PO TABS: ORAL_TABLET | ORAL | 3 refills | Status: AC

## 2021-08-16 NOTE — Patient Instructions (Signed)
Medication Instructions:  ?1) We are changing the way you take your Furosemide.  When you get home, we want you to go ahead and take Furosemide '40mg'$  today.  Tomorrow you will take '20mg'$ .  We will continue with these alternating doses from here on out.  So you will take '40mg'$  one day, '20mg'$  the next. ? ?*If you need a refill on your cardiac medications before your next appointment, please call your pharmacy* ? ? ?Lab Work: ?None ?If you have labs (blood work) drawn today and your tests are completely normal, you will receive your results only by: ?MyChart Message (if you have MyChart) OR ?A paper copy in the mail ?If you have any lab test that is abnormal or we need to change your treatment, we will call you to review the results. ? ? ?Testing/Procedures: ?None ? ? ?Follow-Up: ?At [facility], you and your health needs are our priority.  As part of our continuing mission to provide you with exceptional heart care, we have created designated Provider Care Teams.  These Care Teams include your primary Cardiologist (physician) and Advanced Practice Providers (APPs -  Physician Assistants and Nurse Practitioners) who all work together to provide you with the care you need, when you need it. ? ?We recommend signing up for the patient portal called "MyChart".  Sign up information is provided on this After Visit Summary.  MyChart is used to connect with patients for Virtual Visits (Telemedicine).  Patients are able to view lab/test results, encounter notes, upcoming appointments, etc.  Non-urgent messages can be sent to your provider as well.   ?To learn more about what you can do with MyChart, go to [email or web address].   ? ?Your next appointment:   ?1 month(s) ? ?The format for your next appointment:   ?In Person ? ?Provider:   ?[name], MD  ? ? ?Other Instructions ?  ?

## 2021-08-17 ENCOUNTER — Telehealth: Payer: Self-pay | Admitting: Interventional Cardiology

## 2021-08-17 ENCOUNTER — Telehealth: Payer: Self-pay

## 2021-08-17 DIAGNOSIS — I130 Hypertensive heart and chronic kidney disease with heart failure and stage 1 through stage 4 chronic kidney disease, or unspecified chronic kidney disease: Secondary | ICD-10-CM | POA: Diagnosis not present

## 2021-08-17 DIAGNOSIS — E785 Hyperlipidemia, unspecified: Secondary | ICD-10-CM | POA: Diagnosis not present

## 2021-08-17 DIAGNOSIS — S2241XD Multiple fractures of ribs, right side, subsequent encounter for fracture with routine healing: Secondary | ICD-10-CM | POA: Diagnosis not present

## 2021-08-17 DIAGNOSIS — E039 Hypothyroidism, unspecified: Secondary | ICD-10-CM | POA: Diagnosis not present

## 2021-08-17 DIAGNOSIS — I5033 Acute on chronic diastolic (congestive) heart failure: Secondary | ICD-10-CM | POA: Diagnosis not present

## 2021-08-17 DIAGNOSIS — N1831 Chronic kidney disease, stage 3a: Secondary | ICD-10-CM | POA: Diagnosis not present

## 2021-08-17 DIAGNOSIS — E1122 Type 2 diabetes mellitus with diabetic chronic kidney disease: Secondary | ICD-10-CM | POA: Diagnosis not present

## 2021-08-17 DIAGNOSIS — R69 Illness, unspecified: Secondary | ICD-10-CM | POA: Diagnosis not present

## 2021-08-17 DIAGNOSIS — S272XXD Traumatic hemopneumothorax, subsequent encounter: Secondary | ICD-10-CM | POA: Diagnosis not present

## 2021-08-17 NOTE — Telephone Encounter (Signed)
I spoke with Adrian Olson with [facility]... and with Dr. Tamala Julian... the pt is having increased light headedness, BP 75/52 HR 130, no fever... he has had increased diarrhea today and not urinating much.. per Dr. Tamala Julian... she will reach out to his PCP.  ?

## 2021-08-17 NOTE — Telephone Encounter (Signed)
Delana Meyer, RN from [employer] called to ask if Dr. Tamala Julian would be the attending for pt's Hospice Care and if he felt pt had 6 months or less.  Spoke with Dr. Tamala Julian and he said no to attending (PCP or Hospice doctor to do) and yes to only 6 mos or less.  Called Jasmine back and made her aware.  She was appreciative for call.  ?

## 2021-08-17 NOTE — Telephone Encounter (Signed)
Attempted to contact patient's daughter [name] to schedule a Palliative Care consult appointment. No answer left a message to return call.  ? ?

## 2021-08-17 NOTE — Telephone Encounter (Signed)
STAT if patient feels like he/she is going to faint  ? ?Are you dizzy now?  ?Not currently  ? ?Do you feel faint or have you passed out?  ?No, but whenever patient stands up and walks a short distance he becomes faint  ? ?Do you have any other symptoms?  ? ? ?Have you checked your HR and BP (record if available)?  ?BP 75/52  ?HR 130 ? ?

## 2021-08-18 ENCOUNTER — Inpatient Hospital Stay (HOSPITAL_BASED_OUTPATIENT_CLINIC_OR_DEPARTMENT_OTHER)
Admission: EM | Admit: 2021-08-18 | Discharge: 2021-08-22 | DRG: 871 | Disposition: A | Discharge status: Hospice | Payer: Medicare HMO | Attending: Internal Medicine | Admitting: Internal Medicine

## 2021-08-18 ENCOUNTER — Other Ambulatory Visit: Payer: Self-pay

## 2021-08-18 ENCOUNTER — Emergency Department (HOSPITAL_BASED_OUTPATIENT_CLINIC_OR_DEPARTMENT_OTHER): Payer: Medicare HMO

## 2021-08-18 ENCOUNTER — Emergency Department (HOSPITAL_BASED_OUTPATIENT_CLINIC_OR_DEPARTMENT_OTHER): Payer: Medicare HMO | Admitting: Radiology

## 2021-08-18 ENCOUNTER — Encounter (HOSPITAL_BASED_OUTPATIENT_CLINIC_OR_DEPARTMENT_OTHER): Payer: Self-pay | Admitting: Emergency Medicine

## 2021-08-18 DIAGNOSIS — E039 Hypothyroidism, unspecified: Secondary | ICD-10-CM | POA: Diagnosis present

## 2021-08-18 DIAGNOSIS — E43 Unspecified severe protein-calorie malnutrition: Secondary | ICD-10-CM | POA: Diagnosis not present

## 2021-08-18 DIAGNOSIS — E871 Hypo-osmolality and hyponatremia: Secondary | ICD-10-CM | POA: Diagnosis not present

## 2021-08-18 DIAGNOSIS — A419 Sepsis, unspecified organism: Secondary | ICD-10-CM | POA: Diagnosis not present

## 2021-08-18 DIAGNOSIS — I4891 Unspecified atrial fibrillation: Secondary | ICD-10-CM | POA: Diagnosis present

## 2021-08-18 DIAGNOSIS — R9431 Abnormal electrocardiogram [ECG] [EKG]: Secondary | ICD-10-CM | POA: Diagnosis not present

## 2021-08-18 DIAGNOSIS — R159 Full incontinence of feces: Secondary | ICD-10-CM | POA: Diagnosis present

## 2021-08-18 DIAGNOSIS — Z87440 Personal history of urinary (tract) infections: Secondary | ICD-10-CM

## 2021-08-18 DIAGNOSIS — Z8249 Family history of ischemic heart disease and other diseases of the circulatory system: Secondary | ICD-10-CM

## 2021-08-18 DIAGNOSIS — K761 Chronic passive congestion of liver: Secondary | ICD-10-CM | POA: Diagnosis present

## 2021-08-18 DIAGNOSIS — N390 Urinary tract infection, site not specified: Secondary | ICD-10-CM | POA: Diagnosis present

## 2021-08-18 DIAGNOSIS — N400 Enlarged prostate without lower urinary tract symptoms: Secondary | ICD-10-CM | POA: Diagnosis present

## 2021-08-18 DIAGNOSIS — I340 Nonrheumatic mitral (valve) insufficiency: Secondary | ICD-10-CM | POA: Diagnosis present

## 2021-08-18 DIAGNOSIS — Z66 Do not resuscitate: Secondary | ICD-10-CM | POA: Diagnosis not present

## 2021-08-18 DIAGNOSIS — F32A Depression, unspecified: Secondary | ICD-10-CM | POA: Diagnosis present

## 2021-08-18 DIAGNOSIS — J90 Pleural effusion, not elsewhere classified: Secondary | ICD-10-CM | POA: Diagnosis not present

## 2021-08-18 DIAGNOSIS — Z7189 Other specified counseling: Secondary | ICD-10-CM | POA: Diagnosis not present

## 2021-08-18 DIAGNOSIS — Z95818 Presence of other cardiac implants and grafts: Secondary | ICD-10-CM

## 2021-08-18 DIAGNOSIS — N3001 Acute cystitis with hematuria: Secondary | ICD-10-CM

## 2021-08-18 DIAGNOSIS — Z79890 Hormone replacement therapy (postmenopausal): Secondary | ICD-10-CM

## 2021-08-18 DIAGNOSIS — R6520 Severe sepsis without septic shock: Secondary | ICD-10-CM | POA: Diagnosis present

## 2021-08-18 DIAGNOSIS — E1169 Type 2 diabetes mellitus with other specified complication: Secondary | ICD-10-CM | POA: Diagnosis present

## 2021-08-18 DIAGNOSIS — K5730 Diverticulosis of large intestine without perforation or abscess without bleeding: Secondary | ICD-10-CM | POA: Diagnosis not present

## 2021-08-18 DIAGNOSIS — I9589 Other hypotension: Secondary | ICD-10-CM | POA: Diagnosis not present

## 2021-08-18 DIAGNOSIS — Z8041 Family history of malignant neoplasm of ovary: Secondary | ICD-10-CM

## 2021-08-18 DIAGNOSIS — E785 Hyperlipidemia, unspecified: Secondary | ICD-10-CM | POA: Diagnosis not present

## 2021-08-18 DIAGNOSIS — I110 Hypertensive heart disease with heart failure: Secondary | ICD-10-CM | POA: Diagnosis present

## 2021-08-18 DIAGNOSIS — E1165 Type 2 diabetes mellitus with hyperglycemia: Secondary | ICD-10-CM | POA: Diagnosis present

## 2021-08-18 DIAGNOSIS — Z7982 Long term (current) use of aspirin: Secondary | ICD-10-CM

## 2021-08-18 DIAGNOSIS — R531 Weakness: Secondary | ICD-10-CM | POA: Diagnosis not present

## 2021-08-18 DIAGNOSIS — I4892 Unspecified atrial flutter: Secondary | ICD-10-CM | POA: Diagnosis not present

## 2021-08-18 DIAGNOSIS — Z79899 Other long term (current) drug therapy: Secondary | ICD-10-CM

## 2021-08-18 DIAGNOSIS — F3289 Other specified depressive episodes: Secondary | ICD-10-CM

## 2021-08-18 DIAGNOSIS — Z20822 Contact with and (suspected) exposure to covid-19: Secondary | ICD-10-CM | POA: Diagnosis not present

## 2021-08-18 DIAGNOSIS — I5032 Chronic diastolic (congestive) heart failure: Secondary | ICD-10-CM | POA: Diagnosis present

## 2021-08-18 DIAGNOSIS — Z681 Body mass index (BMI) 19 or less, adult: Secondary | ICD-10-CM | POA: Diagnosis not present

## 2021-08-18 DIAGNOSIS — N3000 Acute cystitis without hematuria: Secondary | ICD-10-CM | POA: Diagnosis not present

## 2021-08-18 DIAGNOSIS — Z515 Encounter for palliative care: Secondary | ICD-10-CM

## 2021-08-18 DIAGNOSIS — I959 Hypotension, unspecified: Secondary | ICD-10-CM | POA: Diagnosis present

## 2021-08-18 DIAGNOSIS — E782 Mixed hyperlipidemia: Secondary | ICD-10-CM | POA: Diagnosis not present

## 2021-08-18 DIAGNOSIS — J00 Acute nasopharyngitis [common cold]: Secondary | ICD-10-CM

## 2021-08-18 DIAGNOSIS — R69 Illness, unspecified: Secondary | ICD-10-CM | POA: Diagnosis not present

## 2021-08-18 DIAGNOSIS — Z7984 Long term (current) use of oral hypoglycemic drugs: Secondary | ICD-10-CM

## 2021-08-18 DIAGNOSIS — N200 Calculus of kidney: Secondary | ICD-10-CM | POA: Diagnosis not present

## 2021-08-18 DIAGNOSIS — Z833 Family history of diabetes mellitus: Secondary | ICD-10-CM

## 2021-08-18 DIAGNOSIS — Z823 Family history of stroke: Secondary | ICD-10-CM

## 2021-08-18 LAB — GLUCOSE, CAPILLARY: Glucose-Capillary: 201 mg/dL — ABNORMAL HIGH (ref 70–99)

## 2021-08-18 LAB — RESP PANEL BY RT-PCR (FLU A&B, COVID) ARPGX2
Influenza A by PCR: NEGATIVE
Influenza B by PCR: NEGATIVE
SARS Coronavirus 2 by RT PCR: NEGATIVE

## 2021-08-18 LAB — CBC
HCT: 41.7 % (ref 39.0–52.0)
Hemoglobin: 13.4 g/dL (ref 13.0–17.0)
MCH: 29.1 pg (ref 26.0–34.0)
MCHC: 32.1 g/dL (ref 30.0–36.0)
MCV: 90.7 fL (ref 80.0–100.0)
Platelets: 239 10*3/uL (ref 150–400)
RBC: 4.6 MIL/uL (ref 4.22–5.81)
RDW: 16.9 % — ABNORMAL HIGH (ref 11.5–15.5)
WBC: 10.1 10*3/uL (ref 4.0–10.5)
nRBC: 0 % (ref 0.0–0.2)

## 2021-08-18 LAB — TROPONIN I (HIGH SENSITIVITY)
Troponin I (High Sensitivity): 118 ng/L (ref <18)
Troponin I (High Sensitivity): 112 ng/L (ref <18)
Troponin I (High Sensitivity): 99 ng/L — ABNORMAL HIGH (ref <18)

## 2021-08-18 LAB — LACTIC ACID, PLASMA
Lactic Acid, Venous: 2.9 mmol/L (ref 0.5–1.9)
Lactic Acid, Venous: 5.6 mmol/L (ref 0.5–1.9)
Lactic Acid, Venous: 2.9 mmol/L (ref 0.5–1.9)
Lactic Acid, Venous: 3.3 mmol/L (ref 0.5–1.9)

## 2021-08-18 LAB — BASIC METABOLIC PANEL
Anion gap: 11 (ref 5–15)
BUN: 23 mg/dL (ref 8–23)
CO2: 25 mmol/L (ref 22–32)
Calcium: 9.5 mg/dL (ref 8.9–10.3)
Chloride: 92 mmol/L — ABNORMAL LOW (ref 98–111)
Creatinine, Ser: 1.21 mg/dL (ref 0.61–1.24)
GFR, Estimated: 58 mL/min — ABNORMAL LOW (ref >60)
Glucose, Bld: 195 mg/dL — ABNORMAL HIGH (ref 70–99)
Potassium: 4.4 mmol/L (ref 3.5–5.1)
Sodium: 128 mmol/L — ABNORMAL LOW (ref 135–145)

## 2021-08-18 LAB — PROTIME-INR
INR: 1.2 (ref 0.8–1.2)
Prothrombin Time: 14.7 seconds (ref 11.4–15.2)

## 2021-08-18 LAB — URINALYSIS, ROUTINE W REFLEX MICROSCOPIC
Bilirubin Urine: NEGATIVE
Glucose, UA: NEGATIVE mg/dL
Ketones, ur: NEGATIVE mg/dL
Nitrite: NEGATIVE
Protein, ur: 30 mg/dL — AB
RBC / HPF: >50 RBC/hpf — ABNORMAL HIGH (ref 0–5)
Specific Gravity, Urine: 1.013 (ref 1.005–1.030)
pH: 5.5 (ref 5.0–8.0)

## 2021-08-18 LAB — TSH: TSH: 7.075 u[IU]/mL — ABNORMAL HIGH (ref 0.350–4.500)

## 2021-08-18 LAB — PROCALCITONIN: Procalcitonin: <0.1 ng/mL

## 2021-08-18 LAB — MAGNESIUM: Magnesium: 1.6 mg/dL — ABNORMAL LOW (ref 1.7–2.4)

## 2021-08-18 LAB — MRSA NEXT GEN BY PCR, NASAL: MRSA by PCR Next Gen: NOT DETECTED

## 2021-08-18 LAB — PHOSPHORUS: Phosphorus: 3.1 mg/dL (ref 2.5–4.6)

## 2021-08-18 MED ORDER — SODIUM CHLORIDE 0.9 % IV SOLN
2.0000 g | INTRAVENOUS | Status: DC
  Administered 2021-08-18 – 2021-08-22 (×5): 2 g via INTRAVENOUS
  Filled 2021-08-18 (×6): qty 20

## 2021-08-18 MED ORDER — METOPROLOL TARTRATE 12.5 MG HALF TABLET
12.5000 mg | ORAL_TABLET | Freq: Two times a day (BID) | ORAL | Status: DC
  Administered 2021-08-19 – 2021-08-22 (×7): 12.5 mg via ORAL
  Filled 2021-08-18 (×8): qty 1

## 2021-08-18 MED ORDER — LACTATED RINGERS IV BOLUS
500.0000 mL | Freq: Once | INTRAVENOUS | Status: AC
  Administered 2021-08-18: 500 mL via INTRAVENOUS

## 2021-08-18 MED ORDER — ASPIRIN EC 81 MG PO TBEC
81.0000 mg | DELAYED_RELEASE_TABLET | Freq: Every day | ORAL | Status: DC
  Administered 2021-08-19 – 2021-08-22 (×4): 81 mg via ORAL
  Filled 2021-08-18 (×4): qty 1

## 2021-08-18 MED ORDER — ORAL CARE MOUTH RINSE
15.0000 mL | Freq: Two times a day (BID) | OROMUCOSAL | Status: DC
  Administered 2021-08-18 – 2021-08-22 (×8): 15 mL via OROMUCOSAL

## 2021-08-18 MED ORDER — INSULIN ASPART 100 UNIT/ML IJ SOLN
0.0000 [IU] | Freq: Three times a day (TID) | INTRAMUSCULAR | Status: DC
  Administered 2021-08-19 – 2021-08-20 (×4): 1 [IU] via SUBCUTANEOUS
  Administered 2021-08-21: 2 [IU] via SUBCUTANEOUS
  Administered 2021-08-21: 1 [IU] via SUBCUTANEOUS
  Administered 2021-08-22: 2 [IU] via SUBCUTANEOUS

## 2021-08-18 MED ORDER — METOPROLOL TARTRATE 5 MG/5ML IV SOLN
2.5000 mg | Freq: Once | INTRAVENOUS | Status: DC
  Filled 2021-08-18: qty 5

## 2021-08-18 MED ORDER — ACETAMINOPHEN 325 MG PO TABS: 650.0000 mg | ORAL_TABLET | Freq: Four times a day (QID) | ORAL | Status: DC | PRN

## 2021-08-18 MED ORDER — CHLORHEXIDINE GLUCONATE CLOTH 2 % EX PADS
6.0000 | MEDICATED_PAD | Freq: Every day | CUTANEOUS | Status: DC
  Administered 2021-08-18 – 2021-08-22 (×5): 6 via TOPICAL

## 2021-08-18 MED ORDER — AMIODARONE IV BOLUS ONLY 150 MG/100ML
150.0000 mg | Freq: Once | INTRAVENOUS | Status: AC
  Administered 2021-08-18: 150 mg via INTRAVENOUS
  Filled 2021-08-18: qty 100

## 2021-08-18 MED ORDER — MAGNESIUM SULFATE 2 GM/50ML IV SOLN
2.0000 g | Freq: Once | INTRAVENOUS | Status: AC
  Administered 2021-08-18: 2 g via INTRAVENOUS
  Filled 2021-08-18: qty 50

## 2021-08-18 MED ORDER — MELATONIN 5 MG PO TABS
5.0000 mg | ORAL_TABLET | Freq: Every evening | ORAL | Status: DC | PRN
  Administered 2021-08-18 – 2021-08-21 (×4): 5 mg via ORAL
  Filled 2021-08-18 (×4): qty 1

## 2021-08-18 MED ORDER — LEVOTHYROXINE SODIUM 100 MCG PO TABS
100.0000 ug | ORAL_TABLET | Freq: Every day | ORAL | Status: DC
  Administered 2021-08-19 – 2021-08-22 (×4): 100 ug via ORAL
  Filled 2021-08-18 (×4): qty 1

## 2021-08-18 MED ORDER — LACTATED RINGERS IV BOLUS (SEPSIS)
1000.0000 mL | Freq: Once | INTRAVENOUS | Status: AC
  Administered 2021-08-18: 1000 mL via INTRAVENOUS

## 2021-08-18 MED ORDER — PRAVASTATIN SODIUM 20 MG PO TABS
20.0000 mg | ORAL_TABLET | Freq: Every day | ORAL | Status: DC
Start: 2021-08-18 — End: 2021-08-22
  Administered 2021-08-18 – 2021-08-21 (×4): 20 mg via ORAL
  Filled 2021-08-18 (×4): qty 1

## 2021-08-18 MED ORDER — ACETAMINOPHEN 650 MG RE SUPP: 650.0000 mg | Freq: Four times a day (QID) | RECTAL | Status: DC | PRN

## 2021-08-18 MED ORDER — FINASTERIDE 5 MG PO TABS
5.0000 mg | ORAL_TABLET | Freq: Every day | ORAL | Status: DC
  Administered 2021-08-19 – 2021-08-22 (×4): 5 mg via ORAL
  Filled 2021-08-18 (×4): qty 1

## 2021-08-18 MED ORDER — LACTATED RINGERS IV SOLN: INTRAVENOUS | Status: DC

## 2021-08-18 NOTE — Assessment & Plan Note (Signed)
? ? ?#)   Type 2 Diabetes Mellitus: documented history of such. Home insulin regimen: None. Home oral hypoglycemic agents: Metformin. presenting blood sugar: 195.  ? ? ?Plan: accuchecks QAC and HS with low dose SSI. hold home oral hypoglycemic agents during this hospitalization.  ? ?

## 2021-08-18 NOTE — Assessment & Plan Note (Signed)
? ?#(  Acute on chronic hypotension: Per chart review, patient's baseline systolic pressures.  90s to low 100s, noting per most recent outpatient cardiology clinic documentation on 08/16/2021, at which time blood pressure of 102/58 was noted, cardiology conveyance via the documentation of persistent hypotension limiting more aggressive diuresis efforts.  Relative to this baseline, systolic pressures were in the 80s at [facility] ED earlier today and tachyarrhythmia felt to represent atrial flutter versus SVT per cardiology consult with Dr Percival Spanish of [facility] cardiology, with ensuing improvement in systolic blood pressures back to his baseline 90s following conversion to sinus rhythm after receiving IV amiodarone bolus.  History and blood pressure as it relates to patient's tachycardia, but he does not tolerate well from a hemodynamic standpoint the presence of the aforementioned tachyarrhythmia.  Potential additional hypotensive contributing factors include presenting severe sepsis in the setting of acute cystitis, as above versus intravascular depletion stemming from recent dose of outpatient Lasix, as further outlined above.  ACS is felt to be less likely given the absence of any recent chest pain, while high-sensitivity troponin I x3 values earlier today continues to trend down relative to most recent prior value of 402 on 08/08/2021.  ? ?He was noted patient cardiologist previously palliative care for assistance with goals of care/CODE STATUS clarification, this consult is not yet taken place.  Consequently, I will place palliative care consult to expedite clarification of these items.  ? ? ?Plan: Monitor on telemetry.  Consider additional IVF prn if subsequent associated tension aforementioned response to previous amiodarone bolus.  Monitor strict I's and O's and daily weights.  Continuous lactated Ringer's.  Monitor continuous pulse oximetry.  Further evaluation management of presenting severe sepsis due to acute  cystitis, as above.  Repeat lactate in the morning.  Repeat CMP and CBC in the morning. Palliative care consult placed for assistance with goals of care/CODE STATUS clarification, as above. ? ?

## 2021-08-18 NOTE — Assessment & Plan Note (Signed)
? ? ?#)   Chronic diastolic heart failure: documented history of such, with most recent echocardiogram performed in December 2022 notable for LVEF 55 to 60%, indeterminate diastolic parameters, will noting moderate mitral regurgitation following previous MitraClip procedure in May 2021. No clinical evidence to suggest acutely decompensated heart failure at this time, although the patient certainly at risk for recurrence of decompensation of his heart failure in the setting of his his moderate to severe regurgitation, although he does appear to be slightly intravascularly dry at this time following recent regulation of outpatient diuretic medication, concomitant with a few episodes of loose stool over the last few days.. home diuretic regimen reportedly consists of the following: Lasix 20 mg every other day as well as Lasix 40 mg every other day.  Of note, chest x-ray shows improvement in pulmonary vascularity as well as improved aeration in the lung bases relative to most recent prior chest x-ray taken on 08/08/2021) recent prior hospitalization for acute on chronic diastolic heart failure. ? ?Plan: monitor strict I's & O's and daily weights. Repeat BMP in AM.  Recheck serum mag level in the AM.  The context of presenting acute on chronic hypotension as well as clinical suspicion for mild intravascular depletion, will hold home diuretic regimen for now, and reevaluate volume status in the morning to assist with decision-making regarding determination of timeframe for resumption of home diuretic medications.   ? ? ?

## 2021-08-18 NOTE — Assessment & Plan Note (Signed)
? ? ?#)   Depression: Documented history of such, on Zoloft as an outpatient.  However, in the setting of preceding prolonged QTc and, will hold home Zoloft for now. ? ?Plan: Hold home Zoloft for now.  Monitor on telemetry.  Repeat EKG in the morning to monitor interval trend in degree of QTc prolongation.  Further evaluation management of presenting hypomagnesemia, as above. ? ? ?

## 2021-08-18 NOTE — Assessment & Plan Note (Signed)
-   see severe sepsis 

## 2021-08-18 NOTE — ED Notes (Addendum)
Per Charlesetta Shanks, MD hold metoprolol until after bolus finished to reevaluate BP.  ?

## 2021-08-18 NOTE — Assessment & Plan Note (Signed)
? ? ?#)   QTc prolongation: Presenting EKG demonstrates QTc of  504  ms. outpatient medications that may be contributing to QTc prolongation: Zoloft.  Additionally, mild hypomagnesemia was noted, with initial serum magnesium level 1.6, status post 2 g IV magnesium sulfate earlier this evening. ? ? ?Plan: Monitor on telemetry.  Repeat serum magnesium level in the morning.  Repeat EKG in the morning to monitor interval degree of QTc prolongation.  Hold outpatient Zoloft. ? ?

## 2021-08-18 NOTE — Assessment & Plan Note (Signed)
? ?#)   acquired hypothyroidism: documented h/o such, on Synthroid as outpatient.  ? ?Plan: cont home Synthroid.  In setting of presenting tachyarrhythmia, also check TSH level. ? ? ? ? ?

## 2021-08-18 NOTE — ED Notes (Signed)
While ambulating pt, pt stated that he "felt fine". Upon returning to the room and being placed in bed, pt stated that he felt "lightheaded". After about 30 seconds of feeling "lightheaded", pt stated that he was feeling better. Pt's O2 during ambulation was between 95%-99%. Pt's HR consistently 129-130.  Pt ambulated per Dr. Gilford Raid. ?

## 2021-08-18 NOTE — Assessment & Plan Note (Signed)
? ?#)  Severe sepsis due to acute cystitis: In the setting of presenting 1 to 2 days of dysuria associated with new onset generalized weakness urinalysis demonstrating significant pyuria, many bacteria, moderate leukocyte esterase, and no evidence discussed epithelial cells to suggest a contaminated specimen, with CT abdomen pelvis showing no evidence of ureteral stones hydronephrosis, or perinephric stranding.  ? ?SIRS criteria met via leukocytosis, although somewhat borderline as well as the presence of tachycardia, tachypnea. Lactic acid level: 2.9. Of note, given the associated presence of suspected end organ damage in the form of concominant presenting elevated lactate, criteria are met for pt's sepsis to be considered severe in nature.  While ensuing lactate trended up to 5.6, which would otherwise warrant administration of a 30 mL/kg IVF bolus, will refrain from administration of this volume of IV fluid bolus given the patient's history of chronic diastolic heart failure, moderate to severe mitral regurgitation resulting in recent hospitalization for acute on chronic diastolic heart failure.  Rather we will proceed with existing continuous IV fluids, considering trending of lactic acid level, as further outlined below.  Relative to his baseline systolic blood pressures in the 90s to low 100s, he was initially hypotensive relative to this baseline, with systolic blood pressures in the 80s.  Considering improvement in blood pressure following conversion tachyarrhythmia to sinus rhythm following IV amiodarone bolus, as further detailed above. ? ?Additional ED work-up/management notable for: Collection of blood cultures x2 followed by initiation of Rocephin, which be continued. ? ?No overt evidence of additional underlying infectious process at this time, including CT abdomen/pelvis that showed no evidence of acute intra-abdominal process, while COVID-19/influenza PCR negative.  However, the proximal portion of  aforementioned CT abdomen/pelvis showed potential bibasilar atelectasis versus infiltrate in the distal lungs.  In the absence of any acute respiratory symptoms, this appears less likely to represent pneumonia.  However, will add on procalcitonin to further evaluate, with potential for further expansion of antibiotic spectrum depending upon this result. ? ? ? ?Plan: CBC w/ diff and CMP in AM.  Follow for results of blood cx's x 2. Abx: Continue Rocephin.  Add on urine culture.  Check procalcitonin level.  Repeat lactate in the morning.  Continuous LR.  Assess for heart rate. ? ? ? ?

## 2021-08-18 NOTE — Plan of Care (Signed)
Originating Facility: [facility] ?Requesting Physician: Dr. Johnney Killian, MD ? ?History: ?Adrian Olson is an 88 year old male with past medical history significant for chronic diastolic congestive heart failure, severe MR, degenerative mitral valve disease, essential hypertension, carotid artery disease, PSVT, essential hypertension, DM2, hypothyroidism, BPH, CKD stage III, depression/anxiety, HLD, stage III right ischial pressure ulcer who presented to [facility] after home health found him to be hypotensive.  Work-up in the ED remarkable for WBC count 10.1, sodium 128, creatinine 1.21 with recent 1.02, troponin 118 followed by 112, urinalysis with moderate leukocytes, negative nitrite, 11-20 WBCs, with many bacteria.  Lactic acid 2.9 initially followed by 5.6.  CT abdomen/pelvis with bilateral medullary nephrocalcinosis with at least 1 punctate calculus in the midportion left kidney, new ureteral calculi or hydronephrosis, severe prostamegaly, diverticulosis without diverticulitis, small bilateral pleural effusions and associated ectasis versus consolidation, cardiomegaly and CAD. ? ?Patient was given 1 L IV fluid bolus followed by 150 mL/h.  Blood cultures x2 were drawn.  Patient was started on ceftriaxone 2 g IV for severe sepsis.  Per ED physician, patient appears well and responsive to fluid hydration at this time; and request admission for severe sepsis likely secondary to UTI. ? ?Plan of Care:  ?--Excepted to stepdown unit at [facility] (no progressive beds available at [facility]) ?--Discussed with ED physician that if patient does not continue to respond to IV fluid hydration with persistent hypotension, recommend initiation of vasopressors and PCCM consultation. ? ? ?[facility] will assume care on arrival to accepting facility. Until arrival, care as per EDP. However, [facility] available 24/7 for questions and assistance. ?  ?Please page [place] and Consults [phone]) as soon as the  patient arrives to the hospital.  ? ?[name] [place] DO ? ?

## 2021-08-18 NOTE — ED Provider Notes (Signed)
?[facility] EMERGENCY DEPT ?Provider Note ? ? ?CSN: [id number] ?Arrival date & time: 08/18/21  1040 ? ?  ? ?History ? ?Chief Complaint  ?Patient presents with  ? Hypotension  ? ? ?Adrian Olson is a 88 y.o. male. ? ?Pt is an 88 yo male with a pmhx significant for MVP, carotid artery disease, htn, hyperlipidemia, ED, MR s/p MV clip implantation with persistent mod-severe MR, and DM.  Pt has not been feeling well since yesterday.  His blood pressures at home were in the 60s and 70s.  He has some dysuria and is worried he has a uti.  He has not had a fever and has no pain.  Pt was admitted to the hospital from 3/27 to 4/2 for and admission for decompensated heart failure.  He saw his cardiologist (Dr. Tamala Julian) on 4/4 and they had an end of life discussion as pt's prognosis is not good.  Palliative care has been consulted, but they have not yet seen pt.  Dr. Tamala Julian told pt to alternate lasix 40 mg with 20 mg.  It looks like hypotension has been limiting treatment before today.  Pt has been having issues with fecal incontinence and that is bothering pt as well.  ?  ? ? ?  ? ?Home Medications ?Prior to Admission medications   ?Medication Sig Start Date End Date Taking? Authorizing Provider  ?albuterol (VENTOLIN HFA) 108 (90 Base) MCG/ACT inhaler Inhale 2 puffs into the lungs every 6 (six) hours as needed for wheezing or shortness of breath. 06/21/21  Yes Cobb, Karie Schwalbe, NP  ?aspirin 81 MG EC tablet Take 81 mg by mouth daily.   Yes [provider]  ?cetirizine (ZYRTEC ALLERGY) 10 MG tablet Take 1 tablet (10 mg total) by mouth daily. 06/21/21  Yes Clayton Bibles, NP  ?Cholecalciferol (VITAMIN D) 2000 UNITS tablet Take 2,000 Units by mouth daily at 12 noon.    Yes [provider]  ?finasteride (PROSCAR) 5 MG tablet Take 5 mg by mouth daily at 12 noon.    Yes [provider]  ?fluticasone (FLONASE) 50 MCG/ACT nasal spray Place 2 sprays into both nostrils daily. ?Patient taking  differently: Place 2 sprays into both nostrils daily as needed for allergies. 06/21/21  Yes Cobb, Karie Schwalbe, NP  ?furosemide (LASIX) 20 MG tablet Take one tablet by mouth every other day, alternating with '40mg'$  once daily every other day 08/16/21  Yes Belva Crome, MD  ?Glycopyrrolate-Formoterol (BEVESPI AEROSPHERE) 9-4.8 MCG/ACT AERO Inhale 2 puffs into the lungs 2 (two) times daily. 08/03/21  Yes Freddi Starr, MD  ?levothyroxine (SYNTHROID) 100 MCG tablet Take 1 tablet by mouth daily. 07/22/21  Yes [provider]  ?metFORMIN (GLUCOPHAGE) 500 MG tablet Take 500 mg by mouth every evening.  04/23/16  Yes [provider]  ?metoprolol tartrate (LOPRESSOR) 25 MG tablet Take 0.5 tablets (12.5 mg total) by mouth 2 (two) times daily. 08/14/21 12/12/21 Yes [name], MD  ?Roma Schanz test strip See admin instructions. 07/07/21  Yes [provider]  ?potassium chloride SA (KLOR-CON M) 20 MEQ tablet Take 0.5 tablets (10 mEq total) by mouth every Monday, Wednesday, and Friday. 08/01/21  Yes Belva Crome, MD  ?pravastatin (PRAVACHOL) 20 MG tablet Take 20 mg by mouth at bedtime.    Yes [provider]  ?ramipril (ALTACE) 5 MG capsule Take 5 mg by mouth daily.    Yes [provider]  ?sertraline (ZOLOFT) 50 MG tablet Take 50 mg  by mouth daily. 07/07/21  Yes [provider]  ?tamsulosin (FLOMAX) 0.4 MG CAPS capsule Take 0.4 mg by mouth daily. 04/13/18  Yes [provider]  ?Thiamine HCl (VITAMIN B1 PO) Take 125 mcg by mouth daily.    [provider]  ?   ? ?Allergies    ?Duloxetine hcl   ? ?Review of Systems   ?Review of Systems  ?Genitourinary:  Positive for difficulty urinating and dysuria.  ? ?Physical Exam ?Updated Vital Signs ?BP 94/74 (BP Location: Right Arm)   Pulse (!) 130   Temp 97.8 ?F (36.6 ?C) (Oral)   Resp (!) 24   Ht '5\' 9"'$  (1.753 m)   Wt 52.6 kg   SpO2 96%   BMI 17.13 kg/m?  ?Physical Exam ?Vitals and nursing note reviewed.   ?Constitutional:   ?   Appearance: Normal appearance. He is underweight. He is ill-appearing.  ?HENT:  ?   Head: Normocephalic and atraumatic.  ?   Right Ear: External ear normal.  ?   Left Ear: External ear normal.  ?   Nose: Nose normal.  ?   Mouth/Throat:  ?   Mouth: Mucous membranes are dry.  ?Eyes:  ?   Extraocular Movements: Extraocular movements intact.  ?   Conjunctiva/sclera: Conjunctivae normal.  ?   Pupils: Pupils are equal, round, and reactive to light.  ?Cardiovascular:  ?   Rate and Rhythm: Normal rate and regular rhythm.  ?   Pulses: Normal pulses.  ?   Heart sounds: Murmur heard.  ?Pulmonary:  ?   Effort: Pulmonary effort is normal.  ?   Breath sounds: Normal breath sounds.  ?Abdominal:  ?   General: Abdomen is flat. Bowel sounds are normal.  ?   Palpations: Abdomen is soft.  ?Musculoskeletal:     ?   General: Normal range of motion.  ?   Cervical back: Normal range of motion and neck supple.  ?Skin: ?   General: Skin is warm.  ?   Capillary Refill: Capillary refill takes less than 2 seconds.  ?Neurological:  ?   General: No focal deficit present.  ?   Mental Status: He is alert and oriented to person, place, and time.  ?Psychiatric:     ?   Mood and Affect: Mood normal.     ?   Behavior: Behavior normal.  ? ? ?ED Results / Procedures / Treatments   ?Labs ?(all labs ordered are listed, but only abnormal results are displayed) ?Labs Reviewed  ?BASIC METABOLIC PANEL - Abnormal; Notable for the following components:  ?    Result Value  ? Sodium 128 (*)   ? Chloride 92 (*)   ? Glucose, Bld 195 (*)   ? GFR, Estimated 58 (*)   ? All other components within normal limits  ?CBC - Abnormal; Notable for the following components:  ? RDW 16.9 (*)   ? All other components within normal limits  ?LACTIC ACID, PLASMA - Abnormal; Notable for the following components:  ? Lactic Acid, Venous 2.9 (*)   ? All other components within normal limits  ?LACTIC ACID, PLASMA - Abnormal; Notable for the following components:  ?  Lactic Acid, Venous 5.6 (*)   ? All other components within normal limits  ?URINALYSIS, ROUTINE W REFLEX MICROSCOPIC - Abnormal; Notable for the following components:  ? APPearance HAZY (*)   ? Hgb urine dipstick LARGE (*)   ? Protein, ur 30 (*)   ? Leukocytes,Ua MODERATE (*)   ?  RBC / HPF >50 (*)   ? Bacteria, UA MANY (*)   ? All other components within normal limits  ?TROPONIN I (HIGH SENSITIVITY) - Abnormal; Notable for the following components:  ? Troponin I (High Sensitivity) 118 (*)   ? All other components within normal limits  ?TROPONIN I (HIGH SENSITIVITY) - Abnormal; Notable for the following components:  ? Troponin I (High Sensitivity) 112 (*)   ? All other components within normal limits  ?RESP PANEL BY RT-PCR (FLU A&B, COVID) [id number]  ?CULTURE, BLOOD (ROUTINE X 2)  ?CULTURE, BLOOD (ROUTINE X 2)  ?GASTROINTESTINAL PANEL BY PCR, STOOL (REPLACES STOOL CULTURE)  ?C DIFFICILE QUICK SCREEN W PCR REFLEX    ?PROTIME-INR  ? ? ?EKG ?None ? ?Radiology ?CT ABDOMEN PELVIS WO CONTRAST ? ?Result Date: 08/18/2021 ?CLINICAL DATA:  Hematuria, diarrhea EXAM: CT ABDOMEN AND PELVIS WITHOUT CONTRAST TECHNIQUE: Multidetector CT imaging of the abdomen and pelvis was performed following the standard protocol without IV contrast. RADIATION DOSE REDUCTION: This exam was performed according to the departmental dose-optimization program which includes automated exposure control, adjustment of the mA and/or kV according to patient size and/or use of iterative reconstruction technique. COMPARISON:  06/11/2021 FINDINGS: Lower chest: Small bilateral pleural effusions and associated atelectasis or consolidation. Cardiomegaly. Coronary artery calcifications. Hepatobiliary: No solid liver abnormality is seen. No gallstones, gallbladder wall thickening, or biliary dilatation. Pancreas: Unremarkable. No pancreatic ductal dilatation or surrounding inflammatory changes. Spleen: Normal in size without significant abnormality. Adrenals/Urinary  Tract: Adrenal glands are unremarkable. Bilateral medullary nephrocalcinosis with at least one punctuate calculus in the midportion of the left kidney (series 5, image 32). Bladder is unremarkable. Stomach/Bowel: Stomach is wit

## 2021-08-18 NOTE — Progress Notes (Signed)
Elink is following sepsis bundle. 

## 2021-08-18 NOTE — H&P (Signed)
?History and Physical  ? ? ?PLEASE NOTE THAT [employer] SOFTWARE WAS USED IN THE CONSTRUCTION OF THIS NOTE. ? ? ?Adrian Olson IHK:[id number] DOB: 01/15/1931 DOA: 08/18/2021 ? ?PCP: Merrilee Seashore, MD  ?Patient coming from: home  ? ?I have personally briefly reviewed patient's old medical records in [place] ? ?Chief Complaint: Low blood pressure ? ?HPI: Adrian Olson is a 88 y.o. male with medical history significant for moderate to severe mitral regurgitation following MitraClip in May 7564, chronic diastolic heart failure, essential pretension, paroxysmal SVT, type 2 diabetes mellitus, BPH, depression, chronic hyponatremia who is admitted to [facility] on 08/18/2021 by way of transfer from [facility] emergency department with severe sepsis due to suspected urinary tract infection after presenting from home to the latter facility for evaluation of hypotension. ? ?The patient was recently hospitalized from 08/08/2021 to 08/14/2021 for acute on chronic diastolic heart failure in the setting of moderate to severe mitral regurgitation in spite of previous MitraClip in May 2021.  Upon discharge from the hospital, he underwent modifications to his outpatient Lasix regimen, such that he is now on alternating doses of Lasix 20 mg every other day versus Lasix 40 mg po every other day.  Per additional chart review, baseline blood pressures in the systolic 33I to low 951O mmHg.  He subsequently followed up with his outpatient cardiologist, Dr. Tamala Julian, on 08/16/2021, and per review of associated cardiology clinic note from that date, at which time blood pressure was noted to be 102/58, consistent with baseline.  At that time, Dr. Tamala Julian underwent CODE STATUS discussions with patient, who remains full code at this time.  Palliative care consult for assistance with goals of care clarification along with CODE STATUS clarification ordered, for this consultation has not yet occurred.  Per review of cardiology  clinic note from 08/16/2021, Dr. Tamala Julian conveys that the patient's persistent hypotension has limited treatment options as it relates to further diuresis efforts in the setting of chronic diastolic heart failure and moderate to severe mitral regurgitation. ? ?Over the last 2 days following his outpatient cardiology clinic follow-up on 08/16/2021, the patient reports that he has been experiencing generalized weakness associated new onset dysuria in the absence of gross hematuria or change in urinary urgency/frequency.  Notes that this dysuria feels subjectively similar to that which she experienced at times of previous urinary tract infections.  Denies any associated abdominal discomfort or flank pain.  Also denies subjective fever, chills, rigors, or generalized myalgias.  He reports 1-2 episodes of loose stool over the last 2 days, in the absence of any associated melena or hematochezia.  Denies any associated nausea/vomiting.  No associated any chest pain, shortness breath, diaphoresis, palpitations, dizziness, presyncope, or syncope.  No recent cough or wheezing.  No headache, neck stiffness, rash. ? ?Routine home health visit earlier today led to RN noting patient's blood pressure to be slightly lower than baseline, with systolic values in the 84Z relative to baseline values in the 90s to low 100s, prompting the patient to be brought to [facility] emergency department for further evaluation of this acute on chronic hypotension. ? ?Per chart review, most recent echocardiogram occurred on December 2022 was notable for LVEF 55 to 60%, indeterminate diastolic parameters, moderately dilated bilateral atria, normal right ventricular systolic function, moderate mitral gravitation, mild aortic regurgitation.  He also has a history of paroxysmal SVT. ? ?Per additional chart review, including review of prior labs, it appears that he has a history of chronic  hyponatremia, with serum sodium values dating back to November 2022 in  the range of 1 23-1 35.  In addition, baseline creatinine range appears to be 1.0-1.3. ? ? ? ? ?ED Course:  ?Vital signs in the ED were notable for the following: Afebrile; initial heart rate's in the range of 64-75, and associated with systolic blood pressures in the 90s to low 100s before going into tachyarrhythmia with ensuing heart rate range 1 10-1 31; while in this tachyarrhythmia, blood pressure decreased to 83/67; EDP discussed patient's case with on-call cardiologist from [facility], Dr. Percival Spanish, Who reviewed associated EKG, feeling that tachyarrhythmia was most representative atrial flutter versus SVT, without overt evidence of acute ischemic changes, and recommended IV amiodarone bolus.  Following receipt of IV amiodarone bolus, patient converted to sinus rhythm with ensuing improvement in blood pressure to 92/77; respiratory rate 18-27, oxygen saturation 97 to 100% on room air. ? ?Labs were notable for the following: BMP notable for the following: Sodium 128, which corrects to approximately 1.5, when taking into account mild concomitant hyperglycemia, potassium 4.4, bicarbonate 25, creatinine 1.21, glucose 195.  Serum magnesium level 1.9.  High-sensitivity troponin I initially 118, which trended down to 112 4 trending down further to 99.  This is relative to most recent prior high-sensitivity troponin I value of 402 on 08/08/2021.  CBC notable for white blood cell count 10,100, hemoglobin 13.4.  Initial lactate 2.9, with repeat value trending up to 5.6, before trending back down to 2.9.  INR 1.2.  Urinalysis was associated with a hazy appearing specimen and notable for 11-20 white blood cells, many bacteria, moderate leukocyte esterase, no squamous epithelial cells, was associated with the presence of hyaline casts.  Blood cultures x2 were collected prior to initiation of IV antibiotics.  COVID-19/influenza PCR negative. ? ?Imaging and additional notable ED work-up: Chest x-ray, in comparison to CXR from  08/08/2021 showed normal pulmonary vascularity, representing interval improvement, as well as improved aeration in the lung bases, while showing small residual pleural effusions without overt evidence of infiltrate, edema, or pneumothorax.  CT abdomen/pelvis without contrast showed no evidence of ureteral stones nor any evidence of hydronephrosis; the scan also demonstrated diverticulosis without evidence of diverticulitis, and showed small bilateral pleural effusions with bibasilar atelectasis versus infiltrate. ? ?While in the ED, the following were administered: Amiodarone 150 mg IV x1, Rocephin 2 g IV x1, lactated Ringer's x1.5 L bolus followed by continuous LR at 150 cc/h.  Magnesium sulfate 2 g IV over 2 hours x1 dose. ? ?Subsequently, the patient was transferred to [facility] for admission to stepdown unit for further evaluation management of suspected severe sepsis due to urinary tract infection with initial acute on chronic hypotension in the setting of initial tachyarrhythmia felt to represent atrial flutter versus SVT, with ensuing improvement in blood pressure back to systolic baselines in the 90s to low 100s following conversion to sinus rhythm after administration of IV amiodarone bolus, with additional work-up notable for hypomagnesemia as well as prolonged Qtc.  ? ? ? ?Review of Systems: As per HPI otherwise 10 point review of systems negative.  ? ?Past Medical History:  ?Diagnosis Date  ? Bilateral carotid artery disease ([place])   ? Severe Left and moderate Right  ? Diabetes mellitus without complication ([place])   ? ED (erectile dysfunction)   ? Hyperlipidemia   ? Hypertension   ? Lumbar disc disease   ? Mitral valve prolapse   ? moderate to severe MR, LVEF 55% Echo 2010  ?  Moderate mitral regurgitation 03/16/2014  ? S/P mitral valve clip implantation 10/09/2019  ? s/p TEER using 2 MitraClip XTW devices, first device positioned A2 P2, second device positioned A1 P1 by Dr. Burt Knack  ? ? ?Past Surgical  History:  ?Procedure Laterality Date  ? CARDIAC CATHETERIZATION    ? MITRAL VALVE REPAIR  10/09/2019  ? MITRAL VALVE REPAIR (N/A )  ? MITRAL VALVE REPAIR N/A 10/09/2019  ? Procedure: MITRAL VALVE REPAIR;  Renford Dills

## 2021-08-18 NOTE — Assessment & Plan Note (Signed)
? ?#(  Hypomagnesemia: Presenting symptom is not 1.6, notable, particular in setting of documented tachyarrhythmia at [facility] ED. status post 2 g IV magnesium sulfate administered at [facility] earlier today.  Additional relevance includes evidence of QTc prolongation on initial EKG. ? ?Plan: Monitor on telemetry.  Repeat serum magnesium level in the morning. ? ? ? ? ?

## 2021-08-18 NOTE — Assessment & Plan Note (Signed)
? ? ? ?#)   Generalized weakness:  1-2 day duration of generalized weakness, in the absence of any evidence of acute focal neurologic deficits, including no evidence of acute focal weakness.  Suspect contribution from physiologic stress stemming from presenting severe sepsis due to acute cystitis, as well as potential mild dehydration following interval up regulation of outpatient diuretic medication.  No e/o additional infectious process at this time, although adding on procalcitonin to further assess bibasilar atelectasis versus infiltrate, as further detailed above. In terms of other considered etiologies, acute ischemic CVA is felt to be less likely at this time in absence of any associated acute focal neuro deficits. Will further eval for any additional contributions from endocrine/metabolic sources, as detailed below.  ? ? ?Plan: work-up and management of presenting severe sepsis due to acute cystitis, as described above. PT consult ordered for the AM.  CMP/CBC in the AM. Check TSH, MMA. Check procalcitonin. ? ? ? ? ?

## 2021-08-18 NOTE — Assessment & Plan Note (Signed)
? ? ?#)   Benign Prostatic Hyperplasia:  documented h/o such; on tamsulosin as outpatient, as well as finasteride.  in setting of presenting sepsis, will hold tamsulosin for now due to the potential for peripheral vasodilatory consequences and increased risk for hypotension.  ? ? ?Plan: monitor strict I's & O's and daily weights. Repeat BMP in AM.  Continue home finasteride, but hold home tamsulosin for now, as above. ? ? ?

## 2021-08-18 NOTE — ED Provider Notes (Addendum)
Awaiting callback for admission. ?Patient has had significant problems with CHF due to mitral regurgitation and frequent hospitalization with poor renal function.  He is presenting today with hypotension and malaise. ?Physical Exam  ?BP 94/74 (BP Location: Right Arm)   Pulse (!) 130   Temp 97.8 ?F (36.6 ?C) (Oral)   Resp (!) 24   Ht '5\' 9"'$  (1.753 m)   Wt 52.6 kg   SpO2 96%   BMI 17.13 kg/m?  ? ?Physical Exam ? ?Procedures  ?Procedures ? ?ED Course / MDM  ?  ?Medical Decision Making ?Amount and/or Complexity of Data Reviewed ?Labs: ordered. ?Radiology: ordered. ? ?Risk ?Prescription drug management. ?Decision regarding hospitalization. ? ? ?Dr.[place] returned call for admission.  ? ?Multiple reassessments.  Clinically patient continues to look well.  He is not exhibiting subjective shortness of breath.  He has no peripheral edema.  Lungs remain clear to auscultation.  Heart rate however is continuing to be steadily 130 with little variation.  Occasional PVC.  Suggestive of SVT versus flutter.  Patient has documented history of paroxysmal SVT. ? ?After completing hydration to 2 L, patient remains with same heart rate and blood pressures soft high 80s to 90s.  Clinically he is tolerating this well.  However I suspect he will develop demand ischemia and the elevated lactic on repeat suggests hypoperfusion which I suspect is much to be cardiogenic as actual sepsis. ? ?Vagal maneuvers attempted.  I had patient blow and syringe barrel and do supine like flexion and extension.  Very brief slow and heart rate without obvious significant pauses or clear flutter waves to be observed.  Rate was immediately back to 130.  Patient tolerated these well.  He continues to have excellent mentation and no objective appearance of dyspnea with oxygen saturation at 100%. ? ?Consult: 17: 15 Dr. Percival Spanish.  We reviewed the patient's EKG and suspected atrial flutter versus SVT.  Clinically patient is well in appearance.  He is not  having sepsis appearance.  He has been fully rehydrated for sepsis protocol.  He is not exhibiting shortness of breath.  Dr. Percival Spanish okay with IV amiodarone bolus for rate control. ? ?After completing amiodarone bolus, heart rate is 88 sinus rhythm with intermittent PVCs.  QTc 508.  Will add lab values for magnesium, phosphorus and repeat troponin and lactic.  Will add 2 g magnesium infusion. ? ?18: 14 time of transport patient is feeling well.  He has no complaints.  He does not have any subjective shortness of breath.  Last systolic blood pressure is 99.  He is alert and cheerful. ? ?CRITICAL CARE ?Performed by: Charlesetta Shanks ? ? ?Total critical care time: 45 minutes ? ?Critical care time was exclusive of separately billable procedures and treating other patients. ? ?Critical care was necessary to treat or prevent imminent or life-threatening deterioration. ? ?Critical care was time spent personally by me on the following activities: development of treatment plan with patient and/or surrogate as well as nursing, discussions with consultants, evaluation of patient's response to treatment, examination of patient, obtaining history from patient or surrogate, ordering and performing treatments and interventions, ordering and review of laboratory studies, ordering and review of radiographic studies, pulse oximetry and re-evaluation of patient's condition.  ? ? ?  ?Charlesetta Shanks, MD ?08/18/21 1719 ? ?  ?Charlesetta Shanks, MD ?08/18/21 1758 ? ?  ?Charlesetta Shanks, MD ?08/18/21 1815 ? ?

## 2021-08-18 NOTE — ED Triage Notes (Signed)
Pt via pov from home with hypotension since yesterday and possible uti; pt reports urinary retention and urgency x 1 week. Pt was seen by home health care yesterday and had hypotension (75/52, 85/52, 68/49). First bp in triage 56/40. ? ?Pt alert & oriented, answering questions appropriately.  ?

## 2021-08-18 NOTE — Assessment & Plan Note (Signed)
? ?#)   Hyperlipidemia: documented h/o such. On pravastatin as outpatient.    Plan: continue home statin.     

## 2021-08-19 DIAGNOSIS — Z515 Encounter for palliative care: Secondary | ICD-10-CM | POA: Diagnosis not present

## 2021-08-19 DIAGNOSIS — I5032 Chronic diastolic (congestive) heart failure: Secondary | ICD-10-CM | POA: Diagnosis not present

## 2021-08-19 DIAGNOSIS — N3000 Acute cystitis without hematuria: Secondary | ICD-10-CM | POA: Diagnosis not present

## 2021-08-19 DIAGNOSIS — A419 Sepsis, unspecified organism: Secondary | ICD-10-CM | POA: Diagnosis not present

## 2021-08-19 DIAGNOSIS — Z7189 Other specified counseling: Secondary | ICD-10-CM

## 2021-08-19 DIAGNOSIS — N400 Enlarged prostate without lower urinary tract symptoms: Secondary | ICD-10-CM | POA: Diagnosis not present

## 2021-08-19 LAB — CBC WITH DIFFERENTIAL/PLATELET
Abs Immature Granulocytes: 0.04 10*3/uL (ref 0.00–0.07)
Basophils Absolute: 0 10*3/uL (ref 0.0–0.1)
Basophils Relative: 0 %
Eosinophils Absolute: 0.1 10*3/uL (ref 0.0–0.5)
Eosinophils Relative: 1 %
HCT: 34 % — ABNORMAL LOW (ref 39.0–52.0)
Hemoglobin: 11.5 g/dL — ABNORMAL LOW (ref 13.0–17.0)
Immature Granulocytes: 1 %
Lymphocytes Relative: 26 %
Lymphs Abs: 2.1 10*3/uL (ref 0.7–4.0)
MCH: 30.8 pg (ref 26.0–34.0)
MCHC: 33.8 g/dL (ref 30.0–36.0)
MCV: 91.2 fL (ref 80.0–100.0)
Monocytes Absolute: 0.7 10*3/uL (ref 0.1–1.0)
Monocytes Relative: 8 %
Neutro Abs: 5.3 10*3/uL (ref 1.7–7.7)
Neutrophils Relative %: 64 %
Platelets: 195 10*3/uL (ref 150–400)
RBC: 3.73 MIL/uL — ABNORMAL LOW (ref 4.22–5.81)
RDW: 16.7 % — ABNORMAL HIGH (ref 11.5–15.5)
WBC: 8.3 10*3/uL (ref 4.0–10.5)
nRBC: 0 % (ref 0.0–0.2)

## 2021-08-19 LAB — COMPREHENSIVE METABOLIC PANEL
ALT: 56 U/L — ABNORMAL HIGH (ref 0–44)
AST: 74 U/L — ABNORMAL HIGH (ref 15–41)
Albumin: 2.6 g/dL — ABNORMAL LOW (ref 3.5–5.0)
Alkaline Phosphatase: 72 U/L (ref 38–126)
Anion gap: 11 (ref 5–15)
BUN: 21 mg/dL (ref 8–23)
CO2: 20 mmol/L — ABNORMAL LOW (ref 22–32)
Calcium: 8.2 mg/dL — ABNORMAL LOW (ref 8.9–10.3)
Chloride: 99 mmol/L (ref 98–111)
Creatinine, Ser: 1.02 mg/dL (ref 0.61–1.24)
GFR, Estimated: >60 mL/min (ref >60)
Glucose, Bld: 152 mg/dL — ABNORMAL HIGH (ref 70–99)
Potassium: 4.3 mmol/L (ref 3.5–5.1)
Sodium: 130 mmol/L — ABNORMAL LOW (ref 135–145)
Total Bilirubin: 1 mg/dL (ref 0.3–1.2)
Total Protein: 5.7 g/dL — ABNORMAL LOW (ref 6.5–8.1)

## 2021-08-19 LAB — GLUCOSE, CAPILLARY
Glucose-Capillary: 172 mg/dL — ABNORMAL HIGH (ref 70–99)
Glucose-Capillary: 137 mg/dL — ABNORMAL HIGH (ref 70–99)
Glucose-Capillary: 184 mg/dL — ABNORMAL HIGH (ref 70–99)
Glucose-Capillary: 153 mg/dL — ABNORMAL HIGH (ref 70–99)

## 2021-08-19 LAB — LACTIC ACID, PLASMA: Lactic Acid, Venous: 1.5 mmol/L (ref 0.5–1.9)

## 2021-08-19 LAB — MAGNESIUM: Magnesium: 2.1 mg/dL (ref 1.7–2.4)

## 2021-08-19 MED ORDER — ADULT MULTIVITAMIN W/MINERALS CH
1.0000 | ORAL_TABLET | Freq: Every day | ORAL | Status: DC
  Administered 2021-08-19 – 2021-08-22 (×4): 1 via ORAL
  Filled 2021-08-19 (×4): qty 1

## 2021-08-19 MED ORDER — TAMSULOSIN HCL 0.4 MG PO CAPS
0.4000 mg | ORAL_CAPSULE | Freq: Every day | ORAL | Status: DC
  Administered 2021-08-19: 0.4 mg via ORAL
  Filled 2021-08-19: qty 1

## 2021-08-19 MED ORDER — AMIODARONE IV BOLUS ONLY 150 MG/100ML
150.0000 mg | Freq: Once | INTRAVENOUS | Status: AC
  Administered 2021-08-19: 150 mg via INTRAVENOUS
  Filled 2021-08-19: qty 100

## 2021-08-19 NOTE — Progress Notes (Signed)
Patient converted to Afib with RVR at 520am with BP in 110s. Adrian Olson notified and received orders for Amio bolus x1 with resolution of RVR. EKG on chart ?

## 2021-08-19 NOTE — Consult Note (Signed)
?Cardiology Consultation:  ? ?Patient ID: Adrian Olson ?MRN: [id number]; DOB: 1930-06-20 ? ?Admit date: 08/18/2021 ?Date of Consult: 08/19/2021 ? ?PCP:  Merrilee Seashore, MD ?  ?[facility] ?Cardiologist:  [name], MD    ? ? ?Patient Profile:  ? ?Adrian Olson is a 88 y.o. male with a hx of severe mitral regurgitation s/p mitraclip 5885, chronic diastolic heart failure, essention hypertension, paroxysmal SVT, type 2 DM, BPH, depression, chronic hyponatremia who is being seen 08/19/2021 for the evaluation of afib with RVR, CHF at the request of Dr. Starla Link. ? ?History of Present Illness:  ? ?Mr. Adrian Olson is an 88 year old male with above medical history who is followed by Dr. Tamala Julian. Per chart review, patient had a cardiac catheterization on 09/16/2019 for evaluation prior to mitral valve repair that showed moderate LAD coronary disease. Recommended medical management. Patient underwent mitral valve repair with  MitraClip on 10/09/2019. Patient did well until 04/2021 when he had a syncopal episode/fall resulting in hospitalization. Patient was treated for a traumatic hemothorax that required right thoracentesis. Since then, patient has been hospitalized multiple times for CHF exacerbations. Patient was admitted in 12/22 for CHF exacerbation and echocardiogram showed LVEF 55-60%, mild LVH, moderate biatrial enlargement, moderate MR, mild AI. Monitor worn in 12/22 after that admission showed predominantly sinus rhythm and 60 episodes of SVT that were self-terminating. Longest episodes of SVT lasted for 18 seconds.  ? ?Patient was again admitted in 2/23 for treatment of acute on chronic diastolic heart failure. Patient was treated with IV lasix and transitioned to PO lasix for discharge. Patient was seen in [facility] CHF clinic for follow up on 07/05/21 and reported weak loss and weakness. Patient was instructed to hold his daily lasix but to rather use it PRN for weight gain. Patient was later seen by Dr.  Tamala Julian on 08/01/21 and reported some improvement. However, he did not some dyspnea on exertion and lower extremity edema. Advised to restart lasix 40 mg MWF.  ? ?Patient was recently admitted to the hospital from 08/09/21-08/14/21 for treatment of acute on chronic diastolic CHF, elevated troponin, mitral regurgitation. Patient was discharged on lasix 40 mg daily.  ? ?Patient was last seen by cardiology on 08/16/21. At that visit, patient reported that he continued to be SOB, coughing, and feeling terrible. Dr. Tamala Julian noted that he has had 3 hospitalization since December for decompensated heart failure with pleural effusions, weakness, and dyspnea. Patient had poor appetite, progressive loss of muscle mass. They had a conversation concerning end-of-life. Dr Tamala Julian explained that we did not have further solid medical management options, and his symptoms were due to mitral regurgitation related to degenerative mitral valve disease. He is not a surgical candidate, and he had a MitraClip that did not help. There were plans for an urgent palliative care consult outpatient, however patient presented to the hospital before it could be completed.  ? ?Patient presented to the ED on 4/6 complaining of hypotension for the past 2 days. Initial EKG in the ED showed multiple PVCs, prolonged QTC (QT/QTC 404/474). Labs in the ED showed Na 128, K 4.4, creatinine 1.21, hemoglobin 13.4, WBC 10.1, platelets 239. Lactic acid 2.9>>5.6. hsTn 118>>112>>99. He developed sustained tachycardia- rhythm appears to be atrial flutter. Given IV bolus of amiodarone and converted to NSR with frequent PVCs. Had brief episode of AFL this am 5:16 but again converted to NSR. Patient is completely unaware of arrhythmia. Reports feeling well at rest but gets SOB with  any activity which is his baseline. He is now a DNR.  ? ? ?Past Medical History:  ?Diagnosis Date  ? Bilateral carotid artery disease ([place])   ? Severe Left and moderate Right  ? Diabetes mellitus  without complication ([place])   ? ED (erectile dysfunction)   ? Hyperlipidemia   ? Hypertension   ? Lumbar disc disease   ? Mitral valve prolapse   ? moderate to severe MR, LVEF 55% Echo 2010  ? Moderate mitral regurgitation 03/16/2014  ? S/P mitral valve clip implantation 10/09/2019  ? s/p TEER using 2 MitraClip XTW devices, first device positioned A2 P2, second device positioned A1 P1 by Dr. Burt Knack  ? ? ?Past Surgical History:  ?Procedure Laterality Date  ? CARDIAC CATHETERIZATION    ? MITRAL VALVE REPAIR  10/09/2019  ? MITRAL VALVE REPAIR (N/A )  ? MITRAL VALVE REPAIR N/A 10/09/2019  ? Procedure: MITRAL VALVE REPAIR;  Surgeon: Sherren Mocha, MD;  Location: [place] [facility];  Service: Cardiovascular;  Laterality: N/A;  ? RIGHT/LEFT HEART CATH AND CORONARY ANGIOGRAPHY N/A 09/16/2019  ? Procedure: RIGHT/LEFT HEART CATH AND CORONARY ANGIOGRAPHY;  Surgeon: Belva Crome, MD;  Location: [place] [facility];  Service: Cardiovascular;  Laterality: N/A;  ? TEE WITHOUT CARDIOVERSION N/A 09/16/2019  ? Procedure: TRANSESOPHAGEAL ECHOCARDIOGRAM (TEE);  Surgeon: Lelon Perla, MD;  Location: [facility] ENDOSCOPY;  Service: Cardiovascular;  Laterality: N/A;  ?  ? ?Home Medications:  ?Prior to Admission medications   ?Medication Sig Start Date End Date Taking? Authorizing Provider  ?albuterol (VENTOLIN HFA) 108 (90 Base) MCG/ACT inhaler Inhale 2 puffs into the lungs every 6 (six) hours as needed for wheezing or shortness of breath. 06/21/21  Yes Cobb, Karie Schwalbe, NP  ?aspirin 81 MG EC tablet Take 81 mg by mouth daily.   Yes [provider]  ?cetirizine (ZYRTEC ALLERGY) 10 MG tablet Take 1 tablet (10 mg total) by mouth daily. 06/21/21  Yes Clayton Bibles, NP  ?Cholecalciferol (VITAMIN D) 2000 UNITS tablet Take 2,000 Units by mouth daily at 12 noon.    Yes [provider]  ?finasteride (PROSCAR) 5 MG tablet Take 5 mg by mouth daily at 12 noon.    Yes [provider]  ?fluticasone (FLONASE) 50 MCG/ACT nasal spray  Place 2 sprays into both nostrils daily. ?Patient taking differently: Place 2 sprays into both nostrils daily as needed for allergies. 06/21/21  Yes Cobb, Karie Schwalbe, NP  ?furosemide (LASIX) 20 MG tablet Take one tablet by mouth every other day, alternating with '40mg'$  once daily every other day 08/16/21  Yes Belva Crome, MD  ?Glycopyrrolate-Formoterol (BEVESPI AEROSPHERE) 9-4.8 MCG/ACT AERO Inhale 2 puffs into the lungs 2 (two) times daily. 08/03/21  Yes Freddi Starr, MD  ?levothyroxine (SYNTHROID) 100 MCG tablet Take 1 tablet by mouth daily. 07/22/21  Yes [provider]  ?metFORMIN (GLUCOPHAGE) 500 MG tablet Take 500 mg by mouth every evening.  04/23/16  Yes [provider]  ?metoprolol tartrate (LOPRESSOR) 25 MG tablet Take 0.5 tablets (12.5 mg total) by mouth 2 (two) times daily. 08/14/21 12/12/21 Yes [name], MD  ?Roma Schanz test strip See admin instructions. 07/07/21  Yes [provider]  ?potassium chloride SA (KLOR-CON M) 20 MEQ tablet Take 0.5 tablets (10 mEq total) by mouth every Monday, Wednesday, and Friday. 08/01/21  Yes Belva Crome, MD  ?pravastatin (PRAVACHOL) 20 MG tablet Take 20 mg by mouth at bedtime.    Yes [provider]  ?  ramipril (ALTACE) 5 MG capsule Take 5 mg by mouth daily.    Yes [provider]  ?sertraline (ZOLOFT) 50 MG tablet Take 50 mg by mouth daily. 07/07/21  Yes [provider]  ?tamsulosin (FLOMAX) 0.4 MG CAPS capsule Take 0.4 mg by mouth daily. 04/13/18  Yes [provider]  ?Thiamine HCl (VITAMIN B1 PO) Take 125 mcg by mouth daily.    [provider]  ? ? ?Inpatient Medications: ?Scheduled Meds: ? aspirin EC  81 mg Oral Daily  ? Chlorhexidine Gluconate Cloth  6 each Topical Daily  ? finasteride  5 mg Oral Daily  ? insulin aspart  0-6 Units Subcutaneous TID WC  ? levothyroxine  100 mcg Oral Q0600  ? mouth rinse  15 mL Mouth Rinse BID  ? metoprolol tartrate  12.5 mg Oral BID  ? pravastatin  20 mg  Oral QHS  ? tamsulosin  0.4 mg Oral QPC supper  ? ?Continuous Infusions: ? cefTRIAXone (ROCEPHIN)  IV Stopped (08/18/21 1214)  ? ?PRN Meds: ?acetaminophen **OR** acetaminophen, melatonin ? ?Allergies:    ?Allerg

## 2021-08-19 NOTE — Progress Notes (Signed)
Initial Nutrition Assessment ? ?DOCUMENTATION CODES:  ? ?Severe malnutrition in context of chronic illness ? ?INTERVENTION:  ?- will order 1 tablet multivitamin with minerals daily. ?- order oral nutrition supplement if patient does not d/c over the weekend. ?- liberalize from Heart Healthy/Carb Modified to 2 gram Na due to severe malnutrition and poor oral intake. ? ? ?NUTRITION DIAGNOSIS:  ? ?Severe Malnutrition related to chronic illness (CHF) as evidenced by severe fat depletion, severe muscle depletion. ? ?GOAL:  ? ?Patient will meet greater than or equal to 90% of their needs ? ?MONITOR:  ? ?PO intake, Labs, Weight trends, Skin ? ?REASON FOR ASSESSMENT:  ? ?Malnutrition Screening Tool ? ?ASSESSMENT:  ? ?88 y.o. male with medical history of moderate to severe mitral regurgitation following MitraClip in May 2021, CHF, HTN, paroxysmal SVT, type 2 DM, BPH, depression, chronic hyponatremia, recent hospitalization from 08/08/2021-08/14/2021 for acute on chronic CHF requiring IV Lasix and subsequent oral Lasix. He was admitted for severe sepsis. ? ?Patient seen by this RD on 3/30. Patient laying in bed working on lunch (had eaten ~50% of the meal) and his daughter was at bedside.  ? ?During the week he was out of the hospital appetite was poor overall, often eating a small portion even of favorite foods. He consumes a low carb, high protein oral nutrition shake at home but often will only drink 1/2 bottle at a time d/t feeling too full. Patient would like to hold off on receiving supplement to see how long he is hospitalized.  ? ?At home he utilizes a walker to get around. ? ?Weight today is 129 lb and weight has been fairly stable over the past 2 months.   ? ? ? ?Labs reviewed; CBGs: 172 and 137 mg/dl, Na: 130 mmol/l, Ca: 8.2 mg/dl. ?Medications reviewed; sliding scale novolog, 100 mcg oral synthroid/day, 2 g IV Mg sulfate x1 run 4/6. ? ?  ? ?NUTRITION - FOCUSED PHYSICAL EXAM: ? ?Flowsheet Row Most Recent Value   ?Orbital Region Moderate depletion  ?Upper Arm Region Severe depletion  ?Thoracic and Lumbar Region Severe depletion  ?Buccal Region Severe depletion  ?Temple Region Moderate depletion  ?Clavicle Bone Region Severe depletion  ?Clavicle and Acromion Bone Region Severe depletion  ?Scapular Bone Region Severe depletion  ?Dorsal Hand Moderate depletion  ?Patellar Region Moderate depletion  ?Anterior Thigh Region Moderate depletion  ?Posterior Calf Region Moderate depletion  ?Edema (RD Assessment) None  ?Hair Reviewed  ?Eyes Reviewed  ?Mouth Reviewed  ?Skin Reviewed  ?Nails Reviewed  ? ?  ? ? ?Diet Order:   ?Diet Order   ? ?       ?  Diet heart healthy/carb modified Room service appropriate? Yes; Fluid consistency: Thin; Fluid restriction: 1500 mL Fluid  Diet effective now       ?  ? ?  ?  ? ?  ? ? ?EDUCATION NEEDS:  ? ?Education needs have been addressed ? ?Skin:  Skin Assessment: Skin Integrity Issues: ?Skin Integrity Issues:: Other (Comment) ?Other: open sore to buttocks ? ?Last BM:  PTA/unknown ? ?Height:  ? ?Ht Readings from Last 1 Encounters:  ?08/18/21 '5\' 9"'$  (1.753 m)  ? ? ?Weight:  ? ?Wt Readings from Last 1 Encounters:  ?08/19/21 58.7 kg  ? ? ? ?BMI:  Body mass index is 19.11 kg/m?. ? ?Estimated Nutritional Needs:  ?Kcal:  1800-2000 kcal ?Protein:  90-105 grams ?Fluid:  >/= 2 L/day ? ? ? ? ?Adrian Matin, MS, RD, LDN ?Registered Dietitian II ?Inpatient  Clinical Nutrition ?RD pager # and on-call/weekend pager # available in [place]  ? ?

## 2021-08-19 NOTE — Evaluation (Signed)
Physical Therapy Evaluation ?Patient Details ?Name: Adrian Olson ?MRN: [id number] ?DOB: [birth year] ?Today's Date: 08/19/2021 ? ?History of Present Illness ? Pt is 88 yo male admitted with hyotension and sepsis 2* UTI. Pt with hx including mitral regurgitation despite clipping, DM, HTN, lumbar disc disease  ?Clinical Impression ? Pt admitted as above and presenting with functional mobility limitations 2* generalized weakness, ambulatory balance deficits and reduced endurance.  Pt should progress to dc home with family assist and would benefit from continued follow up HHPT ?   ? ?Recommendations for follow up therapy are one component of a multi-disciplinary discharge planning process, led by the attending physician.  Recommendations may be updated based on patient status, additional functional criteria and insurance authorization. ? ?Follow Up Recommendations Home health PT ? ?  ?Assistance Recommended at Discharge Intermittent Supervision/Assistance  ?Patient can return home with the following ? A little help with walking and/or transfers;A little help with bathing/dressing/bathroom;Assistance with cooking/housework;Help with stairs or ramp for entrance ? ?  ?Equipment Recommendations None recommended by PT  ?Recommendations for Other Services ?    ?  ?Functional Status Assessment Patient has had a recent decline in their functional status and demonstrates the ability to make significant improvements in function in a reasonable and predictable amount of time.  ? ?  ?Precautions / Restrictions Precautions ?Precautions: Fall ?Restrictions ?Weight Bearing Restrictions: No  ? ?  ? ?Mobility ? Bed Mobility ?Overal bed mobility: Needs Assistance ?Bed Mobility: Supine to Sit ?  ?  ?Supine to sit: Supervision ?  ?  ?General bed mobility comments: increased time and effort to sit ?  ? ?Transfers ?Overall transfer level: Needs assistance ?Equipment used: Rolling walker (2 wheels) ?Transfers: Sit to/from Stand ?Sit to Stand:  Min guard ?  ?  ?  ?  ?  ?General transfer comment: min guard for safety; cues to push with hands ?  ? ?Ambulation/Gait ?Ambulation/Gait assistance: Min guard ?Gait Distance (Feet): 24 Feet ?Assistive device: Rolling walker (2 wheels) ?Gait Pattern/deviations: Step-through pattern, Decreased stride length ?Gait velocity: decreased ?  ?  ?General Gait Details: min guard for safety and cues for RW proximity; pt ambulates with kyphotic posture with mildly flexed knees; distance limited by IV leaking ? ?Stairs ?  ?  ?  ?  ?  ? ?Wheelchair Mobility ?  ? ?Modified Rankin (Stroke Patients Only) ?  ? ?  ? ?Balance Overall balance assessment: Needs assistance ?Sitting-balance support: No upper extremity supported ?Sitting balance-Leahy Scale: Good ?  ?  ?Standing balance support: Bilateral upper extremity supported, Reliant on assistive device for balance ?Standing balance-Leahy Scale: Poor ?Standing balance comment: Uses RWbut no LOB ?  ?  ?  ?  ?  ?  ?  ?  ?  ?  ?  ?   ? ? ? ?Pertinent Vitals/Pain Pain Assessment ?Pain Assessment: No/denies pain  ? ? ?Home Living Family/patient expects to be discharged to:: Private residence ?Living Arrangements: Spouse/significant other ?Available Help at Discharge: Family;Available 24 hours/day ?Type of Home: House ?Home Access: Stairs to enter ?  ?Entrance Stairs-Number of Steps: 1 ?  ?Home Layout: One level ?Home Equipment: BSC/3in1;Rollator (4 wheels);Hand held shower head ?   ?  ?Prior Function Prior Level of Function : Needs assist;History of Falls (last six months) ?  ?  ?  ?  ?  ?  ?Mobility Comments: Reports use of rollator and supervision for gait. currently working with [place].  Pt ambulating short community distances.  Prior to  fall in November, pt was exercising, ambulating 2-3 miles/daily ?ADLs Comments: increased time with ADLs, wife typically assists with LB dressing and bathing but she is currently not in good health so uncertain if she can continue to provide same level of  assist. ?  ? ? ?Hand Dominance  ? Dominant Hand: Right ? ?  ?Extremity/Trunk Assessment  ? Upper Extremity Assessment ?Upper Extremity Assessment: Generalized weakness ?  ? ?Lower Extremity Assessment ?Lower Extremity Assessment: Generalized weakness ?  ? ?Cervical / Trunk Assessment ?Cervical / Trunk Assessment: Kyphotic;Other exceptions ?Cervical / Trunk Exceptions: Pt tends to stand with knees slightly flexed, hips extended, and kyphotic  ?Communication  ? Communication: HOH  ?Cognition Arousal/Alertness: Awake/alert ?Behavior During Therapy: [facility] for tasks assessed/performed ?Overall Cognitive Status: Within Functional Limits for tasks assessed ?  ?  ?  ?  ?  ?  ?  ?  ?  ?  ?  ?  ?  ?  ?  ?  ?  ?  ?  ? ?  ?General Comments   ? ?  ?Exercises    ? ?Assessment/Plan  ?  ?PT Assessment Patient needs continued PT services  ?PT Problem List Decreased strength;Decreased mobility;Decreased range of motion;Decreased activity tolerance;Decreased balance;Cardiopulmonary status limiting activity ? ?   ?  ?PT Treatment Interventions DME instruction;Therapeutic activities;Gait training;Therapeutic exercise;Patient/family education;Stair training;Functional mobility training;Balance training   ? ?PT Goals (Current goals can be found in the Care Plan section)  ?Acute Rehab PT Goals ?Patient Stated Goal: return home ?PT Goal Formulation: With patient ?Time For Goal Achievement: 08/24/21 ?Potential to Achieve Goals: Good ? ?  ?Frequency Min 3X/week ?  ? ? ?Co-evaluation   ?  ?  ?  ?  ? ? ?  ?AM-PAC PT "6 Clicks" Mobility  ?Outcome Measure Help needed turning from your back to your side while in a flat bed without using bedrails?: A Little ?Help needed moving from lying on your back to sitting on the side of a flat bed without using bedrails?: A Little ?Help needed moving to and from a bed to a chair (including a wheelchair)?: A Little ?Help needed standing up from a chair using your arms (e.g., wheelchair or bedside chair)?: A  Little ?Help needed to walk in hospital room?: Total ?Help needed climbing 3-5 steps with a railing? : Total ?6 Click Score: 14 ? ?  ?End of Session Equipment Utilized During Treatment: Gait belt ?Activity Tolerance: Patient tolerated treatment well ?Patient left: in chair;with call bell/phone within reach;with chair alarm set ?Nurse Communication: Mobility status ?PT Visit Diagnosis: Other abnormalities of gait and mobility (R26.89);Muscle weakness (generalized) (M62.81) ?  ? ?Time: 9628-3662 ?PT Time Calculation (min) (ACUTE ONLY): 27 min ? ? ?Charges:   PT Evaluation ?$PT Eval Low Complexity: 1 Low ?  ?  ?   ? ? ?Debe Coder PT ?Acute Rehabilitation Services ?Pager [phone] ?Office [phone] ? ? ?Aryiana Klinkner ?08/19/2021, 11:49 AM ? ?

## 2021-08-19 NOTE — Progress Notes (Addendum)
?PROGRESS NOTE ? ? ? ?Adrian Olson  VXB:[id number] DOB: 02/21/1931 DOA: 08/18/2021 ?PCP: Merrilee Seashore, MD  ? ?Brief Narrative:  ? 88 y.o. male with medical history significant for moderate to severe mitral regurgitation following MitraClip in May 3300, chronic diastolic heart failure, essential pretension, paroxysmal SVT, type 2 diabetes mellitus, BPH, depression, chronic hyponatremia, recent hospitalization from 08/08/2021-08/14/2021 for acute on chronic diastolic heart failure requiring IV Lasix and subsequent oral Lasix as per cardiology recommendations presented with dysuria and low blood pressure.  During outpatient follow-up with Dr. Smith/cardiology on 08/16/2021, he had recommended palliative care evaluation for consideration of possible hospice because of severely limited treatment options given patient's persistent hypotension.  On presentation, patient was intermittently tachycardic with concern for atrial flutter versus SVT.  This was treated with IV amiodarone with subsequent improvement in blood pressure.  Sodium was 128, high-sensitivity troponin was 118 and 112 and 99.  Initial lactate was 2.9, subsequent lactate was 5.6.  UA was suggestive for UTI.  COVID-19/influenza testing were negative.  He was started on IV fluids and antibiotics.  Chest x-ray showed small residual pleural effusions without overt infiltrate, edema or pneumothorax.  CT abdomen/pelvis without contrast showed no ureteral stones nor any evidence of hydronephrosis, ultrasound diverticulosis without diverticulitis and small bilateral pleural effusions with basilar atelectasis versus infiltrate. ? ?Assessment & Plan: ?  ?Severe sepsis: Present on admission ?UTI: Present on admission ?Lactic acidosis ?-Presented with tachycardia, hypotension, lactic acidosis, dysuria with evidence of UTI.  Treated with IV fluids.  Lactic acid level has normalized ?-Continue IV fluids.  Follow cultures.  COVID-19/influenza testing  negative. ?-Procalcitonin less than 0.1.  Doubt that patient has pneumonia. ?-Off IV fluids given history of recent hospitalization for CHF exacerbation ? ?Chronic diastolic heart failure ?Acute on chronic hypotension ?History of hypertension ?-Patient was recently hospitalized for acute on chronic diastolic heart failure and discharged on Lasix 40 mg on 1 day and 20 mg on the following day as per cardiology recommendations.  Lasix on hold because of worsening hypotension on presentation. ?-Blood pressure improving, intermittently still on the lower side ?-Strict input and output.  Daily weights.  Fluid restriction.  Continue metoprolol ?-Continue telemetry monitoring ?-Cardiology evaluation.  As per recent cardiology evaluation: Dr. Tamala Julian had recommended palliative care consultation because of severely limited treatment options given patient's persistent hypotension ?-I have spoken to the patient at bedside regarding CODE STATUS and he is agreeable for DNR. ?-Palliative care consultation is pending ? ?Status post MitraClip ?-Has moderate mitral regurgitation on most recent echocardiogram.  Patient is not a candidate for another procedure apparently. ? ?History of PSVT ?-Continue Lopressor.  Patient was tachycardic on presentation and had to be given IV amiodarone for concern for PSVT versus atrial flutter as per cardiology recommendations. ?-Cardiology to see the patient today. ? ?Hyponatremia, chronic ?-Improving.  Sodium 130 today.  Monitor. ? ?Hyperlipidemia--continue statin ? ?Hypothyroidism--continue levothyroxine ? ?BPH--continue finasteride.  Flomax on hold because of hypotension.  Might have to resume Flomax if blood pressure allows ? ?Elevated LFTs ?-Might be secondary to congestive hepatopathy.  Monitor ? ?Diabetes mellitus type 2 ?-Metformin on hold.  Continue CBGs with SSI ? ?Depression ?-Zoloft on hold for now because of prolonged QT ? ?Prolonged Qtc ?-Presenting EKG showed QTc of 504.  Zoloft on hold.   Monitor potassium and magnesium and replete if needed. ? ?Physical deconditioning/generalized weakness ?-PT eval ?-Palliative care consultation for goals of care discussion ? ? ?DVT prophylaxis: SCDs ?Code Status: Agreeable for DNR ?Family  Communication: None at bedside ?Disposition Plan: ?Status is: Inpatient ?Remains inpatient appropriate because: Of severity of illness, need for IV antibiotics.  Cardiology/palliative care consultation ? ? ? ?Consultants: Cardiology/palliative care ? ?Procedures: None ? ?Antimicrobials: Rocephin from 08/18/2021 onwards ? ? ?Subjective: ?Patient seen and examined at bedside.  Feels weak and tired.  Complains of some problems with urinary flow including dribbling and incomplete emptying.  Short of breath with exertion.  No fever, seizures or agitation reported. ? ?Objective: ?Vitals:  ? 08/19/21 0815 08/19/21 0900 08/19/21 1000 08/19/21 1006  ?BP:  111/74 132/68 132/68  ?Pulse:      ?Resp:      ?Temp: (!) 97.5 ?F (36.4 ?C)     ?TempSrc: Axillary     ?SpO2:      ?Weight:      ?Height:      ? ? ?Intake/Output Summary (Last 24 hours) at 08/19/2021 1029 ?Last data filed at 08/19/2021 8295 ?Gross per 24 hour  ?Intake 908.41 ml  ?Output 575 ml  ?Net 333.41 ml  ? ?Filed Weights  ? 08/18/21 1051 08/18/21 1848 08/19/21 0500  ?Weight: 52.6 kg 55.9 kg 58.7 kg  ? ? ?Examination: ? ?General exam: Appears calm and comfortable.  Looks chronically ill and deconditioned.  Currently on room air.  Hard of hearing ?Respiratory system: Bilateral decreased breath sounds at bases with basilar crackles ?Cardiovascular system: S1 & S2 heard, Rate controlled ?Gastrointestinal system: Abdomen is nondistended, soft and nontender. Normal bowel sounds heard. ?Extremities: No cyanosis, clubbing; trace lower extremity edema present ?Central nervous system: Awake and alert, slow to respond.  No focal neurological deficits. Moving extremities ?Skin: No rashes, lesions or ulcers ?Psychiatry: No signs of agitation.  Affect  is mostly flat. ? ? ? ?Data Reviewed: I have personally reviewed following labs and imaging studies ? ?CBC: ?Recent Labs  ?Lab 08/18/21 ?1130 08/19/21 ?0300  ?WBC 10.1 8.3  ?NEUTROABS  --  5.3  ?HGB 13.4 11.5*  ?HCT 41.7 34.0*  ?MCV 90.7 91.2  ?PLT 239 195  ? ?Basic Metabolic Panel: ?Recent Labs  ?Lab 08/13/21 ?0358 08/18/21 ?1130 08/18/21 ?1756 08/19/21 ?0300  ?NA 134* 128*  --  130*  ?K 3.6 4.4  --  4.3  ?CL 100 92*  --  99  ?CO2 27 25  --  20*  ?GLUCOSE 126* 195*  --  152*  ?BUN 28* 23  --  21  ?CREATININE 1.02 1.21  --  1.02  ?CALCIUM 8.7* 9.5  --  8.2*  ?MG  --   --  1.6* 2.1  ?PHOS  --   --  3.1  --   ? ?GFR: ?Estimated Creatinine Clearance: 41.6 mL/min (by C-G formula based on SCr of 1.02 mg/dL). ?Liver Function Tests: ?Recent Labs  ?Lab 08/19/21 ?0300  ?AST 74*  ?ALT 56*  ?ALKPHOS 72  ?BILITOT 1.0  ?PROT 5.7*  ?ALBUMIN 2.6*  ? ?No results for input(s): LIPASE, AMYLASE in the last 168 hours. ?No results for input(s): AMMONIA in the last 168 hours. ?Coagulation Profile: ?Recent Labs  ?Lab 08/18/21 ?1130  ?INR 1.2  ? ?Cardiac Enzymes: ?No results for input(s): CKTOTAL, CKMB, CKMBINDEX, TROPONINI in the last 168 hours. ?BNP (last 3 results) ?Recent Labs  ?  07/28/21 ?1219  ?PROBNP 3,863.0*  ? ?HbA1C: ?No results for input(s): HGBA1C in the last 72 hours. ?CBG: ?Recent Labs  ?Lab 08/13/21 ?2017 08/14/21 ?0737 08/14/21 ?1141 08/18/21 ?1959 08/19/21 ?0729  ?GLUCAP 197* 155* 160* 201* 172*  ? ?Lipid Profile: ?No results  for input(s): CHOL, HDL, LDLCALC, TRIG, CHOLHDL, LDLDIRECT in the last 72 hours. ?Thyroid Function Tests: ?Recent Labs  ?  08/18/21 ?2102  ?TSH 7.075*  ? ?Anemia Panel: ?No results for input(s): VITAMINB12, FOLATE, FERRITIN, TIBC, IRON, RETICCTPCT in the last 72 hours. ?Sepsis Labs: ?Recent Labs  ?Lab 08/18/21 ?1340 08/18/21 ?1759 08/18/21 ?1912 08/18/21 ?2102 08/19/21 ?0300  ?PROCALCITON  --   --   --  <0.10  --   ?LATICACIDVEN 5.6* 2.9* 3.3*  --  1.5  ? ? ?Recent Results (from the past 240 hour(s))   ?Resp Panel by RT-PCR (Flu A&B, Covid) Nasopharyngeal Swab     Status: None  ? Collection Time: 08/18/21 11:30 AM  ? Specimen: Nasopharyngeal Swab; Nasopharyngeal(NP) swabs in vial transport medium  ?Result Value Ref

## 2021-08-20 DIAGNOSIS — A419 Sepsis, unspecified organism: Secondary | ICD-10-CM | POA: Diagnosis not present

## 2021-08-20 DIAGNOSIS — Z7189 Other specified counseling: Secondary | ICD-10-CM | POA: Diagnosis not present

## 2021-08-20 DIAGNOSIS — I5032 Chronic diastolic (congestive) heart failure: Secondary | ICD-10-CM | POA: Diagnosis not present

## 2021-08-20 DIAGNOSIS — Z515 Encounter for palliative care: Secondary | ICD-10-CM | POA: Diagnosis not present

## 2021-08-20 DIAGNOSIS — N3000 Acute cystitis without hematuria: Secondary | ICD-10-CM | POA: Diagnosis not present

## 2021-08-20 DIAGNOSIS — N400 Enlarged prostate without lower urinary tract symptoms: Secondary | ICD-10-CM | POA: Diagnosis not present

## 2021-08-20 LAB — HEPATIC FUNCTION PANEL
ALT: 47 U/L — ABNORMAL HIGH (ref 0–44)
AST: 50 U/L — ABNORMAL HIGH (ref 15–41)
Albumin: 2.7 g/dL — ABNORMAL LOW (ref 3.5–5.0)
Alkaline Phosphatase: 66 U/L (ref 38–126)
Bilirubin, Direct: 0.2 mg/dL (ref 0.0–0.2)
Indirect Bilirubin: 0.1 mg/dL — ABNORMAL LOW (ref 0.3–0.9)
Total Bilirubin: 0.3 mg/dL (ref 0.3–1.2)
Total Protein: 5.9 g/dL — ABNORMAL LOW (ref 6.5–8.1)

## 2021-08-20 LAB — GLUCOSE, CAPILLARY
Glucose-Capillary: 194 mg/dL — ABNORMAL HIGH (ref 70–99)
Glucose-Capillary: 174 mg/dL — ABNORMAL HIGH (ref 70–99)
Glucose-Capillary: 132 mg/dL — ABNORMAL HIGH (ref 70–99)
Glucose-Capillary: 156 mg/dL — ABNORMAL HIGH (ref 70–99)

## 2021-08-20 LAB — BASIC METABOLIC PANEL
Anion gap: 8 (ref 5–15)
BUN: 22 mg/dL (ref 8–23)
CO2: 21 mmol/L — ABNORMAL LOW (ref 22–32)
Calcium: 8.5 mg/dL — ABNORMAL LOW (ref 8.9–10.3)
Chloride: 101 mmol/L (ref 98–111)
Creatinine, Ser: 0.94 mg/dL (ref 0.61–1.24)
GFR, Estimated: >60 mL/min (ref >60)
Glucose, Bld: 147 mg/dL — ABNORMAL HIGH (ref 70–99)
Potassium: 3.8 mmol/L (ref 3.5–5.1)
Sodium: 130 mmol/L — ABNORMAL LOW (ref 135–145)

## 2021-08-20 LAB — MAGNESIUM: Magnesium: 1.9 mg/dL (ref 1.7–2.4)

## 2021-08-20 NOTE — Progress Notes (Signed)
?PROGRESS NOTE ? ? ? ?[name]  WVP:[id number] DOB: 1931/01/25 DOA: 08/18/2021 ?PCP: Merrilee Seashore, MD  ? ?Brief Narrative:  ? 88 y.o. male with medical history significant for moderate to severe mitral regurgitation following MitraClip in May 5462, chronic diastolic heart failure, essential pretension, paroxysmal SVT, type 2 diabetes mellitus, BPH, depression, chronic hyponatremia, recent hospitalization from 08/08/2021-08/14/2021 for acute on chronic diastolic heart failure requiring IV Lasix and subsequent oral Lasix as per cardiology recommendations presented with dysuria and low blood pressure.  During outpatient follow-up with Dr. Smith/cardiology on 08/16/2021, he had recommended palliative care evaluation for consideration of possible hospice because of severely limited treatment options given patient's persistent hypotension.  On presentation, patient was intermittently tachycardic with concern for atrial flutter versus SVT.  This was treated with IV amiodarone with subsequent improvement in blood pressure.  Sodium was 128, high-sensitivity troponin was 118 and 112 and 99.  Initial lactate was 2.9, subsequent lactate was 5.6.  UA was suggestive for UTI.  COVID-19/influenza testing were negative.  He was started on IV fluids and antibiotics.  Chest x-ray showed small residual pleural effusions without overt infiltrate, edema or pneumothorax.  CT abdomen/pelvis without contrast showed no ureteral stones nor any evidence of hydronephrosis, ultrasound diverticulosis without diverticulitis and small bilateral pleural effusions with basilar atelectasis versus infiltrate.  Cardiology and palliative care consulted. ? ?Assessment & Plan: ?  ?Severe sepsis: Present on admission ?UTI: Present on admission ?Lactic acidosis ?-Presented with tachycardia, hypotension, lactic acidosis, dysuria with evidence of UTI.  Treated with IV fluids.  Lactic acid level has normalized ?-Off IV fluids.  Cultures negative so far.   COVID-19/influenza testing negative. ?-Procalcitonin less than 0.1.  Doubt that patient has pneumonia. ? ?Chronic diastolic heart failure ?Acute on chronic hypotension ?History of hypertension ?-Patient was recently hospitalized for acute on chronic diastolic heart failure and discharged on Lasix 40 mg on 1 day and 20 mg on the following day as per cardiology recommendations.  Lasix on hold because of worsening hypotension on presentation. ?-Blood pressure still on the lower side: Will not restart Lasix at this time unless started by cardiology.  Might have to consider starting midodrine ?-Strict input and output.  Daily weights.  Fluid restriction.  Continue metoprolol ?-Continue telemetry monitoring ?-Cardiology following.  As per recent cardiology evaluation: Dr. Tamala Julian had recommended palliative care consultation because of severely limited treatment options given patient's persistent hypotension ?-Palliative care following and planning on having a family meeting tomorrow  ? ?Status post MitraClip ?-Has moderate mitral regurgitation on most recent echocardiogram.  Patient is not a candidate for another procedure apparently. ? ?History of PSVT ?-Continue Lopressor.  Patient was tachycardic on presentation and had to be given IV amiodarone for concern for PSVT versus atrial flutter as per cardiology recommendations. ?-Still having intermittent tachycardia. ? ?Hyponatremia, chronic ?-Improving.  Sodium 130 again today.  Monitor. ? ?Hyperlipidemia--continue statin ? ?Hypothyroidism--continue levothyroxine ? ?BPH--continue finasteride.  Flomax was resumed last night. ? ?Elevated LFTs ?-Might be secondary to congestive hepatopathy.  Monitor ? ?Diabetes mellitus type 2 ?-Metformin on hold.  Continue CBGs with SSI ? ?Depression ?-Zoloft on hold for now because of prolonged QT ? ?Prolonged Qtc ?-Presenting EKG showed QTc of 504.  Zoloft on hold.  Monitor potassium and magnesium and replete if needed. ? ?Physical  deconditioning/generalized weakness ?-PT recommends home health PT ?-Palliative care consultation for goals of care discussion ? ?Severe malnutrition--follow nutrition recommendations ? ? ?DVT prophylaxis: SCDs ?Code Status: DNR ?Family Communication: None at  bedside ?Disposition Plan: ?Status is: Inpatient ?Remains inpatient appropriate because: Of severity of illness, need for IV antibiotics.   ? ? ?Consultants: Cardiology/palliative care ? ?Procedures: None ? ?Antimicrobials: Rocephin from 08/18/2021 onwards ? ? ?Subjective: ?Patient seen and examined at bedside.  Still short of breath with exertion.  Felt dizzy earlier this morning.  No overnight fever, agitation, seizures or vomiting reported. ?Objective: ?Vitals:  ? 08/20/21 0539 08/20/21 0557 08/20/21 0600 08/20/21 0603  ?BP: 96/69   (!) 91/59  ?Pulse: (!) 138 (!) 129 (!) 139 (!) 136  ?Resp: (!) 23  (!) 25 (!) 24  ?Temp:      ?TempSrc:      ?SpO2: 97%  96% 97%  ?Weight:      ?Height:      ? ? ?Intake/Output Summary (Last 24 hours) at 08/20/2021 0750 ?Last data filed at 08/20/2021 0700 ?Gross per 24 hour  ?Intake 460 ml  ?Output 1140 ml  ?Net -680 ml  ? ? ?Filed Weights  ? 08/18/21 1848 08/19/21 0500 08/20/21 0500  ?Weight: 55.9 kg 58.7 kg 57.9 kg  ? ? ?Examination: ? ?General exam: No distress.  Intermittently on 2 L oxygen via nasal and.  Looks chronically ill and deconditioned. Hard of hearing ?Respiratory system: Decreased breath sounds at bases bilaterally with scattered crackles and intermittent tachypnea  ?cardiovascular system: Currently tachycardic; S1-S2 heard  ?gastrointestinal system: Abdomen is distended slightly; soft and nontender.  Bowel sounds are heard  ?extremities: Mild lower extremity edema present; no clubbing  ?Central nervous system: Still slow to respond; awake.  No focal neurological deficits.  Moves extremities ?Skin: No obvious ecchymosis/lesions  ?psychiatry: Flat affect.  Currently not agitated ? ? ?Data Reviewed: I have personally  reviewed following labs and imaging studies ? ?CBC: ?Recent Labs  ?Lab 08/18/21 ?1130 08/19/21 ?0300  ?WBC 10.1 8.3  ?NEUTROABS  --  5.3  ?HGB 13.4 11.5*  ?HCT 41.7 34.0*  ?MCV 90.7 91.2  ?PLT 239 195  ? ? ?Basic Metabolic Panel: ?Recent Labs  ?Lab 08/18/21 ?1130 08/18/21 ?1756 08/19/21 ?0300 08/20/21 ?0258  ?NA 128*  --  130* 130*  ?K 4.4  --  4.3 3.8  ?CL 92*  --  99 101  ?CO2 25  --  20* 21*  ?GLUCOSE 195*  --  152* 147*  ?BUN 23  --  21 22  ?CREATININE 1.21  --  1.02 0.94  ?CALCIUM 9.5  --  8.2* 8.5*  ?MG  --  1.6* 2.1 1.9  ?PHOS  --  3.1  --   --   ? ? ?GFR: ?Estimated Creatinine Clearance: 44.5 mL/min (by C-G formula based on SCr of 0.94 mg/dL). ?Liver Function Tests: ?Recent Labs  ?Lab 08/19/21 ?0300 08/20/21 ?0258  ?AST 74* 50*  ?ALT 56* 47*  ?ALKPHOS 72 66  ?BILITOT 1.0 0.3  ?PROT 5.7* 5.9*  ?ALBUMIN 2.6* 2.7*  ? ? ?No results for input(s): LIPASE, AMYLASE in the last 168 hours. ?No results for input(s): AMMONIA in the last 168 hours. ?Coagulation Profile: ?Recent Labs  ?Lab 08/18/21 ?1130  ?INR 1.2  ? ? ?Cardiac Enzymes: ?No results for input(s): CKTOTAL, CKMB, CKMBINDEX, TROPONINI in the last 168 hours. ?BNP (last 3 results) ?Recent Labs  ?  07/28/21 ?1219  ?PROBNP 3,863.0*  ? ? ?HbA1C: ?No results for input(s): HGBA1C in the last 72 hours. ?CBG: ?Recent Labs  ?Lab 08/19/21 ?0729 08/19/21 ?1152 08/19/21 ?1651 08/19/21 ?2120 08/20/21 ?4540  ?GLUCAP 172* 137* 184* 153* 194*  ? ? ?Lipid  Profile: ?No results for input(s): CHOL, HDL, LDLCALC, TRIG, CHOLHDL, LDLDIRECT in the last 72 hours. ?Thyroid Function Tests: ?Recent Labs  ?  08/18/21 ?2102  ?TSH 7.075*  ? ? ?Anemia Panel: ?No results for input(s): VITAMINB12, FOLATE, FERRITIN, TIBC, IRON, RETICCTPCT in the last 72 hours. ?Sepsis Labs: ?Recent Labs  ?Lab 08/18/21 ?1340 08/18/21 ?1759 08/18/21 ?1912 08/18/21 ?2102 08/19/21 ?0300  ?PROCALCITON  --   --   --  <0.10  --   ?LATICACIDVEN 5.6* 2.9* 3.3*  --  1.5  ? ? ? ?Recent Results (from the past 240 hour(s))   ?Blood Culture (routine x 2)     Status: None (Preliminary result)  ? Collection Time: 08/18/21 11:27 AM  ? Specimen: BLOOD LEFT FOREARM  ?Result Value Ref Range Status  ? Specimen Description   Final  ?  BLOOD L

## 2021-08-20 NOTE — Progress Notes (Signed)
Patient in sinus rhythm today.  Cardiology to hold off on further evaluations until goals of care have been addressed by palliative medicine.  There is a discussion of home with hospice. ? ?Allegra Lai, MD ?

## 2021-08-20 NOTE — Progress Notes (Signed)
? ?                                                                                                                                                     ?                                                   ?  Daily Progress Note  ? ?Patient Name: Adrian Olson       Date: 08/20/2021 ?DOB: 02/17/1931  Age: 88 y.o. MRN#: [id number] ?Attending Physician: Aline August, MD ?Primary Care Physician: Merrilee Seashore, MD ?Admit Date: 08/18/2021 ? ?Reason for Consultation/Follow-up: Establishing goals of care ? ?Subjective: ?I met today with [name], his daughter, [name], and his son-in-law, [name]. ? ?We reviewed things most important to Mr. Morejon and talked about his goal of being home and spending time with his wife. ? ?Discussed continued changes in his functional status related to continued progression of his underlying comorbidities, particularly his heart failure.  He reports discussing with Dr. Tamala Julian recently about consideration for electing home hospice benefits. ? ?We then reviewed hospice benefits and talked about how these differ from home health.  We discussed the difference between hospice and palliative care and I expressed that I felt that home hospice would be a more robust care plan if his goal is to focus on quality of life and avoiding future hospitalization. ? ?He and his daughter were both open to conversation and had appropriate questions regarding his overall care plan and prognosis as well as questions to try and determine what medical support at home would best line up with things most important to him. ? ?Following discussion, he reports that he and his daughter need more time to talk but they certainly appear to be openly discussing potential for home with hospice and were discussing that this is likely the best support that he is going to have moving forward.  Discussed that I will follow-up tomorrow, or his daughter will call sooner if they would like for me to place referral to discuss with  hospice liaison.  Referral had previously been placed for outpatient services with [facility].  They are familiar with them and will likely select them for home hospice services if this is what they desire. ? ?Total time: 55 minutes ? ?Micheline Rough, MD ?[place] [facility] ?[phone] ? ?

## 2021-08-20 NOTE — Progress Notes (Signed)
Physical Therapy Treatment ?Patient Details ?Name: Adrian Olson ?MRN: [id number] ?DOB: Jul 31, 1930 ?Today's Date: 08/20/2021 ? ? ?History of Present Illness Pt is 88 yo male admitted with hyotension and sepsis 2* UTI. Pt with hx including mitral regurgitation despite clipping, DM, HTN, lumbar disc disease ? ?  ?PT Comments  ? ? Pt continues very cooperative and up to ambulate in hall but fatigues easily - O2 on RA 94% with BP 100/31 - RN aware.  ?Recommendations for follow up therapy are one component of a multi-disciplinary discharge planning process, led by the attending physician.  Recommendations may be updated based on patient status, additional functional criteria and insurance authorization. ? ?Follow Up Recommendations ? Home health PT ?  ?  ?Assistance Recommended at Discharge Intermittent Supervision/Assistance  ?Patient can return home with the following A little help with walking and/or transfers;A little help with bathing/dressing/bathroom;Assistance with cooking/housework;Help with stairs or ramp for entrance ?  ?Equipment Recommendations ? None recommended by PT  ?  ?Recommendations for Other Services   ? ? ?  ?Precautions / Restrictions Precautions ?Precautions: Fall ?Restrictions ?Weight Bearing Restrictions: No  ?  ? ?Mobility ? Bed Mobility ?Overal bed mobility: Needs Assistance ?Bed Mobility: Supine to Sit ?  ?  ?Supine to sit: Min assist ?  ?  ?General bed mobility comments: increased time with min assist to bring trunk to upright ?  ? ?Transfers ?Overall transfer level: Needs assistance ?Equipment used: Rolling walker (2 wheels) ?Transfers: Sit to/from Stand ?Sit to Stand: Min guard ?  ?  ?  ?  ?  ?General transfer comment: min guard for safety; cues to push with hands ?  ? ?Ambulation/Gait ?Ambulation/Gait assistance: Min guard ?Gait Distance (Feet): 75 Feet ?Assistive device: Rolling walker (2 wheels) ?Gait Pattern/deviations: Step-through pattern, Decreased stride length ?Gait velocity:  decreased ?  ?  ?General Gait Details: min guard for safety and cues for RW proximity; pt ambulates with kyphotic posture with mildly flexed knees; distance limited by IV leaking ? ? ?Stairs ?  ?  ?  ?  ?  ? ? ?Wheelchair Mobility ?  ? ?Modified Rankin (Stroke Patients Only) ?  ? ? ?  ?Balance Overall balance assessment: Needs assistance ?Sitting-balance support: No upper extremity supported ?Sitting balance-Leahy Scale: Good ?  ?  ?Standing balance support: Bilateral upper extremity supported, Reliant on assistive device for balance ?Standing balance-Leahy Scale: Poor ?Standing balance comment: Uses RWbut no LOB ?  ?  ?  ?  ?  ?  ?  ?  ?  ?  ?  ?  ? ?  ?Cognition Arousal/Alertness: Awake/alert ?Behavior During Therapy: [facility] for tasks assessed/performed ?Overall Cognitive Status: Within Functional Limits for tasks assessed ?  ?  ?  ?  ?  ?  ?  ?  ?  ?  ?  ?  ?  ?  ?  ?  ?  ?  ?  ? ?  ?Exercises   ? ?  ?General Comments   ?  ?  ? ?Pertinent Vitals/Pain Pain Assessment ?Pain Assessment: No/denies pain  ? ? ?Home Living   ?  ?  ?  ?  ?  ?  ?  ?  ?  ?   ?  ?Prior Function    ?  ?  ?   ? ?PT Goals (current goals can now be found in the care plan section) Acute Rehab PT Goals ?Patient Stated Goal: return home ?PT Goal Formulation: With patient ?Time For  Goal Achievement: 08/24/21 ?Potential to Achieve Goals: Good ?Progress towards PT goals: Progressing toward goals ? ?  ?Frequency ? ? ? Min 3X/week ? ? ? ?  ?PT Plan Current plan remains appropriate  ? ? ?Co-evaluation   ?  ?  ?  ?  ? ?  ?AM-PAC PT "6 Clicks" Mobility   ?Outcome Measure ? Help needed turning from your back to your side while in a flat bed without using bedrails?: A Little ?Help needed moving from lying on your back to sitting on the side of a flat bed without using bedrails?: A Little ?Help needed moving to and from a bed to a chair (including a wheelchair)?: A Little ?Help needed standing up from a chair using your arms (e.g., wheelchair or bedside  chair)?: A Little ?Help needed to walk in hospital room?: Total ?Help needed climbing 3-5 steps with a railing? : Total ?6 Click Score: 14 ? ?  ?End of Session Equipment Utilized During Treatment: Gait belt ?Activity Tolerance: Patient tolerated treatment well;Patient limited by fatigue ?Patient left: in chair;with call bell/phone within reach;with chair alarm set ?Nurse Communication: Mobility status ?PT Visit Diagnosis: Other abnormalities of gait and mobility (R26.89);Muscle weakness (generalized) (M62.81) ?  ? ? ?Time: 2952-8413 ?PT Time Calculation (min) (ACUTE ONLY): 31 min ? ?Charges:  $Gait Training: 8-22 mins ?$Therapeutic Activity: 8-22 mins          ?          ? ?Debe Coder PT ?Acute Rehabilitation Services ?Pager [phone] ?Office [phone] ? ? ? ?Eunice Oldaker ?08/20/2021, 10:51 AM ? ?

## 2021-08-20 NOTE — Consult Note (Signed)
? ?                                                                                ?Consultation Note ?Date: 08/20/2021  ? ?Patient Name: Adrian Olson  ?DOB: 1931/01/03  MRN: [id number]  Age / Sex: 88 y.o., male  ?PCP: Merrilee Seashore, MD ?Referring Physician: Aline August, MD ? ?Reason for Consultation: Establishing goals of care ? ?HPI/Patient Profile: 88 y.o. male  with past medical history of moderate to severe mitral regurgitation, prior MitraClip, chronic diastolic heart failure, essential hypertension, paroxysmal SVT, type 2 diabetes, BPH, depression, chronic hyponatremia, multiple recent hospitalizations (5 in 6 months) admitted on 08/18/2021 with UTI with sepsis and heart failure.  He was recently seen by Dr. Daneen Schick following most recent hospitalization at which point in time recommendation was for palliative care consult to discuss potential home hospice services.  He was subsequently admitted in the interim prior to having home palliative visit.  Palliative consulted as an inpatient for goals of care. ? ?Clinical Assessment and Goals of Care: ?Palliative care consult received.  Chart reviewed including personal review of pertinent labs and imaging. ? ?I met today with Adrian Olson.  He is a very pleasant gentleman sitting in bed in no distress at time of my encounter. ? ?He reports the most important things to him are his family, including his wife and daughter, his faith, and being at home. ? ?He worked in Programmer, applications, enjoys the outdoors and music, and is a Clinical cytogeneticist. ? ?I introduced palliative care as specialized medical care for people living with serious illness. It focuses on providing relief from the symptoms and stress of a serious illness. The goal is to improve quality of life for both the patient and the family. ? ?We discussed clinical course with continued decline in his functional status and recurrent hospitalizations over the last several months as well as wishes  moving forward in regard to advanced directives.  Concepts specific to code status and rehospitalization discussed.  We discussed difference between a aggressive medical intervention path and a palliative, comfort focused care path.  Values and goals of care important to patient and family were attempted to be elicited. ? ?He reports that he was told that home hospice may be his best bet moving forward and we briefly discussed what this would entail. ? ?Following our conversations, he stated it would be helpful to have his daughter come tomorrow for another meeting in person to discuss further. ? ?I called and was able to reach his daughter, Adrian Olson, we briefly reviewed his conversation.  Adrian Olson says that they have been thinking about options and is very open to meeting to further discuss tomorrow.  Concern remains that currently he is getting home health care and Adrian Olson knows that the services (PT/OT) would be discontinued if home hospice is elected.  Discussed that we can talk about different care plans and what these would likely look like in home environment at meeting tomorrow. ?  ?Melissa needs to find somebody to stay with mother while she comes to the hospital.  She will call me or ask nurse to page me when she arrives tomorrow  for another meeting. ?  ?SUMMARY OF RECOMMENDATIONS   ?-DNR/DNI ?-Initial goals of care meeting today.  Plan for follow-up meeting with family tomorrow to further discuss. ? ?Code Status/Advance Care Planning: ?DNR ? ?Prognosis:  ?<6 months if his disease follows its natural course and he should qualify for home hospice services if so desired ? ?Discharge Planning: To Be Determined  ? ?  ? ?Primary Diagnoses: ?Present on Admission: ? Severe sepsis ([place]) ? Acute cystitis ? Hypotension ? Prolonged QT interval ? Hypomagnesemia ? Type 2 diabetes mellitus with hyperlipidemia ([place]) ? Hypothyroidism ? Hyperlipidemia ? Chronic diastolic (congestive) heart failure (HCC) ? Depression ? BPH  (benign prostatic hyperplasia) ? ? ?I have reviewed the medical record, interviewed the patient and family, and examined the patient. The following aspects are pertinent. ? ?Past Medical History:  ?Diagnosis Date  ? Bilateral carotid artery disease ([place])   ? Severe Left and moderate Right  ? Diabetes mellitus without complication ([place])   ? ED (erectile dysfunction)   ? Hyperlipidemia   ? Hypertension   ? Lumbar disc disease   ? Mitral valve prolapse   ? moderate to severe MR, LVEF 55% Echo 2010  ? Moderate mitral regurgitation 03/16/2014  ? S/P mitral valve clip implantation 10/09/2019  ? s/p TEER using 2 MitraClip XTW devices, first device positioned A2 P2, second device positioned A1 P1 by Dr. Burt Knack  ? ?Social History  ? ?Socioeconomic History  ? Marital status: Married  ?  Spouse name: Not on file  ? Number of children: Not on file  ? Years of education: Not on file  ? Highest education level: Not on file  ?Occupational History  ? Not on file  ?Tobacco Use  ? Smoking status: Never  ? Smokeless tobacco: Never  ?Vaping Use  ? Vaping Use: Never used  ?Substance and Sexual Activity  ? Alcohol use: No  ?  Alcohol/week: 0.0 standard drinks  ? Drug use: No  ? Sexual activity: Not on file  ?Other Topics Concern  ? Not on file  ?Social History Narrative  ? Not on file  ? ?Social Determinants of Health  ? ?Financial Resource Strain: Not on file  ?Food Insecurity: Not on file  ?Transportation Needs: Not on file  ?Physical Activity: Not on file  ?Stress: Not on file  ?Social Connections: Not on file  ? ?Family History  ?Problem Relation Age of Onset  ? Cancer Mother   ?     ovarian  ? Diabetes Father   ? Other Father   ?     enlarged prostate  ? Stroke Father   ? Heart disease Father   ? Heart disease Sister   ? Obesity Sister   ? Dementia Sister   ? Diabetes Sister   ? Other Sister   ?     nornmal old age problems  ? ?Scheduled Meds: ? aspirin EC  81 mg Oral Daily  ? Chlorhexidine Gluconate Cloth  6 each Topical Daily  ?  finasteride  5 mg Oral Daily  ? insulin aspart  0-6 Units Subcutaneous TID WC  ? levothyroxine  100 mcg Oral Q0600  ? mouth rinse  15 mL Mouth Rinse BID  ? metoprolol tartrate  12.5 mg Oral BID  ? multivitamin with minerals  1 tablet Oral Daily  ? pravastatin  20 mg Oral QHS  ? tamsulosin  0.4 mg Oral QPC supper  ? ?Continuous Infusions: ? cefTRIAXone (ROCEPHIN)  IV Stopped (08/19/21 1615)  ? ?  PRN Meds:.acetaminophen **OR** acetaminophen, melatonin ?Medications Prior to Admission:  ?Prior to Admission medications   ?Medication Sig Start Date End Date Taking? Authorizing Provider  ?albuterol (VENTOLIN HFA) 108 (90 Base) MCG/ACT inhaler Inhale 2 puffs into the lungs every 6 (six) hours as needed for wheezing or shortness of breath. 06/21/21  Yes Cobb, Karie Schwalbe, NP  ?aspirin 81 MG EC tablet Take 81 mg by mouth daily.   Yes [provider]  ?cetirizine (ZYRTEC ALLERGY) 10 MG tablet Take 1 tablet (10 mg total) by mouth daily. 06/21/21  Yes Clayton Bibles, NP  ?Cholecalciferol (VITAMIN D) 2000 UNITS tablet Take 2,000 Units by mouth daily at 12 noon.    Yes [provider]  ?finasteride (PROSCAR) 5 MG tablet Take 5 mg by mouth daily at 12 noon.    Yes [provider]  ?fluticasone (FLONASE) 50 MCG/ACT nasal spray Place 2 sprays into both nostrils daily. ?Patient taking differently: Place 2 sprays into both nostrils daily as needed for allergies. 06/21/21  Yes Cobb, Karie Schwalbe, NP  ?furosemide (LASIX) 20 MG tablet Take one tablet by mouth every other day, alternating with 7m once daily every other day 08/16/21  Yes SBelva Crome MD  ?Glycopyrrolate-Formoterol (BEVESPI AEROSPHERE) 9-4.8 MCG/ACT AERO Inhale 2 puffs into the lungs 2 (two) times daily. 08/03/21  Yes DFreddi Starr MD  ?levothyroxine (SYNTHROID) 100 MCG tablet Take 1 tablet by mouth daily. 07/22/21  Yes [provider]  ?metFORMIN (GLUCOPHAGE) 500 MG tablet Take 500 mg by mouth every evening.  04/23/16  Yes [provider]  ?metoprolol tartrate (LOPRESSOR) 25 MG tablet Take 0.5 tablets (12.5 mg total) by mouth 2 (two) times daily. 08/14/21 12/12/21 Yes LOswald Hillock MD  ?[facility] See admin instruc

## 2021-08-21 DIAGNOSIS — N3000 Acute cystitis without hematuria: Secondary | ICD-10-CM | POA: Diagnosis not present

## 2021-08-21 DIAGNOSIS — N400 Enlarged prostate without lower urinary tract symptoms: Secondary | ICD-10-CM | POA: Diagnosis not present

## 2021-08-21 DIAGNOSIS — I5032 Chronic diastolic (congestive) heart failure: Secondary | ICD-10-CM | POA: Diagnosis not present

## 2021-08-21 DIAGNOSIS — A419 Sepsis, unspecified organism: Secondary | ICD-10-CM | POA: Diagnosis not present

## 2021-08-21 DIAGNOSIS — Z515 Encounter for palliative care: Secondary | ICD-10-CM | POA: Diagnosis not present

## 2021-08-21 DIAGNOSIS — Z7189 Other specified counseling: Secondary | ICD-10-CM | POA: Diagnosis not present

## 2021-08-21 LAB — BASIC METABOLIC PANEL
Anion gap: 12 (ref 5–15)
BUN: 27 mg/dL — ABNORMAL HIGH (ref 8–23)
CO2: 22 mmol/L (ref 22–32)
Calcium: 8.7 mg/dL — ABNORMAL LOW (ref 8.9–10.3)
Chloride: 99 mmol/L (ref 98–111)
Creatinine, Ser: 1.16 mg/dL (ref 0.61–1.24)
GFR, Estimated: >60 mL/min (ref >60)
Glucose, Bld: 148 mg/dL — ABNORMAL HIGH (ref 70–99)
Potassium: 4.2 mmol/L (ref 3.5–5.1)
Sodium: 133 mmol/L — ABNORMAL LOW (ref 135–145)

## 2021-08-21 LAB — URINE CULTURE: Culture: NO GROWTH

## 2021-08-21 LAB — GLUCOSE, CAPILLARY
Glucose-Capillary: 143 mg/dL — ABNORMAL HIGH (ref 70–99)
Glucose-Capillary: 177 mg/dL — ABNORMAL HIGH (ref 70–99)
Glucose-Capillary: 225 mg/dL — ABNORMAL HIGH (ref 70–99)
Glucose-Capillary: 121 mg/dL — ABNORMAL HIGH (ref 70–99)

## 2021-08-21 LAB — MAGNESIUM: Magnesium: 1.9 mg/dL (ref 1.7–2.4)

## 2021-08-21 NOTE — Progress Notes (Signed)
?PROGRESS NOTE ? ? ? ?Adrian Olson  RKY:[id number] DOB: 21-Jun-1930 DOA: 08/18/2021 ?PCP: Merrilee Seashore, MD  ? ?Brief Narrative:  ? 88 y.o. male with medical history significant for moderate to severe mitral regurgitation following MitraClip in May 3151, chronic diastolic heart failure, essential pretension, paroxysmal SVT, type 2 diabetes mellitus, BPH, depression, chronic hyponatremia, recent hospitalization from 08/08/2021-08/14/2021 for acute on chronic diastolic heart failure requiring IV Lasix and subsequent oral Lasix as per cardiology recommendations presented with dysuria and low blood pressure.  During outpatient follow-up with Dr. Smith/cardiology on 08/16/2021, he had recommended palliative care evaluation for consideration of possible hospice because of severely limited treatment options given patient's persistent hypotension.  On presentation, patient was intermittently tachycardic with concern for atrial flutter versus SVT.  This was treated with IV amiodarone with subsequent improvement in blood pressure.  Sodium was 128, high-sensitivity troponin was 118 and 112 and 99.  Initial lactate was 2.9, subsequent lactate was 5.6.  UA was suggestive for UTI.  COVID-19/influenza testing were negative.  He was started on IV fluids and antibiotics.  Chest x-ray showed small residual pleural effusions without overt infiltrate, edema or pneumothorax.  CT abdomen/pelvis without contrast showed no ureteral stones nor any evidence of hydronephrosis, ultrasound diverticulosis without diverticulitis and small bilateral pleural effusions with basilar atelectasis versus infiltrate.  Cardiology and palliative care consulted. ? ?Assessment & Plan: ?  ?Severe sepsis: Present on admission ?UTI: Present on admission ?Lactic acidosis ?-Off IV fluids.  Cultures negative so far.  COVID-19/influenza testing negative. ?-Procalcitonin less than 0.1.  Doubt that patient has pneumonia. ?-Continue Rocephin ? ?Chronic diastolic heart  failure ?Acute on chronic hypotension ?History of hypertension ?-Patient was recently hospitalized for acute on chronic diastolic heart failure and discharged on Lasix 40 mg on 1 day and 20 mg on the following day as per cardiology recommendations.  Lasix on hold because of worsening hypotension on presentation. ?-Strict input and output.  Daily weights.  Fluid restriction.  Continue metoprolol ?-Continue telemetry monitoring ?-Cardiology following.  As per recent cardiology evaluation: Dr. Tamala Julian had recommended palliative care consultation because of severely limited treatment options given patient's persistent hypotension ?-Palliative care following: Patient/family contemplating home hospice.  Not decided yet. ? ?Status post MitraClip ?-Has moderate mitral regurgitation on most recent echocardiogram.  Patient is not a candidate for another procedure apparently. ? ?History of PSVT ?-Continue Lopressor.  Patient was tachycardic on presentation and had to be given IV amiodarone for concern for PSVT versus atrial flutter as per cardiology recommendations. ?-Still having intermittent tachycardia. ? ?Hyponatremia, chronic ?-Improving.  Sodium 133  today.  Monitor. ? ?Hyperlipidemia--continue statin ? ?Hypothyroidism--continue levothyroxine ? ?BPH--continue finasteride.  Flomax was discontinued again on 08/20/2021 because of episodes of hypotension. ? ?Elevated LFTs ?-Might be secondary to congestive hepatopathy.  Monitor intermittently ? ?Diabetes mellitus type 2 ?-Metformin on hold.  Continue CBGs with SSI ? ?Depression ?-Zoloft on hold for now because of prolonged QT ? ?Prolonged Qtc ?-Presenting EKG showed QTc of 504.  Zoloft on hold.  Monitor potassium and magnesium and replete if needed. ? ?Physical deconditioning/generalized weakness ?-PT recommends home health PT ?-Palliative care following ? ?Severe malnutrition--follow nutrition recommendations ? ? ?DVT prophylaxis: SCDs ?Code Status: DNR ?Family Communication:  None at bedside ?Disposition Plan: ?Status is: Inpatient ?Remains inpatient appropriate because: Of severity of illness, need for IV antibiotics.   ? ? ?Consultants: Cardiology/palliative care ? ?Procedures: None ? ?Antimicrobials: Rocephin from 08/18/2021 onwards ? ? ?Subjective: ?Patient seen and examined at bedside.  Still feels very weak and short of breath with exertion.  Feels slightly better.  No agitation, fever, vomiting reported.   ?Objective: ?Vitals:  ? 08/21/21 0400 08/21/21 0500 08/21/21 0600 08/21/21 0700  ?BP: 127/65 114/72 136/62   ?Pulse: 74 80 76 68  ?Resp: (!) 23 15 (!) 26 (!) 29  ?Temp:      ?TempSrc:      ?SpO2: 93% 98% 95% 96%  ?Weight:      ?Height:      ? ? ?Intake/Output Summary (Last 24 hours) at 08/21/2021 0745 ?Last data filed at 08/20/2021 1634 ?Gross per 24 hour  ?Intake 686.59 ml  ?Output 1 ml  ?Net 685.59 ml  ? ? ?Filed Weights  ? 08/18/21 1848 08/19/21 0500 08/20/21 0500  ?Weight: 55.9 kg 58.7 kg 57.9 kg  ? ? ?Examination: ? ?General exam: On 2 L oxygen via nasal cannula intermittently.  No acute distress.  Looks chronically ill and deconditioned. Hard of hearing ?Respiratory system: Bilateral decreased breath sounds at bases with scattered crackles and intermittent tachypnea ?cardiovascular system: S1-S2 heard; currently rate controlled ?gastrointestinal system: Abdomen is mildly distended; soft and nontender.  Normal bowel sounds heard  ?extremities: No cyanosis; trace lower extremity edema present ?Central nervous system: Alert; slow to respond.  No focal neurological deficits.  Moving extremities  ?skin: No obvious petechiae/rashes  ?psychiatry: No signs of agitation.  Affect is flat ? ?Data Reviewed: I have personally reviewed following labs and imaging studies ? ?CBC: ?Recent Labs  ?Lab 08/18/21 ?1130 08/19/21 ?0300  ?WBC 10.1 8.3  ?NEUTROABS  --  5.3  ?HGB 13.4 11.5*  ?HCT 41.7 34.0*  ?MCV 90.7 91.2  ?PLT 239 195  ? ? ?Basic Metabolic Panel: ?Recent Labs  ?Lab 08/18/21 ?1130  08/18/21 ?1756 08/19/21 ?0300 08/20/21 ?0258 08/21/21 ?0253  ?NA 128*  --  130* 130* 133*  ?K 4.4  --  4.3 3.8 4.2  ?CL 92*  --  99 101 99  ?CO2 25  --  20* 21* 22  ?GLUCOSE 195*  --  152* 147* 148*  ?BUN 23  --  21 22 27*  ?CREATININE 1.21  --  1.02 0.94 1.16  ?CALCIUM 9.5  --  8.2* 8.5* 8.7*  ?MG  --  1.6* 2.1 1.9 1.9  ?PHOS  --  3.1  --   --   --   ? ? ?GFR: ?Estimated Creatinine Clearance: 36 mL/min (by C-G formula based on SCr of 1.16 mg/dL). ?Liver Function Tests: ?Recent Labs  ?Lab 08/19/21 ?0300 08/20/21 ?0258  ?AST 74* 50*  ?ALT 56* 47*  ?ALKPHOS 72 66  ?BILITOT 1.0 0.3  ?PROT 5.7* 5.9*  ?ALBUMIN 2.6* 2.7*  ? ? ?No results for input(s): LIPASE, AMYLASE in the last 168 hours. ?No results for input(s): AMMONIA in the last 168 hours. ?Coagulation Profile: ?Recent Labs  ?Lab 08/18/21 ?1130  ?INR 1.2  ? ? ?Cardiac Enzymes: ?No results for input(s): CKTOTAL, CKMB, CKMBINDEX, TROPONINI in the last 168 hours. ?BNP (last 3 results) ?Recent Labs  ?  07/28/21 ?1219  ?PROBNP 3,863.0*  ? ? ?HbA1C: ?No results for input(s): HGBA1C in the last 72 hours. ?CBG: ?Recent Labs  ?Lab 08/19/21 ?2120 08/20/21 ?0724 08/20/21 ?1145 08/20/21 ?1613 08/20/21 ?2122  ?GLUCAP 153* 194* 174* 132* 156*  ? ? ?Lipid Profile: ?No results for input(s): CHOL, HDL, LDLCALC, TRIG, CHOLHDL, LDLDIRECT in the last 72 hours. ?Thyroid Function Tests: ?Recent Labs  ?  08/18/21 ?2102  ?TSH 7.075*  ? ? ?Anemia Panel: ?  No results for input(s): VITAMINB12, FOLATE, FERRITIN, TIBC, IRON, RETICCTPCT in the last 72 hours. ?Sepsis Labs: ?Recent Labs  ?Lab 08/18/21 ?1340 08/18/21 ?1759 08/18/21 ?1912 08/18/21 ?2102 08/19/21 ?0300  ?PROCALCITON  --   --   --  <0.10  --   ?LATICACIDVEN 5.6* 2.9* 3.3*  --  1.5  ? ? ? ?Recent Results (from the past 240 hour(s))  ?Blood Culture (routine x 2)     Status: None (Preliminary result)  ? Collection Time: 08/18/21 11:27 AM  ? Specimen: BLOOD LEFT FOREARM  ?Result Value Ref Range Status  ? Specimen Description   Final  ?   BLOOD LEFT FOREARM ?Performed at [facility], [address] [place], [place] [address] ?  ? Special Requests   Final  ?  BOTTLES DRAWN AEROBIC AND ANAEROBIC Blood Culture adequate volum

## 2021-08-21 NOTE — Plan of Care (Signed)
  Problem: Activity: Goal: Risk for activity intolerance will decrease Outcome: Progressing   Problem: Nutrition: Goal: Adequate nutrition will be maintained Outcome: Progressing   Problem: Pain Managment: Goal: General experience of comfort will improve Outcome: Progressing   Problem: Safety: Goal: Ability to remain free from injury will improve Outcome: Progressing   

## 2021-08-21 NOTE — TOC Initial Note (Signed)
Transition of Care (TOC) - Initial/Assessment Note  ? ? ?Patient Details  ?Name: Adrian Olson ?MRN: [id number] ?Date of Birth: Jul 21, 1930 ? ?Transition of Care (TOC) CM/SW Contact:    ?Tawanna Cooler, RN ?Phone Number: ?08/21/2021, 12:32 PM ? ?Clinical Narrative:                 ? ?Patient has been on service with [facility]. Palliative care consult has been initiated, and patient is considering going home with hospice care.   ?TOC following for patient decision and discharge needs.  ? ? ?Expected Discharge Plan: [place] ?Barriers to Discharge: Continued Medical Work up ? ? ?Patient Goals and CMS Choice ?Patient states their goals for this hospitalization and ongoing recovery are:: be at home ?  ?  ? ?Expected Discharge Plan and Services ?Expected Discharge Plan: [place] ?  ?  ?  ?Living arrangements for the past 2 months: Single Family Home ?                ?  ? ?Prior Living Arrangements/Services ?Living arrangements for the past 2 months: [place] ?Lives with:: Spouse ?Patient language and need for interpreter reviewed:: Yes ?       ?Need for Family Participation in Patient Care: Yes (Comment) ?Care giver support system in place?: Yes (comment) ?  ?Criminal Activity/Legal Involvement Pertinent to Current Situation/Hospitalization: No - Comment as needed ? ?Activities of Daily Living ?Home Assistive Devices/Equipment: Gilford Rile (specify type) ?ADL Screening (condition at time of admission) ?Patient's cognitive ability adequate to safely complete daily activities?: Yes ?Is the patient deaf or have difficulty hearing?: No ?Does the patient have difficulty seeing, even when wearing glasses/contacts?: No ?Does the patient have difficulty concentrating, remembering, or making decisions?: No ?Patient able to express need for assistance with ADLs?: Yes ?Does the patient have difficulty dressing or bathing?: Yes ?Independently performs ADLs?: No ?Communication: Independent ?Dressing  (OT): Needs assistance ?Is this a change from baseline?: Pre-admission baseline ?Does the patient have difficulty walking or climbing stairs?: Yes ?Weakness of Legs: Both ?Weakness of Arms/Hands: None ? ?Emotional Assessment ?  ?Orientation: : Oriented to Self, Oriented to Place, Oriented to  Time, Oriented to Situation ?Alcohol / Substance Use: Not Applicable ?Psych Involvement: No (comment) ? ?Admission diagnosis:  Acute cystitis with hematuria [N30.01] ?Severe sepsis ([place]) [A41.9, R65.20] ?Sepsis, due to unspecified organism, unspecified whether acute organ dysfunction present ([place]) [A41.9] ?Patient Active Problem List  ? Diagnosis Date Noted  ? Severe sepsis ([place]) 08/18/2021  ? Acute cystitis 08/18/2021  ? Hypotension 08/18/2021  ? Prolonged QT interval 08/18/2021  ? Generalized weakness 08/18/2021  ? Hypomagnesemia 08/18/2021  ? Protein-calorie malnutrition, severe 08/11/2021  ? NSTEMI (non-ST elevated myocardial infarction) ([place])   ? Acute CHF (congestive heart failure) ([place]) 08/08/2021  ? Stage 3b chronic kidney disease (CKD) ([place]) 06/24/2021  ? CHF (congestive heart failure) ([place]) 06/23/2021  ? LFT elevation   ? DOE (dyspnea on exertion) 06/21/2021  ? Acute rhinitis 06/21/2021  ? Pneumonia 06/14/2021  ? Fatigue 05/18/2021  ? Type 2 diabetes mellitus with hyperlipidemia ([place]) 05/09/2021  ? BPH (benign prostatic hyperplasia) 05/09/2021  ? Anxiety 05/09/2021  ? Depression 05/09/2021  ? Loculated pleural effusion 05/03/2021  ? Multiple closed fractures of ribs of right side   ? Recurrent pleural effusion 04/24/2021  ? Orthostatic hypotension 04/24/2021  ? Transaminitis 04/24/2021  ? Hypothyroidism 04/24/2021  ? Acute respiratory failure with hypoxia ([place]) 04/24/2021  ? Episode of unresponsiveness  04/24/2021  ? Chronic diastolic (congestive) heart failure ([place]) 10/10/2019  ? S/P mitral valve clip implantation 10/09/2019  ? Mitral prolapse 03/16/2014  ? Nonrheumatic mitral (valve) insufficiency 03/16/2014  ?  Bilateral carotid artery disease ([place]) 03/16/2014  ? Essential hypertension 03/16/2014  ? Hyperlipidemia 03/16/2014  ? Erectile dysfunction 03/16/2014  ? ?PCP:  Merrilee Seashore, MD ?Pharmacy:   ?[facility] [address], [place] - [address] ?[address] ?[place] [address] ?Phone: [phone] Fax: [phone] ? ? ?Readmission Risk Interventions ? ?  08/21/2021  ? 12:31 PM  ?Readmission Risk Prevention Plan  ?Transportation Screening Complete  ?PCP or Specialist Appt within 3-5 Days Complete  ?Palliative Care Screening Complete  ? ? ? ?

## 2021-08-21 NOTE — Progress Notes (Signed)
? ?                                                                                                                                                     ?                                                   ?  Daily Progress Note  ? ?Patient Name: Adrian Olson       Date: 08/21/2021 ?DOB: 05-Jun-1930  Age: 88 y.o. MRN#: [id number] ?Attending Physician: Aline August, MD ?Primary Care Physician: Merrilee Seashore, MD ?Admit Date: 08/18/2021 ? ?Reason for Consultation/Follow-up: Establishing goals of care ? ?Subjective: ?I saw and examined Adrian Olson today.  Son and grandson were at the bedside as well. ? ?We reviewed conversation from yesterday including options for support whenever he discharges from the hospital.  He tells me he is looking forward to getting out of the hospital soon. ? ?Briefly reviewed again options being home with home health versus home with hospice support with recommendation for consideration for home hospice. ? ?He tells me he is continuing to consider and discussed with his daughter.  I asked him to let us know whenever he made a decision so we can start to arrange plans for time of discharge. ? ?Total time: 30 minutes ? ?Micheline Rough, MD ?[facility] Team ?[phone] ? ?

## 2021-08-22 ENCOUNTER — Telehealth: Payer: Self-pay | Admitting: Pulmonary Disease

## 2021-08-22 DIAGNOSIS — Z7189 Other specified counseling: Secondary | ICD-10-CM | POA: Diagnosis not present

## 2021-08-22 DIAGNOSIS — R6520 Severe sepsis without septic shock: Secondary | ICD-10-CM | POA: Diagnosis not present

## 2021-08-22 DIAGNOSIS — I5032 Chronic diastolic (congestive) heart failure: Secondary | ICD-10-CM | POA: Diagnosis not present

## 2021-08-22 DIAGNOSIS — A419 Sepsis, unspecified organism: Secondary | ICD-10-CM | POA: Diagnosis not present

## 2021-08-22 DIAGNOSIS — N3000 Acute cystitis without hematuria: Secondary | ICD-10-CM | POA: Diagnosis not present

## 2021-08-22 DIAGNOSIS — R531 Weakness: Secondary | ICD-10-CM | POA: Diagnosis not present

## 2021-08-22 DIAGNOSIS — Z515 Encounter for palliative care: Secondary | ICD-10-CM | POA: Diagnosis not present

## 2021-08-22 LAB — GLUCOSE, CAPILLARY
Glucose-Capillary: 148 mg/dL — ABNORMAL HIGH (ref 70–99)
Glucose-Capillary: 206 mg/dL — ABNORMAL HIGH (ref 70–99)

## 2021-08-22 LAB — BASIC METABOLIC PANEL
Anion gap: 8 (ref 5–15)
BUN: 28 mg/dL — ABNORMAL HIGH (ref 8–23)
CO2: 22 mmol/L (ref 22–32)
Calcium: 8.5 mg/dL — ABNORMAL LOW (ref 8.9–10.3)
Chloride: 103 mmol/L (ref 98–111)
Creatinine, Ser: 0.91 mg/dL (ref 0.61–1.24)
GFR, Estimated: >60 mL/min (ref >60)
Glucose, Bld: 134 mg/dL — ABNORMAL HIGH (ref 70–99)
Potassium: 4.2 mmol/L (ref 3.5–5.1)
Sodium: 133 mmol/L — ABNORMAL LOW (ref 135–145)

## 2021-08-22 LAB — MAGNESIUM: Magnesium: 1.9 mg/dL (ref 1.7–2.4)

## 2021-08-22 LAB — METHYLMALONIC ACID, SERUM: Methylmalonic Acid, Quantitative: 145 nmol/L (ref 0–378)

## 2021-08-22 MED ORDER — CEPHALEXIN 500 MG PO CAPS
500.0000 mg | ORAL_CAPSULE | Freq: Three times a day (TID) | ORAL | 0 refills | Status: AC
Start: 2021-08-22 — End: 2021-08-25

## 2021-08-22 MED ORDER — FLUTICASONE PROPIONATE 50 MCG/ACT NA SUSP: 2.0000 | Freq: Every day | NASAL | Status: AC | PRN

## 2021-08-22 MED ORDER — ALPRAZOLAM 0.25 MG PO TABS: 0.2500 mg | ORAL_TABLET | Freq: Three times a day (TID) | ORAL | 0 refills | Status: AC | PRN

## 2021-08-22 NOTE — Consult Note (Signed)
[facility] CM Inpatient Consult ? ? ?08/22/2021 ? ?Adrian Olson ?04-Jul-1930 ?[id number] ? ?[facility] CM) ?  ?Patient was reviewed for less than 30 days unplanned readmission. Assessed for post hospital chronic disease management and care coordination needs with [facility] CM services. ? ?Per review, patient's primary provider office has chronic care management team and program. Spoke with patient and daughter bedside. Explained the chronic care management program as outpatient service. Patient states that he has worked with [facility] RN case manager in past. Appointment reminder card provided for post hospital follow up. ? ?Plan: Will continue to follow.  ? ?Of note, [facility] Care Management services does not replace or interfere with any services that are arranged by inpatient case management or social work.  ? ?Netta Cedars, MSN, RN ?[facility] Liaison ?Phone [phone] ?Toll free office [phone]   ? ?

## 2021-08-22 NOTE — TOC Transition Note (Signed)
Transition of Care (TOC) - CM/SW Discharge Note ? ? ?Patient Details  ?Name: Adrian Olson ?MRN: [id number] ?Date of Birth: Nov 12, 1930 ? ?Transition of Care (TOC) CM/SW Contact:  ?Thoren Hosang, LCSW ?Phone Number: ?08/22/2021, 1:45 PM ? ? ?Clinical Narrative:    ?Met with pt and daughter this morning to further discuss dc plans. They are both now agreed that they would like to plan home with Hospice care and have chosen [facility] for these services.  [place] liaison has met with them and they are ready to begin services when pt dc'd.  MD has cleared for dc home today. No further TOC needs. ? ? ?Final next level of care: Helen ?Barriers to Discharge: Barriers Resolved ? ? ?Patient Goals and CMS Choice ?Patient states their goals for this hospitalization and ongoing recovery are:: be at home ?  ?  ? ?Discharge Placement ?  ?           ?  ?  ?  ?  ? ?Discharge Plan and Services ?  ?  ?           ?DME Arranged: N/A ?DME Agency: NA ?  ?  ?  ?HH Arranged: Therapist, sports, Social Work ?[place] Agency: [facility] ?Date HH Agency Contacted: 08/22/21 ?  ?Representative spoke with at [place]: Velta Addison ? ?Social Determinants of Health (SDOH) Interventions ?  ? ? ?Readmission Risk Interventions ? ?  08/21/2021  ? 12:31 PM  ?Readmission Risk Prevention Plan  ?Transportation Screening Complete  ?PCP or Specialist Appt within 3-5 Days Complete  ?Palliative Care Screening Complete  ? ? ? ? ? ?

## 2021-08-22 NOTE — Progress Notes (Signed)
? ?                                                                                                                                                     ?                                                   ?  Daily Progress Note  ? ?Patient Name: Adrian Olson       Date: 08/22/2021 ?DOB: [birth year]  Age: 88 y.o. MRN#: [id number] ?Attending Physician: No att. providers found ?Primary Care Physician: Merrilee Seashore, MD ?Admit Date: 08/18/2021 ? ?Reason for Consultation/Follow-up: Establishing goals of care ? ?Subjective: ?I saw and examined Adrian Olson today.  No family present at bedside. ? ?Discussed plan for discharge home today.  He tells me that he and his family have been discussing about home hospice likely being the best option for his care moving forward.  At the same time, he states that he thinks he may want to go home and discuss with his wife in person prior to making a final decision.  We talked again about the benefits of home hospice versus benefits of home health and how to look at them when determining which is the best option for his long-term goal of being at home and staying at home. ? ?He tells me that he is hoping someone will take him for ride around town in a car later today to help "clear my mind and decide what to do." ? ?He asked me to call his daughter to discuss as well. ? ?I then called was able to reach his daughter, Adrian Olson.  We reviewed above conversation and talked again about home hospice benefits and how these are most likely to line up with his stated goal being at home and staying out of the hospital.  We discussed 24-hour coverage with hospice and how this would be more beneficial to his overall goals and home health which is not providing round-the-clock coverage when acute needs arise at home. ? ?She expressed that she thinks that home hospice would be his best bet, but " wish they would call that something else" as he is worried about the connotation of hospice means that he  is going to die soon. ? ?Discussed with TOC will reach out to family to confirm selections for home health versus home hospice.  Likely discharge today. ? ?Total time: 55 minutes ? ?Micheline Rough, MD ?[facility] Team ?[phone] ? ?

## 2021-08-22 NOTE — Discharge Summary (Addendum)
Physician Discharge Summary  ?DUKE WEISENSEL GYK:[id number] DOB: Dec 25, 1930 DOA: 08/18/2021 ? ?PCP: Merrilee Seashore, MD ? ?Admit date: 08/18/2021 ?Discharge date: 08/22/2021 ? ?Admitted From: Home ?Disposition: Home ? ?Recommendations for Outpatient Follow-up:  ?Follow up with home hospice at earliest convenience ? ? ?Home Health: Home hospice ?Equipment/Devices: None ? ?Discharge Condition: Poor  ?CODE STATUS: DNR ?Diet recommendation: Heart healthy/fluid restriction of up to 1500 cc a day ? ?Brief/Interim Summary: ?88 y.o. male with medical history significant for moderate to severe mitral regurgitation following MitraClip in May 7939, chronic diastolic heart failure, essential pretension, paroxysmal SVT, type 2 diabetes mellitus, BPH, depression, chronic hyponatremia, recent hospitalization from 08/08/2021-08/14/2021 for acute on chronic diastolic heart failure requiring IV Lasix and subsequent oral Lasix as per cardiology recommendations presented with dysuria and low blood pressure.  During outpatient follow-up with Dr. Smith/cardiology on 08/16/2021, he had recommended palliative care evaluation for consideration of possible hospice because of severely limited treatment options given patient's persistent hypotension.  On presentation, patient was intermittently tachycardic with concern for atrial flutter versus SVT.  This was treated with IV amiodarone with subsequent improvement in blood pressure.  Sodium was 128, high-sensitivity troponin was 118 and 112 and 99.  Initial lactate was 2.9, subsequent lactate was 5.6.  UA was suggestive for UTI.  COVID-19/influenza testing were negative.  He was started on IV fluids and antibiotics.  Chest x-ray showed small residual pleural effusions without overt infiltrate, edema or pneumothorax.  CT abdomen/pelvis without contrast showed no ureteral stones nor any evidence of hydronephrosis, ultrasound diverticulosis without diverticulitis and small bilateral pleural effusions  with basilar atelectasis versus infiltrate.  Cardiology and palliative care consulted.  During the hospitalization, his condition has slightly improved.  Palliative care has had discussions with patient and family. Cardiology recommended palliative care discussions.  Cultures have remained negative so far.  Patient really wants to go home today.  He will be discharged home on oral Keflex.  Patient/family agreeable for home hospice.  He will be discharged to home once arrangements have been made. ?Discharge Diagnoses:  ? ?Severe sepsis: Present on admission ?UTI: Present on admission ?Lactic acidosis ?-Off IV fluids.  Cultures negative so far.  COVID-19/influenza testing negative. ?-Procalcitonin less than 0.1.  Doubt that patient has pneumonia. ?-Currently on Rocephin. ?-Sepsis has resolved ?-Discharged home on oral Keflex for 3 more days. ? ?Chronic diastolic heart failure ?Acute on chronic hypotension ?History of hypertension ?-Patient was recently hospitalized for acute on chronic diastolic heart failure and discharged on Lasix 40 mg on 1 day and 20 mg on the following day as per cardiology recommendations.  Lasix on hold because of worsening hypotension on presentation. ?-Continue diet and fluid restriction continue metoprolol ?-Cardiology evaluate the patient inpatient as well and recommended palliative care follow-up.  As per recent cardiology evaluation: Dr. Tamala Julian had recommended palliative care consultation because of severely limited treatment options given patient's persistent hypotension ?-Palliative care following: Patient/family agreeable for home hospice.  We will add low-dose Xanax as needed for anxiety on discharge. ?-We will resume Lasix on discharge if patient tolerates it.  Blood pressure currently stable.  We will keep ramipril on hold ?  ?Status post MitraClip ?-Has moderate mitral regurgitation on most recent echocardiogram.  Patient is not a candidate for another procedure apparently. ?   ?History of PSVT ?-Continue Lopressor.  Patient was tachycardic on presentation and had to be given IV amiodarone for concern for PSVT versus atrial flutter as per cardiology recommendations. ?-Outpatient follow-up with cardiology. ? ?  Hyponatremia, chronic ?-Improving.  Sodium 133  today. Outpatient follow-up  ? ?Hyperlipidemia--continue statin ? ?Hypothyroidism--continue levothyroxine ? ?BPH--continue finasteride.  Flomax was discontinued again on 08/20/2021 because of episodes of hypotension.  We will keep Flomax on hold on discharge. ?  ?Elevated LFTs ?-Might be secondary to congestive hepatopathy.  Outpatient follow-up. ? ?Diabetes mellitus type 2 ?-Metformin on hold.  Patient metformin discharge.  Carb modified diet. ? ?Depression ?-Resume his Zoloft on discharge ?  ?Prolonged Qtc ?-Presenting EKG showed QTc of 504.  ?  ?Physical deconditioning/generalized weakness ?-PT recommends home health PT ?  ?Severe malnutrition--follow nutrition recommendations ? ?Discharge Instructions ? ?Discharge Instructions   ? ? Ambulatory referral to Cardiology   Complete by: As directed ?  ? Diet - low sodium heart healthy   Complete by: As directed ?  ? Diet Carb Modified   Complete by: As directed ?  ? Increase activity slowly   Complete by: As directed ?  ? No wound care   Complete by: As directed ?  ? ?  ? ?Allergies as of 08/22/2021   ? ?   Reactions  ? Duloxetine Hcl Other (See Comments)  ? withdrawal  ? ?  ? ?  ?Medication List  ?  ? ?STOP taking these medications   ? ?ramipril 5 MG capsule ?Commonly known as: ALTACE ?  ?tamsulosin 0.4 MG Caps capsule ?Commonly known as: FLOMAX ?  ? ?  ? ?TAKE these medications   ? ?albuterol 108 (90 Base) MCG/ACT inhaler ?Commonly known as: VENTOLIN HFA ?Inhale 2 puffs into the lungs every 6 (six) hours as needed for wheezing or shortness of breath. ?  ?ALPRAZolam 0.25 MG tablet ?Commonly known as: Xanax ?Take 1 tablet (0.25 mg total) by mouth 3 (three) times daily as needed for anxiety. ?   ?aspirin 81 MG EC tablet ?Take 81 mg by mouth daily. ?  ?Bevespi Aerosphere 9-4.8 MCG/ACT Aero ?Generic drug: Glycopyrrolate-Formoterol ?Inhale 2 puffs into the lungs 2 (two) times daily. ?  ?cephALEXin 500 MG capsule ?Commonly known as: KEFLEX ?Take 1 capsule (500 mg total) by mouth 3 (three) times daily for 3 days. ?  ?cetirizine 10 MG tablet ?Commonly known as: ZyrTEC Allergy ?Take 1 tablet (10 mg total) by mouth daily. ?  ?finasteride 5 MG tablet ?Commonly known as: PROSCAR ?Take 5 mg by mouth daily at 12 noon. ?  ?fluticasone 50 MCG/ACT nasal spray ?Commonly known as: FLONASE ?Place 2 sprays into both nostrils daily as needed for allergies. ?  ?furosemide 20 MG tablet ?Commonly known as: LASIX ?Take one tablet by mouth every other day, alternating with '40mg'$  once daily every other day ?  ?levothyroxine 100 MCG tablet ?Commonly known as: SYNTHROID ?Take 1 tablet by mouth daily. ?  ?metFORMIN 500 MG tablet ?Commonly known as: GLUCOPHAGE ?Take 500 mg by mouth every evening. ?  ?metoprolol tartrate 25 MG tablet ?Commonly known as: LOPRESSOR ?Take 0.5 tablets (12.5 mg total) by mouth 2 (two) times daily. ?  ?OneTouch Verio test strip ?Generic drug: glucose blood ?See admin instructions. ?  ?potassium chloride SA 20 MEQ tablet ?Commonly known as: KLOR-CON M ?Take 0.5 tablets (10 mEq total) by mouth every Monday, Wednesday, and Friday. ?  ?pravastatin 20 MG tablet ?Commonly known as: PRAVACHOL ?Take 20 mg by mouth at bedtime. ?  ?sertraline 50 MG tablet ?Commonly known as: ZOLOFT ?Take 50 mg by mouth daily. ?  ?VITAMIN B1 PO ?Take 125 mcg by mouth daily. ?  ?Vitamin D 50 MCG (2000  UT) tablet ?Take 2,000 Units by mouth daily at 12 noon. ?  ? ?  ? ? ? ? ?Allergies  ?Allergen Reactions  ? Duloxetine Hcl Other (See Comments)  ?  withdrawal  ? ? ?Consultations: ?Cardiology/palliative care ? ? ?Procedures/Studies: ?CT ABDOMEN PELVIS WO CONTRAST ? ?Result Date: 08/18/2021 ?CLINICAL DATA:  Hematuria, diarrhea EXAM: CT ABDOMEN  AND PELVIS WITHOUT CONTRAST TECHNIQUE: Multidetector CT imaging of the abdomen and pelvis was performed following the standard protocol without IV contrast. RADIATION DOSE REDUCTION: This exam was performed ac

## 2021-08-22 NOTE — Telephone Encounter (Signed)
"  pt's daughter called pt is having heart issues was admitted to the hospital. she request to cancel pt's PFT,office visit, and HSTdon't think pt needs to see pulm MD.request pls send msg to Dr Freda Jackson to confirm please advise" ? ?JD, please advise ?

## 2021-08-22 NOTE — Progress Notes (Signed)
Discharge package printed and instructions given to daughter. No question.  ?

## 2021-08-22 NOTE — Progress Notes (Signed)
[facility]) Hospital Liaison: RN note    ? ?Notified by Transition of Care Manger of patient/family request for [facility] services at home after discharge. Chart and patient information under review by [facility] physician. Hospice eligibility pending currently.    ? ?Writer spoke with patient and his daughter  to initiate education related to hospice philosophy, services and team approach to care. Both  verbalized understanding of information given. Per discussion, plan is for discharge to home by private car.   Please send signed and completed DNR form home with patient/family. Patient will need prescriptions for discharge comfort medications.     ? ?DME needs have been discussed, patient currently has the following equipment in the home: walker, toilet seat lift.  Patient/family requests the following DME for delivery to the home:  none. [facility] will evaluate needs on admission visit.     ? ?[facility] aware of the above. Please notify [facility] when patient is ready to leave the unit at discharge. (Call [phone] or [phone] after 5pm.) [facility] information and contact numbers given to  Daughter, [name].      ? ?A Please do not hesitate to call with questions.    ? ?Thank you,    ? ?Farrel Gordon, RN, CCM       ? ?[facility] Hospital Liaison    ? ?[phone]  ?

## 2021-08-22 NOTE — Plan of Care (Signed)
  Problem: Activity: Goal: Risk for activity intolerance will decrease Outcome: Progressing   Problem: Pain Managment: Goal: General experience of comfort will improve Outcome: Progressing   Problem: Safety: Goal: Ability to remain free from injury will improve Outcome: Progressing   

## 2021-08-22 NOTE — Progress Notes (Signed)
Physical Therapy Treatment ?Patient Details ?Name: SEVON ROTERT ?MRN: [id number] ?DOB: Apr 12, 1931 ?Today's Date: 08/22/2021 ? ? ?History of Present Illness Pt is 88 yo male admitted with hyotension and sepsis 2* UTI. Pt with hx including mitral regurgitation despite clipping, DM, HTN, lumbar disc disease ? ?  ?PT Comments  ? ? Pt is AxO 3 very pleasant laying in bed with daughter at bedside.  Assisted OOB to amb went well. General bed mobility comments: increased time with min assist to bring trunk to upright.  General Gait Details: min guard for safety and cues for RW proximity; pt ambulates with kyphotic posture with mildly flexed knees;  Tolerated amb twice with one seated rest break. Positioned in recliner with multiple pillows.  ?Pt plans to D/C to home with family.    ?Recommendations for follow up therapy are one component of a multi-disciplinary discharge planning process, led by the attending physician.  Recommendations may be updated based on patient status, additional functional criteria and insurance authorization. ? ?Follow Up Recommendations ? Home health PT (Hospice) ?  ?  ?Assistance Recommended at Discharge Intermittent Supervision/Assistance  ?Patient can return home with the following A little help with walking and/or transfers;A little help with bathing/dressing/bathroom;Assistance with cooking/housework;Help with stairs or ramp for entrance ?  ?Equipment Recommendations ? None recommended by PT  ?  ?Recommendations for Other Services   ? ? ?  ?Precautions / Restrictions Precautions ?Precautions: Fall ?Restrictions ?Weight Bearing Restrictions: No  ?  ? ?Mobility ? Bed Mobility ?Overal bed mobility: Needs Assistance ?Bed Mobility: Supine to Sit ?  ?  ?Supine to sit: Min guard, Supervision ?  ?  ?General bed mobility comments: increased time with min assist to bring trunk to upright ?  ? ?Transfers ?Overall transfer level: Needs assistance ?Equipment used: Rolling walker (2 wheels) ?Transfers: Sit  to/from Stand ?Sit to Stand: Min guard, Supervision ?  ?  ?  ?  ?  ?General transfer comment: min guard for safety; ?  ? ?Ambulation/Gait ?Ambulation/Gait assistance: Supervision, Min guard ?Gait Distance (Feet): 110 Feet ?Assistive device: Rolling walker (2 wheels) ?Gait Pattern/deviations: Step-through pattern, Decreased stride length ?Gait velocity: decreased ?  ?  ?General Gait Details: min guard for safety and cues for RW proximity; pt ambulates with kyphotic posture with mildly flexed knees;  Tolerated amb twice with one seated rest break. ? ? ?Stairs ?  ?  ?  ?  ?  ? ? ?Wheelchair Mobility ?  ? ?Modified Rankin (Stroke Patients Only) ?  ? ? ?  ?Balance   ?  ?  ?  ?  ?  ?  ?  ?  ?  ?  ?  ?  ?  ?  ?  ?  ?  ?  ?  ? ?  ?Cognition Arousal/Alertness: Awake/alert ?Behavior During Therapy: [facility] for tasks assessed/performed ?Overall Cognitive Status: Within Functional Limits for tasks assessed ?  ?  ?  ?  ?  ?  ?  ?  ?  ?  ?  ?  ?  ?  ?  ?  ?General Comments: AxO x 3 very pleasant ?  ?  ? ?  ?Exercises   ? ?  ?General Comments   ?  ?  ? ?Pertinent Vitals/Pain    ? ? ?Home Living   ?  ?  ?  ?  ?  ?  ?  ?  ?  ?   ?  ?Prior Function    ?  ?  ?   ? ?  PT Goals (current goals can now be found in the care plan section) Progress towards PT goals: Progressing toward goals ? ?  ?Frequency ? ? ? Min 3X/week ? ? ? ?  ?PT Plan Current plan remains appropriate  ? ? ?Co-evaluation   ?  ?  ?  ?  ? ?  ?AM-PAC PT "6 Clicks" Mobility   ?Outcome Measure ? Help needed turning from your back to your side while in a flat bed without using bedrails?: A Little ?Help needed moving from lying on your back to sitting on the side of a flat bed without using bedrails?: A Little ?Help needed moving to and from a bed to a chair (including a wheelchair)?: A Little ?Help needed standing up from a chair using your arms (e.g., wheelchair or bedside chair)?: A Little ?Help needed to walk in hospital room?: A Little ?Help needed climbing 3-5 steps with a  railing? : A Little ?6 Click Score: 18 ? ?  ?End of Session Equipment Utilized During Treatment: Gait belt ?Activity Tolerance: Patient tolerated treatment well;Patient limited by fatigue ?Patient left: in chair;with call bell/phone within reach;with chair alarm set ?Nurse Communication: Mobility status ?PT Visit Diagnosis: Other abnormalities of gait and mobility (R26.89);Muscle weakness (generalized) (M62.81) ?  ? ? ?Time: 6606-3016 ?PT Time Calculation (min) (ACUTE ONLY): 26 min ? ?Charges:  $Gait Training: 23-37 mins          ?          ?Rica Koyanagi  PTA ?Acute  Rehabilitation Services ?Pager      [phone] ?Office      [phone] ? ?

## 2021-08-23 LAB — CULTURE, BLOOD (ROUTINE X 2)
Culture: NO GROWTH
Culture: NO GROWTH
Special Requests: ADEQUATE

## 2021-08-23 NOTE — Telephone Encounter (Signed)
Called and spoke with patient. She verbalized understanding.   Nothing further needed.  

## 2021-08-23 NOTE — Telephone Encounter (Signed)
I have received the message. I think that is reasonable to not have further follow up in our clinic if they are discussing palliative care and hospice care options.  ? ?I am sorry that her father has not gotten better over the past few months that I have been working with them. ? ?Thanks, ?JD ?

## 2021-08-29 ENCOUNTER — Ambulatory Visit: Payer: Medicare HMO | Admitting: Pulmonary Disease

## 2021-09-08 DIAGNOSIS — I051 Rheumatic mitral insufficiency: Secondary | ICD-10-CM | POA: Diagnosis not present

## 2021-09-08 DIAGNOSIS — I110 Hypertensive heart disease with heart failure: Secondary | ICD-10-CM | POA: Diagnosis not present

## 2021-09-08 DIAGNOSIS — I5033 Acute on chronic diastolic (congestive) heart failure: Secondary | ICD-10-CM | POA: Diagnosis not present

## 2021-09-08 DIAGNOSIS — I471 Supraventricular tachycardia: Secondary | ICD-10-CM | POA: Diagnosis not present

## 2021-09-12 ENCOUNTER — Ambulatory Visit: Payer: Medicare HMO | Admitting: Interventional Cardiology

## 2021-09-13 DIAGNOSIS — N390 Urinary tract infection, site not specified: Secondary | ICD-10-CM | POA: Diagnosis not present

## 2021-10-13 DEATH — deceased

## 2022-03-27 IMAGING — CT CT ABD-PELV W/ CM
2 of 5 series · 14 of 46 positions shown, 16 images · IV contrast (APPLIED)
Comparison: CT abdomen/pelvis 04/24/2021

CLINICAL DATA: Constipation

EXAM:
CT ABDOMEN AND PELVIS WITH CONTRAST
TECHNIQUE: Multidetector CT imaging of the abdomen and pelvis was performed
using the standard protocol following bolus administration of
intravenous contrast.

[Series 2: abd pel w · axial · 0.69mm/px · z∈[-320,+100]mm · 11 of 94 slices shown, 13 images]
[im 5/94  soft-tissue]
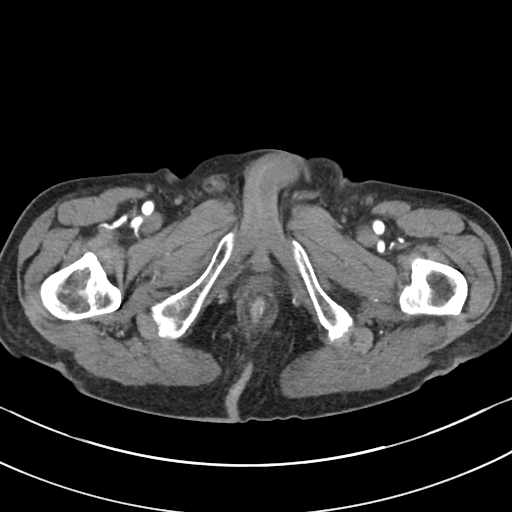
[im 5/94  bone]
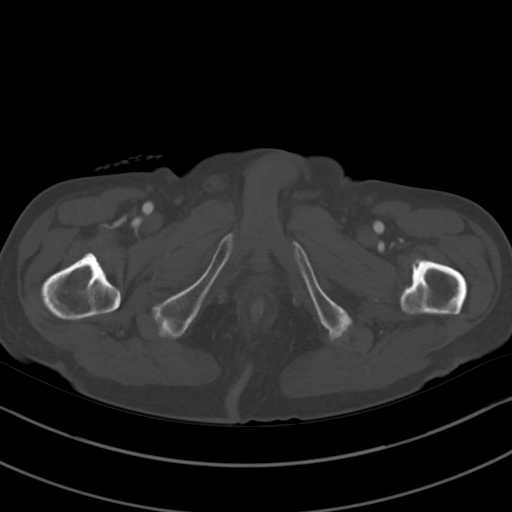
[im 14/94  soft-tissue]
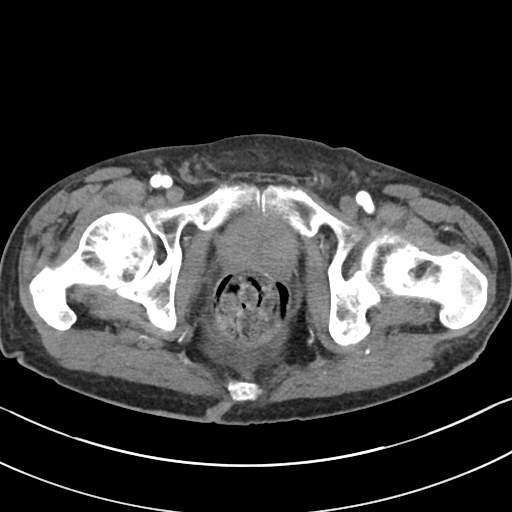
[im 24/94  soft-tissue]
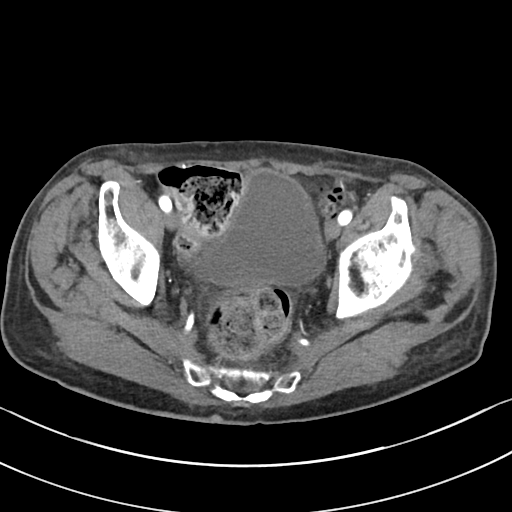
[im 33/94  soft-tissue]
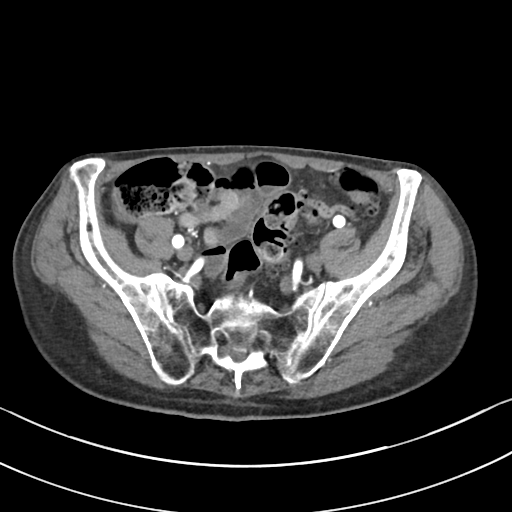
[im 38/94  soft-tissue]
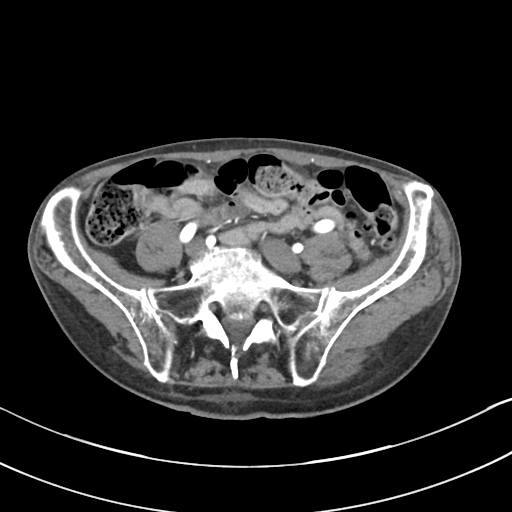
[im 47/94  soft-tissue]
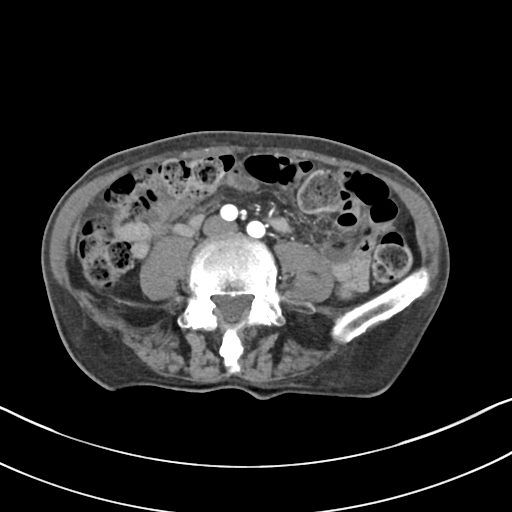
[im 56/94  soft-tissue]
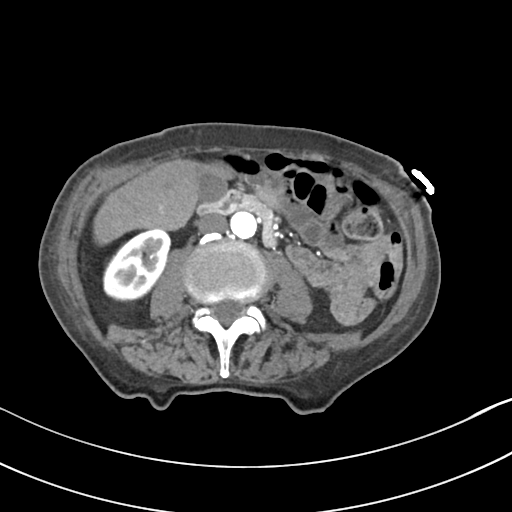
[im 61/94  soft-tissue]
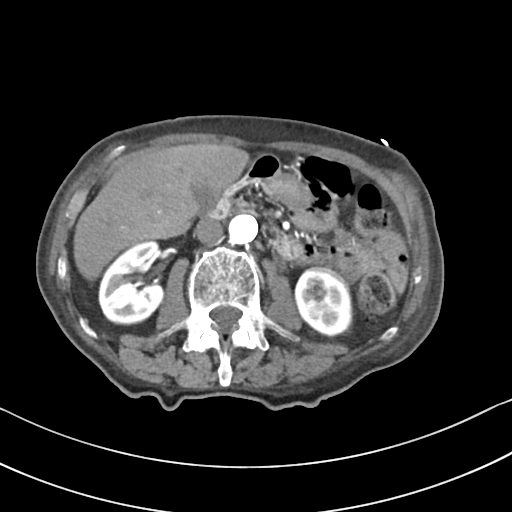
[im 70/94  soft-tissue]
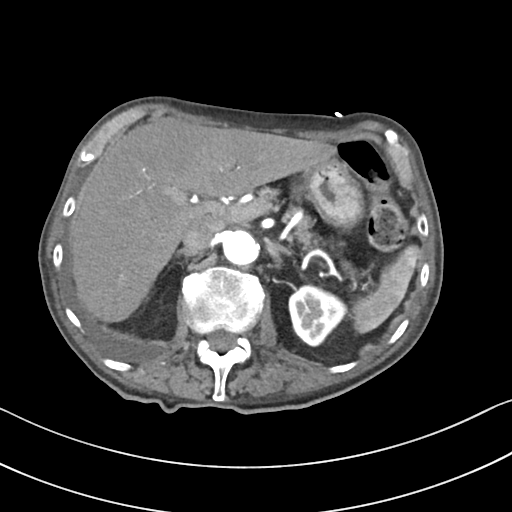
[im 70/94  bone]
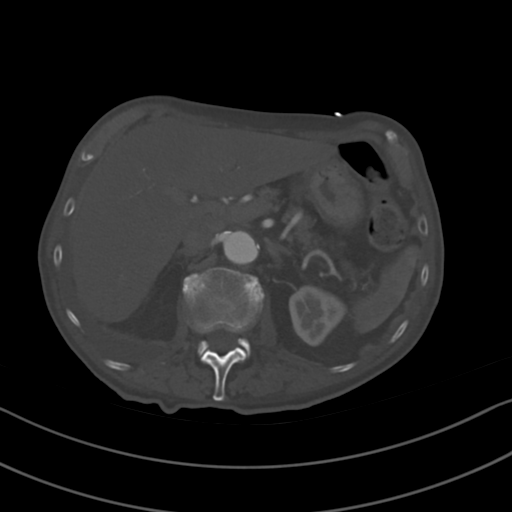
[im 80/94  soft-tissue]
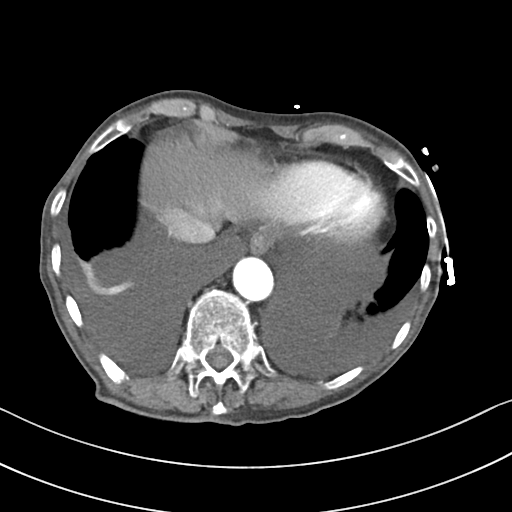
[im 89/94  soft-tissue]
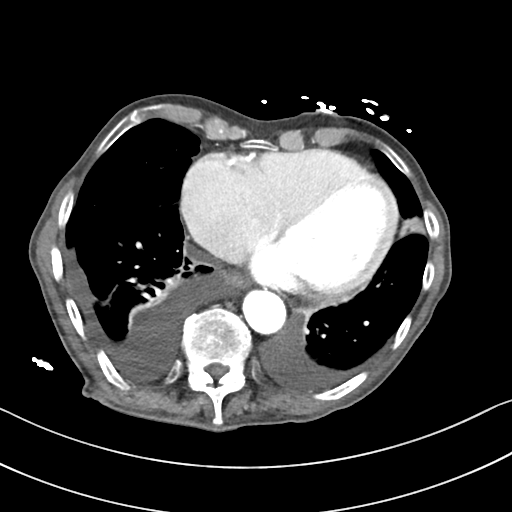

[Series 5: coronal · coronal · 0.66mm/px · 3 of 74 slices shown]
[im 25/74  soft-tissue]
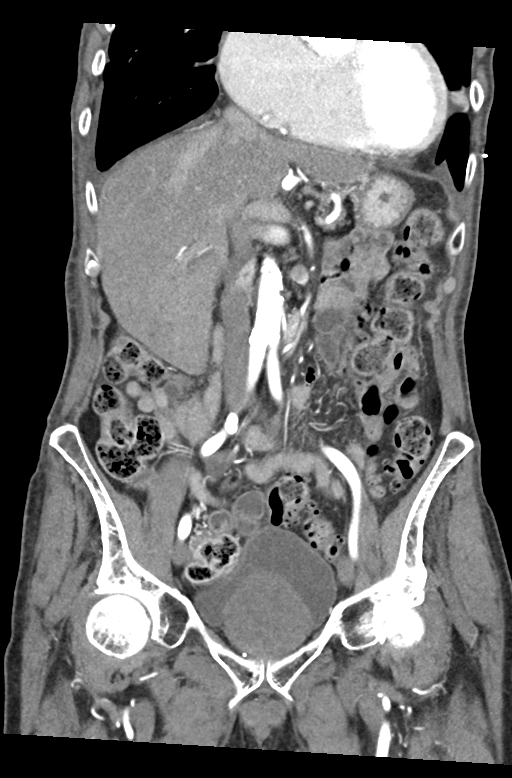
[im 33/74  soft-tissue]
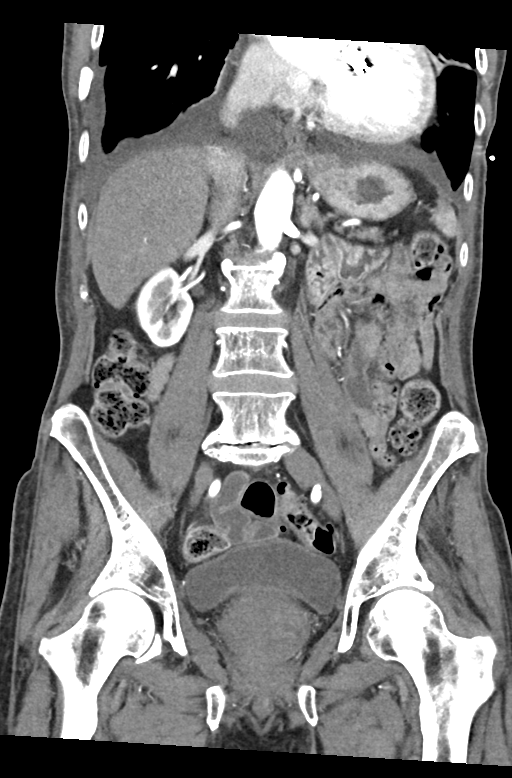
[im 41/74  soft-tissue]
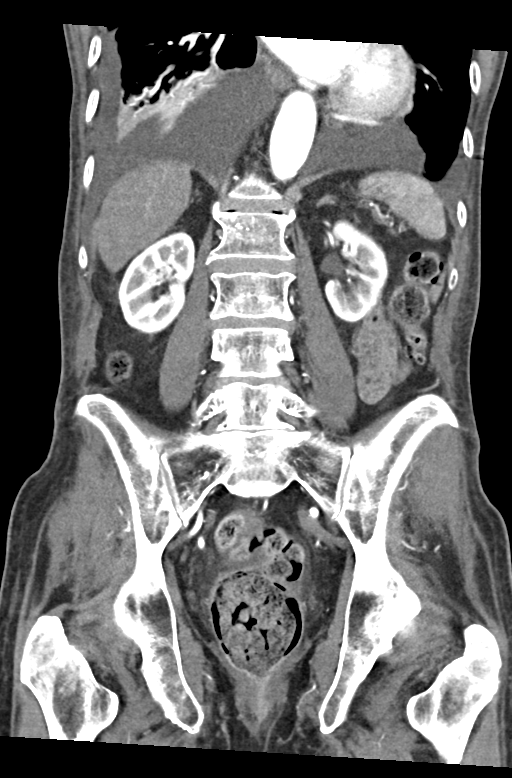

[14 of 46 positions shown; findings below may reference images not displayed]

RADIATION DOSE REDUCTION: This exam was performed according to the
departmental dose-optimization program which includes automated
exposure control, adjustment of the mA and/or kV according to
patient size and/or use of iterative reconstruction technique.

CONTRAST:  100mL OMNIPAQUE IOHEXOL 300 MG/ML  SOLN
FINDINGS: Lower chest: There are bilateral pleural effusions with adjacent
atelectasis, slightly decreased in size since the prior CT from
04/24/2021. The heart is enlarged. A mitral valve prosthesis is
noted.

Hepatobiliary: The liver and gallbladder are unremarkable. There is
no biliary ductal dilatation.

Pancreas: Unremarkable.

Spleen: Unremarkable.

Adrenals/Urinary Tract: The adrenals are unremarkable.

The kidneys are unremarkable, with no focal lesion, stone,
hydronephrosis, or hydroureter. There is symmetric excretion of
contrast into the collecting systems on the delayed images. The
bladder is unremarkable.

Stomach/Bowel: The stomach is unremarkable. There is a large stool
burden in the rectum with moderate stool throughout the remainder of
the colon. There are no findings to suggest definite stercoral
colitis. There is colonic diverticulosis without evidence of acute
diverticulitis. Appendix is not definitively identified.

Vascular/Lymphatic: There is scattered calcified atherosclerotic
plaque throughout the nonaneurysmal abdominal aorta. The major
branch vessels are patent. The main portal and splenic veins are
patent.

Reproductive: Prostate is markedly enlarged and impresses upon the
inferior aspect of the bladder.

Other: There is trace free fluid in the presacral space. There is no
evidence of organized or drainable fluid collection. There is no
free intraperitoneal air.

Musculoskeletal: Mildly displaced fractures of the right eighth
through eleventh ribs are unchanged, with 2 separate fractures of
the ninth through eleventh ribs identified. There is no new acute
osseous abnormality. There is no aggressive osseous lesion.
IMPRESSION: 1. Large stool burden in the rectum with moderate stool throughout
the remainder of the colon without findings to suggest definite
stercoral colitis.
2. Bilateral pleural effusions with adjacent atelectasis, slightly
decreased in size since the prior CT from 04/24/2021.
[DATE]. Colonic diverticulosis without evidence of acute diverticulitis.
4. Trace free fluid in the presacral space, nonspecific.
5. Unchanged mildly displaced fractures of the right eighth through
eleventh ribs.
6. Marked prostatomegaly.
7. Cardiomegaly.

Aortic Atherosclerosis ([id number]).

## 2022-03-27 IMAGING — DX DG CHEST 1V PORT
1 series · 1 of 1 positions shown · non-contrast
Comparison: 05/18/2021.

CLINICAL DATA: Pts family states patient has had constipation since
[DATE] days ago. Pt states it has involved his urination as
well,to the point he has hard time voiding. Pt also then states he
has been short of breath for past few days. Pt 's baseline since
[REDACTED] has been short of breath, difficult to ascertain from
family if has worsened .

EXAM:
PORTABLE CHEST 1 VIEW

[chest]
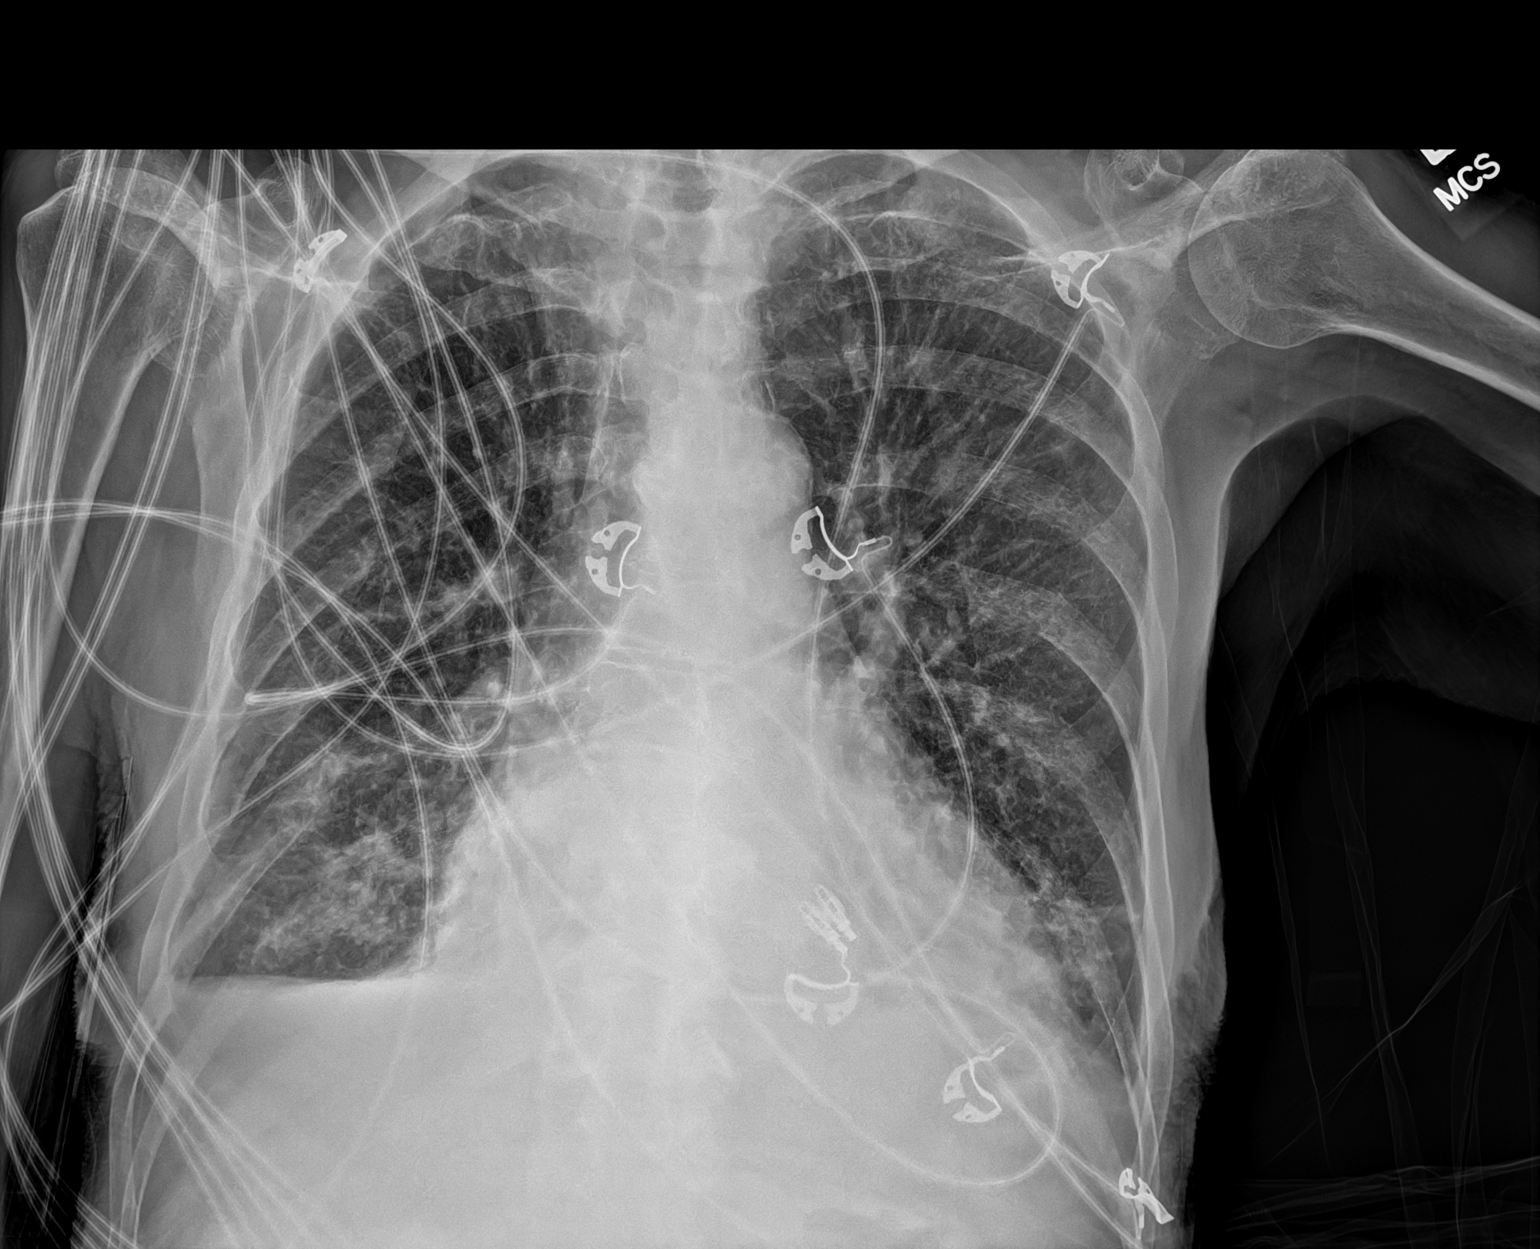

[1 of 1 positions shown; findings below may reference images not displayed]

FINDINGS: Cardiac silhouette borderline enlarged. No mediastinal or hilar
masses.

Lungs are hyperexpanded. Interstitial thickening and
peribronchovascular opacities are noted in the lung bases increased
when compared to the most recent prior exam. Interstitial prominence
throughout the remainder of the lungs is stable.

Small effusion suspected.  No pneumothorax.

Skeletal structures grossly intact.
IMPRESSION: 1. Lung base opacities consistent with atelectasis or pneumonia,
somewhat greater on the left, increased compared to the most recent
prior chest radiograph.
2. Lung hyperexpansion and chronic interstitial thickening
suggesting underlying COPD.

## 2022-05-26 IMAGING — DX DG FOOT 2V*R*
2 series · 2 of 2 positions shown · non-contrast
Comparison: None.

CLINICAL DATA: Fifth toe pain for several months without known
injury.

EXAM:
RIGHT FOOT - 2 VIEW

[foot ap]
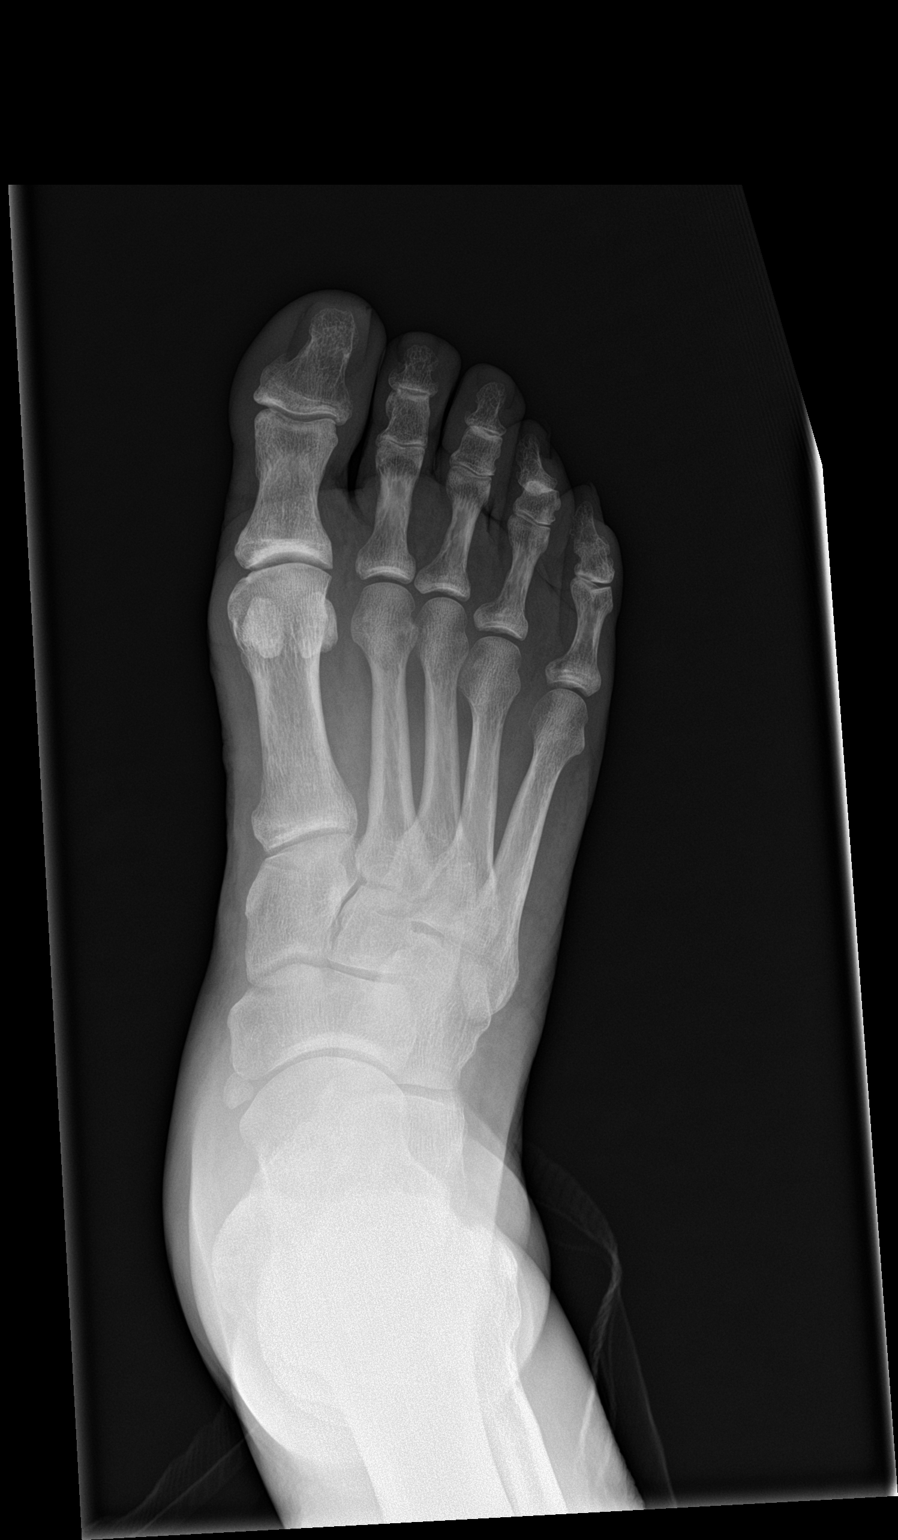

[foot lat]
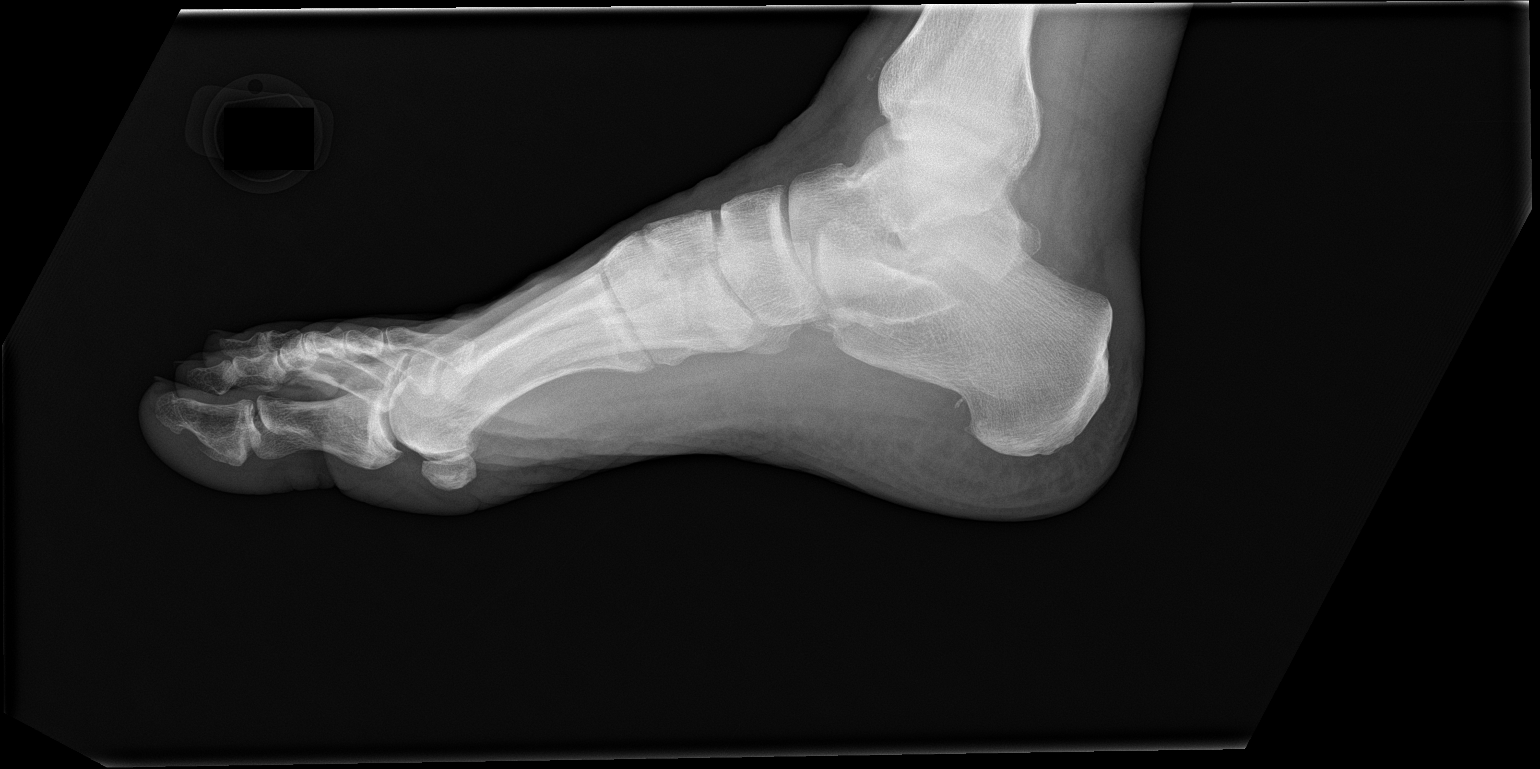

[2 of 2 positions shown; findings below may reference images not displayed]

FINDINGS: There is no evidence of fracture or dislocation. There is no
evidence of arthropathy or other focal bone abnormality. Soft
tissues are unremarkable.
IMPRESSION: Negative.
